# Patient Record
Sex: Male | Born: 1945 | Race: White | Hispanic: No | Marital: Married | Smoking: Never smoker
Health system: Southern US, Community
[De-identification: ages and names within clinical notes are randomized; demographics above are authoritative.]

## PROBLEM LIST (undated history)

## (undated) DIAGNOSIS — G4730 Sleep apnea, unspecified: Secondary | ICD-10-CM

## (undated) DIAGNOSIS — I509 Heart failure, unspecified: Secondary | ICD-10-CM

## (undated) DIAGNOSIS — J45909 Unspecified asthma, uncomplicated: Secondary | ICD-10-CM

## (undated) DIAGNOSIS — L408 Other psoriasis: Secondary | ICD-10-CM

## (undated) DIAGNOSIS — I2510 Atherosclerotic heart disease of native coronary artery without angina pectoris: Secondary | ICD-10-CM

## (undated) DIAGNOSIS — I350 Nonrheumatic aortic (valve) stenosis: Secondary | ICD-10-CM

## (undated) DIAGNOSIS — I10 Essential (primary) hypertension: Secondary | ICD-10-CM

## (undated) DIAGNOSIS — E785 Hyperlipidemia, unspecified: Secondary | ICD-10-CM

## (undated) DIAGNOSIS — E119 Type 2 diabetes mellitus without complications: Secondary | ICD-10-CM

## (undated) DIAGNOSIS — Q059 Spina bifida, unspecified: Secondary | ICD-10-CM

## (undated) DIAGNOSIS — M129 Arthropathy, unspecified: Secondary | ICD-10-CM

## (undated) DIAGNOSIS — K219 Gastro-esophageal reflux disease without esophagitis: Secondary | ICD-10-CM

## (undated) HISTORY — DX: Spina bifida, unspecified: Q05.9

## (undated) HISTORY — DX: Essential (primary) hypertension: I10

## (undated) HISTORY — DX: Type 2 diabetes mellitus without complications: E11.9

## (undated) HISTORY — DX: Arthropathy, unspecified: M12.9

## (undated) HISTORY — DX: Sleep apnea, unspecified: G47.30

## (undated) HISTORY — DX: Atherosclerotic heart disease of native coronary artery without angina pectoris: I25.10

## (undated) HISTORY — DX: Gastro-esophageal reflux disease without esophagitis: K21.9

## (undated) HISTORY — DX: Hyperlipidemia, unspecified: E78.5

## (undated) HISTORY — PX: CORONARY ARTERY BYPASS GRAFT: SHX141

## (undated) HISTORY — DX: Unspecified asthma, uncomplicated: J45.909

## (undated) HISTORY — DX: Other psoriasis: L40.8

## (undated) HISTORY — PX: JOINT REPLACEMENT: SHX530

## (undated) HISTORY — DX: Nonrheumatic aortic (valve) stenosis: I35.0

## (undated) HISTORY — PX: BACK SURGERY: SHX140

---

## 1998-03-03 ENCOUNTER — Other Ambulatory Visit: Admission: RE | Admit: 1998-03-03 | Discharge: 1998-03-03 | Payer: Self-pay | Admitting: Orthopedic Surgery

## 1998-11-09 ENCOUNTER — Ambulatory Visit (HOSPITAL_BASED_OUTPATIENT_CLINIC_OR_DEPARTMENT_OTHER): Admission: RE | Admit: 1998-11-09 | Discharge: 1998-11-09 | Payer: Self-pay | Admitting: Orthopedic Surgery

## 2001-12-16 ENCOUNTER — Inpatient Hospital Stay (HOSPITAL_COMMUNITY): Admission: AD | Admit: 2001-12-16 | Discharge: 2001-12-24 | Payer: Self-pay | Admitting: Cardiology

## 2001-12-20 ENCOUNTER — Encounter: Payer: Self-pay | Admitting: Thoracic Surgery (Cardiothoracic Vascular Surgery)

## 2001-12-21 ENCOUNTER — Encounter: Payer: Self-pay | Admitting: Thoracic Surgery (Cardiothoracic Vascular Surgery)

## 2001-12-22 ENCOUNTER — Encounter: Payer: Self-pay | Admitting: Thoracic Surgery (Cardiothoracic Vascular Surgery)

## 2002-02-04 ENCOUNTER — Encounter (HOSPITAL_COMMUNITY): Admission: RE | Admit: 2002-02-04 | Discharge: 2002-05-05 | Payer: Self-pay | Admitting: Cardiology

## 2002-11-10 ENCOUNTER — Encounter: Payer: Self-pay | Admitting: Internal Medicine

## 2002-11-10 ENCOUNTER — Ambulatory Visit (HOSPITAL_COMMUNITY): Admission: RE | Admit: 2002-11-10 | Discharge: 2002-11-10 | Payer: Self-pay | Admitting: Internal Medicine

## 2007-07-19 ENCOUNTER — Encounter: Admission: RE | Admit: 2007-07-19 | Discharge: 2007-07-19 | Payer: Self-pay | Admitting: Neurosurgery

## 2007-09-17 ENCOUNTER — Ambulatory Visit: Payer: Self-pay | Admitting: Cardiology

## 2007-09-20 ENCOUNTER — Encounter: Payer: Self-pay | Admitting: Cardiovascular Disease

## 2007-09-20 ENCOUNTER — Ambulatory Visit: Payer: Self-pay

## 2007-09-24 ENCOUNTER — Inpatient Hospital Stay (HOSPITAL_COMMUNITY): Admission: RE | Admit: 2007-09-24 | Discharge: 2007-09-26 | Payer: Self-pay | Admitting: Neurosurgery

## 2007-09-28 ENCOUNTER — Emergency Department (HOSPITAL_COMMUNITY): Admission: EM | Admit: 2007-09-28 | Discharge: 2007-09-28 | Payer: Self-pay | Admitting: Emergency Medicine

## 2009-08-01 ENCOUNTER — Encounter: Payer: Self-pay | Admitting: Cardiology

## 2009-08-01 ENCOUNTER — Ambulatory Visit: Payer: Self-pay | Admitting: Cardiology

## 2009-08-01 ENCOUNTER — Inpatient Hospital Stay (HOSPITAL_COMMUNITY): Admission: EM | Admit: 2009-08-01 | Discharge: 2009-08-03 | Payer: Self-pay | Admitting: Cardiology

## 2009-08-03 ENCOUNTER — Ambulatory Visit: Payer: Self-pay | Admitting: Vascular Surgery

## 2009-08-03 ENCOUNTER — Encounter: Payer: Self-pay | Admitting: Cardiovascular Disease

## 2009-09-03 ENCOUNTER — Encounter (INDEPENDENT_AMBULATORY_CARE_PROVIDER_SITE_OTHER): Payer: Self-pay | Admitting: *Deleted

## 2009-09-03 DIAGNOSIS — M129 Arthropathy, unspecified: Secondary | ICD-10-CM | POA: Insufficient documentation

## 2009-09-03 DIAGNOSIS — K219 Gastro-esophageal reflux disease without esophagitis: Secondary | ICD-10-CM

## 2009-09-03 DIAGNOSIS — G4730 Sleep apnea, unspecified: Secondary | ICD-10-CM | POA: Insufficient documentation

## 2009-09-03 DIAGNOSIS — Q059 Spina bifida, unspecified: Secondary | ICD-10-CM | POA: Insufficient documentation

## 2009-09-03 DIAGNOSIS — J45909 Unspecified asthma, uncomplicated: Secondary | ICD-10-CM | POA: Insufficient documentation

## 2009-09-03 DIAGNOSIS — R0602 Shortness of breath: Secondary | ICD-10-CM | POA: Insufficient documentation

## 2009-09-03 DIAGNOSIS — E1169 Type 2 diabetes mellitus with other specified complication: Secondary | ICD-10-CM | POA: Insufficient documentation

## 2009-09-03 DIAGNOSIS — E785 Hyperlipidemia, unspecified: Secondary | ICD-10-CM | POA: Insufficient documentation

## 2009-09-03 DIAGNOSIS — E1165 Type 2 diabetes mellitus with hyperglycemia: Secondary | ICD-10-CM | POA: Insufficient documentation

## 2009-09-03 DIAGNOSIS — E119 Type 2 diabetes mellitus without complications: Secondary | ICD-10-CM | POA: Insufficient documentation

## 2009-09-03 DIAGNOSIS — L408 Other psoriasis: Secondary | ICD-10-CM

## 2009-09-14 ENCOUNTER — Telehealth: Payer: Self-pay | Admitting: Cardiology

## 2011-02-10 LAB — POCT I-STAT 3, VENOUS BLOOD GAS (G3P V)
pCO2, Ven: 40.6 mmHg — ABNORMAL LOW (ref 45.0–50.0)
pO2, Ven: 34 mmHg (ref 30.0–45.0)

## 2011-02-10 LAB — BASIC METABOLIC PANEL
BUN: 23 mg/dL (ref 6–23)
CO2: 25 mEq/L (ref 19–32)
Calcium: 9.8 mg/dL (ref 8.4–10.5)
Chloride: 101 mEq/L (ref 96–112)
Chloride: 102 mEq/L (ref 96–112)
Creatinine, Ser: 0.97 mg/dL (ref 0.4–1.5)
Creatinine, Ser: 1 mg/dL (ref 0.4–1.5)
GFR calc Af Amer: >60 mL/min (ref >60)
GFR calc non Af Amer: >60 mL/min (ref >60)
GFR calc non Af Amer: >60 mL/min (ref >60)
Glucose, Bld: 138 mg/dL — ABNORMAL HIGH (ref 70–99)
Glucose, Bld: 183 mg/dL — ABNORMAL HIGH (ref 70–99)
Sodium: 138 mEq/L (ref 135–145)

## 2011-02-10 LAB — CBC
HCT: 42.2 % (ref 39.0–52.0)
HCT: 43.7 % (ref 39.0–52.0)
Hemoglobin: 14.2 g/dL (ref 13.0–17.0)
Hemoglobin: 14.8 g/dL (ref 13.0–17.0)
Hemoglobin: 15.1 g/dL (ref 13.0–17.0)
MCHC: 33.8 g/dL (ref 30.0–36.0)
MCV: 86.9 fL (ref 78.0–100.0)
Platelets: 346 10*3/uL (ref 150–400)
RBC: 4.86 MIL/uL (ref 4.22–5.81)
RDW: 14.3 % (ref 11.5–15.5)
RDW: 14.6 % (ref 11.5–15.5)
WBC: 13.9 10*3/uL — ABNORMAL HIGH (ref 4.0–10.5)
WBC: 9.4 10*3/uL (ref 4.0–10.5)

## 2011-02-10 LAB — GLUCOSE, CAPILLARY
Glucose-Capillary: 100 mg/dL — ABNORMAL HIGH (ref 70–99)
Glucose-Capillary: 109 mg/dL — ABNORMAL HIGH (ref 70–99)
Glucose-Capillary: 109 mg/dL — ABNORMAL HIGH (ref 70–99)
Glucose-Capillary: 132 mg/dL — ABNORMAL HIGH (ref 70–99)
Glucose-Capillary: 134 mg/dL — ABNORMAL HIGH (ref 70–99)
Glucose-Capillary: 142 mg/dL — ABNORMAL HIGH (ref 70–99)
Glucose-Capillary: 97 mg/dL (ref 70–99)

## 2011-02-10 LAB — COMPREHENSIVE METABOLIC PANEL
ALT: 32 U/L (ref 0–53)
AST: 22 U/L (ref 0–37)
Albumin: 3.8 g/dL (ref 3.5–5.2)
Chloride: 100 mEq/L (ref 96–112)
GFR calc non Af Amer: >60 mL/min (ref >60)
Glucose, Bld: 130 mg/dL — ABNORMAL HIGH (ref 70–99)
Potassium: 3.5 mEq/L (ref 3.5–5.1)
Total Bilirubin: 0.8 mg/dL (ref 0.3–1.2)

## 2011-02-10 LAB — CARDIAC PANEL(CRET KIN+CKTOT+MB+TROPI)
CK, MB: 12.9 ng/mL — ABNORMAL HIGH (ref 0.3–4.0)
Relative Index: 2.2 (ref 0.0–2.5)
Total CK: 383 U/L — ABNORMAL HIGH (ref 7–232)
Total CK: 586 U/L — ABNORMAL HIGH (ref 7–232)
Troponin I: 0.1 ng/mL — ABNORMAL HIGH (ref 0.00–0.06)

## 2011-02-10 LAB — MAGNESIUM: Magnesium: 2.2 mg/dL (ref 1.5–2.5)

## 2011-02-10 LAB — PROTIME-INR
INR: 1 (ref 0.00–1.49)
Prothrombin Time: 13 seconds (ref 11.6–15.2)

## 2011-02-10 LAB — POCT I-STAT 3, ART BLOOD GAS (G3+)
Acid-Base Excess: 1 mmol/L (ref 0.0–2.0)
O2 Saturation: 93 %

## 2011-02-10 LAB — HEPARIN LEVEL (UNFRACTIONATED): Heparin Unfractionated: 0.15 IU/mL — ABNORMAL LOW (ref 0.30–0.70)

## 2011-02-10 LAB — LIPID PANEL
Total CHOL/HDL Ratio: 5.3 RATIO
Triglycerides: 226 mg/dL — ABNORMAL HIGH (ref <150)

## 2011-02-10 LAB — HEMOGLOBIN A1C: Hgb A1c MFr Bld: 7.1 % — ABNORMAL HIGH (ref 4.6–6.1)

## 2011-02-10 LAB — D-DIMER, QUANTITATIVE: D-Dimer, Quant: 0.42 ug/mL-FEU (ref 0.00–0.48)

## 2011-03-21 NOTE — Assessment & Plan Note (Signed)
[facility]                            CARDIOLOGY OFFICE NOTE   NAME:Dylan Bruce, Dylan Bruce                       MRN:          [id number]  DATE:09/17/2007                            DOB:          1946-06-24    Mr. Leedy is a very pleasant gentleman whom I have followed in the past  for coronary artery disease, status post coronary artery bypass  grafting.  This happened in February of 2003.  His most recent Myoview  was performed in April 2005.  At that time, he was found to have an  inferolateral infarct with an ejection fraction of 57%.  There was no  ischemia.  Since I last saw him in April 2005, he has done well.  He  denies any dyspnea on exertion, orthopnea, PND, pedal edema,  palpitations, pre-syncope, syncope, or chest pain.  Note, he has had  some pain in his lower extremities and apparently needs surgery for a  disk problem.  He presented for preoperative evaluation.   MEDICATIONS:  1. Omega 3 2400 mg daily.  2. Aspirin 81 mg p.o. daily.  3. Fiberlax.  4. Multivitamin.  5. Lexapro 20 mg p.o. daily.  6. Allegra.  7. Altace 10 mg p.o. daily.  8. Nexium 40 mg p.o. daily.  9. Nabumetone 750 mg p.o. b.i.d.  10.Oxycodone.  11.TriCor 145 mg p.o. daily.  12.Zocor 80 mg p.o. daily.  13.Lyrica 100 mg p.o. t.i.d.  14.Metformin.  15.Amlodipine 5 mg p.o. daily.  16.Stool softener.  17.Orphenadrine.   PHYSICAL EXAM:  Blood pressure 147/91, pulse 74.  He weighs 245 pounds.  HEENT:  Normal.  NECK:  Supple with no bruits.  CHEST:  Clear.  CARDIOVASCULAR:  Regular rate and rhythm.  ABDOMEN:  No tenderness.  EXTREMITIES:  No edema.   Electrocardiogram shows a sinus rhythm at a rate of 75.  There was a  first degree AV block.  The axis is to the left.  There are no  significant ST changes.   DIAGNOSES:  1. Preoperative evaluation prior to back surgery - it has been 3-1/2      years since his previous Myoview.  We will risk stratify with an  adenosine Myoview, and if this shows no significant ischemia, then      I think he can proceed safely.  2. Coronary artery disease status post coronary artery bypass grafting      - he will continue on his ACE inhibitor, aspirin, and Statin.  He      has been off of his beta blockers in the past due to a history of      Wenckebach that was felt to be medication related.  3. Hypertension.  His blood pressure is elevated today.  However, he      states it runs in the 120/80 range at home.  He will track this      and, if it remains elevated, we could increase his Norvasc to 10 mg      p.o. daily.  4. Hyperlipidemia - he will continue on his Statin and we will have  his most recent lipids and liver forwarded to [place] from Dr.      Karie Chimera office.  We would also like his most recent BMET given      his ACE inhibitor use.  5. History of asthma.  6. Psoriasis.   We will see him back in 1 year or sooner if necessary.     Madolyn Frieze Jens Som, MD, [facility]  Electronically Signed    BSC/MedQ  DD: 09/17/2007  DT: 09/17/2007  Job #: [id number]   cc:   Wallace Cullens

## 2011-03-21 NOTE — Op Note (Signed)
NAME:  Dylan Bruce, Dylan Bruce                ACCOUNT NO.:  [id number]   MEDICAL RECORD NO.:  [id number]          PATIENT TYPE:  INP   LOCATION:  2899                         FACILITY:  [facility]   PHYSICIAN:  Reinaldo Meeker, M.D. DATE OF BIRTH:  17-Feb-1946   DATE OF PROCEDURE:  09/24/2007  DATE OF DISCHARGE:                               OPERATIVE REPORT   PREOPERATIVE DIAGNOSIS:  Spinal stenosis with spondylosis and a  herniated disk at L4-5 right.   POSTOPERATIVE DIAGNOSIS:  Spinal stenosis with spondylosis and a  herniated disk at L4-5 right.   PROCEDURE:  Right L4-5 transverse lumbar interbody fusion for  decompression of L4 and L5 nerve roots with PEEK interbody cage followed  by a left L4-5 transfacet screw fixation and L4-5 spinous process plate  fixation followed by left L4-5 posterior and lateral fusion.   SURGEON:  Reinaldo Meeker, M.D.   ASSISTANT:  Kathaleen Maser. Pool, M.D.   PROCEDURE IN DETAIL:  After being placed in the prone position, the  patient's back was prepped and draped in the usual sterile fashion.  A  localizing x-ray was taken prior to incision to identify the appropriate  level.  A midline incision was made above the spinous process of L3, L4  and L5.  Using Bovie cutting current, the incision was carried down to  the spinous processes and subperiosteal dissection was then carried out  bilaterally over the spinous processes lamina facet joint of L4 and L5  and also along the lamina of L3 on the right.  In the process of taking  down the soft tissue, a tiny pin-hole defect was encountered in the  dura.  This was sealed at the end of the case with Tisseel.   At this time, a self-retaining retractor was  placed for exposure and an  x-ray showed approach to the appropriate level.  On the right side, a  generous laminotomy was performed by removing the inferior edge of the  L3 lamina, the entire L4 lamina, superior one-half of the L5 lamina.  L4  and L5 nerve roots were  followed out generously and foraminal  decompression was carried out, particularly on the right at L4-5.  The  disk was found to be markedly herniated and this was thoroughly cleaned  out with a variety of pituitary rongeurs and curets.  At this time,  inspection was carried out for evidence of further neural compression,  none could be identified.  The disk space was then prepared for  transverse lumbar interbody fusion.  It was distracted up to an 8-mm  size, then a variety of instruments were used to decorticate the end  plates.  Prior to placing the PEEK interbody spacer, autologous bone,  Actifuse and BMP were placed deep within the interspace.  A PEEK cage  filled with the same combination was then placed without difficulty and  fluoroscopy showed it to be in good position.  At this time, transfacet  screw fixation was carried out on the patient's left side.  The drill  was used for a starting point and drill over a  guide wire was then  passed to the inferior facet of L4 and into the pedicle of L5.  This was  followed under AP and lateral fluoroscopy and with imaging monitor we  showed no evidence of pedicle breech.  Tapping was then carried out,  followed by placement of a 25-mm screw with a washer.  This was followed  into good position and tested again with the MG testing and showed no  evidence of neural stimulation.  At this time, a spinous process plate  was placed without difficulty.  It was clamped down without difficulty  and then the post cut off to keep it more flush to the spinous  processes.  AP and lateral fluoroscopy at this time showed excellent  placement of the instrumentation and interbody spacer.  High speed drill  was used to decorticate the lamina facet joint of L4-5 on the left and  then BMP autologous bone and Actifuse were placed down for  posterolateral fusion.  This was done after large amounts of irrigation  had been carried out.  Tisseel was then placed  over the tiny pin hole  defect of the dura.  Gelfoam was placed over the  dural exposure on the right.  It was then closed in multiple layers of  Vicryl in the muscle fascia, subcutaneous and subcuticular tissues.  A  Hemovac drain was left in the epidural space to help evacuate the excess  BMP.  A sterile dressing was then applied and the patient was extubated  and taken to the recovery room in stable condition.           ______________________________  Reinaldo Meeker, M.D.     ROK/MEDQ  D:  09/24/2007  T:  09/24/2007  Job:  [id number]

## 2011-03-24 NOTE — Procedures (Signed)
. [facility]  Patient:    Dylan Bruce, Dylan Bruce Visit Number: [id number] MRN: [id number]          Service Type: MED Location: 2000 2007 01 Attending Physician:  Junious Silk Dictated by:   Doylene Canning. Ladona Ridgel, M.D. [facility] Proc. Date: 12/18/01 Admit Date:  12/16/2001   CC:         Dr. Loraine Leriche ____________, [facility] C[facility], M.D. [facility]   Procedure Report  PROCEDURE PERFORMED:  Left heart catheterization with coronary angiography and left ventriculography.  INDICATION:  Prior nonQ-wave myocardial infarction.  I. INTRODUCTION:  The patient is a very pleasant 65 year old man, who has a history of hypertension, hypertriglyceridemia, asthma, and gastroesophageal reflux disease, who was admitted to the hospital approximately one week ago (outlying hospital) with chest pain and subsequently ruled in for a nonQ-wave myocardial infarction.  His hospital course was unremarkable and he was referred to Dr. Jens Som and subsequently referred for heart catheterization.  II. PROCEDURE:  After informed consent was obtained, the patient was taken to the diagnostic electrophysiology laboratory in the fasting state.  After usual preparation and draping, approximately 30 cc of lidocaine was infiltrated into the left infraclavicular region.  The right femoral artery was subsequently cannulated and a hemostatic sheath was placed.  The left Judkins catheter was inserted and coronary angiography of the left main system was then carried out.  The left Judkins catheter was removed and the right Judkins catheter was inserted and coronary angiography of the right coronary system was carried out.  The right Judkins catheter was removed and the pigtail catheter was inserted retrograde across the aortic valve and a left ventriculography in the RAO projection with a total of 30 cc of contrast was performed.  At this point, the catheters were removed, hemostasis  assured, and the patient was returned to his room in satisfactory condition.  III. COMPLICATIONS:  None.  IV. RESULTS:  A. HEMODYNAMICS:  The left ventricular pressure was 101/21 and the aortic pressure was 101/67.  B. LEFT VENTRICULOGRAPHY:  The left ventriculography was performed in the RAO projection demonstrated preserved LV systolic function with an estimated ejection fraction of 55%.  C. CORONARY ANGIOGRAPHY:  The left main coronary artery gave rise to the LAD and left circumflex arteries.  The left anterior descending artery was occluded just after its first diagonal branch.  The second diagonal branch was not visualized.  The first diagonal branch had an 80% stenosis.  The left circumflex gave rise to two obtuse marginal branches.  The first and the dominant branch had a 90% proximal stenosis and a 99% branch vessel stenosis. The right coronary artery was a dominant vessel giving rise to the PDA.  There was a 70% mid stenosis and a 60% distal stenosis.  There were collaterals from the right coronary artery vessel to the LAD.  V. CONCLUSION:  This study demonstrated severe 3-vessel disease with an occluded left anterior descending coronary artery, 80% first diagonal branch, 90% first obtuse marginal branch, 70% right coronary artery stenosis all in the setting of preserved LV systolic function following a recent nonQ-wave myocardial infarction.  RECOMMENDATIONS:  Obtain a consultation with a cardiovascular surgeon. Dictated by:   Doylene Canning. Ladona Ridgel, M.D. [facility] Attending Physician:  Junious Silk DD:  12/18/01 TD:  12/18/01 Job: [id number]

## 2011-03-24 NOTE — Op Note (Signed)
[facility]  Patient:    Dylan Bruce, Dylan Bruce Visit Number: [id number] MRN: [id number]          Service Type: MED Location: 2300 [phone] Attending Physician:  Tressie Stalker Dictated by:   Salvatore Decent. Cornelius Moras, M.D. Proc. Date: 12/20/01 Admit Date:  12/16/2001   CC:         Dylan Bruce. Jens Som, M.D. [facility]  Dylan Bruce. Dylan Bruce, M.D. [facility]  Wallace Cullens, M.D., [place], [place]   Operative Report  PREOPERATIVE DIAGNOSIS:  Severe three-vessel coronary artery disease, status post acute non-Q-wave myocardial infarction.  POSTOPERATIVE DIAGNOSIS:  Severe three-vessel coronary artery disease, status post acute non-Q-wave myocardial infarction.  PROCEDURES:  Median sternotomy for coronary artery bypass grafting x5 (left internal mammary artery to distal left anterior descending coronary artery, saphenous vein graft to first diagonal branch, saphenous vein graft to medial sub-branch of large circumflex marginal branch and sequential saphenous vein graft to lateral sub-branch of large circumflex marginal branch, saphenous vein graft to posterior descending coronary artery).  SURGEON:  Salvatore Decent. Cornelius Moras, M.D.  ASSISTANT:  Adair Patter, P.A.  ANESTHESIA:  General.  BRIEF CLINICAL NOTE:  The patient is a 65 year old mildly obese white male from [place], [place], followed by Dr. Wallace Cullens and referred by Dr. Olga Millers and Dr. Lewayne Bunting for management of coronary artery disease.  The patient has history of hypertension, hyperlipidemia, GE reflux disease, obstructive sleep apnea, and newly-diagnosed borderline type 2 diabetes mellitus.  The patient was admitted on December 11, 2001, to [facility] in [place] with an acute non-Q-wave myocardial infarction.  He was initially treated medically and subsequently referred to Dr. Jens Som.  [name] underwent elective cardiac catheterization by Dr. Ladona Bruce demonstrating severe three-vessel coronary  artery disease with relatively preserved left ventricular function.  OPERATIVE CONSENT:  The patient and his wife have been counseled at length regarding the indications and potential benefits of coronary artery bypass grafting.  Alternative treatment strategies have been discussed.  They understand and accept all associated risks of surgery, including but not limited to risk of death, stroke, myocardial infarction, bleeding requiring blood transfusion, arrhythmia, infection, and recurrent coronary artery disease.  All of their questions have been addressed.  DESCRIPTION OF PROCEDURE:  The patient is brought to the operating room on the above-mentioned date, and invasive hemodynamic monitoring is established by the anesthesia service under the care and direction of Bedelia Person, M.D. Specifically, a Swan-Ganz catheter is placed through the right internal jugular approach.  A right radial arterial line is placed.  Intravenous antibiotics are administered.  The patient is placed in the supine position on the operating table.  A Foley catheter is placed following induction of general endotracheal anesthesia.  The patients chest, abdomen, both groins, and both lower extremities are prepared and draped in a sterile manner.  A median sternotomy incision is performed, and the left internal mammary artery is dissected from the chest wall and prepared for bypass grafting.  The left internal mammary artery is notably good-quality conduit.  Simultaneously saphenous vein is obtained from the patients left lower leg through a longitudinal incision.  Approximately two-thirds of the way up the left lower leg, the vein is noted to bifurcate and become somewhat small in caliber. Therefore, an additional segment of saphenous vein is obtained from the patients right lower leg through a longitudinal incision.  The patient is heparinized systemically.  The pericardium is opened.  The ascending aorta is  inspected.  The patient has a very large heart, and due to his relatively large chest, the aorta is very foreshortened and somewhat difficult to expose.  However, the aorta is normal in appearance during the short distance that it can be visualized.  The ascending aorta is cannulated for cardiopulmonary bypass.  A venous cannula is placed through the right atrial appendage.  Adequate heparinization is verified.  Cardiopulmonary bypass is begun, and the surface of the heart is inspected.  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  There is diffuse distal coronary artery disease with relatively poor targets for grafting.  Distal sites are selected for coronary bypass grafting.  Portions of saphenous vein and the left internal mammary artery are all trimmed to appropriate lengths.  A temperature probe is placed in the left ventricular septum, and a Styrofoam pad is placed to protect the left phrenic nerve from thermal injury.  A cardioplegia catheter is placed in the ascending aorta.  The patient is cooled to 32 degrees systemic temperature.  The aortic crossclamp is applied, and cardioplegia is delivered in an antegrade fashion through the aortic root.  Iced saline slush is applied for topical hypothermia.  The initial cardioplegic arrest and myocardial cooling are felt to be excellent.  Repeat doses of cardioplegia are administered intermittently throughout the crossclamp portion of the operation both through the aortic root and down subsequently-placed vein grafts to maintain septal temperature below 15 degrees Centigrade.  The following distal coronary anastomoses are performed:  (1) The posterior descending coronary artery is grafted with a saphenous vein graft in an end-to-side fashion.  This coronary is a very  poor-quality target.  It measures 1.1 mm in diameter.  It is grafted  just after the bifurcation of the distal right coronary artery.  The distal right coronary artery is severely diseased and chronically occluded proximally.  (2) The circumflex marginal branch is grafted using a sequential graft with a side-to-side anastomosis to the medial sub-branch of the large first circumflex marginal branch.  This circumflex marginal branch is a very large branch which bifurcates into a medial and lateral sub-branch.  The medial sub-branch is chronically occluded proximally.  It measures 1.6 mm at the site of distal bypass and is of fair quality.  (3) The lateral sub-branch of the circumflex marginal branch is grafted using the sequential saphenous vein graft off of the medial sub-branch.  This coronary measures 1.5 mm in diameter and is of good quality.  (4) The diagonal branch off the left anterior descending coronary is grafted with a saphenous vein graft in an end-to-side fashion.  This coronary measures 1.5 mm in diameter and is of fair quality. (5) The distal left anterior descending coronary is grafted with the left internal mammary artery in an end-to-side fashion.  This coronary measures 1.5 mm at the site of distal bypass and is of fair to poor quality.  It is diffusely diseased.  All three proximal saphenous vein anastomoses are performed directly to the ascending aorta prior to removal of the aortic crossclamp.  The septal temperature is noted to rise rapidly and dramatically upon reperfusion of the left internal mammary artery.  The aortic crossclamp is removed after a total crossclamp time of 102 minutes.  The heart begins to beat spontaneously without need for cardioversion.  All proximal and distal anastomoses are inspected for hemostasis and appropriate graft orientation.  Epicardial pacing wires are affixed to the  right ventricular outflow tract and to the right atrial appendage.  The patient is rewarmed to greater than 37 degrees Centigrade  temperature.  The patient is weaned from cardiopulmonary bypass without difficulty.  The patients rhythm at separation from bypass is normal sinus rhythm.  No inotropic support is required.  Total cardiopulmonary bypass time for the operation is 135 minutes.  The venous and arterial cannulae are removed uneventfully.  Protamine is administered to reverse the anticoagulation.  The mediastinum and the left chest are irrigated with saline solution containing vancomycin.  Meticulous surgical hemostasis is ascertained.  The mediastinum and the left chest are drained with three chest tubes placed through separate stab incisions inferiorly.  The median sternotomy is closed in routine fashion.  Both lower extremity incisions are closed in multiple layers in routine fashion.  All skin incisions are closed with subcuticular skin closures.  The patient tolerated the procedure well and is transported to the surgical intensive care unit in stable condition.  There are no intraoperative complications.  All sponge, instrument, and needle counts are verified correct at completion of the operation.  No blood products were administered. Dictated by:   Salvatore Decent Cornelius Moras, M.D. Attending Physician:  Tressie Stalker DD:  12/20/01 TD:  12/21/01 Job: [id number]

## 2011-03-24 NOTE — Discharge Summary (Signed)
[place]. [facility]  Patient:    Dylan Bruce, Dylan Bruce. Visit Number: [id number] MRN: [id number]          Service Type: Attending:  Salvatore Decent. Cornelius Moras, M.D. Dictated by:   Maxwell Marion, CRNFA Adm. Date:  12/16/01 Disc. Date: 12/24/01   CC:         Wallace Cullens, M.D.; [place], [place]  Madolyn Frieze. Jens Som, M.D. [facility]   Discharge Summary  DATE OF BIRTH:  [birth year]  DATE OF SURGERY:  December 20, 2001.  ADMITTING DIAGNOSIS:  Coronary artery disease, status post subendocardial myocardial infarction.  PAST MEDICAL HISTORY: 1. Hypertension. 2. Hyperlipidemia. 3. Elevated triglycerides. 4. Psoriatic arthritis. 5. Asthma. 6. Gastroesophageal reflux disease. 7. Diverticulosis. 8. Sleep apnea.  ALLERGIES: 1. MINOCYCLINE AND TETRACYCLINE cause a rash. 2. SEPTRA DS causes urticaria. 3. ALLOPURINOL causes pruritus. 4. PROBENECID causes a rash.  DISCHARGE DIAGNOSES: 1. Severe three vessel coronary artery disease, status post coronary    artery bypass grafting. 2. New diagnosis of diabetes mellitus type 2.  BRIEF HISTORY:  Dylan Bruce is a 65 year old Caucasian man who presented to [facility] emergency room on February 5.  He presented with complaints of chest pain accompanied by weakness and dyspnea.  His pain was relieved in the ER with nitroglycerin.  He eventually ruled in for SEMI by cardiac enzymes (peak CK-MB was 109.4, troponin 8.01).  He was treated medically and discharged home on February 7 with instructions to follow up with [facility] cardiology service for a cardiac catheterization.  HOSPITAL COURSE:  On February 10, Mr. Clopper was admitted to [facility] in the care of [facility] to the cardiology service and Dr. Jens Som.  On February 12, she underwent a cardiac catheterization which revealed severe three vessel coronary artery disease and ejection fraction of approximately 55%.  Cardiac surgery consult was  requested from [facility] and Dr. Tressie Stalker evaluated the patient.  After examination of the patient, review of the records including the catheterization films, he recommended coronary artery bypass grafting as the preferred treatment choice for this patient. The procedure risks and benefits were discussed with the patient and he is ready to proceed.  On February 13, Doppler studies were performed which revealed no significant carotid artery disease.  He was noted to have palpable pedal pulses bilaterally.  On February 14, Dylan Bruce underwent an uncomplicated coronary artery bypass grafting x5 with Dr. Tressie Stalker. Grafts placed at the time of the procedure with left internal mammary grafted to the distal left anterior descending artery, saphenous vein graft to the first diagonal artery, saphenous vein graft in a sequential fashion to the first circumflex and second circumflex arteries, saphenous vein graft to the posterior descending artery.  Vein for the bypass was harvested from both the right and left lower extremities.  [name] tolerated the procedure well and was transferred in stable condition to the SICU.  Mr. Bellew has remained hemodynamically stable since surgery.  His postoperative course has been uneventful and he made very good progress in recovering.  The morning of February 18, his vital signs were stable and he was afebrile.  His incisions are healing well.  His p.o. intake is adequate. His glycemic control is acceptable.  He is ready for discharge home on the morning of December 24, 2001.  LABORATORY STUDIES:  HBA1C 7.2 on admission.  Postoperative CBGs were less than 150 and CBG on February 17 was less than 125.  On February 16, WBC is  12.9, hemoglobin 10.3, hematocrit 29.1.  Potassium 3.9, BUN 19, creatinine 1.2.  On February 13, his lipid profile showed total cholesterol of 184, triglycerides 232, HDL 21, VLDL 46, LDL 117.  CONDITION ON DISCHARGE:   Improved.  INSTRUCTIONS ON DISCHARGE: 1. Activity: He has now been instructed to continue his breathing exercises    and daily walks.  He is to refrain from any driving or any heavy lifting,    pushing, pulling; nothing over 10 pounds. 2. His diet should be a low-fat, low-salt, no concentrated sweets. 3. Wound care: He may shower at home. 4. He has been instructed to call Dr. Barry Dienes office if his incisions are    red, hot, swollen, draining, or if he has any fever over 101 degrees    Fahrenheit. 5. He has also been instructed to continue using his BiPAP machine when    he is sleeping.  MEDICATIONS ON DISCHARGE: 1. Enteric-coated aspirin 325 mg p.o. q.d. 2. Altace 1.25 mg p.o. q.d. 3. Lopressor 50 mg p.o. b.i.d. 4. Percocet 1-2 p.o. q.4-6h p.r.n. for pain. 5. He has been instructed to resume the following home medications:    Paxil 20 mg p.o. q.d.; gemfibrozil 600 mg p.o. b.i.d.; Prilosec 20 mg    p.o. q.d.; Neurontin 300 mg p.o. q.d.; Vioxx as he was taking prior    to admission.  FOLLOWUP: 1. He has an appointment to see Dr. Jens Som in his office on March 3 at    9:15 and he will have a chest x-ray taken at that time. 2. He has an appointment to see Dr. Cornelius Moras on March 10 at 10:30 in the    morning. 3. He has been asked to follow up with his primary care Avondre Richens, Dr.    Omer Jack within one month specifically to follow his diabetes mellitus.Dictated by:   Maxwell Marion, CRNFA Attending:  Salvatore Decent. Cornelius Moras, M.D. DD:  03/07/02 TD:  03/10/02 Job: [id number]

## 2011-08-15 LAB — URINALYSIS, ROUTINE W REFLEX MICROSCOPIC
Glucose, UA: 250 — AB
Ketones, ur: NEGATIVE
Nitrite: NEGATIVE
Protein, ur: NEGATIVE
Urobilinogen, UA: 1

## 2011-08-15 LAB — BASIC METABOLIC PANEL
BUN: 22
Calcium: 10.5
Chloride: 101
Creatinine, Ser: 1.04
GFR calc Af Amer: >60
GFR calc non Af Amer: >60

## 2011-08-15 LAB — HEPATIC FUNCTION PANEL
ALT: 31
AST: 25
Albumin: 4.6
Alkaline Phosphatase: 52
Indirect Bilirubin: 0.5
Total Protein: 8.2

## 2011-08-15 LAB — CBC
MCV: 85.6
Platelets: 407 — ABNORMAL HIGH
RBC: 5.58
WBC: 9.6

## 2011-08-15 LAB — ABO/RH: ABO/RH(D): A POS

## 2011-08-15 LAB — TYPE AND SCREEN

## 2012-07-16 ENCOUNTER — Encounter: Payer: Self-pay | Admitting: *Deleted

## 2012-07-16 ENCOUNTER — Encounter: Payer: Self-pay | Admitting: Cardiology

## 2012-07-17 ENCOUNTER — Ambulatory Visit (HOSPITAL_COMMUNITY): Payer: 59 | Attending: Cardiology | Admitting: Radiology

## 2012-07-17 ENCOUNTER — Ambulatory Visit (INDEPENDENT_AMBULATORY_CARE_PROVIDER_SITE_OTHER): Payer: PRIVATE HEALTH INSURANCE | Admitting: Cardiology

## 2012-07-17 ENCOUNTER — Encounter: Payer: Self-pay | Admitting: Cardiology

## 2012-07-17 VITALS — BP 104/70 | HR 74 | Ht 75.0 in | Wt 231.0 lb

## 2012-07-17 DIAGNOSIS — I25810 Atherosclerosis of coronary artery bypass graft(s) without angina pectoris: Secondary | ICD-10-CM

## 2012-07-17 DIAGNOSIS — R011 Cardiac murmur, unspecified: Secondary | ICD-10-CM | POA: Insufficient documentation

## 2012-07-17 DIAGNOSIS — I2510 Atherosclerotic heart disease of native coronary artery without angina pectoris: Secondary | ICD-10-CM

## 2012-07-17 DIAGNOSIS — I10 Essential (primary) hypertension: Secondary | ICD-10-CM

## 2012-07-17 DIAGNOSIS — I379 Nonrheumatic pulmonary valve disorder, unspecified: Secondary | ICD-10-CM | POA: Insufficient documentation

## 2012-07-17 DIAGNOSIS — I359 Nonrheumatic aortic valve disorder, unspecified: Secondary | ICD-10-CM | POA: Insufficient documentation

## 2012-07-17 DIAGNOSIS — E785 Hyperlipidemia, unspecified: Secondary | ICD-10-CM

## 2012-07-17 DIAGNOSIS — R0602 Shortness of breath: Secondary | ICD-10-CM

## 2012-07-17 DIAGNOSIS — I369 Nonrheumatic tricuspid valve disorder, unspecified: Secondary | ICD-10-CM | POA: Insufficient documentation

## 2012-07-17 HISTORY — DX: Essential (primary) hypertension: I10

## 2012-07-17 NOTE — Assessment & Plan Note (Signed)
Continue aspirin but discontinue Plavix. Continue statin. Plan Myoview for risk stratification. Patient also with apical murmur on examination. Schedule echocardiogram to rule out mitral regurgitation.

## 2012-07-17 NOTE — Progress Notes (Signed)
Echocardiogram performed.  

## 2012-07-17 NOTE — Patient Instructions (Addendum)
Your physician recommends that you schedule a follow-up appointment in: 3 MONTHS WITH DR Jens Som  Your physician has requested that you have en exercise stress myoview. For further information please visit [email or web address] Please follow instruction sheet, as given.   Your physician has requested that you have an echocardiogram. Echocardiography is a painless test that uses sound waves to create images of your heart. It provides your doctor with information about the size and shape of your heart and how well your heart's chambers and valves are working. This procedure takes approximately one hour. There are no restrictions for this procedure.

## 2012-07-17 NOTE — Progress Notes (Signed)
HPI: pleasant male for followup of coronary artery disease. He is status post coronary artery bypassing graft in 2003. His most recent cardiac catheterization was performed in 2010. The patient had no disease in his left main. The LAD was occluded. There was no significant disease in the circumflex. The right coronary was occluded. The LIMA to the LAD was patent. Saphenous vein graft to the diagonal was occluded. The saphenous vein graft to the PDA had an 80% stenosis followed by a 40% lesion. The saphenous vein graft to the first and second marginals was patent. Ejection fraction was normal. The patient had PCI of the saphenous vein graft to PDA at that time. I have not seen him since then. Note ABIs in September of 2010 were normal. Echocardiogram in September 2010 showed normal LV function. There was mild left atrial enlargement. There was mild right atrial enlargement. Patient has also had Mobitz 1 with beta-blockade in the past. Patient has mild dyspnea on exertion but no orthopnea, PND, pedal edema, chest pain or syncope. Approximately 5 days ago during sex he noticed increased dyspnea. He was concerned about these symptoms. No problems since.  Current Outpatient Prescriptions  Medication Sig Dispense Refill  . AMLODIPINE BESYLATE PO Take by mouth daily.      Marland Kitchen aspirin 325 MG tablet Take 325 mg by mouth daily.      Marland Kitchen CLONAZEPAM PO Take by mouth daily.      . clopidogrel (PLAVIX) 75 MG tablet Take 75 mg by mouth daily.      . cyclobenzaprine (FLEXERIL) 5 MG tablet Take 5 mg by mouth daily.      Marland Kitchen esomeprazole (NEXIUM) 40 MG capsule Take 40 mg by mouth daily before breakfast.      . ezetimibe (ZETIA) 10 MG tablet Take 10 mg by mouth daily.      . fenofibrate (TRICOR) 145 MG tablet Take 145 mg by mouth daily.      . GLYBURIDE PO Take by mouth 2 (two) times daily.      . Liraglutide (VICTOZA [place]) Inject into the skin daily.      Marland Kitchen lisinopril (PRINIVIL,ZESTRIL) 10 MG tablet Take 10 mg by mouth  daily.      . metFORMIN (GLUCOPHAGE) 1000 MG tablet Take 1,000 mg by mouth 2 (two) times daily with a meal.      . Multiple Vitamins-Minerals (MULTIVITAMIN PO) Take by mouth.      . nabumetone (RELAFEN) 500 MG tablet Take 500 mg by mouth 2 (two) times daily.      Marland Kitchen oxyCODONE-acetaminophen (PERCOCET) 10-325 MG per tablet Take 1 tablet by mouth daily.      . Pregabalin (LYRICA PO) Take by mouth 2 (two) times daily.      . simvastatin (ZOCOR) 40 MG tablet Take 40 mg by mouth every evening.         Past Medical History  Diagnosis Date  . DM   . HYPERLIPIDEMIA   . ASTHMA   . GERD   . PSORIASIS   . ARTHRITIS   . SPINA BIFIDA   . SLEEP APNEA   . Hypertension   . CAD (coronary artery disease)     Past Surgical History  Procedure Date  . Coronary artery bypass graft     History   Social History  . Marital Status: Married    Spouse Name: N/A    Number of Children: N/A  . Years of Education: N/A   Occupational History  . Not on  file.   Social History Main Topics  . Smoking status: Never Smoker   . Smokeless tobacco: Not on file  . Alcohol Use: Not on file  . Drug Use: Not on file  . Sexually Active: Not on file   Other Topics Concern  . Not on file   Social History Narrative  . No narrative on file    ROS: no fevers or chills, productive cough, hemoptysis, dysphasia, odynophagia, melena, hematochezia, dysuria, hematuria, rash, seizure activity, orthopnea, PND, pedal edema, claudication. Remaining systems are negative.  Physical Exam: Well-developed well-nourished in no acute distress.  Skin is warm and dry.  HEENT is normal.  Neck is supple.  Chest is clear to auscultation with normal expansion.  Cardiovascular exam is regular rate and rhythm.  Abdominal exam nontender or distended. No masses palpated. Extremities show no edema. neuro grossly intact  ECG sinus rhythm at a rate of 74. First degree AV block. Normal axis. Lateral T-wave inversion.

## 2012-07-17 NOTE — Assessment & Plan Note (Signed)
Continue statin. Lipids and liver monitored by primary care. 

## 2012-07-17 NOTE — Assessment & Plan Note (Signed)
Blood pressure controlled. Continue present medications. 

## 2012-07-23 ENCOUNTER — Encounter (HOSPITAL_COMMUNITY): Payer: PRIVATE HEALTH INSURANCE

## 2012-07-30 ENCOUNTER — Encounter (HOSPITAL_COMMUNITY): Payer: PRIVATE HEALTH INSURANCE

## 2012-08-05 ENCOUNTER — Ambulatory Visit (HOSPITAL_COMMUNITY): Payer: 59 | Attending: Cardiology | Admitting: Radiology

## 2012-08-05 ENCOUNTER — Other Ambulatory Visit: Payer: Self-pay | Admitting: *Deleted

## 2012-08-05 VITALS — Ht 73.0 in | Wt 236.0 lb

## 2012-08-05 DIAGNOSIS — I10 Essential (primary) hypertension: Secondary | ICD-10-CM | POA: Insufficient documentation

## 2012-08-05 DIAGNOSIS — Z8249 Family history of ischemic heart disease and other diseases of the circulatory system: Secondary | ICD-10-CM | POA: Insufficient documentation

## 2012-08-05 DIAGNOSIS — E119 Type 2 diabetes mellitus without complications: Secondary | ICD-10-CM | POA: Insufficient documentation

## 2012-08-05 DIAGNOSIS — I2510 Atherosclerotic heart disease of native coronary artery without angina pectoris: Secondary | ICD-10-CM

## 2012-08-05 DIAGNOSIS — R0602 Shortness of breath: Secondary | ICD-10-CM

## 2012-08-05 DIAGNOSIS — I25810 Atherosclerosis of coronary artery bypass graft(s) without angina pectoris: Secondary | ICD-10-CM

## 2012-08-05 DIAGNOSIS — R0989 Other specified symptoms and signs involving the circulatory and respiratory systems: Secondary | ICD-10-CM | POA: Insufficient documentation

## 2012-08-05 DIAGNOSIS — I441 Atrioventricular block, second degree: Secondary | ICD-10-CM

## 2012-08-05 DIAGNOSIS — R0609 Other forms of dyspnea: Secondary | ICD-10-CM | POA: Insufficient documentation

## 2012-08-05 MED ORDER — TECHNETIUM TC 99M SESTAMIBI GENERIC - CARDIOLITE
33.0000 | Freq: Once | INTRAVENOUS | Status: AC | PRN
  Administered 2012-08-05: 33 via INTRAVENOUS

## 2012-08-05 MED ORDER — TECHNETIUM TC 99M SESTAMIBI GENERIC - CARDIOLITE
11.0000 | Freq: Once | INTRAVENOUS | Status: AC | PRN
  Administered 2012-08-05: 11 via INTRAVENOUS

## 2012-08-05 NOTE — Progress Notes (Signed)
[facility] SITE 3 NUCLEAR MED [address] [id number] [place] [address] [phone]  Cardiology Nuclear Med Study  Dylan Bruce is a 66 y.o. male     MRN : [id number]     DOB: 1946-01-13  Procedure Date: 08/05/2012  Nuclear Med Background Indication for Stress Test:  Evaluation for Ischemia, PTCA/Stent and Graft Patency History:  '03 SEMI>CABG; '08 FIE:PPIR-JJOACZY ischemia, EF=56%; 9/10 PTCA/Stent-SVG>PDA, other grafts patent, EF=65%; '10 Echo:EF=65%; h/o [place] Cardiac Risk Factors: Family History - CAD, Hypertension, Lipids, NIDDM and Overweight  Symptoms:  DOE   Nuclear Pre-Procedure Caffeine/Decaff Intake:  None> 12 hrs NPO After: 8:00pm   Lungs:  Clear. O2 Sat: 96% on room air. IV 0.9% NS with Angio Cath:  20g  IV Site: R Antecubital x 1, tolerated well IV Started by:  Irean Hong, RN  Chest Size (in):  48 Cup Size: n/a  Height: 6\' 1"  (1.854 m)  Weight:  236 lb (107.049 kg)  BMI:  Body mass index is 31.14 kg/(m^2). Tech Comments:  Held Metformin, and Glyburide this am    Nuclear Med Study 1 or 2 day study: 1 day  Stress Test Type:  Stress  Reading MD: Cassell Clement, MD  Order Authorizing Provider:  Olga Millers, MD  Resting Radionuclide: Technetium 39m Sestamibi  Resting Radionuclide Dose: 11.0 mCi   Stress Radionuclide:  Technetium 75m Sestamibi  Stress Radionuclide Dose: 33.0 mCi           Stress Protocol Rest HR: 59 Stress HR: 146  Rest BP: 144/87 Stress BP: 190/92  Exercise Time (min): 6:45 METS: 7.0   Predicted Max HR: 156 bpm % Max HR: 93.59 bpm Rate Pressure Product: 60630   Dose of Adenosine (mg):  n/a Dose of Lexiscan: n/a mg  Dose of Atropine (mg): n/a Dose of Dobutamine: n/a mcg/kg/min (at max HR)  Stress Test Technologist: Smiley Houseman, CMA-N  Nuclear Technologist:  Domenic Polite, CNMT     Rest Procedure:  Myocardial perfusion imaging was performed at rest 45 minutes following the intravenous  administration of Technetium 29m Sestamibi.  Rest ECG: 2nd degree AVB-type I with nonspecific T-wave changes.  Stress Procedure:  The patient exercised on the treadmill utilizing the Bruce protocol for 6:45 minutes. He then stopped due to fatigue and denied any chest pain.  There were no diagnostic ST-T wave changes, a 3-beat run of v-tach was noted in recovery.  He had a significant drop in BP with peak exercise.  He returned to 2nd degree AVB-type I in recovery.  Technetium 41m Sestamibi was injected at peak exercise and myocardial perfusion imaging was performed after a brief delay.  EKG's and images were discussed with Dr. Jens Som and he ordered a 48-hour Holter monitor for patient.  Stress ECG: No significant change from baseline ECG. 3 beats of VT in recovery.  QPS Raw Data Images:  Normal; no motion artifact; normal heart/lung ratio. Stress Images:  There is decreased uptake in the lateral wall. Rest Images:  There is decreased uptake in the lateral wall. Subtraction (SDS):  There is a fixed defect that is most consistent with a previous infarction. Transient Ischemic Dilatation (Normal <1.22):  1.14 Lung/Heart Ratio (Normal <0.45):  0.37  Quantitative Gated Spect Images QGS EDV:  205 ml QGS ESV:  103 ml  Impression Exercise Capacity:  Fair exercise capacity. BP Response:  Normal blood pressure response. Clinical Symptoms:  No significant symptoms noted. ECG Impression:  No significant ST segment change suggestive of ischemia.  Comparison with Prior Nuclear Study: No images to compare  Overall Impression:  Abnormal stress nuclear study. There is evidence of a moderate sized infarct of moderate severity involving the apical lateral, mid-inferolateral and basal inferolateral as well as mid-anterolateral and basal anterolateral segments.  There is partial reversibility.  LV Ejection Fraction: 51%.  LV Wall Motion:  The LV cavity appears to be enlarged. No definite segmental wall  motion abnormalities.  Cassell Clement

## 2012-08-07 ENCOUNTER — Telehealth: Payer: Self-pay | Admitting: Cardiology

## 2012-08-07 NOTE — Telephone Encounter (Signed)
Patient returning nurse call he can be reached at [phone]

## 2012-08-07 NOTE — Telephone Encounter (Signed)
Spoke with pt, aware of nuclear results. Follow up appt scheduled.

## 2012-08-14 ENCOUNTER — Ambulatory Visit (INDEPENDENT_AMBULATORY_CARE_PROVIDER_SITE_OTHER): Payer: PRIVATE HEALTH INSURANCE | Admitting: Cardiology

## 2012-08-14 ENCOUNTER — Encounter: Payer: Self-pay | Admitting: Cardiology

## 2012-08-14 VITALS — BP 137/79 | HR 57 | Wt 231.0 lb

## 2012-08-14 DIAGNOSIS — R001 Bradycardia, unspecified: Secondary | ICD-10-CM | POA: Insufficient documentation

## 2012-08-14 DIAGNOSIS — I441 Atrioventricular block, second degree: Secondary | ICD-10-CM

## 2012-08-14 DIAGNOSIS — I350 Nonrheumatic aortic (valve) stenosis: Secondary | ICD-10-CM | POA: Insufficient documentation

## 2012-08-14 MED ORDER — NITROGLYCERIN 0.4 MG SL SUBL: 0.4000 mg | SUBLINGUAL_TABLET | SUBLINGUAL | Status: DC | PRN

## 2012-08-14 NOTE — Assessment & Plan Note (Signed)
Continue statin. 

## 2012-08-14 NOTE — Assessment & Plan Note (Signed)
Patient will need followup echocardiograms in the future. 

## 2012-08-14 NOTE — Assessment & Plan Note (Signed)
Blood pressure controlled. Continue present medications. 

## 2012-08-14 NOTE — Assessment & Plan Note (Signed)
Continue aspirin and statin. Discontinue Plavix. I have reviewed his most recent nuclear study. I do not feel that it is high risk. Plan medical therapy unless he develops symptoms.

## 2012-08-14 NOTE — Assessment & Plan Note (Signed)
Patient recently noted to have Mobitz 1 prior to functional study. He is not having syncope or other symptoms of bradycardia. Will arrange 48 hour Holter monitor to further assess. He is on no AV nodal blocking agents.

## 2012-08-14 NOTE — Patient Instructions (Addendum)
Your physician recommends that you schedule a follow-up appointment in: 8 WEEKS WITH DR Jens Som  Your physician has recommended that you wear a 48 HOUR holter monitor. Holter monitors are medical devices that record the heart's electrical activity. Doctors most often use these monitors to diagnose arrhythmias. Arrhythmias are problems with the speed or rhythm of the heartbeat. The monitor is a small, portable device. You can wear one while you do your normal daily activities. This is usually used to diagnose what is causing palpitations/syncope (passing out).    STOP PLAVIX  USE NTG AS NEEDED FOR CHEST PAIN

## 2012-08-14 NOTE — Progress Notes (Signed)
HPI: pleasant male for followup of coronary artery disease. He is status post coronary artery bypassing graft in 2003. His most recent cardiac catheterization was performed in 2010. The patient had no disease in his left main. The LAD was occluded. There was no significant disease in the circumflex. The right coronary was occluded. The LIMA to the LAD was patent. Saphenous vein graft to the diagonal was occluded. The saphenous vein graft to the PDA had an 80% stenosis followed by a 40% lesion. The saphenous vein graft to the first and second marginals was patent. Ejection fraction was normal. The patient had PCI of the saphenous vein graft to PDA at that time. Note ABIs in September of 2010 were normal. Echocardiogram in September 2010 showed normal LV function. There was mild left atrial enlargement. There was mild right atrial enlargement. Patient has also had Mobitz 1 with beta-blockade in the past. Last Myoview in September of 2013 showed an ejection fraction of 51%. 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 There is partial reversibility. I reviewed this and felt the peri-infarct ischemia was mild. Patient noted to have Mobitz 1 at time of functional study. Holter monitor ordered. Echocardiogram repeated in September of 2013. Ejection fraction was 60-65%, mild left atrial enlargement, mild aortic stenosis with a mean gradient of 11 mm of mercury. A. Ascending aorta mildly dilated. Since then, he denies dyspnea, chest pain, palpitations or syncope.   Current Outpatient Prescriptions  Medication Sig Dispense Refill  . amLODipine (NORVASC) 5 MG tablet Take 5 mg by mouth daily.      Marland Kitchen aspirin 325 MG tablet Take 325 mg by mouth daily.      . Calcium Acetate, Phos Binder, (CALCIUM ACETATE PO) Take 1 tablet by mouth daily.      Marland Kitchen CLONAZEPAM PO Take by mouth daily.      . clopidogrel (PLAVIX) 75 MG tablet Take 75 mg by mouth daily.      .  cyclobenzaprine (FLEXERIL) 5 MG tablet Take 5 mg by mouth daily.      Marland Kitchen esomeprazole (NEXIUM) 40 MG capsule Take 40 mg by mouth daily before breakfast.      . ezetimibe (ZETIA) 10 MG tablet Take 10 mg by mouth daily.      . fenofibrate (TRICOR) 145 MG tablet Take 145 mg by mouth daily.      Marland Kitchen glyBURIDE (DIABETA) 5 MG tablet Take 10 mg by mouth daily with breakfast.      . Liraglutide (VICTOZA [place]) Inject into the skin daily.      Marland Kitchen lisinopril (PRINIVIL,ZESTRIL) 10 MG tablet Take 10 mg by mouth daily.      Marland Kitchen MAGNESIUM CHLORIDE ER PO Take 1 tablet by mouth daily.      . metFORMIN (GLUCOPHAGE) 1000 MG tablet Take 1,000 mg by mouth 2 (two) times daily with a meal.      . Multiple Vitamins-Minerals (MULTIVITAMIN PO) Take by mouth.      . nabumetone (RELAFEN) 500 MG tablet Take 500 mg by mouth 2 (two) times daily.      . Omega-3 Fatty Acids (FISH OIL PO) Take 1 tablet by mouth daily.      Marland Kitchen oxyCODONE-acetaminophen (PERCOCET) 10-325 MG per tablet Take 1 tablet by mouth daily.      . Potassium 99 MG TABS Take 1 tablet by mouth daily.      . Pregabalin (LYRICA PO) Take by mouth 2 (two) times daily.      Marland Kitchen  sildenafil (VIAGRA) 100 MG tablet Take 100 mg by mouth daily as needed.      . simvastatin (ZOCOR) 40 MG tablet Take 40 mg by mouth every evening.      Marland Kitchen ULORIC 40 MG tablet Take 1 tablet by mouth Daily.      Marland Kitchen VIIBRYD 40 MG TABS Take 1 tablet by mouth Daily.         Past Medical History  Diagnosis Date  . DM   . HYPERLIPIDEMIA   . ASTHMA   . GERD   . PSORIASIS   . ARTHRITIS   . SPINA BIFIDA   . SLEEP APNEA   . Hypertension   . CAD (coronary artery disease)     Past Surgical History  Procedure Date  . Coronary artery bypass graft     History   Social History  . Marital Status: Married    Spouse Name: N/A    Number of Children: N/A  . Years of Education: N/A   Occupational History  . Not on file.   Social History Main Topics  . Smoking status: Never Smoker   . Smokeless  tobacco: Not on file  . Alcohol Use: Not on file  . Drug Use: Not on file  . Sexually Active: Not on file   Other Topics Concern  . Not on file   Social History Narrative  . No narrative on file    ROS: no fevers or chills, productive cough, hemoptysis, dysphasia, odynophagia, melena, hematochezia, dysuria, hematuria, rash, seizure activity, orthopnea, PND, pedal edema, claudication. Remaining systems are negative.  Physical Exam: Well-developed well-nourished in no acute distress.  Skin is warm and dry.  HEENT is normal.  Neck is supple.  Chest is clear to auscultation with normal expansion.  Cardiovascular exam is regular rate and rhythm. 2/6 systolic murmur left sternal border. Abdominal exam nontender or distended. No masses palpated. Extremities show no edema. neuro grossly intact

## 2012-08-23 ENCOUNTER — Encounter (INDEPENDENT_AMBULATORY_CARE_PROVIDER_SITE_OTHER): Payer: PRIVATE HEALTH INSURANCE

## 2012-08-23 DIAGNOSIS — I441 Atrioventricular block, second degree: Secondary | ICD-10-CM

## 2012-09-02 ENCOUNTER — Telehealth: Payer: Self-pay | Admitting: *Deleted

## 2012-09-02 NOTE — Telephone Encounter (Signed)
Spoke with pt, aware monitor shows sinus with PVC's, Mobitz I.

## 2012-10-07 ENCOUNTER — Encounter: Payer: Self-pay | Admitting: Cardiology

## 2012-10-07 ENCOUNTER — Ambulatory Visit (INDEPENDENT_AMBULATORY_CARE_PROVIDER_SITE_OTHER): Payer: 59 | Admitting: Cardiology

## 2012-10-07 VITALS — BP 144/85 | HR 74 | Ht 75.0 in | Wt 237.0 lb

## 2012-10-07 DIAGNOSIS — R001 Bradycardia, unspecified: Secondary | ICD-10-CM

## 2012-10-07 DIAGNOSIS — I359 Nonrheumatic aortic valve disorder, unspecified: Secondary | ICD-10-CM

## 2012-10-07 DIAGNOSIS — I350 Nonrheumatic aortic (valve) stenosis: Secondary | ICD-10-CM

## 2012-10-07 DIAGNOSIS — I10 Essential (primary) hypertension: Secondary | ICD-10-CM

## 2012-10-07 DIAGNOSIS — I25810 Atherosclerosis of coronary artery bypass graft(s) without angina pectoris: Secondary | ICD-10-CM

## 2012-10-07 DIAGNOSIS — E785 Hyperlipidemia, unspecified: Secondary | ICD-10-CM

## 2012-10-07 DIAGNOSIS — I498 Other specified cardiac arrhythmias: Secondary | ICD-10-CM

## 2012-10-07 NOTE — Assessment & Plan Note (Signed)
Also monitor showed Mobitz 1. However he is not having symptoms. Continue to avoid AV nodal blocking agents. He was instructed on symptoms of presyncope and syncope.

## 2012-10-07 NOTE — Progress Notes (Signed)
HPI: pleasant male for followup of coronary artery disease. He is status post coronary artery bypassing graft in 2003. His most recent cardiac catheterization was performed in 2010. The patient had no disease in his left main. The LAD was occluded. There was no significant disease in the circumflex. The right coronary was occluded. The LIMA to the LAD was patent. Saphenous vein graft to the diagonal was occluded. The saphenous vein graft to the PDA had an 80% stenosis followed by a 40% lesion. The saphenous vein graft to the first and second marginals was patent. Ejection fraction was normal. The patient had PCI of the saphenous vein graft to PDA at that time. Note ABIs in September of 2010 were normal. Patient has also had Mobitz 1 with beta-blockade in the past. Last Myoview in September of 2013 showed an ejection fraction of 51%. 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 There is partial reversibility. I reviewed this and felt the peri-infarct ischemia was mild. Patient noted to have Mobitz 1 at time of functional study. Holter monitor in October 2013 showed sinus rhythm with Mobitz 1 and PVCs. Echocardiogram repeated in September of 2013. Ejection fraction was 60-65%, mild left atrial enlargement, mild aortic stenosis with a mean gradient of 11 mm of mercury ascending aorta mildly dilated. I last saw him in October of 2013. Since then, the patient denies any dyspnea on exertion, orthopnea, PND, pedal edema, palpitations, syncope or chest pain.    Current Outpatient Prescriptions  Medication Sig Dispense Refill  . amLODipine (NORVASC) 5 MG tablet Take 5 mg by mouth daily.      Marland Kitchen aspirin 325 MG tablet Take 325 mg by mouth daily.      . Calcium Acetate, Phos Binder, (CALCIUM ACETATE PO) Take 1 tablet by mouth daily.      Marland Kitchen CLONAZEPAM PO Take by mouth daily.      . cyclobenzaprine (FLEXERIL) 5 MG tablet Take 5 mg by mouth daily.      Marland Kitchen  esomeprazole (NEXIUM) 40 MG capsule Take 40 mg by mouth daily before breakfast.      . ezetimibe (ZETIA) 10 MG tablet Take 10 mg by mouth daily.      . fenofibrate (TRICOR) 145 MG tablet Take 145 mg by mouth daily.      Marland Kitchen glyBURIDE (DIABETA) 5 MG tablet Take 10 mg by mouth daily with breakfast.      . Liraglutide (VICTOZA [place]) Inject into the skin daily.      Marland Kitchen lisinopril (PRINIVIL,ZESTRIL) 10 MG tablet Take 10 mg by mouth daily.      Marland Kitchen MAGNESIUM CHLORIDE ER PO Take 1 tablet by mouth daily.      . metFORMIN (GLUCOPHAGE) 1000 MG tablet Take 1,000 mg by mouth 2 (two) times daily with a meal.      . Multiple Vitamins-Minerals (MULTIVITAMIN PO) Take by mouth.      . nabumetone (RELAFEN) 500 MG tablet Take 500 mg by mouth 2 (two) times daily.      . nitroGLYCERIN (NITROSTAT) 0.4 MG SL tablet Place 1 tablet (0.4 mg total) under the tongue every 5 (five) minutes as needed for chest pain.  90 tablet  3  . Omega-3 Fatty Acids (FISH OIL PO) Take 1 tablet by mouth daily.      Marland Kitchen oxyCODONE-acetaminophen (PERCOCET) 10-325 MG per tablet Take 1 tablet by mouth daily.      . Potassium 99 MG TABS Take 1 tablet  by mouth daily.      . Pregabalin (LYRICA PO) Take by mouth 2 (two) times daily.      . sildenafil (VIAGRA) 100 MG tablet Take 100 mg by mouth daily as needed.      . simvastatin (ZOCOR) 40 MG tablet Take 40 mg by mouth every evening.      Marland Kitchen ULORIC 40 MG tablet Take 1 tablet by mouth Daily.      Marland Kitchen VIIBRYD 40 MG TABS Take 1 tablet by mouth Daily.         Past Medical History  Diagnosis Date  . DM   . HYPERLIPIDEMIA   . ASTHMA   . GERD   . PSORIASIS   . ARTHRITIS   . SPINA BIFIDA   . SLEEP APNEA   . Hypertension   . CAD (coronary artery disease)     Past Surgical History  Procedure Date  . Coronary artery bypass graft     History   Social History  . Marital Status: Married    Spouse Name: N/A    Number of Children: N/A  . Years of Education: N/A   Occupational History  . Not on  file.   Social History Main Topics  . Smoking status: Never Smoker   . Smokeless tobacco: Not on file  . Alcohol Use: Not on file  . Drug Use: Not on file  . Sexually Active: Not on file   Other Topics Concern  . Not on file   Social History Narrative  . No narrative on file    ROS: no fevers or chills, productive cough, hemoptysis, dysphasia, odynophagia, melena, hematochezia, dysuria, hematuria, rash, seizure activity, orthopnea, PND, pedal edema, claudication. Remaining systems are negative.  Physical Exam: Well-developed well-nourished in no acute distress.  Skin is warm and dry.  HEENT is normal.  Neck is supple.  Chest is clear to auscultation with normal expansion.  Cardiovascular exam is regular rate and rhythm. 2/6 systolic murmur left sternal border Abdominal exam nontender or distended. No masses palpated. Extremities show no edema. neuro grossly intact

## 2012-10-07 NOTE — Patient Instructions (Addendum)
Your physician wants you to follow-up in: 6 MONTHS WITH DR CRENSHAW You will receive a reminder letter in the mail two months in advance. If you don't receive a letter, please call our office to schedule the follow-up appointment.  

## 2012-10-07 NOTE — Assessment & Plan Note (Signed)
Continue statin. 

## 2012-10-07 NOTE — Assessment & Plan Note (Signed)
Continue aspirin and statin. 

## 2012-10-07 NOTE — Assessment & Plan Note (Signed)
Blood pressure controlled. Continue present medications. 

## 2012-10-07 NOTE — Assessment & Plan Note (Signed)
Patient will need followup echocardiograms in the future. 

## 2012-10-17 ENCOUNTER — Ambulatory Visit: Payer: PRIVATE HEALTH INSURANCE | Admitting: Cardiology

## 2013-04-18 ENCOUNTER — Encounter: Payer: Self-pay | Admitting: Cardiology

## 2013-05-07 DIAGNOSIS — G8929 Other chronic pain: Secondary | ICD-10-CM | POA: Insufficient documentation

## 2013-06-13 ENCOUNTER — Ambulatory Visit: Payer: 59 | Admitting: Cardiology

## 2013-08-07 ENCOUNTER — Ambulatory Visit: Payer: 59 | Admitting: Cardiology

## 2013-09-10 ENCOUNTER — Encounter: Payer: Self-pay | Admitting: Cardiology

## 2013-09-10 ENCOUNTER — Ambulatory Visit (INDEPENDENT_AMBULATORY_CARE_PROVIDER_SITE_OTHER): Payer: 59 | Admitting: Cardiology

## 2013-09-10 ENCOUNTER — Encounter (INDEPENDENT_AMBULATORY_CARE_PROVIDER_SITE_OTHER): Payer: Self-pay

## 2013-09-10 VITALS — BP 144/88 | HR 64 | Ht 75.0 in | Wt 233.4 lb

## 2013-09-10 DIAGNOSIS — E785 Hyperlipidemia, unspecified: Secondary | ICD-10-CM

## 2013-09-10 DIAGNOSIS — I359 Nonrheumatic aortic valve disorder, unspecified: Secondary | ICD-10-CM

## 2013-09-10 DIAGNOSIS — I25810 Atherosclerosis of coronary artery bypass graft(s) without angina pectoris: Secondary | ICD-10-CM

## 2013-09-10 DIAGNOSIS — I498 Other specified cardiac arrhythmias: Secondary | ICD-10-CM

## 2013-09-10 DIAGNOSIS — R001 Bradycardia, unspecified: Secondary | ICD-10-CM

## 2013-09-10 DIAGNOSIS — I10 Essential (primary) hypertension: Secondary | ICD-10-CM

## 2013-09-10 DIAGNOSIS — I350 Nonrheumatic aortic (valve) stenosis: Secondary | ICD-10-CM

## 2013-09-10 NOTE — Patient Instructions (Signed)
Your physician wants you to follow-up in: ONE YEAR WITH DR CRENSHAW You will receive a reminder letter in the mail two months in advance. If you don't receive a letter, please call our office to schedule the follow-up appointment.  

## 2013-09-10 NOTE — Assessment & Plan Note (Signed)
Plan followup echocardiogram when he returns in one year.

## 2013-09-10 NOTE — Assessment & Plan Note (Signed)
Continue statin. Lipids and liver followed by primary care. 

## 2013-09-10 NOTE — Assessment & Plan Note (Signed)
Continue present blood pressure medications. 

## 2013-09-10 NOTE — Assessment & Plan Note (Signed)
Continue aspirin and statin. 

## 2013-09-10 NOTE — Progress Notes (Signed)
HPI: pleasant male for followup of coronary artery disease. He is status post coronary artery bypassing graft in 2003. His most recent cardiac catheterization was performed in 2010. The patient had no disease in his left main. The LAD was occluded. There was no significant disease in the circumflex. The right coronary was occluded. The LIMA to the LAD was patent. Saphenous vein graft to the diagonal was occluded. The saphenous vein graft to the PDA had an 80% stenosis followed by a 40% lesion. The saphenous vein graft to the first and second marginals was patent. Ejection fraction was normal. The patient had PCI of the saphenous vein graft to PDA at that time. Note ABIs in September of 2010 were normal. Patient has also had Mobitz 1 with beta-blockade in the past. Last Myoview in September of 2013 showed an ejection fraction of 51%. 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 There is partial reversibility. I reviewed this and felt the peri-infarct ischemia was mild. Patient noted to have Mobitz 1 at time of functional study. Holter monitor in October 2013 showed sinus rhythm with Mobitz 1 and PVCs. Echocardiogram repeated in September of 2013. Ejection fraction was 60-65%, mild left atrial enlargement, mild aortic stenosis with a mean gradient of 11 mm of mercury ascending aorta mildly dilated. I last saw him in Dec of 2013. Since then, the patient denies any dyspnea on exertion, orthopnea, PND, pedal edema, palpitations, syncope or chest pain.   Current Outpatient Prescriptions  Medication Sig Dispense Refill  . amLODipine (NORVASC) 5 MG tablet Take 5 mg by mouth daily.      Marland Kitchen aspirin 325 MG tablet Take 325 mg by mouth daily.      Marland Kitchen BYDUREON 2 MG SUSR       . Calcium Acetate, Phos Binder, (CALCIUM ACETATE PO) Take 1 tablet by mouth daily.      . clobetasol cream (TEMOVATE) 0.05 %       . CLONAZEPAM PO Take by mouth daily.      .  cyclobenzaprine (FLEXERIL) 5 MG tablet Take 5 mg by mouth daily.      Marland Kitchen esomeprazole (NEXIUM) 40 MG capsule Take 40 mg by mouth daily before breakfast.      . gabapentin (NEURONTIN) 300 MG capsule       . glyBURIDE (DIABETA) 5 MG tablet Take 10 mg by mouth daily with breakfast.      . lisinopril (PRINIVIL,ZESTRIL) 10 MG tablet Take 10 mg by mouth daily.      Marland Kitchen MAGNESIUM CHLORIDE ER PO Take 1 tablet by mouth daily.      . metFORMIN (GLUCOPHAGE) 1000 MG tablet Take 1,000 mg by mouth 2 (two) times daily with a meal.      . Multiple Vitamins-Minerals (MULTIVITAMIN PO) Take by mouth.      . nabumetone (RELAFEN) 500 MG tablet Take 500 mg by mouth 2 (two) times daily.      . nitroGLYCERIN (NITROSTAT) 0.4 MG SL tablet Place 1 tablet (0.4 mg total) under the tongue every 5 (five) minutes as needed for chest pain.  90 tablet  3  . Omega-3 Fatty Acids (FISH OIL PO) Take 1 tablet by mouth daily.      Marland Kitchen oxyCODONE-acetaminophen (PERCOCET) 10-325 MG per tablet Take 1 tablet by mouth daily.      . Potassium 99 MG TABS Take 1 tablet by mouth daily.      . Pregabalin (LYRICA  PO) Take by mouth 2 (two) times daily.      . simvastatin (ZOCOR) 40 MG tablet Take 40 mg by mouth every evening.      Marland Kitchen ULORIC 40 MG tablet Take 1 tablet by mouth Daily.      Marland Kitchen VIIBRYD 40 MG TABS Take 1 tablet by mouth Daily.       No current facility-administered medications for this visit.     Past Medical History  Diagnosis Date  . DM   . HYPERLIPIDEMIA   . ASTHMA   . GERD   . PSORIASIS   . ARTHRITIS   . SPINA BIFIDA   . SLEEP APNEA   . Hypertension   . CAD (coronary artery disease)     Past Surgical History  Procedure Laterality Date  . Coronary artery bypass graft      History   Social History  . Marital Status: Married    Spouse Name: N/A    Number of Children: N/A  . Years of Education: N/A   Occupational History  . Not on file.   Social History Main Topics  . Smoking status: Never Smoker   . Smokeless  tobacco: Not on file  . Alcohol Use: Yes  . Drug Use: No  . Sexual Activity: Not on file   Other Topics Concern  . Not on file   Social History Narrative  . No narrative on file    ROS: no fevers or chills, productive cough, hemoptysis, dysphasia, odynophagia, melena, hematochezia, dysuria, hematuria, rash, seizure activity, orthopnea, PND, pedal edema, claudication. Remaining systems are negative.  Physical Exam: Well-developed well-nourished in no acute distress.  Skin is warm and dry.  HEENT is normal.  Neck is supple.  Chest is clear to auscultation with normal expansion.  Cardiovascular exam is regular rate and rhythm. 1/6 systolic murmur left sternal border. Abdominal exam nontender or distended. No masses palpated. Extremities show no edema. 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 No ST changes.

## 2013-09-10 NOTE — Assessment & Plan Note (Signed)
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 However he is not symptomatic. Avoid AV nodal blocking agents. He may require pacemaker in the future.

## 2014-01-14 DIAGNOSIS — F32A Depression, unspecified: Secondary | ICD-10-CM | POA: Insufficient documentation

## 2015-01-31 NOTE — Progress Notes (Signed)
HPI: FU coronary artery disease. He is status post coronary artery bypassing graft in 2003. His most recent cardiac catheterization was performed in 2010. The patient had no disease in his left main. The LAD was occluded. There was no significant disease in the circumflex. The right coronary was occluded. The LIMA to the LAD was patent. Saphenous vein graft to the diagonal was occluded. The saphenous vein graft to the PDA had an 80% stenosis followed by a 40% lesion. The saphenous vein graft to the first and second marginals was patent. Ejection fraction was normal. The patient had PCI of the saphenous vein graft to PDA at that time. Note ABIs in September of 2010 were normal. Last Myoview in September of 2013 showed an ejection fraction of 51%. 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 There is partial reversibility. I reviewed this and felt the peri-infarct ischemia was mild. Patient noted to have Mobitz 1 at time of functional study. Holter monitor in October 2013 showed sinus rhythm with Mobitz 1 and PVCs. Echocardiogram repeated in September of 2013. Ejection fraction was 60-65%, mild left atrial enlargement, mild aortic stenosis with a mean gradient of 11 mm of mercury, ascending aorta mildly dilated. Since last seen, there is no dyspnea, chest pain, palpitations or syncope.  Current Outpatient Prescriptions  Medication Sig Dispense Refill  . amLODipine (NORVASC) 5 MG tablet Take 5 mg by mouth daily.    Marland Kitchen. aspirin 325 MG tablet Take 325 mg by mouth daily.    Marland Kitchen. BYDUREON 2 MG SUSR Inject 2 mg into the skin every 14 (fourteen) days.     . clobetasol cream (TEMOVATE) 0.05 % Apply 1 application topically as needed.     . clonazePAM (KLONOPIN) 1 MG tablet Take 1 tablet by mouth at bedtime as needed.  1  . cyclobenzaprine (FLEXERIL) 10 MG tablet Take 1 tablet by mouth at bedtime.    Marland Kitchen. desonide ([place]) 0.05 % cream Apply 1 application  topically as needed.    . docusate sodium (COLACE) 100 MG capsule Take 100 mg by mouth 3 (three) times daily.    Marland Kitchen. esomeprazole (NEXIUM) 40 MG capsule Take 40 mg by mouth daily before breakfast.    . FINACEA 15 % cream Apply 1 application topically 2 (two) times daily.    Marland Kitchen. gabapentin (NEURONTIN) 300 MG capsule Take 300 mg by mouth 2 (two) times daily.     Marland Kitchen. glyBURIDE (DIABETA) 5 MG tablet Take 10 mg by mouth daily with breakfast.    . HUMIRA PEN 40 MG/0.8ML PNKT Take 40 mg by mouth every 14 (fourteen) days.    Marland Kitchen. lisinopril (PRINIVIL,ZESTRIL) 10 MG tablet Take 10 mg by mouth daily.    . metFORMIN (GLUCOPHAGE) 1000 MG tablet Take 1,000 mg by mouth 2 (two) times daily with a meal.    . mirtazapine (REMERON) 30 MG tablet Take 1 tablet by mouth daily.    . Multiple Vitamins-Minerals (MULTIVITAMIN PO) Take by mouth.    . nabumetone (RELAFEN) 750 MG tablet Take 1 tablet by mouth 2 (two) times daily.    . nitroGLYCERIN (NITROSTAT) 0.4 MG SL tablet Place 1 tablet (0.4 mg total) under the tongue every 5 (five) minutes as needed for chest pain. 90 tablet 3  . nystatin-triamcinolone (MYCOLOG II) cream Apply 1 application topically as needed.    Marland Kitchen. oxyCODONE-acetaminophen (PERCOCET) 10-325 MG per tablet Take 1 tablet by mouth daily.    .Marland Kitchen  polycarbophil (FIBERCON) 625 MG tablet Take 625 mg by mouth daily.    . Potassium 99 MG TABS Take 1 tablet by mouth daily.    . simvastatin (ZOCOR) 40 MG tablet Take 40 mg by mouth every evening.    . TRICOR 145 MG tablet Take 1 tablet by mouth daily.    Marland Kitchen ULORIC 40 MG tablet Take 1 tablet by mouth Daily.    Marland Kitchen VIIBRYD 40 MG TABS Take 1 tablet by mouth Daily.     No current facility-administered medications for this visit.     Past Medical History  Diagnosis Date  . DM   . HYPERLIPIDEMIA   . ASTHMA   . GERD   . PSORIASIS   . ARTHRITIS   . SPINA BIFIDA   . SLEEP APNEA   . Hypertension   . CAD (coronary artery disease)     Past Surgical History  Procedure  Laterality Date  . Coronary artery bypass graft      History   Social History  . Marital Status: Married    Spouse Name: N/A  . Number of Children: N/A  . Years of Education: N/A   Occupational History  . Not on file.   Social History Main Topics  . Smoking status: Never Smoker   . Smokeless tobacco: Not on file  . Alcohol Use: Yes  . Drug Use: No  . Sexual Activity: Not on file   Other Topics Concern  . Not on file   Social History Narrative    ROS: chronic back pain but no fevers or chills, productive cough, hemoptysis, dysphasia, odynophagia, melena, hematochezia, dysuria, hematuria, rash, seizure activity, orthopnea, PND, pedal edema, claudication. Remaining systems are negative.  Physical Exam: Well-developed well-nourished in no acute distress.  Skin is warm and dry.  HEENT is normal.  Neck is supple.  Chest is clear to auscultation with normal expansion.  Cardiovascular exam is regular rate and rhythm.  Abdominal exam nontender or distended. No masses palpated. Extremities show no edema. neuro grossly intact  ECG sinus rhythm at a rate of 79. First-degree AV block. Nonspecific ST changes.

## 2015-02-02 ENCOUNTER — Ambulatory Visit (INDEPENDENT_AMBULATORY_CARE_PROVIDER_SITE_OTHER): Payer: BLUE CROSS/BLUE SHIELD | Admitting: Cardiology

## 2015-02-02 ENCOUNTER — Encounter: Payer: Self-pay | Admitting: Cardiology

## 2015-02-02 VITALS — BP 122/80 | HR 79 | Ht 74.0 in | Wt 227.5 lb

## 2015-02-02 DIAGNOSIS — I350 Nonrheumatic aortic (valve) stenosis: Secondary | ICD-10-CM | POA: Diagnosis not present

## 2015-02-02 DIAGNOSIS — I10 Essential (primary) hypertension: Secondary | ICD-10-CM | POA: Diagnosis not present

## 2015-02-02 DIAGNOSIS — I25810 Atherosclerosis of coronary artery bypass graft(s) without angina pectoris: Secondary | ICD-10-CM | POA: Diagnosis not present

## 2015-02-02 DIAGNOSIS — R001 Bradycardia, unspecified: Secondary | ICD-10-CM

## 2015-02-02 NOTE — Assessment & Plan Note (Signed)
Blood pressure control. Continue present medications. 

## 2015-02-02 NOTE — Patient Instructions (Signed)
Your physician wants you to follow-up in: ONE YEAR WITH DR CRENSHAW You will receive a reminder letter in the mail two months in advance. If you don't receive a letter, please call our office to schedule the follow-up appointment.  

## 2015-02-02 NOTE — Assessment & Plan Note (Signed)
Continue aspirin and statin. 

## 2015-02-02 NOTE — Assessment & Plan Note (Signed)
First-degree AV block on electrocardiogram. History of Mobitz 1. No dizziness or syncope.

## 2015-02-02 NOTE — Assessment & Plan Note (Signed)
Will plan follow-up echoes in the future. Not significant on examination at present.

## 2015-02-02 NOTE — Assessment & Plan Note (Signed)
Continue statin. Lipids and liver monitored by primary care. 

## 2017-06-05 DIAGNOSIS — I739 Peripheral vascular disease, unspecified: Secondary | ICD-10-CM | POA: Insufficient documentation

## 2018-01-16 ENCOUNTER — Encounter: Payer: Medicare Other | Admitting: Vascular Surgery

## 2018-01-31 DIAGNOSIS — D649 Anemia, unspecified: Secondary | ICD-10-CM | POA: Insufficient documentation

## 2018-02-20 ENCOUNTER — Ambulatory Visit (INDEPENDENT_AMBULATORY_CARE_PROVIDER_SITE_OTHER): Payer: BLUE CROSS/BLUE SHIELD | Admitting: Vascular Surgery

## 2018-02-20 ENCOUNTER — Other Ambulatory Visit: Payer: Self-pay

## 2018-02-20 ENCOUNTER — Encounter: Payer: Self-pay | Admitting: Vascular Surgery

## 2018-02-20 DIAGNOSIS — I70219 Atherosclerosis of native arteries of extremities with intermittent claudication, unspecified extremity: Secondary | ICD-10-CM | POA: Insufficient documentation

## 2018-02-20 DIAGNOSIS — I70211 Atherosclerosis of native arteries of extremities with intermittent claudication, right leg: Secondary | ICD-10-CM | POA: Diagnosis not present

## 2018-02-20 NOTE — Progress Notes (Signed)
Requested by:  Lindwood Qua, MD [address], [place] [address]  Reason for consultation: known bilateral PAD   History of Present Illness   Dylan Bruce is a 72 y.o. 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  Patient has known back pain with well documented peripheral arterial disease for years.  The patient has mild intermittent claudication with ambulation.  Pain is described as mild cramping, severity 1-3/10, and associated with ambulation.  Patient has attempted to treat this pain with rest. Pt's sx are limited by his back pain.  Also his recent activity level has been limited by post-op recovery from shoulder surgery. The patient has no rest pain symptoms also and prior R leg ulcer that healed spontaneously.  The patient has B leg swelling that was improved with recent increase in diruetics.  Atherosclerotic risk factors include: DM, HLD.  Past Medical History:  Diagnosis Date  . ARTHRITIS   . ASTHMA   . CAD (coronary artery disease)   . DM   . GERD   . HYPERLIPIDEMIA   . Hypertension   . PSORIASIS   . SLEEP APNEA   . SPINA BIFIDA     Past Surgical History:  Procedure Laterality Date  . CORONARY ARTERY BYPASS GRAFT      Social History   Socioeconomic History  . Marital status: Married    Spouse name: Not on file  . Number of children: Not on file  . Years of education: Not on file  . Highest education level: Not on file  Occupational History  . Not on file  Social Needs  . Financial resource strain: Not on file  . Food insecurity:    Worry: Not on file    Inability: Not on file  . Transportation needs:    Medical: Not on file    Non-medical: Not on file  Tobacco Use  . Smoking status: Never Smoker  . Smokeless tobacco: Never Used  Substance and Sexual Activity  . Alcohol use: Yes  . Drug use: No  . Sexual activity: Not on file  Lifestyle  . Physical activity:    Days per week: Not on file     Minutes per session: Not on file  . Stress: Not on file  Relationships  . Social connections:    Talks on phone: Not on file    Gets together: Not on file    Attends religious service: Not on file    Active member of club or organization: Not on file    Attends meetings of clubs or organizations: Not on file    Relationship status: Not on file  . Intimate partner violence:    Fear of current or ex partner: Not on file    Emotionally abused: Not on file    Physically abused: Not on file    Forced sexual activity: Not on file  Other Topics Concern  . Not on file  Social History Narrative  . Not on file    Family History: patient is unable to detail the medical history of his parents   Current Outpatient Medications  Medication Sig Dispense Refill  . amLODipine (NORVASC) 5 MG tablet Take 5 mg by mouth daily.    Marland Kitchen aspirin 325 MG tablet Take 325 mg by mouth daily.    Marland Kitchen BYDUREON 2 MG SUSR Inject 2 mg into the skin every 14 (fourteen) days.     . clobetasol cream (TEMOVATE)  0.05 % Apply 1 application topically as needed.     . cyclobenzaprine (FLEXERIL) 10 MG tablet Take 1 tablet by mouth at bedtime.    Marland Kitchen desonide (DESOWEN) 0.05 % cream Apply 1 application topically as needed.    . docusate sodium (COLACE) 100 MG capsule Take 100 mg by mouth 3 (three) times daily.    Marland Kitchen esomeprazole (NEXIUM) 40 MG capsule Take 40 mg by mouth daily before breakfast.    . FINACEA 15 % cream Apply 1 application topically 2 (two) times daily.    . FUROSEMIDE PO Take by mouth.    . gabapentin (NEURONTIN) 300 MG capsule Take 300 mg by mouth 2 (two) times daily.     Marland Kitchen HUMIRA PEN 40 MG/0.8ML PNKT Take 40 mg by mouth every 14 (fourteen) days.    Marland Kitchen lisinopril (PRINIVIL,ZESTRIL) 10 MG tablet Take 10 mg by mouth daily.    . metFORMIN (GLUCOPHAGE) 1000 MG tablet Take 1,000 mg by mouth 2 (two) times daily with a meal.    . mirtazapine (REMERON) 30 MG tablet Take 1 tablet by mouth daily.    . Multiple  Vitamins-Minerals (MULTIVITAMIN PO) Take by mouth.    . nabumetone (RELAFEN) 750 MG tablet Take 1 tablet by mouth 2 (two) times daily.    . nitroGLYCERIN (NITROSTAT) 0.4 MG SL tablet Place 1 tablet (0.4 mg total) under the tongue every 5 (five) minutes as needed for chest pain. 90 tablet 3  . nystatin-triamcinolone (MYCOLOG II) cream Apply 1 application topically as needed.    Marland Kitchen oxyCODONE-acetaminophen (PERCOCET) 10-325 MG per tablet Take 1 tablet by mouth daily.    . polycarbophil (FIBERCON) 625 MG tablet Take 625 mg by mouth daily.    . Potassium 99 MG TABS Take 1 tablet by mouth daily.    . simvastatin (ZOCOR) 40 MG tablet Take 40 mg by mouth every evening.    . TRICOR 145 MG tablet Take 1 tablet by mouth daily.    Marland Kitchen ULORIC 40 MG tablet Take 1 tablet by mouth Daily.    Marland Kitchen VIIBRYD 40 MG TABS Take 1 tablet by mouth Daily.    . clonazePAM (KLONOPIN) 1 MG tablet Take 1 tablet by mouth at bedtime as needed.  1  . glyBURIDE (DIABETA) 5 MG tablet Take 10 mg by mouth daily with breakfast.     No current facility-administered medications for this visit.     Allergies  Allergen Reactions  . Allopurinol   . Cefpodoxime   . Ceftin [Cefuroxime Axetil] Nausea And Vomiting  . Cefuroxime Nausea Only  . Doxycycline   . Fluocinolone   . Motrin [Ibuprofen]   . Probenecid   . Sulfamethoxazole-Trimethoprim   . Azithromycin Rash  . Ciprofloxacin Rash  . Sulfa Antibiotics Rash    REVIEW OF SYSTEMS (negative unless checked):   Cardiac:  []  Chest pain or chest pressure? []  Shortness of breath upon activity? []  Shortness of breath when lying flat? []  Irregular heart rhythm?  Vascular:  []  Pain in calf, thigh, or hip brought on by walking? []  Pain in feet at night that wakes you up from your sleep? []  Blood clot in your veins? [x]  Leg swelling?  Pulmonary:  []  Oxygen at home? []  Productive cough? []  Wheezing?  Neurologic:  []  Sudden weakness in arms or legs? []  Sudden numbness in arms or  legs? []  Sudden onset of difficult speaking or slurred speech? []  Temporary loss of vision in one eye? []  Problems with dizziness?  Gastrointestinal:  []   Blood in stool? []  Vomited blood?  Genitourinary:  []  Burning when urinating? []  Blood in urine?  Psychiatric:  []  Major depression  Hematologic:  []  Bleeding problems? []  Problems with blood clotting?  Dermatologic:  []  Rashes or ulcers?  Constitutional:  []  Fever or chills?  Ear/Nose/Throat:  []  Change in hearing? []  Nose bleeds? []  Sore throat?  Musculoskeletal:  [x]  Back pain? [x]  Joint pain? []  Muscle pain?   For VQI Use Only   PRE-ADM LIVING Home  AMB STATUS Ambulatory  CAD Sx History of MI, but no symptoms No MI within 6 months  PRIOR CHF Mild  STRESS TEST No   Physical Examination     Vitals:   02/20/18 1000 02/20/18 1009  BP: (!) 142/75 125/74  Pulse: 62 62  Resp: 18   Temp: (!) 97.4 F (36.3 C)   TempSrc: Oral   SpO2: 100%   Weight: 242 lb (109.8 kg)   Height: 6\' 2"  (1.88 m)    Body mass index is 31.07 kg/m.  General Alert, O x 3, WD, NAD  Head [place]/AT,    Ear/Nose/ Throat Hearing grossly intact, nares without erythema or drainage, oropharynx without Erythema or Exudate, Mallampati score: 3,   Eyes PERRLA, EOMI,    Neck Supple, mid-line trachea,    Pulmonary Sym exp, good B air movt, CTA B  Cardiac RRR, Nl S1, S2, no Murmurs, No rubs, No S3,S4  Vascular Vessel Right Left  Radial Palpable Palpable  Brachial Palpable Palpable  Carotid Palpable, No Bruit Palpable, No Bruit  Aorta Not palpable N/A  Femoral Palpable Palpable  Popliteal Not palpable Not palpable  PT Faintly palpable Faintly palpable  DP Not palpable Not palpable    Gastro- intestinal soft, non-distended, non-tender to palpation, No guarding or rebound, no HSM, no masses, no CVAT B, No palpable prominent aortic pulse,    Musculo- skeletal M/S 5/5 throughout  , Extremities without ischemic changes  , Non-pitting  edema present: R 2+, Varicosities present: B, No Lipodermatosclerosis present, prior vein harvest B, healed ulcers R shin,   Neurologic Cranial nerves 2-12 intact , Pain and light touch intact in extremities , Motor exam as listed above  Psychiatric Judgement intact, Mood & affect appropriate for pt's clinical situation  Dermatologic See M/S exam for extremity exam, No rashes otherwise noted  Lymphatic  Palpable lymph nodes: None    Non-Invasive Vascular imaging   Outside BLE Arterial Duplex (12/21/17)  R:   ABI: 0.75  Change from triphasic to monophasic at distal thigh  Pop and tibial monophasic  L:   ABI: 1.0  Elevated PFA PSV  Triphasic CFA, SFA and pop  Elevated PSV in L tibial  Rest of tibial normal   Outside Studies/Documentation   15 pages of outside documents were reviewed including: outside PCP notes, non-invasive studies.    Medical Decision Making   Dylan Bruce is a 72 y.o. male who presents with: IDDM with PAD as complication, RLE intermittent claudication, likely BLE chronic venous insufficiency (C3)   At this point, patient's post-op recovery status and back pain are limiting his ambulation which is limiting his intermittent claudication, so there is no advantage to proceeding with angiography at this point.  We discussed the signs and sx of critical limb ischemia.  He will contact us if he develops any of these symptoms.  I suspect his prior R shin ulcer is a combination venous and arterial disease.  Based on this patient's history and physical exam, I  recommend: 10-15 mm Hg OTC compressive therapy to BLE leg, ABI and RLE arterial duplex in 6 months to re-evaluate the femoropopliteal segment.   I discussed with the patient the natural history of intermittent claudication: 75% of patients have stable or improved symptoms in a year an only 2% require amputation. Eventually 20% may require intervention in a year.  I discussed in depth with the  patient the nature of atherosclerosis, and emphasized the importance of maximal medical management including strict control of blood pressure, blood glucose, and lipid levels, antiplatelet agent, obtaining regular exercise, and cessation of smoking.    The patient is aware that without maximal medical management the underlying atherosclerotic disease process will progress, limiting the benefit of any interventions.  I discussed in depth with the patient a walking plan and how to execute such.  The patient is not interested in starting Pletal. The patient is currently on a statin: Zocor.  The patient is currently on an anti-platelet: ASA.  Thank you for allowing [place] to participate in this patient's care.   Leonides Sake, MD, FACS [facility] Office: [phone] Pager: [phone]  02/20/2018, 10:34 AM

## 2018-03-18 ENCOUNTER — Other Ambulatory Visit: Payer: Self-pay

## 2018-03-18 DIAGNOSIS — I70211 Atherosclerosis of native arteries of extremities with intermittent claudication, right leg: Secondary | ICD-10-CM

## 2018-06-17 ENCOUNTER — Other Ambulatory Visit: Payer: Self-pay

## 2018-06-17 ENCOUNTER — Observation Stay (HOSPITAL_COMMUNITY)
Admission: AD | Admit: 2018-06-17 | Discharge: 2018-06-18 | Disposition: A | Discharge status: Home / Self Care | Payer: BLUE CROSS/BLUE SHIELD | Source: Other Acute Inpatient Hospital | Attending: Cardiovascular Disease | Admitting: Cardiovascular Disease

## 2018-06-17 DIAGNOSIS — R079 Chest pain, unspecified: Secondary | ICD-10-CM | POA: Diagnosis present

## 2018-06-17 DIAGNOSIS — I10 Essential (primary) hypertension: Secondary | ICD-10-CM | POA: Diagnosis not present

## 2018-06-17 DIAGNOSIS — I25810 Atherosclerosis of coronary artery bypass graft(s) without angina pectoris: Secondary | ICD-10-CM | POA: Diagnosis present

## 2018-06-17 DIAGNOSIS — E1151 Type 2 diabetes mellitus with diabetic peripheral angiopathy without gangrene: Secondary | ICD-10-CM | POA: Insufficient documentation

## 2018-06-17 DIAGNOSIS — Z886 Allergy status to analgesic agent status: Secondary | ICD-10-CM | POA: Insufficient documentation

## 2018-06-17 DIAGNOSIS — I350 Nonrheumatic aortic (valve) stenosis: Secondary | ICD-10-CM | POA: Diagnosis not present

## 2018-06-17 DIAGNOSIS — I739 Peripheral vascular disease, unspecified: Secondary | ICD-10-CM | POA: Insufficient documentation

## 2018-06-17 DIAGNOSIS — Z7982 Long term (current) use of aspirin: Secondary | ICD-10-CM | POA: Insufficient documentation

## 2018-06-17 DIAGNOSIS — E785 Hyperlipidemia, unspecified: Secondary | ICD-10-CM | POA: Diagnosis not present

## 2018-06-17 DIAGNOSIS — I25710 Atherosclerosis of autologous vein coronary artery bypass graft(s) with unstable angina pectoris: Secondary | ICD-10-CM | POA: Insufficient documentation

## 2018-06-17 DIAGNOSIS — Z79899 Other long term (current) drug therapy: Secondary | ICD-10-CM | POA: Diagnosis not present

## 2018-06-17 DIAGNOSIS — Z881 Allergy status to other antibiotic agents status: Secondary | ICD-10-CM | POA: Insufficient documentation

## 2018-06-17 DIAGNOSIS — Z882 Allergy status to sulfonamides status: Secondary | ICD-10-CM | POA: Diagnosis not present

## 2018-06-17 DIAGNOSIS — G4733 Obstructive sleep apnea (adult) (pediatric): Secondary | ICD-10-CM | POA: Diagnosis not present

## 2018-06-17 DIAGNOSIS — Z951 Presence of aortocoronary bypass graft: Secondary | ICD-10-CM | POA: Diagnosis not present

## 2018-06-17 DIAGNOSIS — Z7984 Long term (current) use of oral hypoglycemic drugs: Secondary | ICD-10-CM | POA: Diagnosis not present

## 2018-06-17 DIAGNOSIS — I25110 Atherosclerotic heart disease of native coronary artery with unstable angina pectoris: Principal | ICD-10-CM | POA: Insufficient documentation

## 2018-06-17 DIAGNOSIS — I25700 Atherosclerosis of coronary artery bypass graft(s), unspecified, with unstable angina pectoris: Secondary | ICD-10-CM | POA: Diagnosis not present

## 2018-06-17 LAB — CBC
HCT: 40.2 % (ref 39.0–52.0)
HEMOGLOBIN: 12.9 g/dL — AB (ref 13.0–17.0)
MCH: 26.6 pg (ref 26.0–34.0)
MCHC: 32.1 g/dL (ref 30.0–36.0)
MCV: 82.9 fL (ref 78.0–100.0)
Platelets: 398 10*3/uL (ref 150–400)
RBC: 4.85 MIL/uL (ref 4.22–5.81)
RDW: 16.1 % — ABNORMAL HIGH (ref 11.5–15.5)
WBC: 9 10*3/uL (ref 4.0–10.5)

## 2018-06-17 LAB — CREATININE, SERUM: CREATININE: 1.09 mg/dL (ref 0.61–1.24)

## 2018-06-17 LAB — TROPONIN I

## 2018-06-17 MED ORDER — CYCLOBENZAPRINE HCL 10 MG PO TABS
10.0000 mg | ORAL_TABLET | Freq: Three times a day (TID) | ORAL | Status: DC | PRN
  Administered 2018-06-17 – 2018-06-18 (×2): 10 mg via ORAL
  Filled 2018-06-17 (×2): qty 1

## 2018-06-17 MED ORDER — ASPIRIN EC 81 MG PO TBEC
81.0000 mg | DELAYED_RELEASE_TABLET | Freq: Every day | ORAL | Status: DC
  Filled 2018-06-17: qty 1

## 2018-06-17 MED ORDER — SODIUM CHLORIDE 0.9 % IV SOLN: 250.0000 mL | INTRAVENOUS | Status: DC | PRN

## 2018-06-17 MED ORDER — PANTOPRAZOLE SODIUM 40 MG PO TBEC
40.0000 mg | DELAYED_RELEASE_TABLET | Freq: Every day | ORAL | Status: DC
  Administered 2018-06-18: 40 mg via ORAL
  Filled 2018-06-17: qty 1

## 2018-06-17 MED ORDER — SODIUM CHLORIDE 0.9 % WEIGHT BASED INFUSION
3.0000 mL/kg/h | INTRAVENOUS | Status: DC
  Administered 2018-06-18: 3 mL/kg/h via INTRAVENOUS

## 2018-06-17 MED ORDER — OXYCODONE-ACETAMINOPHEN 5-325 MG PO TABS
2.0000 | ORAL_TABLET | Freq: Three times a day (TID) | ORAL | Status: DC | PRN
  Administered 2018-06-17 – 2018-06-18 (×2): 2 via ORAL
  Filled 2018-06-17 (×2): qty 2

## 2018-06-17 MED ORDER — NITROGLYCERIN 0.4 MG SL SUBL
.40 | SUBLINGUAL_TABLET | SUBLINGUAL | Status: DC
Start: ? — End: 2018-06-17

## 2018-06-17 MED ORDER — CALCIUM POLYCARBOPHIL 625 MG PO TABS
625.0000 mg | ORAL_TABLET | Freq: Every day | ORAL | Status: DC
  Administered 2018-06-17 – 2018-06-18 (×2): 625 mg via ORAL
  Filled 2018-06-17 (×2): qty 1

## 2018-06-17 MED ORDER — ALUM & MAG HYDROXIDE-SIMETH 200-200-20 MG/5ML PO SUSP
30.0000 mL | ORAL | Status: DC | PRN
  Administered 2018-06-17: 30 mL via ORAL
  Filled 2018-06-17: qty 30

## 2018-06-17 MED ORDER — FENOFIBRATE 160 MG PO TABS
160.0000 mg | ORAL_TABLET | Freq: Every day | ORAL | Status: DC
  Administered 2018-06-18: 160 mg via ORAL
  Filled 2018-06-17: qty 1

## 2018-06-17 MED ORDER — GABAPENTIN 300 MG PO CAPS
300.0000 mg | ORAL_CAPSULE | Freq: Two times a day (BID) | ORAL | Status: DC
  Administered 2018-06-17: 300 mg via ORAL
  Filled 2018-06-17 (×2): qty 1

## 2018-06-17 MED ORDER — SODIUM CHLORIDE 0.9 % WEIGHT BASED INFUSION
1.0000 mL/kg/h | INTRAVENOUS | Status: DC
  Administered 2018-06-18: 1 mL/kg/h via INTRAVENOUS

## 2018-06-17 MED ORDER — ONDANSETRON HCL 4 MG/2ML IJ SOLN: 4.0000 mg | Freq: Four times a day (QID) | INTRAMUSCULAR | Status: DC | PRN

## 2018-06-17 MED ORDER — ASPIRIN 81 MG PO CHEW
81.0000 mg | CHEWABLE_TABLET | ORAL | Status: AC
  Administered 2018-06-18: 81 mg via ORAL
  Filled 2018-06-17: qty 1

## 2018-06-17 MED ORDER — ACETAMINOPHEN 325 MG PO TABS: 650.0000 mg | ORAL_TABLET | ORAL | Status: DC | PRN

## 2018-06-17 MED ORDER — NITROGLYCERIN 2 % TD OINT
2.00 | TOPICAL_OINTMENT | TRANSDERMAL | Status: DC
Start: 2018-06-17 — End: 2018-06-17

## 2018-06-17 MED ORDER — HEPARIN (PORCINE) IN NACL 100-0.45 UNIT/ML-% IJ SOLN
1600.0000 [IU]/h | INTRAMUSCULAR | Status: DC
  Administered 2018-06-17 – 2018-06-18 (×2): 1300 [IU]/h via INTRAVENOUS
  Filled 2018-06-17 (×2): qty 250

## 2018-06-17 MED ORDER — CLONAZEPAM 0.5 MG PO TABS
1.0000 mg | ORAL_TABLET | Freq: Every day | ORAL | Status: DC
  Administered 2018-06-17: 1 mg via ORAL
  Filled 2018-06-17: qty 2

## 2018-06-17 MED ORDER — SIMVASTATIN 40 MG PO TABS
40.0000 mg | ORAL_TABLET | Freq: Every evening | ORAL | Status: DC
  Administered 2018-06-17: 40 mg via ORAL
  Filled 2018-06-17: qty 1

## 2018-06-17 MED ORDER — SODIUM CHLORIDE 0.9% FLUSH: 3.0000 mL | INTRAVENOUS | Status: DC | PRN

## 2018-06-17 MED ORDER — HEPARIN (PORCINE) IN NACL 100-0.45 UNIT/ML-% IJ SOLN
12.00 | INTRAMUSCULAR | Status: DC
Start: ? — End: 2018-06-17

## 2018-06-17 MED ORDER — MIRTAZAPINE 7.5 MG PO TABS
30.0000 mg | ORAL_TABLET | Freq: Every day | ORAL | Status: DC
  Administered 2018-06-17: 30 mg via ORAL
  Filled 2018-06-17: qty 4

## 2018-06-17 MED ORDER — SODIUM CHLORIDE 0.9% FLUSH
3.0000 mL | Freq: Two times a day (BID) | INTRAVENOUS | Status: DC
  Administered 2018-06-17: 3 mL via INTRAVENOUS

## 2018-06-17 MED ORDER — NITROGLYCERIN 0.4 MG SL SUBL: 0.4000 mg | SUBLINGUAL_TABLET | SUBLINGUAL | Status: DC | PRN

## 2018-06-17 MED ORDER — OXYMETAZOLINE HCL 0.05 % NA SOLN
1.0000 | Freq: Two times a day (BID) | NASAL | Status: DC
  Administered 2018-06-17: 1 via NASAL
  Filled 2018-06-17: qty 15

## 2018-06-17 MED ORDER — AMLODIPINE BESYLATE 5 MG PO TABS
5.0000 mg | ORAL_TABLET | Freq: Every day | ORAL | Status: DC
  Administered 2018-06-18: 5 mg via ORAL
  Filled 2018-06-17: qty 1

## 2018-06-17 MED ORDER — HEPARIN SODIUM (PORCINE) 1000 UNIT/ML IJ SOLN
2000.00 | INTRAMUSCULAR | Status: DC
Start: ? — End: 2018-06-17

## 2018-06-17 NOTE — Progress Notes (Signed)
ANTICOAGULATION CONSULT NOTE - Initial Consult  Pharmacy Consult for heparin Indication: chest pain/ACS  Allergies  Allergen Reactions  . Allopurinol   . Cefpodoxime   . Ceftin [Cefuroxime Axetil] Nausea And Vomiting  . Cefuroxime Nausea Only  . Doxycycline   . Fluocinolone   . Motrin [Ibuprofen]   . Probenecid   . Sulfamethoxazole-Trimethoprim   . Azithromycin Rash  . Ciprofloxacin Rash  . Sulfa Antibiotics Rash    Patient Measurements: Height: 6\' 2"  (188 cm) Weight: 229 lb 14.4 oz (104.3 kg) IBW/kg (Calculated) : 82.2 Heparin Dosing Weight:   Vital Signs:    Labs: No results for input(s): HGB, HCT, PLT, APTT, LABPROT, INR, HEPARINUNFRC, HEPRLOWMOCWT, CREATININE, CKTOTAL, CKMB, TROPONINI in the last 72 hours.  CrCl cannot be calculated (Patient's most recent lab result is older than the maximum 21 days allowed.).   Medical History: Past Medical History:  Diagnosis Date  . ARTHRITIS   . ASTHMA   . CAD (coronary artery disease)   . DM   . GERD   . HYPERLIPIDEMIA   . Hypertension   . PSORIASIS   . SLEEP APNEA   . SPINA BIFIDA     Assessment: 72 y.o.malewith a history of hypertension, CAD s/pCABG in 2003, obstructive sleep apnea on CPAP and [facility]. Patient continued on heparin at transfer. Will check level tonight.   Goal of Therapy:  Heparin level 0.3-0.7 units/ml Monitor platelets by anticoagulation protocol: Yes   Plan:  Start heparin infusion at 1300 units/hr Check anti-Xa level in 6 hours and daily while on heparin Continue to monitor H&H and platelets  Sheppard CoilFrank Bernedette Auston PharmD., BCPS Clinical Pharmacist 06/17/2018 9:15 PM

## 2018-06-17 NOTE — H&P (Addendum)
Cardiology Admission History and Physical:   Patient ID: Dylan Bruce; MRN: [id number]; DOB: August 26, 1946   Admission date: 06/17/2018  Primary Care Provider: Lindwood Qua, MD Primary Cardiologist: Dr. Jennette Dubin (previously Dr. Jens Som) Primary Electrophysiologist:  N/A  Chief Complaint:  Chest pain  Patient Profile:   Dylan Bruce is a 72 y.o. male with a history of hypertension, CAD s/p CABG in 2003, obstructive sleep apnea on CPAP and history of PAD transferred from [facility] ED to [facility] for possible unstable angina  History of Present Illness:   Dylan Bruce is a pleasant 72 year old Caucasian male with past medical history of hypertension, CAD s/p CABG in 2003, obstructive sleep apnea on CPAP and history of PAD.  His last cardiac catheterization was in 2010 at which time he underwent PCI to DES to PDA.  As part of the preop clearance, he underwent repeat echocardiogram and a stress test in 2018.  Echocardiogram obtained on 04/23/2017 showed EF 60 to 65%, mild to moderate LVH, grade 2 DD, mild aortic stenosis, dilated ascending aorta.  Myoview obtained in July 2018 showed large in size, severe, fixed defect involving basal inferolateral, basal anterolateral, mid inferolateral, mid anterolateral, and apical lateral segment consistent with his previous scar, no significant ischemia was noted.    He was in his usual state of health until he woke up around 3 AM in the morning of 06/17/2018.  He describes the symptom as sharp pain between the shoulder blades.  He says this is similar to what he has experienced in the past.  He eventually sought medical attention at [facility] emergency department.  EKG showed normal sinus rhythm, first-degree AV block, T wave inversion noted in lead I and aVL with very mild nonspecific changes in V5 and V6.  Similar to previous tracing in June 2018.  Troponin was negative.  Creatinine was 1.10.  Potassium in the liver function normal.  His symptom was concerning for  unstable angina and that he was subsequently transferred to [facility] for further evaluation.   Past Medical History:  Diagnosis Date  . ARTHRITIS   . ASTHMA   . CAD (coronary artery disease)   . DM   . GERD   . HYPERLIPIDEMIA   . Hypertension   . PSORIASIS   . SLEEP APNEA   . SPINA BIFIDA     Past Surgical History:  Procedure Laterality Date  . CORONARY ARTERY BYPASS GRAFT       Medications Prior to Admission: Prior to Admission medications   Medication Sig Start Date End Date Taking? Authorizing Provider  amLODipine (NORVASC) 5 MG tablet Take 5 mg by mouth daily.    [provider]  aspirin 325 MG tablet Take 325 mg by mouth daily.    [provider]  BYDUREON 2 MG SUSR Inject 2 mg into the skin every 14 (fourteen) days.  08/15/13   [provider]  clobetasol cream (TEMOVATE) 0.05 % Apply 1 application topically as needed.  08/29/13   [provider]  clonazePAM (KLONOPIN) 1 MG tablet Take 1 tablet by mouth at bedtime as needed. 11/18/14   [provider]  cyclobenzaprine (FLEXERIL) 10 MG tablet Take 1 tablet by mouth at bedtime. 01/11/15   [provider]  desonide (DESOWEN) 0.05 % cream Apply 1 application topically as needed.    [provider]  docusate sodium (COLACE) 100 MG capsule Take 100 mg by mouth 3 (three) times daily.    [provider]  esomeprazole (  NEXIUM) 40 MG capsule Take 40 mg by mouth daily before breakfast.    [provider]  FINACEA 15 % cream Apply 1 application topically 2 (two) times daily. 11/17/14   [provider]  FUROSEMIDE PO Take by mouth.    [provider]  gabapentin (NEURONTIN) 300 MG capsule Take 300 mg by mouth 2 (two) times daily.  08/15/13   [provider]  glyBURIDE (DIABETA) 5 MG tablet Take 10 mg by mouth daily with breakfast.    [provider]  HUMIRA PEN 40 MG/0.8ML PNKT Take 40 mg by mouth every 14  (fourteen) days. 12/04/14   [provider]  lisinopril (PRINIVIL,ZESTRIL) 10 MG tablet Take 10 mg by mouth daily.    [provider]  metFORMIN (GLUCOPHAGE) 1000 MG tablet Take 1,000 mg by mouth 2 (two) times daily with a meal.    [provider]  mirtazapine (REMERON) 30 MG tablet Take 1 tablet by mouth daily. 12/16/14   [provider]  Multiple Vitamins-Minerals (MULTIVITAMIN PO) Take by mouth.    [provider]  nabumetone (RELAFEN) 750 MG tablet Take 1 tablet by mouth 2 (two) times daily. 01/18/15   [provider]  nitroGLYCERIN (NITROSTAT) 0.4 MG SL tablet Place 1 tablet (0.4 mg total) under the tongue every 5 (five) minutes as needed for chest pain. 08/14/12   Lewayne Buntingrenshaw, Brian S, MD  nystatin-triamcinolone (MYCOLOG II) cream Apply 1 application topically as needed.    [provider]  oxyCODONE-acetaminophen (PERCOCET) 10-325 MG per tablet Take 1 tablet by mouth daily.    [provider]  polycarbophil (FIBERCON) 625 MG tablet Take 625 mg by mouth daily.    [provider]  Potassium 99 MG TABS Take 1 tablet by mouth daily.    [provider]  simvastatin (ZOCOR) 40 MG tablet Take 40 mg by mouth every evening.    [provider]  TRICOR 145 MG tablet Take 1 tablet by mouth daily. 12/24/14   [provider]  ULORIC 40 MG tablet Take 1 tablet by mouth Daily. 06/04/12   [provider]  VIIBRYD 40 MG TABS Take 1 tablet by mouth Daily. 06/12/12   [provider]     Allergies:    Allergies  Allergen Reactions  . Allopurinol   . Cefpodoxime   . Ceftin [Cefuroxime Axetil] Nausea And Vomiting  . Cefuroxime Nausea Only  . Doxycycline   . Fluocinolone   . Motrin [Ibuprofen]   . Probenecid   . Sulfamethoxazole-Trimethoprim   . Azithromycin Rash  . Ciprofloxacin Rash  . Sulfa Antibiotics Rash    Social History:   Social History   Socioeconomic History  . Marital  status: Married    Spouse name: Not on file  . Number of children: Not on file  . Years of education: Not on file  . Highest education level: Not on file  Occupational History  . Not on file  Social Needs  . Financial resource strain: Not on file  . Food insecurity:    Worry: Not on file    Inability: Not on file  . Transportation needs:    Medical: Not on file    Non-medical: Not on file  Tobacco Use  . Smoking status: Never Smoker  . Smokeless tobacco: Never Used  Substance and Sexual Activity  . Alcohol use: Yes  . Drug use: No  . Sexual activity: Not on file  Lifestyle  . Physical activity:  Days per week: Not on file    Minutes per session: Not on file  . Stress: Not on file  Relationships  . Social connections:    Talks on phone: Not on file    Gets together: Not on file    Attends religious service: Not on file    Active member of club or organization: Not on file    Attends meetings of clubs or organizations: Not on file    Relationship status: Not on file  . Intimate partner violence:    Fear of current or ex partner: Not on file    Emotionally abused: Not on file    Physically abused: Not on file    Forced sexual activity: Not on file  Other Topics Concern  . Not on file  Social History Narrative  . Not on file    Family History:   The patient's family history is not on file.    ROS:  Please see the history of present illness.  All other ROS reviewed and negative.     Physical Exam/Data:   Vitals:   06/17/18 1700  BP: (!) 129/104  Pulse: 71  Resp: 18  Temp: 99.2 F (37.3 C)  TempSrc: Oral  SpO2: 98%  Weight: 104.3 kg  Height: 6\' 2"  (1.88 m)    Intake/Output Summary (Last 24 hours) at 06/17/2018 1941 Last data filed at 06/17/2018 1800 Gross per 24 hour  Intake 0 ml  Output -  Net 0 ml   Filed Weights   06/17/18 1700  Weight: 104.3 kg   Body mass index is 29.52 kg/m.  General:  Well nourished, well developed, in no acute  distress HEENT: normal Lymph: no adenopathy Neck: no JVD Endocrine:  No thryomegaly Vascular: No carotid bruits; FA pulses 2+ bilaterally without bruits  Cardiac:  normal S1, S2; RRR; 3 out of 6 systolic murmur near the apex Lungs:  clear to auscultation bilaterally, no wheezing, rhonchi or rales  Abd: soft, nontender, no hepatomegaly  Ext: no edema Musculoskeletal:  No deformities, BUE and BLE strength normal and equal Skin: warm and dry  Neuro:  CNs 2-12 intact, no focal abnormalities noted Psych:  Normal affect    EKG:  [facility] was personally reviewed and demonstrates normal sinus rhythm with T wave inversion in lead I and aVL  Relevant CV Studies:  Myoview 05/17/2017 mpressions:  - Abnormal myocardial perfusion study - No significant ischemia is noted on perfusion imaging - There is a large in size, severe, fixed defect involving the basal  inferolateral, basal anterolateral, mid inferolateral, mid anterolateral  and apical lateral segments. This is consistent with scar. - Post stress: Global systolic function is low normal. The anterolateral  and inferolateral walls are akinetic. The ejection fraction calculated at  52%.   Laboratory Data:  ChemistryNo results for input(s): NA, K, CL, CO2, GLUCOSE, BUN, CREATININE, CALCIUM, GFRNONAA, GFRAA, ANIONGAP in the last 168 hours.  No results for input(s): PROT, ALBUMIN, AST, ALT, ALKPHOS, BILITOT in the last 168 hours. HematologyNo results for input(s): WBC, RBC, HGB, HCT, MCV, MCH, MCHC, RDW, PLT in the last 168 hours. Cardiac EnzymesNo results for input(s): TROPONINI in the last 168 hours. No results for input(s): TROPIPOC in the last 168 hours.  BNPNo results for input(s): BNP, PROBNP in the last 168 hours.  DDimer No results for input(s): DDIMER in the last 168 hours.  Radiology/Studies:  No results found.  Assessment and Plan:   1.  Chest pain: Last intervention was in 2010 where he underwent PCI to  SVG to PDA.  Had prolonged episode of chest discomfort this morning.  Chest pain was similar to what he has been experiencing in the past.  Various options has been discussed with the patient, given negative Myoview in 2018 and a remote history of PCI and CABG, decision was made to proceed with cardiac catheterization.  Risk and benefit has been discussed with the patient, he displayed clear understanding and agreed to proceed.  2. CAD s/p CABG in 2003: Last PCI to vein graft to PDA in 2010.  3. Obstructive sleep apnea: On CPAP  4. PDA: No significant discomfort.  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  Severity of Illness: The appropriate patient status for this patient is OBSERVATION. Observation status is judged to be reasonable and necessary in order to provide the required intensity of service to ensure the patient's safety. The patient's presenting symptoms, physical exam findings, and initial radiographic and laboratory data in the context of their medical condition is felt to place them at decreased risk for further clinical deterioration. Furthermore, it is anticipated that the patient will be medically stable for discharge from the hospital within 2 midnights of admission. The following factors support the patient status of observation.   " The patient's presenting symptoms include Chest pain. " The physical exam findings include benign. " The initial radiographic and laboratory data are EKG.     For questions or updates, please contact [facility] Please consult [email or web address] for contact info under Cardiology/STEMI.    Ramond Dial, [place]  06/17/2018 7:41 PM   I have seen and examined the patient along with Azalee Course, PA .  I have reviewed the chart, notes and new data.  I agree with PA's note.  Key new complaints: symptoms similar to previous angina, protracted, occurred at rest and are very concerning for unstable  angina pectoris Key examination changes: scars of sternotomy and SVG harvest, 2/6 early peaking AS murmur. Otherwise normal CV exam Key new findings / data: ECG with T wave inversion I and aVL - similar to 2016. Normal cardiac troponin x3  PLAN: Clinical presentation is very concerning for an acute coronary sd, maybe threatened graft occlusion (13 years after CABG). Despite the reassuring ECG and enzymes, I think the best next step is coronary/graft angiography and if necessary and appropriate, PCI/stent. This procedure has been fully reviewed with the patient and written informed consent has been obtained.   Thurmon Fair, MD, [facility] [phone] 06/17/2018, 7:57 PM

## 2018-06-18 ENCOUNTER — Encounter (HOSPITAL_COMMUNITY)
Admission: AD | Disposition: A | Discharge status: Home / Self Care | Payer: Self-pay | Attending: Cardiovascular Disease

## 2018-06-18 ENCOUNTER — Encounter (HOSPITAL_COMMUNITY): Payer: Self-pay | Admitting: Cardiovascular Disease

## 2018-06-18 DIAGNOSIS — E785 Hyperlipidemia, unspecified: Secondary | ICD-10-CM | POA: Diagnosis not present

## 2018-06-18 DIAGNOSIS — I25710 Atherosclerosis of autologous vein coronary artery bypass graft(s) with unstable angina pectoris: Secondary | ICD-10-CM | POA: Diagnosis not present

## 2018-06-18 DIAGNOSIS — I10 Essential (primary) hypertension: Secondary | ICD-10-CM | POA: Diagnosis not present

## 2018-06-18 DIAGNOSIS — I25110 Atherosclerotic heart disease of native coronary artery with unstable angina pectoris: Secondary | ICD-10-CM | POA: Diagnosis not present

## 2018-06-18 DIAGNOSIS — E782 Mixed hyperlipidemia: Secondary | ICD-10-CM

## 2018-06-18 HISTORY — PX: LEFT HEART CATH AND CORS/GRAFTS ANGIOGRAPHY: CATH118250

## 2018-06-18 LAB — CBC
HCT: 40 % (ref 39.0–52.0)
HEMOGLOBIN: 12.9 g/dL — AB (ref 13.0–17.0)
MCH: 26.7 pg (ref 26.0–34.0)
MCHC: 32.3 g/dL (ref 30.0–36.0)
MCV: 82.6 fL (ref 78.0–100.0)
Platelets: 369 10*3/uL (ref 150–400)
RBC: 4.84 MIL/uL (ref 4.22–5.81)
RDW: 16 % — ABNORMAL HIGH (ref 11.5–15.5)
WBC: 8.1 10*3/uL (ref 4.0–10.5)

## 2018-06-18 LAB — HIV ANTIBODY (ROUTINE TESTING W REFLEX): HIV Screen 4th Generation wRfx: NONREACTIVE

## 2018-06-18 LAB — BASIC METABOLIC PANEL
ANION GAP: 12 (ref 5–15)
BUN: 21 mg/dL (ref 8–23)
CO2: 21 mmol/L — ABNORMAL LOW (ref 22–32)
Calcium: 9.9 mg/dL (ref 8.9–10.3)
Chloride: 104 mmol/L (ref 98–111)
Creatinine, Ser: 1.06 mg/dL (ref 0.61–1.24)
GFR calc non Af Amer: >60 mL/min (ref >60)
GLUCOSE: 220 mg/dL — AB (ref 70–99)
POTASSIUM: 4.1 mmol/L (ref 3.5–5.1)
Sodium: 137 mmol/L (ref 135–145)

## 2018-06-18 LAB — POCT ACTIVATED CLOTTING TIME: Activated Clotting Time: 114 seconds

## 2018-06-18 LAB — TROPONIN I

## 2018-06-18 LAB — HEPARIN LEVEL (UNFRACTIONATED): Heparin Unfractionated: 0.15 IU/mL — ABNORMAL LOW (ref 0.30–0.70)

## 2018-06-18 SURGERY — LEFT HEART CATH AND CORS/GRAFTS ANGIOGRAPHY
Anesthesia: LOCAL

## 2018-06-18 MED ORDER — MORPHINE SULFATE (PF) 2 MG/ML IV SOLN: 2.0000 mg | INTRAVENOUS | Status: DC | PRN

## 2018-06-18 MED ORDER — MIDAZOLAM HCL 2 MG/2ML IJ SOLN
INTRAMUSCULAR | Status: AC
  Filled 2018-06-18: qty 2

## 2018-06-18 MED ORDER — IOHEXOL 350 MG/ML SOLN
INTRAVENOUS | Status: DC | PRN
  Administered 2018-06-18: 105 mL via INTRA_ARTERIAL

## 2018-06-18 MED ORDER — ONDANSETRON HCL 4 MG/2ML IJ SOLN: 4.0000 mg | Freq: Four times a day (QID) | INTRAMUSCULAR | Status: DC | PRN

## 2018-06-18 MED ORDER — SODIUM CHLORIDE 0.9 % IV SOLN
INTRAVENOUS | Status: DC
  Administered 2018-06-18: 12:00:00 via INTRAVENOUS

## 2018-06-18 MED ORDER — HEPARIN (PORCINE) IN NACL 1000-0.9 UT/500ML-% IV SOLN
INTRAVENOUS | Status: AC
  Filled 2018-06-18: qty 1000

## 2018-06-18 MED ORDER — AMLODIPINE BESYLATE 10 MG PO TABS: 10.0000 mg | ORAL_TABLET | Freq: Every day | ORAL | 3 refills | Status: DC

## 2018-06-18 MED ORDER — FENTANYL CITRATE (PF) 100 MCG/2ML IJ SOLN
INTRAMUSCULAR | Status: AC
  Filled 2018-06-18: qty 2

## 2018-06-18 MED ORDER — ATORVASTATIN CALCIUM 80 MG PO TABS: 80.0000 mg | ORAL_TABLET | Freq: Every day | ORAL | 3 refills | Status: DC

## 2018-06-18 MED ORDER — HEPARIN (PORCINE) IN NACL 1000-0.9 UT/500ML-% IV SOLN
INTRAVENOUS | Status: DC | PRN
  Administered 2018-06-18 (×2): 500 mL

## 2018-06-18 MED ORDER — OXYCODONE-ACETAMINOPHEN 5-325 MG PO TABS
2.0000 | ORAL_TABLET | Freq: Once | ORAL | Status: AC
  Administered 2018-06-18: 2 via ORAL
  Filled 2018-06-18: qty 2

## 2018-06-18 MED ORDER — ISOSORBIDE MONONITRATE ER 30 MG PO TB24: 30.0000 mg | ORAL_TABLET | Freq: Every day | ORAL | 3 refills | Status: DC

## 2018-06-18 MED ORDER — SODIUM CHLORIDE 0.9 % IV SOLN: 250.0000 mL | INTRAVENOUS | Status: DC | PRN

## 2018-06-18 MED ORDER — LIDOCAINE HCL (PF) 1 % IJ SOLN
INTRAMUSCULAR | Status: AC
  Filled 2018-06-18: qty 30

## 2018-06-18 MED ORDER — LIDOCAINE HCL (PF) 1 % IJ SOLN
INTRAMUSCULAR | Status: DC | PRN
  Administered 2018-06-18: 20 mL

## 2018-06-18 MED ORDER — ASPIRIN 81 MG PO CHEW: 81.0000 mg | CHEWABLE_TABLET | Freq: Every day | ORAL | Status: DC

## 2018-06-18 MED ORDER — HEPARIN BOLUS VIA INFUSION
3000.0000 [IU] | Freq: Once | INTRAVENOUS | Status: AC
  Administered 2018-06-18: 3000 [IU] via INTRAVENOUS
  Filled 2018-06-18: qty 3000

## 2018-06-18 MED ORDER — SODIUM CHLORIDE 0.9% FLUSH: 3.0000 mL | Freq: Two times a day (BID) | INTRAVENOUS | Status: DC

## 2018-06-18 MED ORDER — ACETAMINOPHEN 325 MG PO TABS: 650.0000 mg | ORAL_TABLET | ORAL | Status: DC | PRN

## 2018-06-18 MED ORDER — AMLODIPINE BESYLATE 10 MG PO TABS: 10.0000 mg | ORAL_TABLET | Freq: Every day | ORAL | Status: DC

## 2018-06-18 MED ORDER — CLOPIDOGREL BISULFATE 75 MG PO TABS: 75.0000 mg | ORAL_TABLET | Freq: Every day | ORAL | 3 refills | Status: DC

## 2018-06-18 MED ORDER — ASPIRIN 81 MG PO TBEC: 81.0000 mg | DELAYED_RELEASE_TABLET | Freq: Every day | ORAL | 3 refills | Status: DC

## 2018-06-18 MED ORDER — ISOSORBIDE MONONITRATE ER 30 MG PO TB24
30.0000 mg | ORAL_TABLET | Freq: Every day | ORAL | Status: DC
  Administered 2018-06-18: 30 mg via ORAL
  Filled 2018-06-18: qty 1

## 2018-06-18 MED ORDER — MIDAZOLAM HCL 2 MG/2ML IJ SOLN
INTRAMUSCULAR | Status: DC | PRN
  Administered 2018-06-18: 1 mg via INTRAVENOUS

## 2018-06-18 MED ORDER — SODIUM CHLORIDE 0.9% FLUSH: 3.0000 mL | INTRAVENOUS | Status: DC | PRN

## 2018-06-18 MED ORDER — FENTANYL CITRATE (PF) 100 MCG/2ML IJ SOLN
INTRAMUSCULAR | Status: DC | PRN
  Administered 2018-06-18: 25 ug via INTRAVENOUS

## 2018-06-18 MED ORDER — CLOPIDOGREL BISULFATE 75 MG PO TABS
75.0000 mg | ORAL_TABLET | Freq: Every day | ORAL | Status: DC
  Administered 2018-06-18: 75 mg via ORAL
  Filled 2018-06-18: qty 1

## 2018-06-18 SURGICAL SUPPLY — 11 items
CATH 5FR JL3.5 JR4 ANG PIG MP (CATHETERS) ×2 IMPLANT
CATH EXPO 5F MPA-1 (CATHETERS) ×2 IMPLANT
CATH INFINITI 5 FR RCB (CATHETERS) ×2 IMPLANT
CATH INFINITI 5FR JL5 (CATHETERS) ×2 IMPLANT
KIT HEART LEFT (KITS) ×2 IMPLANT
PACK CARDIAC CATHETERIZATION (CUSTOM PROCEDURE TRAY) ×2 IMPLANT
SHEATH PINNACLE 5F 10CM (SHEATH) ×2 IMPLANT
SYR MEDRAD MARK V 150ML (SYRINGE) ×2 IMPLANT
TRANSDUCER W/STOPCOCK (MISCELLANEOUS) ×2 IMPLANT
TUBING CIL FLEX 10 FLL-RA (TUBING) ×2 IMPLANT
WIRE EMERALD 3MM-J .035X150CM (WIRE) ×2 IMPLANT

## 2018-06-18 NOTE — Discharge Summary (Signed)
Discharge Summary    Patient ID: Dylan Bruce,  MRN: [id number], DOB/AGE: [birth year] 72 y.o.  Admit date: 06/17/2018 Discharge date: 06/18/2018  Primary Care Provider: Lindwood Qua Primary Cardiologist: Dr. Lucianne Muss [facility])  Discharge Diagnoses    Principal Problem:   Chest pain Active Problems:   CAD (coronary artery disease) of artery bypass graft   Essential hypertension   Allergies Allergies  Allergen Reactions  . Allopurinol   . Cefpodoxime   . Ceftin [Cefuroxime Axetil] Nausea And Vomiting  . Cefuroxime Nausea Only  . Doxycycline   . Fluocinolone   . Motrin [Ibuprofen]   . Probenecid   . Sulfamethoxazole-Trimethoprim   . Azithromycin Rash  . Ciprofloxacin Rash  . Sulfa Antibiotics Rash    Diagnostic Studies/Procedures    Left heart catheterization 06/18/18:  Prox LAD lesion is 100% stenosed.  Ost 1st Diag lesion is 90% stenosed.  1st Mrg lesion is 100% stenosed.  Mid RCA lesion is 100% stenosed.  Origin to Prox Graft lesion is 100% stenosed.  Origin to Prox Graft lesion is 100% stenosed.  The left ventricular systolic function is normal.  LV end diastolic pressure is mildly elevated.  The left ventricular ejection fraction is 50-55% by visual estimate.  IMPRESSION: [name] has an occluded vein to the distal right and to the diagonal branch.  The LIMA to the LAD is widely patent.  He does have left to right collaterals.  The vein to it OM branch is patent as well.  He has normal LV function.  Medical therapy will be recommended.  The sheath was removed and pressure held in the groin to achieve hemostasis.  The patient left the lab in stable condition. _____________   History of Present Illness     72 year old Caucasian male with past medical history of hypertension, CAD s/p CABG in 2003, obstructive sleep apnea on CPAP and history of PAD.  His last cardiac catheterization was in 2010 at which time he underwent PCI to DES to PDA.  As part of the  preop clearance, he underwent repeat echocardiogram and a stress test in 2018.  Echocardiogram obtained on 04/23/2017 showed EF 60 to 65%, mild to moderate LVH, grade 2 DD, mild aortic stenosis, dilated ascending aorta.  Myoview obtained in July 2018 showed large in size, severe, fixed defect involving basal inferolateral, basal anterolateral, mid inferolateral, mid anterolateral, and apical lateral segment consistent with his previous scar, no significant ischemia was noted.    He was in his usual state of health until he woke up around 3 AM in the morning of 06/17/2018.  He describes the symptom as sharp pain between the shoulder blades.  He says this is similar to what he has experienced in the past.  He eventually sought medical attention at [facility] emergency department.  EKG showed normal sinus rhythm, first-degree AV block, T wave inversion noted in lead I and aVL with very mild nonspecific changes in V5 and V6.  Similar to previous tracing in June 2018.  Troponin was negative.  Creatinine was 1.10.  Potassium in the liver function normal.  His symptom was concerning for unstable angina and that he was subsequently transferred to [facility] for further evaluation.  Hospital Course     Consultants: None   1. Unstable angina in patient with CAD s/p CABG: patient presented with pain between his shoulder blades similar to prior cardiac events. EKG was without ischemic changes and troponins were negative. He underwent LHC 06/18/18 which  revealed occluded vein to the distal right and to the diagonal branch; patent LIMA to LAD with left to right collaterals and vein to OM branch. He was noted to have normal LV function and was recommended for medical management. He was started on imdur for antianginal effects. Also started on plavix, recommended for a minimum of 1 year. Groin site was without hematoma or ecchymosis at the time of discharge - Continue amlodipine and imdur  2. HTN: BP persistently  elevated this admission. Home amlodipine increased to 10mg  daily. Home losartan and lasix resumed at discharge (held for cath) - Continue amlodipine, lisinopril, and lasix  3. 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 HLD: home simvastatin was discontinued this admission given interaction with amlodipine. He was transitioned to atorvastatin 80mg  daily - Continue statin, zetia, fenofibrate, and tricor _____________  Discharge Vitals Blood pressure (!) 147/82, pulse 79, temperature 98.1 F (36.7 C), temperature source Oral, resp. rate 12, height 6\' 2"  (1.88 m), weight 101.7 kg, SpO2 98 %.  Filed Weights   06/17/18 1700 06/18/18 0602  Weight: 104.3 kg 101.7 kg    Labs & Radiologic Studies    CBC Recent Labs    06/17/18 2011 06/18/18 0245  WBC 9.0 8.1  HGB 12.9* 12.9*  HCT 40.2 40.0  MCV 82.9 82.6  PLT 398 369   Basic Metabolic Panel Recent Labs    96/04/54 2011 06/18/18 0245  NA  --  137  K  --  4.1  CL  --  104  CO2  --  21*  GLUCOSE  --  220*  BUN  --  21  CREATININE 1.09 1.06  CALCIUM  --  9.9   Liver Function Tests No results for input(s): AST, ALT, ALKPHOS, BILITOT, PROT, ALBUMIN in the last 72 hours. No results for input(s): LIPASE, AMYLASE in the last 72 hours. Cardiac Enzymes Recent Labs    06/17/18 2011 06/18/18 0245 06/18/18 0727  TROPONINI <0.03 <0.03 <0.03   BNP Invalid input(s): POCBNP D-Dimer No results for input(s): DDIMER in the last 72 hours. Hemoglobin A1C No results for input(s): HGBA1C in the last 72 hours. Fasting Lipid Panel No results for input(s): CHOL, HDL, LDLCALC, TRIG, CHOLHDL, LDLDIRECT in the last 72 hours. Thyroid Function Tests No results for input(s): TSH, T4TOTAL, T3FREE, THYROIDAB in the last 72 hours.  Invalid input(s): FREET3 _____________  No results found. Disposition   Patient was seen and examined by Dr. Royann Shivers who deemed patient as stable for discharge. Follow-up  has been arranged. Discharge medications as listed below.   Follow-up Plans & Appointments    Follow-up Information    Eldred Manges, MD Follow up.   Specialty:  Cardiology Why:  Please call to schedule an appointment to be seen within 1-2 weeks of discharge.  Contact information: [id number] [place] [address] [phone]            Discharge Medications   Allergies as of 06/18/2018      Reactions   Allopurinol    Cefpodoxime    Ceftin [cefuroxime Axetil] Nausea And Vomiting   Cefuroxime Nausea Only   Doxycycline    Fluocinolone    Motrin [ibuprofen]    Probenecid    Sulfamethoxazole-trimethoprim    Azithromycin Rash   Ciprofloxacin Rash   Sulfa Antibiotics Rash      Medication List    STOP taking these medications   aspirin 325 MG tablet Replaced by:  aspirin 81 MG EC tablet   CIPRODEX OTIC suspension Generic drug:  ciprofloxacin-dexamethasone   simvastatin 40 MG tablet Commonly known as:  ZOCOR     TAKE these medications   albuterol 108 (90 Base) MCG/ACT inhaler Commonly known as:  PROVENTIL HFA;VENTOLIN HFA Inhale 2 puffs into the lungs every 4 (four) hours as needed for wheezing.   amLODipine 10 MG tablet Commonly known as:  NORVASC Take 1 tablet (10 mg total) by mouth daily. Start taking on:  06/19/2018 What changed:    medication strength  how much to take  Another medication with the same name was removed. Continue taking this medication, and follow the directions you see here.   ANUCORT-HC 25 MG suppository Generic drug:  hydrocortisone Place 1 suppository rectally 2 (two) times daily as needed for hemorrhoids.   aspirin 81 MG EC tablet Take 1 tablet (81 mg total) by mouth daily. Start taking on:  06/19/2018 Replaces:  aspirin 325 MG tablet   atorvastatin 80 MG tablet Commonly known as:  LIPITOR Take 1 tablet (80 mg total) by mouth daily.   BYDUREON 2 MG Susr Generic drug:  Exenatide ER Inject 2 mg into the  skin every 7 (seven) days.   clobetasol cream 0.05 % Commonly known as:  TEMOVATE Apply 1 application topically as needed.   clonazePAM 1 MG tablet Commonly known as:  KLONOPIN Take 1 tablet by mouth at bedtime as needed.   clopidogrel 75 MG tablet Commonly known as:  PLAVIX Take 1 tablet (75 mg total) by mouth daily.   COSENTYX SENSOREADY (300 MG) 150 MG/ML Soaj Generic drug:  Secukinumab Inject 300 mg into the skin every 30 (thirty) days.   cyclobenzaprine 10 MG tablet Commonly known as:  FLEXERIL Take 10 mg by mouth at bedtime.   desonide 0.05 % cream Commonly known as:  DESOWEN Apply 1 application topically as needed.   docusate sodium 100 MG capsule Commonly known as:  COLACE Take 100 mg by mouth daily.   esomeprazole 40 MG capsule Commonly known as:  NEXIUM Take 40 mg by mouth daily before breakfast.   ezetimibe 10 MG tablet Commonly known as:  ZETIA Take 10 mg by mouth daily.   FINACEA 15 % cream Generic drug:  Azelaic Acid Apply 1 application topically 2 (two) times daily.   FUROSEMIDE PO Take by mouth.   gabapentin 300 MG capsule Commonly known as:  NEURONTIN Take 300 mg by mouth 2 (two) times daily.   glyBURIDE 5 MG tablet Commonly known as:  DIABETA Take 10 mg by mouth daily with breakfast.   HUMALOG KWIKPEN 100 UNIT/ML KiwkPen Generic drug:  insulin lispro Inject 12 Units into the skin 3 (three) times daily with meals.   HUMIRA PEN 40 MG/0.8ML Pnkt Generic drug:  Adalimumab Take 40 mg by mouth every 14 (fourteen) days.   isosorbide mononitrate 30 MG 24 hr tablet Commonly known as:  IMDUR Take 1 tablet (30 mg total) by mouth daily.   LANTUS SOLOSTAR 100 UNIT/ML Solostar Pen Generic drug:  Insulin Glargine Inject 22 Units into the skin at bedtime.   lisinopril 10 MG tablet Commonly known as:  PRINIVIL,ZESTRIL Take 10 mg by mouth daily.   LYRICA 100 MG capsule Generic drug:  pregabalin Take 100 mg by mouth 3 (three) times daily.     metFORMIN 1000 MG tablet Commonly known as:  GLUCOPHAGE Take 1,000 mg by mouth 2 (two) times daily with a meal.   mirtazapine 30 MG tablet Commonly known  as:  REMERON Take 1 tablet by mouth daily.   MULTIVITAMIN PO Take by mouth.   nabumetone 750 MG tablet Commonly known as:  RELAFEN Take 1 tablet by mouth 2 (two) times daily.   nitroGLYCERIN 0.4 MG SL tablet Commonly known as:  NITROSTAT Place 1 tablet (0.4 mg total) under the tongue every 5 (five) minutes as needed for chest pain.   nystatin-triamcinolone cream Commonly known as:  MYCOLOG II Apply 1 application topically as needed.   PERCOCET 10-325 MG tablet Generic drug:  oxyCODONE-acetaminophen Take 1 tablet by mouth daily.   polycarbophil 625 MG tablet Commonly known as:  FIBERCON Take 625 mg by mouth daily.   Potassium 99 MG Tabs Take 1 tablet by mouth daily.   PREVIDENT 1.1 % Gel dental gel Generic drug:  sodium fluoride Place 1 application onto teeth daily.   SOOLANTRA 1 % Crea Generic drug:  Ivermectin Apply 1 application topically at bedtime.   tamsulosin 0.4 MG Caps capsule Commonly known as:  FLOMAX Take 0.4 capsules by mouth daily.   TRICOR 145 MG tablet Generic drug:  fenofibrate Take 1 tablet by mouth daily.   ULORIC 40 MG tablet Generic drug:  febuxostat Take 1 tablet by mouth Daily.   VIIBRYD 40 MG Tabs Generic drug:  Vilazodone HCl Take 1 tablet by mouth Daily.       Outstanding Labs/Studies   None  Duration of Discharge Encounter   Greater than 30 minutes including physician time.  Signed, Beatriz StallionKrista M. Zuleika Gallus PA-C 06/18/2018, 3:07 PM

## 2018-06-18 NOTE — Progress Notes (Signed)
R going level 0. Pt denies any pain and states he is ready for d/c. IV D/C. Discharge instructions reviewed with pt. Pt has no questions at this time.

## 2018-06-18 NOTE — Progress Notes (Signed)
Progress Note  Patient Name: Dylan SparrowRobert C Bruce Date of Encounter: 06/18/2018  Primary Cardiologist: No primary care provider on file.   Subjective   No angina. No problems at cath site. Cath shows occlusion of SVG-RCA (native RCA also occluded) and occlusion of SVG to D1 (D1 90% stenosis), but patent LIMA to LAD and SVG to OM. LVEF 50-55%, Normal EDP.  Inpatient Medications    Scheduled Meds: . [START ON 06/19/2018] amLODipine  10 mg Oral Daily  . aspirin EC  81 mg Oral Daily  . clonazePAM  1 mg Oral QHS  . clopidogrel  75 mg Oral Daily  . fenofibrate  160 mg Oral Daily  . gabapentin  300 mg Oral BID  . isosorbide mononitrate  30 mg Oral Daily  . mirtazapine  30 mg Oral QHS  . oxymetazoline  1 spray Each Nare BID  . pantoprazole  40 mg Oral Daily  . polycarbophil  625 mg Oral Daily  . simvastatin  40 mg Oral QPM  . sodium chloride flush  3 mL Intravenous Q12H   Continuous Infusions: . sodium chloride 75 mL/hr at 06/18/18 1200  . sodium chloride     PRN Meds: sodium chloride, acetaminophen, alum & mag hydroxide-simeth, cyclobenzaprine, morphine injection, nitroGLYCERIN, ondansetron (ZOFRAN) IV, oxyCODONE-acetaminophen, sodium chloride flush   Vital Signs    Vitals:   06/18/18 1045 06/18/18 1107 06/18/18 1207 06/18/18 1358  BP: (!) 181/99 (!) 145/98 (!) 154/89 (!) 147/82  Pulse: 73 69 70 79  Resp: 16 12    Temp:    98.1 F (36.7 C)  TempSrc:    Oral  SpO2: 98% 99% 99% 98%  Weight:      Height:        Intake/Output Summary (Last 24 hours) at 06/18/2018 1445 Last data filed at 06/18/2018 1300 Gross per 24 hour  Intake 346.31 ml  Output 400 ml  Net -53.69 ml   Filed Weights   06/17/18 1700 06/18/18 0602  Weight: 104.3 kg 101.7 kg    Telemetry    NSR - Personally Reviewed  ECG    NSR, unchanged lateral T wave inversion - Personally Reviewed  Physical Exam  Comfortable lying fully flat. Groin site without hematoma or ecchymosis GEN: No acute distress.     Neck: No JVD Cardiac: RRR, no murmurs, rubs, or gallops.  Respiratory: Clear to auscultation bilaterally. GI: Soft, nontender, non-distended  MS: No edema; No deformity. Neuro:  Nonfocal  Psych: Normal affect   Labs    Chemistry Recent Labs  Lab 06/17/18 2011 06/18/18 0245  NA  --  137  K  --  4.1  CL  --  104  CO2  --  21*  GLUCOSE  --  220*  BUN  --  21  CREATININE 1.09 1.06  CALCIUM  --  9.9  GFRNONAA >60 >60  GFRAA >60 >60  ANIONGAP  --  12     Hematology Recent Labs  Lab 06/17/18 2011 06/18/18 0245  WBC 9.0 8.1  RBC 4.85 4.84  HGB 12.9* 12.9*  HCT 40.2 40.0  MCV 82.9 82.6  MCH 26.6 26.7  MCHC 32.1 32.3  RDW 16.1* 16.0*  PLT 398 369    Cardiac Enzymes Recent Labs  Lab 06/17/18 2011 06/18/18 0245 06/18/18 0727  TROPONINI <0.03 <0.03 <0.03   No results for input(s): TROPIPOC in the last 168 hours.   BNPNo results for input(s): BNP, PROBNP in the last 168 hours.   DDimer No results  for input(s): DDIMER in the last 168 hours.   Radiology    No results found.  Cardiac Studies   Diagnostic Diagram        Patient Profile     72 y.o. male with CAD s/p CABG and PCI presenting with prolonged angina at rest, but normal troponin, found to have interval occlusion of previously stentedSVG to RCA, without option for PCI to either native vessel or graft. Plan medical therapy.  Assessment & Plan    1. Unstable angina:  Now asymptomatic. Plan ASA 81 mg indefinitely + clopidogrel 75 mg for 12 months. 2. CAD SVG-RCA occlusion: medical therapy. Increase amlodipine (his SBP is high) and add long acting nitrates.  3. HLP: On simvastatin with LDL 84. Switch to atorvastatin for higher efficacy and due to amlodipine drug interaction. Also consider dropping the tricor dose based on repeat lipid profile. 4. HTN: increase amlodipine.  Early follow up TOC 2 weeks or less.  For questions or updates, please contact [facility] Please consult [email or web address]  for contact info under Cardiology/STEMI.      Signed, Thurmon FairMihai Ashlea Dusing, MD  06/18/2018, 2:45 PM

## 2018-06-18 NOTE — Progress Notes (Signed)
ANTICOAGULATION CONSULT NOTE - Follow Up Consult  Pharmacy Consult for heparin Indication: USAP  Labs: Recent Labs    06/17/18 2011 06/18/18 0245  HGB 12.9* 12.9*  HCT 40.2 40.0  PLT 398 369  HEPARINUNFRC  --  0.15*  CREATININE 1.09  --   TROPONINI <0.03  --     Assessment: 72yo male subtherapeutic on heparin with initial dosing for CP.  Goal of Therapy:  Heparin level 0.3-0.7 units/ml   Plan:  Will rebolus with heparin 3000 units and increase heparin gtt by 3 units/kg/hr to 1600 units/hr and check level in 6 hours.    Vernard GamblesVeronda Sadye Kiernan, PharmD, BCPS  06/18/2018,4:16 AM

## 2018-06-18 NOTE — Interval H&P Note (Signed)
Cath Lab Visit (complete for each Cath Lab visit)  Clinical Evaluation Leading to the Procedure:   ACS: Yes.    Non-ACS:    Anginal Classification: CCS III  Anti-ischemic medical therapy: Minimal Therapy (1 class of medications)  Non-Invasive Test Results: No non-invasive testing performed  Prior CABG: Previous CABG      History and Physical Interval Note:  06/18/2018 9:31 AM  Dylan Bruce  has presented today for surgery, with the diagnosis of unstable angnia  The various methods of treatment have been discussed with the patient and family. After consideration of risks, benefits and other options for treatment, the patient has consented to  Procedure(s): LEFT HEART CATH AND CORS/GRAFTS ANGIOGRAPHY (N/A) as a surgical intervention .  The patient's history has been reviewed, patient examined, no change in status, stable for surgery.  I have reviewed the patient's chart and labs.  Questions were answered to the patient's satisfaction.     Nanetta BattyJonathan Tiwan Schnitker

## 2018-06-18 NOTE — Discharge Instructions (Signed)
PLEASE REMEMBER TO BRING ALL OF YOUR MEDICATIONS TO EACH OF YOUR FOLLOW-UP OFFICE VISITS.  PLEASE ATTEND ALL SCHEDULED FOLLOW-UP APPOINTMENTS.   Activity: Increase activity slowly as tolerated. You may shower, but no soaking baths (or swimming) for 1 week. No driving for 24 hours. No lifting over 5 lbs for 1 week. No sexual activity for 1 week.   You May Return to Work: in 1 week (if applicable)  Wound Care: You may wash cath site gently with soap and water. Keep cath site clean and dry. If you notice pain, swelling, bleeding or pus at your cath site, please call [phone].   Please restart your metformin on 06/20/18. This medication was held to protect your kidneys after undergoing a cardiac catheterization  Please stop taking simvastatin (zocor) - this medication has a potentially harmful interaction with your amlodipine. We have prescribed atorvastatin (lipitor) as an alternative cholesterol medication  Your home amlodipine was increased to 10mg  daily for better blood pressure control and can also help with chest pain.   Please take aspirin 81mg  and plavix 75mg  daily for at least 1 year to help prevent plaque buildup in your heart.

## 2018-06-18 NOTE — Progress Notes (Signed)
Right femoral artery sheath removed and pressure held for 20 minutes. Right distal pedal pulse palpable. Groin is level 0 with downtime starting at 1040 for 4 hours. No apparent complications and patient understands downtime instructions.

## 2018-06-20 DIAGNOSIS — F419 Anxiety disorder, unspecified: Secondary | ICD-10-CM | POA: Insufficient documentation

## 2018-08-07 ENCOUNTER — Encounter (HOSPITAL_COMMUNITY): Payer: BLUE CROSS/BLUE SHIELD

## 2018-08-07 ENCOUNTER — Ambulatory Visit: Payer: BLUE CROSS/BLUE SHIELD | Admitting: Vascular Surgery

## 2018-09-03 ENCOUNTER — Ambulatory Visit (HOSPITAL_COMMUNITY)
Admission: RE | Admit: 2018-09-03 | Discharge: 2018-09-03 | Disposition: A | Discharge status: Home / Self Care | Payer: BLUE CROSS/BLUE SHIELD | Source: Ambulatory Visit | Attending: Vascular Surgery | Admitting: Vascular Surgery

## 2018-09-03 ENCOUNTER — Ambulatory Visit (INDEPENDENT_AMBULATORY_CARE_PROVIDER_SITE_OTHER): Payer: BLUE CROSS/BLUE SHIELD | Admitting: Vascular Surgery

## 2018-09-03 ENCOUNTER — Ambulatory Visit (INDEPENDENT_AMBULATORY_CARE_PROVIDER_SITE_OTHER)
Admission: RE | Admit: 2018-09-03 | Discharge: 2018-09-03 | Disposition: A | Discharge status: Home / Self Care | Payer: BLUE CROSS/BLUE SHIELD | Source: Ambulatory Visit | Attending: Vascular Surgery | Admitting: Vascular Surgery

## 2018-09-03 ENCOUNTER — Encounter: Payer: Self-pay | Admitting: Vascular Surgery

## 2018-09-03 VITALS — BP 138/79 | HR 65 | Temp 97.7°F | Resp 18 | Ht 74.0 in | Wt 232.0 lb

## 2018-09-03 DIAGNOSIS — I70211 Atherosclerosis of native arteries of extremities with intermittent claudication, right leg: Secondary | ICD-10-CM

## 2018-09-03 NOTE — Progress Notes (Signed)
Patient name: Dylan Bruce MRN: [id number] DOB: 08/01/1946 Sex: male  REASON FOR VISIT: 49-month follow-up for PVD  HPI: Dylan Bruce is a 72 y.o. male with multiple medical problems who presents for a six-month follow-up for his PVD.  He was previously a patient of Dr. Nicky Pugh and was seen 6 months ago for lower extremity venous insufficiency as well as some claudication in his right leg.  Had been recommended for compression which he is wearing at times.  Today he reports no new issues over the last 6 months.  He says his legs are doing well overall.  Denies claudication, rest pain, non-helaing wounds.  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  His feet themselves do not really hurt.  He states his leg swelling that was previously a problem has really not been an issue over the last 6 months.  Past Medical History:  Diagnosis Date  . ARTHRITIS   . ASTHMA   . CAD (coronary artery disease)   . DM   . GERD   . HYPERLIPIDEMIA   . Hypertension   . PSORIASIS   . SLEEP APNEA   . SPINA BIFIDA     Past Surgical History:  Procedure Laterality Date  . CORONARY ARTERY BYPASS GRAFT    . LEFT HEART CATH AND CORS/GRAFTS ANGIOGRAPHY N/A 06/18/2018   Procedure: LEFT HEART CATH AND CORS/GRAFTS ANGIOGRAPHY;  Surgeon: Runell Gess, MD;  Location: [facility];  Service: Cardiovascular;  Laterality: N/A;    History reviewed. No pertinent family history.  SOCIAL HISTORY: Social History   Tobacco Use  . Smoking status: Never Smoker  . Smokeless tobacco: Never Used  Substance Use Topics  . Alcohol use: Yes    Allergies  Allergen Reactions  . Allopurinol   . Cefpodoxime   . Ceftin [Cefuroxime Axetil] Nausea And Vomiting  . Cefuroxime Nausea Only  . Doxycycline   . Fluocinolone   . Motrin [Ibuprofen]   . Probenecid   . Sulfamethoxazole-Trimethoprim   . Azithromycin Rash  .  Ciprofloxacin Rash  . Sulfa Antibiotics Rash    Current Outpatient Medications  Medication Sig Dispense Refill  . albuterol (PROVENTIL HFA;VENTOLIN HFA) 108 (90 Base) MCG/ACT inhaler Inhale 2 puffs into the lungs every 4 (four) hours as needed for wheezing.    Marland Kitchen amLODipine (NORVASC) 10 MG tablet Take 1 tablet (10 mg total) by mouth daily. 90 tablet 3  . aspirin EC 81 MG EC tablet Take 1 tablet (81 mg total) by mouth daily. 90 tablet 3  . atorvastatin (LIPITOR) 80 MG tablet Take 1 tablet (80 mg total) by mouth daily. 90 tablet 3  . BYDUREON 2 MG SUSR Inject 2 mg into the skin every 7 (seven) days.     . clobetasol cream (TEMOVATE) 0.05 % Apply 1 application topically as needed.     . clonazePAM (KLONOPIN) 1 MG tablet Take 1 tablet by mouth at bedtime as needed.  1  . clopidogrel (PLAVIX) 75 MG tablet Take 1 tablet (75 mg total) by mouth daily. 90 tablet 3  . COSENTYX SENSOREADY, 300 MG, 150 MG/ML SOAJ Inject 300 mg into the skin every 30 (thirty) days.    . cyclobenzaprine (FLEXERIL) 10 MG tablet Take 10 mg by mouth at bedtime.     Marland Kitchen desonide (DESOWEN) 0.05 % cream Apply 1 application topically as needed.    Marland Kitchen  docusate sodium (COLACE) 100 MG capsule Take 100 mg by mouth daily.     Marland Kitchen esomeprazole (NEXIUM) 40 MG capsule Take 40 mg by mouth daily before breakfast.    . ezetimibe (ZETIA) 10 MG tablet Take 10 mg by mouth daily.    Marland Kitchen FINACEA 15 % cream Apply 1 application topically 2 (two) times daily.    . FUROSEMIDE PO Take by mouth.    . gabapentin (NEURONTIN) 300 MG capsule Take 300 mg by mouth 2 (two) times daily.     Marland Kitchen glyBURIDE (DIABETA) 5 MG tablet Take 10 mg by mouth daily with breakfast.    . HUMIRA PEN 40 MG/0.8ML PNKT Take 40 mg by mouth every 14 (fourteen) days.    . hydrocortisone (ANUCORT-HC) 25 MG suppository Place 1 suppository rectally 2 (two) times daily as needed for hemorrhoids.    . insulin lispro (HUMALOG KWIKPEN) 100 UNIT/ML KiwkPen Inject 12 Units into the skin 3 (three)  times daily with meals.    . isosorbide mononitrate (IMDUR) 30 MG 24 hr tablet Take 1 tablet (30 mg total) by mouth daily. 30 tablet 3  . LANTUS SOLOSTAR 100 UNIT/ML Solostar Pen Inject 22 Units into the skin at bedtime.  5  . lisinopril (PRINIVIL,ZESTRIL) 10 MG tablet Take 10 mg by mouth daily.    Marland Kitchen LYRICA 100 MG capsule Take 100 mg by mouth 3 (three) times daily.  5  . metFORMIN (GLUCOPHAGE) 1000 MG tablet Take 1,000 mg by mouth 2 (two) times daily with a meal.    . mirtazapine (REMERON) 30 MG tablet Take 1 tablet by mouth daily.    . Multiple Vitamins-Minerals (MULTIVITAMIN PO) Take by mouth.    . nabumetone (RELAFEN) 750 MG tablet Take 1 tablet by mouth 2 (two) times daily.    . nitroGLYCERIN (NITROSTAT) 0.4 MG SL tablet Place 1 tablet (0.4 mg total) under the tongue every 5 (five) minutes as needed for chest pain. 90 tablet 3  . nystatin-triamcinolone (MYCOLOG II) cream Apply 1 application topically as needed.    Marland Kitchen oxyCODONE-acetaminophen (PERCOCET) 10-325 MG per tablet Take 1 tablet by mouth daily.    . polycarbophil (FIBERCON) 625 MG tablet Take 625 mg by mouth daily.    . Potassium 99 MG TABS Take 1 tablet by mouth daily.    . sodium fluoride (PREVIDENT) 1.1 % GEL dental gel Place 1 application onto teeth daily.     . SOOLANTRA 1 % CREA Apply 1 application topically at bedtime.  3  . tamsulosin (FLOMAX) 0.4 MG CAPS capsule Take 0.4 capsules by mouth daily.    . TRICOR 145 MG tablet Take 1 tablet by mouth daily.    Marland Kitchen ULORIC 40 MG tablet Take 1 tablet by mouth Daily.    Marland Kitchen VIIBRYD 40 MG TABS Take 1 tablet by mouth Daily.     No current facility-administered medications for this visit.     REVIEW OF SYSTEMS:  [X]  denotes positive finding, [ ]  denotes negative finding Cardiac  Comments:  Chest pain or chest pressure:    Shortness of breath upon exertion:    Short of breath when lying flat:    Irregular heart rhythm:        Vascular    Pain in calf, thigh, or hip brought on by  ambulation:    Pain in feet at night that wakes you up from your sleep:     Blood clot in your veins:    Leg swelling:  Pulmonary    Oxygen at home:    Productive cough:     Wheezing:         Neurologic    Sudden weakness in arms or legs:     Sudden numbness in arms or legs:     Sudden onset of difficulty speaking or slurred speech:    Temporary loss of vision in one eye:     Problems with dizziness:         Gastrointestinal    Blood in stool:     Vomited blood:         Genitourinary    Burning when urinating:     Blood in urine:        Psychiatric    Major depression:         Hematologic    Bleeding problems:    Problems with blood clotting too easily:        Skin    Rashes or ulcers:        Constitutional    Fever or chills:      PHYSICAL EXAM: Vitals:   09/03/18 1042  BP: 138/79  Pulse: 65  Resp: 18  Temp: 97.7 F (36.5 C)  TempSrc: Oral  SpO2: 98%  Weight: 105.2 kg  Height: 6\' 2"  (1.88 m)    GENERAL: The patient is a well-nourished male, in no acute distress. The vital signs are documented above. CARDIAC: There is a regular rate and rhythm.  VASCULAR:  2+ radial pulse palpable BUE 2+ femoral pulse palpable bilateral groins Biphasic R PT, Monophasic R DP signal Left DP/PT signals PULMONARY: There is good air exchange bilaterally without wheezing or rales. ABDOMEN: Soft and non-tender with normal pitched bowel sounds.  MUSCULOSKELETAL: There are no major deformities or cyanosis. NEUROLOGIC: No focal weakness or paresthesias are detected. SKIN: There are no ulcers or rashes noted.  DATA:   I independently reviewed his noninvasive vascular studies that show noncompressible ABIs with biphasic runoff in the right lower extremity via the posterior tibial and monophasic runoff in the anterior tibial and peroneal.  Assessment/Plan:  Overall Mr. Prather appears to be doing really well over the last 6 months.  He reports no new issues and his leg  swelling has really not been an issue over the last 6 months.  In addition he has no significant claudication rest pain or tissue loss (nonhealing).  He does have some abrasions on his shin that are healing.  I offered him follow-up again in 1 year just because he wants to continue surveillance for his legs in case they become an issue in the in the future.  We will repeat ABIs at that time.   Cephus Shelling, MD [facility] Office: [phone] Pager: [phone]    Cephus Shelling

## 2018-10-13 ENCOUNTER — Other Ambulatory Visit: Payer: Self-pay | Admitting: Medical

## 2018-10-14 NOTE — Telephone Encounter (Signed)
This is a [facility] pt. °

## 2018-10-14 NOTE — Telephone Encounter (Signed)
Rx request sent to pharmacy.  

## 2019-07-04 ENCOUNTER — Emergency Department (HOSPITAL_COMMUNITY)
Admission: EM | Admit: 2019-07-04 | Discharge: 2019-07-04 | Disposition: A | Discharge status: Home / Self Care | Payer: BC Managed Care – PPO | Attending: Emergency Medicine | Admitting: Emergency Medicine

## 2019-07-04 ENCOUNTER — Emergency Department (HOSPITAL_COMMUNITY): Payer: BC Managed Care – PPO

## 2019-07-04 ENCOUNTER — Encounter (HOSPITAL_COMMUNITY): Payer: Self-pay | Admitting: Emergency Medicine

## 2019-07-04 DIAGNOSIS — Z794 Long term (current) use of insulin: Secondary | ICD-10-CM | POA: Insufficient documentation

## 2019-07-04 DIAGNOSIS — R7989 Other specified abnormal findings of blood chemistry: Secondary | ICD-10-CM | POA: Insufficient documentation

## 2019-07-04 DIAGNOSIS — J45909 Unspecified asthma, uncomplicated: Secondary | ICD-10-CM | POA: Diagnosis not present

## 2019-07-04 DIAGNOSIS — I10 Essential (primary) hypertension: Secondary | ICD-10-CM | POA: Diagnosis not present

## 2019-07-04 DIAGNOSIS — R519 Headache, unspecified: Secondary | ICD-10-CM

## 2019-07-04 DIAGNOSIS — E119 Type 2 diabetes mellitus without complications: Secondary | ICD-10-CM | POA: Insufficient documentation

## 2019-07-04 DIAGNOSIS — I2510 Atherosclerotic heart disease of native coronary artery without angina pectoris: Secondary | ICD-10-CM | POA: Insufficient documentation

## 2019-07-04 DIAGNOSIS — Z951 Presence of aortocoronary bypass graft: Secondary | ICD-10-CM | POA: Diagnosis not present

## 2019-07-04 DIAGNOSIS — Z79899 Other long term (current) drug therapy: Secondary | ICD-10-CM | POA: Insufficient documentation

## 2019-07-04 DIAGNOSIS — H532 Diplopia: Secondary | ICD-10-CM | POA: Insufficient documentation

## 2019-07-04 DIAGNOSIS — R51 Headache: Secondary | ICD-10-CM | POA: Diagnosis not present

## 2019-07-04 DIAGNOSIS — Z7982 Long term (current) use of aspirin: Secondary | ICD-10-CM | POA: Diagnosis not present

## 2019-07-04 LAB — BASIC METABOLIC PANEL
Anion gap: 13 (ref 5–15)
BUN: 39 mg/dL — ABNORMAL HIGH (ref 8–23)
CO2: 25 mmol/L (ref 22–32)
Calcium: 10 mg/dL (ref 8.9–10.3)
Chloride: 101 mmol/L (ref 98–111)
Creatinine, Ser: 1.88 mg/dL — ABNORMAL HIGH (ref 0.61–1.24)
GFR calc Af Amer: 41 mL/min — ABNORMAL LOW (ref >60)
GFR calc non Af Amer: 35 mL/min — ABNORMAL LOW (ref >60)
Glucose, Bld: 192 mg/dL — ABNORMAL HIGH (ref 70–99)
Potassium: 4.7 mmol/L (ref 3.5–5.1)
Sodium: 139 mmol/L (ref 135–145)

## 2019-07-04 LAB — CBC WITH DIFFERENTIAL/PLATELET
Abs Immature Granulocytes: 0.02 10*3/uL (ref 0.00–0.07)
Basophils Absolute: 0.1 10*3/uL (ref 0.0–0.1)
Basophils Relative: 1 %
Eosinophils Absolute: 0.3 10*3/uL (ref 0.0–0.5)
Eosinophils Relative: 4 %
HCT: 41.6 % (ref 39.0–52.0)
Hemoglobin: 13.3 g/dL (ref 13.0–17.0)
Immature Granulocytes: 0 %
Lymphocytes Relative: 16 %
Lymphs Abs: 1.1 10*3/uL (ref 0.7–4.0)
MCH: 27.8 pg (ref 26.0–34.0)
MCHC: 32 g/dL (ref 30.0–36.0)
MCV: 86.8 fL (ref 80.0–100.0)
Monocytes Absolute: 0.9 10*3/uL (ref 0.1–1.0)
Monocytes Relative: 13 %
Neutro Abs: 4.1 10*3/uL (ref 1.7–7.7)
Neutrophils Relative %: 66 %
Platelets: 404 10*3/uL — ABNORMAL HIGH (ref 150–400)
RBC: 4.79 MIL/uL (ref 4.22–5.81)
RDW: 15.1 % (ref 11.5–15.5)
WBC: 6.4 10*3/uL (ref 4.0–10.5)
nRBC: 0 % (ref 0.0–0.2)

## 2019-07-04 LAB — I-STAT CREATININE, ED: Creatinine, Ser: 1.9 mg/dL — ABNORMAL HIGH (ref 0.61–1.24)

## 2019-07-04 LAB — SEDIMENTATION RATE: Sed Rate: 19 mm/hr — ABNORMAL HIGH (ref 0–16)

## 2019-07-04 MED ORDER — LORAZEPAM 2 MG/ML IJ SOLN
1.0000 mg | Freq: Once | INTRAMUSCULAR | Status: AC
  Administered 2019-07-04: 16:00:00 1 mg via INTRAVENOUS
  Filled 2019-07-04: qty 1

## 2019-07-04 NOTE — ED Notes (Signed)
Pt is requesting food and drink.  Pt states that he is diabetic and was able to check his own blood sugar it was @ 127 and is going down.

## 2019-07-04 NOTE — ED Notes (Signed)
Pt ambulatory to and from restroom with steady gait 

## 2019-07-04 NOTE — ED Notes (Signed)
Patient transported to MRI 

## 2019-07-04 NOTE — ED Provider Notes (Signed)
[facility] Provider Note   CSN: [id number] Arrival date & time: 07/04/19  1024     History   Chief Complaint Chief Complaint  Patient presents with  . Visual Field Change    HPI Dylan Bruce is a 73 y.o. male with history of CAD, GERD, hyperlipidemia, hypertension, diabetes, spina bifida, aortic stenosis presents sent from ophthalmologist for evaluation of acute onset, persistent diplopia and headaches for 1 week.  He reports that for the last week he has had both blurred vision and diplopia in the left eye.  He has also had left-sided frontal headaches which are mostly constant, radiate up to the crown and are described as a throbbing sensation.  He reports that they are different from his usual sinus headaches.  He denies fever, numbness, weakness of the extremities, nausea, vomiting, chest pain, shortness of breath, abdominal pain.  He went to see Dr. 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     The history is provided by the patient.    Past Medical History:  Diagnosis Date  . ARTHRITIS   . ASTHMA   . CAD (coronary artery disease)   . DM   . GERD   . HYPERLIPIDEMIA   . Hypertension   . PSORIASIS   . SLEEP APNEA   . SPINA BIFIDA     Patient Active Problem List   Diagnosis Date Noted  . Chest pain 06/17/2018  . Atherosclerosis of native arteries of extremity with intermittent claudication (HCC) 02/20/2018  . Essential hypertension 09/10/2013  . Bradycardia 08/14/2012  . Aortic stenosis 08/14/2012  . CAD (coronary artery disease) of artery bypass graft 07/17/2012  . 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 DM 09/03/2009  . Hyperlipidemia 09/03/2009  . ASTHMA 09/03/2009  . GERD 09/03/2009  . PSORIASIS 09/03/2009  . ARTHRITIS 09/03/2009  . SPINA BIFIDA 09/03/2009  . SLEEP APNEA 09/03/2009  . SHORTNESS OF  BREATH 09/03/2009    Past Surgical History:  Procedure Laterality Date  . CORONARY ARTERY BYPASS GRAFT    . LEFT HEART CATH AND CORS/GRAFTS ANGIOGRAPHY N/A 06/18/2018   Procedure: LEFT HEART CATH AND CORS/GRAFTS ANGIOGRAPHY;  Surgeon: Runell GessBerry, Jonathan J, MD;  Location: [facility] INVASIVE CV LAB;  Service: Cardiovascular;  Laterality: N/A;        Home Medications    Prior to Admission medications   Medication Sig Start Date End Date Taking? Authorizing Provider  albuterol (PROVENTIL HFA;VENTOLIN HFA) 108 (90 Base) MCG/ACT inhaler Inhale 2 puffs into the lungs every 4 (four) hours as needed for wheezing. 04/16/17  Yes [provider]  amLODipine (NORVASC) 10 MG tablet Take 1 tablet (10 mg total) by mouth daily. 06/19/18  Yes Kroeger, Ovidio KinKrista M., PA-C  aspirin EC 81 MG EC tablet Take 1 tablet (81 mg total) by mouth daily. 06/19/18  Yes Kroeger, Ovidio KinKrista M., PA-C  atorvastatin (LIPITOR) 80 MG tablet Take 1 tablet (80 mg total) by mouth daily. 06/18/18 07/04/19 Yes Kroeger, Ovidio KinKrista M., PA-C  BYDUREON 2 MG SUSR Inject 2 mg into the skin every Sunday.  08/15/13  Yes [provider]  clobetasol cream (TEMOVATE) 0.05 % Apply 1 application topically daily as needed (rash).  08/29/13  Yes [provider]  clopidogrel (PLAVIX) 75 MG tablet Take 1 tablet (75 mg total) by mouth daily. 06/18/18  Yes Kroeger, Dot LanesKrista M., PA-C  COSENTYX SENSOREADY, 300 MG, 150 MG/ML  SOAJ Inject 300 mg into the skin every 30 (thirty) days. 04/26/18  Yes [provider]  cyclobenzaprine (FLEXERIL) 10 MG tablet Take 10 mg by mouth at bedtime.  01/11/15  Yes [provider]  desonide (DESOWEN) 0.05 % cream Apply 1 application topically daily as needed (rash).    Yes [provider]  docusate sodium (COLACE) 100 MG capsule Take 100 mg by mouth daily.    Yes [provider]  esomeprazole (NEXIUM) 40 MG capsule Take 40 mg by mouth daily before breakfast.   Yes [provider]   ezetimibe (ZETIA) 10 MG tablet Take 10 mg by mouth daily. 07/13/17  Yes [provider]  FINACEA 15 % cream Apply 1 application topically 2 (two) times daily as needed (rash).  11/17/14  Yes [provider]  furosemide (LASIX) 40 MG tablet Take 40-80 mg by mouth See admin instructions. Take 80mg  by mouth each morning, then take 40mg  in the afternoon every other day.   Yes [provider]  gabapentin (NEURONTIN) 300 MG capsule Take 300 mg by mouth 2 (two) times daily.  08/15/13  Yes [provider]  glyBURIDE (DIABETA) 5 MG tablet Take 10 mg by mouth daily with breakfast.   Yes [provider]  hydrocortisone (ANUCORT-HC) 25 MG suppository Place 1 suppository rectally 2 (two) times daily as needed for hemorrhoids. 01/12/17  Yes [provider]  insulin lispro (HUMALOG KWIKPEN) 100 UNIT/ML KiwkPen Inject 18 Units into the skin 3 (three) times daily with meals.  07/16/17  Yes [provider]  isosorbide mononitrate (IMDUR) 30 MG 24 hr tablet Take 1 tablet (30 mg total) by mouth daily. Please schedule appointment for refills. 10/14/18  Yes Lewayne Bunting, MD  LANTUS SOLOSTAR 100 UNIT/ML Solostar Pen Inject 22 Units into the skin at bedtime. 04/29/18  Yes [provider]  lisinopril (PRINIVIL,ZESTRIL) 10 MG tablet Take 10 mg by mouth daily.   Yes [provider]  LYRICA 100 MG capsule Take 100 mg by mouth 2 (two) times daily.  05/16/18  Yes [provider]  mirtazapine (REMERON) 30 MG tablet Take 1 tablet by mouth at bedtime.  12/16/14  Yes [provider]  nabumetone (RELAFEN) 750 MG tablet Take 1 tablet by mouth 2 (two) times daily. 01/18/15  Yes [provider]  nitroGLYCERIN (NITROSTAT) 0.4 MG SL tablet Place 1 tablet (0.4 mg total) under the tongue every 5 (five) minutes as needed for chest pain. 08/14/12  Yes Lewayne Bunting, MD  nystatin-triamcinolone (MYCOLOG II) cream Apply 1 application topically  daily as needed (rash).    Yes [provider]  oxyCODONE-acetaminophen (PERCOCET) 10-325 MG per tablet Take 1-2 tablets by mouth 2 (two) times daily as needed for pain.    Yes [provider]  polycarbophil (FIBERCON) 625 MG tablet Take 625 mg by mouth daily.   Yes [provider]  Potassium 99 MG TABS Take 1 tablet by mouth daily.   Yes [provider]  sodium fluoride (PREVIDENT) 1.1 % GEL dental gel Place 1 application onto teeth daily.  11/08/16  Yes [provider]  SOOLANTRA 1 % CREA Apply 1 application topically at bedtime as needed (rash).  06/01/18  Yes [provider]  tamsulosin (FLOMAX) 0.4 MG CAPS capsule Take 0.4 capsules by mouth daily. 04/23/18  Yes [provider]  TRICOR 145 MG tablet Take 1 tablet by mouth daily. 12/24/14  Yes [provider]  ULORIC 40 MG tablet Take 1  tablet by mouth Daily. 06/04/12  Yes [provider]  VIIBRYD 40 MG TABS Take 40 mg by mouth Daily.  06/12/12  Yes [provider]    Family History No family history on file.  Social History Social History   Tobacco Use  . Smoking status: Never Smoker  . Smokeless tobacco: Never Used  Substance Use Topics  . Alcohol use: Yes  . Drug use: No     Allergies   Cefuroxime, Ibuprofen, Cefpodoxime, Ciprofloxacin, Doxycycline, Sulfamethoxazole-trimethoprim, Fluocinolone, Probenecid, Allopurinol, Azithromycin, and Sulfa antibiotics   Review of Systems Review of Systems  Constitutional: Positive for chills. Negative for fever.  Eyes: Positive for visual disturbance. Negative for photophobia.  Respiratory: Negative for shortness of breath.   Cardiovascular: Negative for chest pain.  Gastrointestinal: Negative for abdominal pain, nausea and vomiting.  Musculoskeletal: Negative for neck pain and neck stiffness.  Neurological: Positive for headaches. Negative for syncope, weakness and numbness.  All other systems reviewed  and are negative.    Physical Exam Updated Vital Signs BP (!) 146/86   Pulse (!) 59   Temp 98 F (36.7 C) (Oral)   Resp 17   SpO2 96%   Physical Exam Vitals signs and nursing note reviewed.  Constitutional:      General: He is not in acute distress.    Appearance: He is well-developed.  HENT:     Head: Normocephalic and atraumatic.  Eyes:     General:        Right eye: No discharge.        Left eye: No discharge.     Conjunctiva/sclera: Conjunctivae normal.  Neck:     Musculoskeletal: Normal range of motion and neck supple.     Vascular: No JVD.     Trachea: No tracheal deviation.  Cardiovascular:     Rate and Rhythm: Normal rate and regular rhythm.  Pulmonary:     Effort: Pulmonary effort is normal.     Breath sounds: Normal breath sounds.  Abdominal:     General: Bowel sounds are normal. There is no distension.     Palpations: Abdomen is soft.     Tenderness: There is no abdominal tenderness. There is no guarding or rebound.  Skin:    General: Skin is warm and dry.     Findings: No erythema.  Neurological:     Mental Status: He is alert and oriented to person, place, and time.     Cranial Nerves: No cranial nerve deficit.     Sensory: No sensory deficit.     Motor: No weakness.     Comments: Mental Status:  Alert, thought content appropriate, able to give a coherent history. Speech fluent without evidence of aphasia. Able to follow 2 step commands without difficulty.  Cranial Nerves:  II:  Peripheral visual fields grossly normal, pupils equal, round, reactive to light III,IV, VI: ptosis not present, extra-ocular motions intact bilaterally  V,VII: smile symmetric, facial light touch sensation equal VIII: hearing grossly normal to voice  X: uvula elevates symmetrically  XI: bilateral shoulder shrug symmetric and strong XII: midline tongue extension without fassiculations Motor:  Normal tone. 5/5 strength of BUE and BLE major muscle groups including strong and  equal grip strength and dorsiflexion/plantar flexion Sensory: light touch normal in all extremities. Cerebellar: normal finger-to-nose with bilateral upper extremities, Romberg sign absent Gait: Ambulatory with steady gait and balance, able to heel walk and toe walk without difficulty.  Psychiatric:        Behavior:  Behavior normal.      ED Treatments / Results  Labs (all labs ordered are listed, but only abnormal results are displayed) Labs Reviewed  BASIC METABOLIC PANEL - Abnormal; Notable for the following components:      Result Value   Glucose, Bld 192 (*)    BUN 39 (*)    Creatinine, Ser 1.88 (*)    GFR calc non Af Amer 35 (*)    GFR calc Af Amer 41 (*)    All other components within normal limits  CBC WITH DIFFERENTIAL/PLATELET - Abnormal; Notable for the following components:   Platelets 404 (*)    All other components within normal limits  I-STAT CREATININE, ED - Abnormal; Notable for the following components:   Creatinine, Ser 1.90 (*)    All other components within normal limits    EKG None  Radiology No results found.  Procedures Procedures (including critical care time)  Medications Ordered in ED Medications - No data to display   Initial Impression / Assessment and Plan / ED Course  I have reviewed the triage vital signs and the nursing notes.  Pertinent labs & imaging results that were available during my care of the patient were reviewed by me and considered in my medical decision making (see chart for details).        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  He is afebrile, vital signs are stable.  He is nontoxic in appearance.  No focal neurologic deficits noted on my examination.  Lab work reviewed by me shows no leukocytosis, no anemia, no metabolic derangements.  He does have an elevated creatinine and BUN with a GFR of 35.  This is elevated compared  to blood work from 1 year ago so unclear if elevation is acute or chronic.  He may need to follow-up with his PCP for reevaluation of his renal insufficiency.  12:53 PM Spoke with Dr. Alfredo BattyMattern with neuroradiology regarding the patient's kidney function.  He recommends MRA of the head without contrast which should be reasonable for evaluation of PCOM aneurysm without the use of contrast  3:30 PM Signed out to oncoming provider PA [place].  Pending imaging.  Plan to consult neurology after imaging is obtained for further recommendations regarding the patient's diplopia/blurred vision, and left-sided headaches.   Final Clinical Impressions(s) / ED Diagnoses   Final diagnoses:  Diplopia  Left-sided headache    ED Discharge Orders    None       Bennye AlmFawze, Hershel Corkery A, PA-C 07/04/19 1559    Tegeler, Canary Brimhristopher J, MD 07/04/19 [phone]

## 2019-07-04 NOTE — ED Notes (Signed)
Patient verbalizes understanding of discharge instructions. Opportunity for questioning and answers were provided. Pt discharged from ED. 

## 2019-07-04 NOTE — ED Triage Notes (Signed)
Patient sent by doctor to r/o PCOM aneurysm. Pt endorses double and blurred vision of L eye x 1 week as well as intermittent headaches.

## 2019-07-04 NOTE — ED Notes (Signed)
Pt ambulated to restroom. 

## 2019-07-04 NOTE — ED Provider Notes (Signed)
  Physical Exam  BP (!) 146/86   Pulse (!) 59   Temp 98 F (36.7 C) (Oral)   Resp 17   SpO2 96%   Physical Exam   Gen: resting comfortby in the bed in NAD Eyes: pupils equal. No obvious abnormality or disconjugate gaze with lateral movement of eyes.   ED Course/Procedures     Procedures  MDM   Pt signed out to me by Amada Kingfisher, PA-C. Please see previous notes for further history.   In brief, pt sent from ophthalmology (Dr. Tama High), for MRI. Pt with 1 wk h/o L eye blurry vision and diplopia. Associated L sided headache.  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 Labs show slight AKI, cannot obtain cta. As such, will order MRA without contrast and MRI to r/o PCOM aneurysm.  MRI and MRA negative for aneurysm.  Of note, patient's kidney function is slightly elevated.  Likely due to Lasix use.  Discussed slight restriction of NSAIDs and increasing hydration, close follow-up with PCP.  He is not having any urinary symptoms including retention or signs of infection.  Due to neurological symptoms, will discuss with neurology prior to discharge.  Discussed with Dr. 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  Recommends aspirin and follow-up with ophthalmology.  No further emergent imaging or intervention required at this time.  Discussed findings and plan with patient and wife.  At this time, patient appears safe for discharge.  Return precautions given.  Patient states he understands and agrees to plan.         Franchot Heidelberg, PA-C 07/04/19 1847    Lajean Saver, MD 07/04/19 2146

## 2019-07-04 NOTE — Discharge Instructions (Addendum)
Continue taking most of your home medications as prescribed. I would stop taking the extra dose of Lasix today and Sunday.  Make sure you are staying hydrated with water. Follow-up with your primary care doctor on Monday next week for recheck of your kidney function. Follow-up with the ophthalmologist at your scheduled appointment for recheck of your vision. Return to the emergency room with any new, worsening, concerning symptoms.

## 2019-07-31 ENCOUNTER — Other Ambulatory Visit: Payer: Self-pay | Admitting: Orthopedic Surgery

## 2019-08-13 ENCOUNTER — Encounter (HOSPITAL_BASED_OUTPATIENT_CLINIC_OR_DEPARTMENT_OTHER): Payer: Self-pay | Admitting: *Deleted

## 2019-08-13 ENCOUNTER — Other Ambulatory Visit: Payer: Self-pay

## 2019-08-13 NOTE — Progress Notes (Signed)
Reviewed pt's pmh, cardiac clearance, notes and recent lab results with Dr. Doroteo Glassman. Will need BMET at PAT appt. Pt to hold Plavix and Asprin for 5 days per Dr. Fredna Dow and cardiology. No further testing or clearance needed.

## 2019-08-18 ENCOUNTER — Encounter (HOSPITAL_BASED_OUTPATIENT_CLINIC_OR_DEPARTMENT_OTHER)
Admission: RE | Admit: 2019-08-18 | Discharge: 2019-08-18 | Disposition: A | Discharge status: Home / Self Care | Payer: BC Managed Care – PPO | Source: Ambulatory Visit | Attending: Orthopedic Surgery | Admitting: Orthopedic Surgery

## 2019-08-18 ENCOUNTER — Other Ambulatory Visit (HOSPITAL_COMMUNITY)
Admission: RE | Admit: 2019-08-18 | Discharge: 2019-08-18 | Disposition: A | Discharge status: Home / Self Care | Payer: BC Managed Care – PPO | Source: Ambulatory Visit | Attending: Orthopedic Surgery | Admitting: Orthopedic Surgery

## 2019-08-18 ENCOUNTER — Other Ambulatory Visit: Payer: Self-pay

## 2019-08-18 DIAGNOSIS — Z20828 Contact with and (suspected) exposure to other viral communicable diseases: Secondary | ICD-10-CM | POA: Diagnosis not present

## 2019-08-18 DIAGNOSIS — K219 Gastro-esophageal reflux disease without esophagitis: Secondary | ICD-10-CM | POA: Diagnosis not present

## 2019-08-18 DIAGNOSIS — Z791 Long term (current) use of non-steroidal anti-inflammatories (NSAID): Secondary | ICD-10-CM | POA: Diagnosis not present

## 2019-08-18 DIAGNOSIS — L4050 Arthropathic psoriasis, unspecified: Secondary | ICD-10-CM | POA: Diagnosis not present

## 2019-08-18 DIAGNOSIS — J45909 Unspecified asthma, uncomplicated: Secondary | ICD-10-CM | POA: Diagnosis not present

## 2019-08-18 DIAGNOSIS — E785 Hyperlipidemia, unspecified: Secondary | ICD-10-CM | POA: Diagnosis not present

## 2019-08-18 DIAGNOSIS — I130 Hypertensive heart and chronic kidney disease with heart failure and stage 1 through stage 4 chronic kidney disease, or unspecified chronic kidney disease: Secondary | ICD-10-CM | POA: Diagnosis not present

## 2019-08-18 DIAGNOSIS — Z01812 Encounter for preprocedural laboratory examination: Secondary | ICD-10-CM | POA: Diagnosis present

## 2019-08-18 DIAGNOSIS — M1990 Unspecified osteoarthritis, unspecified site: Secondary | ICD-10-CM | POA: Diagnosis not present

## 2019-08-18 DIAGNOSIS — G5621 Lesion of ulnar nerve, right upper limb: Secondary | ICD-10-CM | POA: Diagnosis not present

## 2019-08-18 DIAGNOSIS — Z794 Long term (current) use of insulin: Secondary | ICD-10-CM | POA: Diagnosis not present

## 2019-08-18 DIAGNOSIS — G5622 Lesion of ulnar nerve, left upper limb: Secondary | ICD-10-CM | POA: Diagnosis not present

## 2019-08-18 DIAGNOSIS — Q059 Spina bifida, unspecified: Secondary | ICD-10-CM | POA: Diagnosis not present

## 2019-08-18 DIAGNOSIS — Z79899 Other long term (current) drug therapy: Secondary | ICD-10-CM | POA: Diagnosis not present

## 2019-08-18 DIAGNOSIS — Z96612 Presence of left artificial shoulder joint: Secondary | ICD-10-CM | POA: Diagnosis not present

## 2019-08-18 DIAGNOSIS — Z7982 Long term (current) use of aspirin: Secondary | ICD-10-CM | POA: Diagnosis not present

## 2019-08-18 DIAGNOSIS — N189 Chronic kidney disease, unspecified: Secondary | ICD-10-CM | POA: Diagnosis not present

## 2019-08-18 DIAGNOSIS — Z882 Allergy status to sulfonamides status: Secondary | ICD-10-CM | POA: Diagnosis not present

## 2019-08-18 DIAGNOSIS — G4730 Sleep apnea, unspecified: Secondary | ICD-10-CM | POA: Diagnosis not present

## 2019-08-18 DIAGNOSIS — G5603 Carpal tunnel syndrome, bilateral upper limbs: Secondary | ICD-10-CM | POA: Diagnosis not present

## 2019-08-18 DIAGNOSIS — M65352 Trigger finger, left little finger: Secondary | ICD-10-CM | POA: Diagnosis not present

## 2019-08-18 DIAGNOSIS — E1140 Type 2 diabetes mellitus with diabetic neuropathy, unspecified: Secondary | ICD-10-CM | POA: Diagnosis not present

## 2019-08-18 DIAGNOSIS — I2510 Atherosclerotic heart disease of native coronary artery without angina pectoris: Secondary | ICD-10-CM | POA: Diagnosis not present

## 2019-08-18 DIAGNOSIS — M419 Scoliosis, unspecified: Secondary | ICD-10-CM | POA: Diagnosis not present

## 2019-08-18 DIAGNOSIS — E1122 Type 2 diabetes mellitus with diabetic chronic kidney disease: Secondary | ICD-10-CM | POA: Diagnosis not present

## 2019-08-18 DIAGNOSIS — M65322 Trigger finger, left index finger: Secondary | ICD-10-CM | POA: Diagnosis not present

## 2019-08-18 LAB — BASIC METABOLIC PANEL
Anion gap: 13 (ref 5–15)
BUN: 46 mg/dL — ABNORMAL HIGH (ref 8–23)
CO2: 21 mmol/L — ABNORMAL LOW (ref 22–32)
Calcium: 10 mg/dL (ref 8.9–10.3)
Chloride: 103 mmol/L (ref 98–111)
Creatinine, Ser: 2.05 mg/dL — ABNORMAL HIGH (ref 0.61–1.24)
GFR calc Af Amer: 37 mL/min — ABNORMAL LOW (ref >60)
GFR calc non Af Amer: 32 mL/min — ABNORMAL LOW (ref >60)
Glucose, Bld: 202 mg/dL — ABNORMAL HIGH (ref 70–99)
Potassium: 5.6 mmol/L — ABNORMAL HIGH (ref 3.5–5.1)
Sodium: 137 mmol/L (ref 135–145)

## 2019-08-18 LAB — SARS CORONAVIRUS 2 (TAT 6-24 HRS): SARS Coronavirus 2: NEGATIVE

## 2019-08-18 NOTE — Progress Notes (Signed)
Lab results reviewed with Dr. Whitman, will proceed with surgery as scheduled. 

## 2019-08-20 NOTE — Anesthesia Preprocedure Evaluation (Addendum)
Anesthesia Evaluation  Patient identified by MRN, date of birth, ID band Patient awake    Reviewed: Allergy & Precautions, H&P , NPO status , Patient's Chart, lab work & pertinent test results  Airway Mallampati: II  TM Distance: >3 FB Neck ROM: Full    Dental no notable dental hx. (+) Poor Dentition, Partial Upper, Missing   Pulmonary neg pulmonary ROS,    Pulmonary exam normal breath sounds clear to auscultation       Cardiovascular Exercise Tolerance: Good hypertension, + CAD, + Peripheral Vascular Disease and +CHF  negative cardio ROS Normal cardiovascular exam Rhythm:Regular Rate:Normal  ECHO 2/13  Left ventricle: The cavity size was normal. Wall thickness  was normal. Systolic function was normal. The estimated  ejection fraction was in the range of 60% to 65%.  - Aortic valve: There was mild stenosis.   Neuro/Psych negative neurological ROS  negative psych ROS   GI/Hepatic negative GI ROS, Neg liver ROS, GERD  ,  Endo/Other  negative endocrine ROSdiabetes  Renal/GU CRFRenal diseasenegative Renal ROS  negative genitourinary   Musculoskeletal negative musculoskeletal ROS (+)   Abdominal   Peds negative pediatric ROS (+)  Hematology negative hematology ROS (+)   Anesthesia Other Findings CAD s/p CABG in 2003, obstructive sleep apnea on CPAP and history of PAD.  His last cardiac catheterization was in 2010 at which time he underwent PCI to DES to PDA.  As part of the preop clearance, he underwent repeat echocardiogram and a stress test in 2018.  Echocardiogram obtained on 04/23/2017 showed EF 60 to 65%, mild to moderate LVH, grade 2 DD, mild aortic stenosis, dilated ascending aorta.  Myoview obtained in July 2018 showed large in size, severe, fixed defect involving basal inferolateral, basal anterolateral, mid inferolateral, mid anterolateral, and apical lateral segment consistent with his previous scar, no  significant ischemia was noted.    Reproductive/Obstetrics negative OB ROS                           Anesthesia Physical Anesthesia Plan  ASA: III  Anesthesia Plan: Regional and MAC   Post-op Pain Management:    Induction: Intravenous  PONV Risk Score and Plan: 1  Airway Management Planned: Mask, Natural Airway and Nasal Cannula  Additional Equipment:   Intra-op Plan:   Post-operative Plan:   Informed Consent: I have reviewed the patients History and Physical, chart, labs and discussed the procedure including the risks, benefits and alternatives for the proposed anesthesia with the patient or authorized representative who has indicated his/her understanding and acceptance.       Plan Discussed with: Anesthesiologist and CRNA  Anesthesia Plan Comments:        Anesthesia Quick Evaluation

## 2019-08-21 ENCOUNTER — Ambulatory Visit (HOSPITAL_BASED_OUTPATIENT_CLINIC_OR_DEPARTMENT_OTHER): Payer: BC Managed Care – PPO | Admitting: Anesthesiology

## 2019-08-21 ENCOUNTER — Encounter (HOSPITAL_BASED_OUTPATIENT_CLINIC_OR_DEPARTMENT_OTHER): Payer: Self-pay

## 2019-08-21 ENCOUNTER — Other Ambulatory Visit: Payer: Self-pay

## 2019-08-21 ENCOUNTER — Ambulatory Visit (HOSPITAL_BASED_OUTPATIENT_CLINIC_OR_DEPARTMENT_OTHER)
Admission: RE | Admit: 2019-08-21 | Discharge: 2019-08-21 | Disposition: A | Discharge status: Home / Self Care | Payer: BC Managed Care – PPO | Attending: Orthopedic Surgery | Admitting: Orthopedic Surgery

## 2019-08-21 ENCOUNTER — Encounter (HOSPITAL_BASED_OUTPATIENT_CLINIC_OR_DEPARTMENT_OTHER)
Admission: RE | Disposition: A | Discharge status: Home / Self Care | Payer: Self-pay | Source: Home / Self Care | Attending: Orthopedic Surgery

## 2019-08-21 DIAGNOSIS — K219 Gastro-esophageal reflux disease without esophagitis: Secondary | ICD-10-CM | POA: Insufficient documentation

## 2019-08-21 DIAGNOSIS — M65322 Trigger finger, left index finger: Secondary | ICD-10-CM | POA: Diagnosis not present

## 2019-08-21 DIAGNOSIS — Z888 Allergy status to other drugs, medicaments and biological substances status: Secondary | ICD-10-CM | POA: Insufficient documentation

## 2019-08-21 DIAGNOSIS — E785 Hyperlipidemia, unspecified: Secondary | ICD-10-CM | POA: Insufficient documentation

## 2019-08-21 DIAGNOSIS — L4050 Arthropathic psoriasis, unspecified: Secondary | ICD-10-CM | POA: Insufficient documentation

## 2019-08-21 DIAGNOSIS — I2510 Atherosclerotic heart disease of native coronary artery without angina pectoris: Secondary | ICD-10-CM | POA: Insufficient documentation

## 2019-08-21 DIAGNOSIS — E1151 Type 2 diabetes mellitus with diabetic peripheral angiopathy without gangrene: Secondary | ICD-10-CM | POA: Insufficient documentation

## 2019-08-21 DIAGNOSIS — M1990 Unspecified osteoarthritis, unspecified site: Secondary | ICD-10-CM | POA: Insufficient documentation

## 2019-08-21 DIAGNOSIS — E1122 Type 2 diabetes mellitus with diabetic chronic kidney disease: Secondary | ICD-10-CM | POA: Insufficient documentation

## 2019-08-21 DIAGNOSIS — Z87892 Personal history of anaphylaxis: Secondary | ICD-10-CM | POA: Insufficient documentation

## 2019-08-21 DIAGNOSIS — Z791 Long term (current) use of non-steroidal anti-inflammatories (NSAID): Secondary | ICD-10-CM | POA: Insufficient documentation

## 2019-08-21 DIAGNOSIS — G5603 Carpal tunnel syndrome, bilateral upper limbs: Secondary | ICD-10-CM | POA: Diagnosis not present

## 2019-08-21 DIAGNOSIS — Z882 Allergy status to sulfonamides status: Secondary | ICD-10-CM | POA: Insufficient documentation

## 2019-08-21 DIAGNOSIS — N189 Chronic kidney disease, unspecified: Secondary | ICD-10-CM | POA: Insufficient documentation

## 2019-08-21 DIAGNOSIS — Q059 Spina bifida, unspecified: Secondary | ICD-10-CM | POA: Insufficient documentation

## 2019-08-21 DIAGNOSIS — E1140 Type 2 diabetes mellitus with diabetic neuropathy, unspecified: Secondary | ICD-10-CM | POA: Insufficient documentation

## 2019-08-21 DIAGNOSIS — M65352 Trigger finger, left little finger: Secondary | ICD-10-CM | POA: Insufficient documentation

## 2019-08-21 DIAGNOSIS — Z96612 Presence of left artificial shoulder joint: Secondary | ICD-10-CM | POA: Insufficient documentation

## 2019-08-21 DIAGNOSIS — G5622 Lesion of ulnar nerve, left upper limb: Secondary | ICD-10-CM | POA: Diagnosis not present

## 2019-08-21 DIAGNOSIS — Z7982 Long term (current) use of aspirin: Secondary | ICD-10-CM | POA: Insufficient documentation

## 2019-08-21 DIAGNOSIS — I130 Hypertensive heart and chronic kidney disease with heart failure and stage 1 through stage 4 chronic kidney disease, or unspecified chronic kidney disease: Secondary | ICD-10-CM | POA: Insufficient documentation

## 2019-08-21 DIAGNOSIS — G4730 Sleep apnea, unspecified: Secondary | ICD-10-CM | POA: Insufficient documentation

## 2019-08-21 DIAGNOSIS — G5621 Lesion of ulnar nerve, right upper limb: Secondary | ICD-10-CM | POA: Insufficient documentation

## 2019-08-21 DIAGNOSIS — J45909 Unspecified asthma, uncomplicated: Secondary | ICD-10-CM | POA: Insufficient documentation

## 2019-08-21 DIAGNOSIS — M419 Scoliosis, unspecified: Secondary | ICD-10-CM | POA: Insufficient documentation

## 2019-08-21 DIAGNOSIS — Z951 Presence of aortocoronary bypass graft: Secondary | ICD-10-CM | POA: Insufficient documentation

## 2019-08-21 DIAGNOSIS — Z881 Allergy status to other antibiotic agents status: Secondary | ICD-10-CM | POA: Insufficient documentation

## 2019-08-21 DIAGNOSIS — Z794 Long term (current) use of insulin: Secondary | ICD-10-CM | POA: Insufficient documentation

## 2019-08-21 DIAGNOSIS — Z79899 Other long term (current) drug therapy: Secondary | ICD-10-CM | POA: Insufficient documentation

## 2019-08-21 HISTORY — PX: TRIGGER FINGER RELEASE: SHX641

## 2019-08-21 HISTORY — PX: CARPAL TUNNEL RELEASE: SHX101

## 2019-08-21 HISTORY — DX: Heart failure, unspecified: I50.9

## 2019-08-21 HISTORY — PX: ULNAR NERVE TRANSPOSITION: SHX2595

## 2019-08-21 LAB — GLUCOSE, CAPILLARY
Glucose-Capillary: 178 mg/dL — ABNORMAL HIGH (ref 70–99)
Glucose-Capillary: 172 mg/dL — ABNORMAL HIGH (ref 70–99)

## 2019-08-21 SURGERY — CARPAL TUNNEL RELEASE
Anesthesia: Monitor Anesthesia Care | Site: Wrist | Laterality: Left

## 2019-08-21 MED ORDER — DEXAMETHASONE SODIUM PHOSPHATE 10 MG/ML IJ SOLN
INTRAMUSCULAR | Status: AC
  Filled 2019-08-21: qty 1

## 2019-08-21 MED ORDER — ACETAMINOPHEN 325 MG PO TABS: 325.0000 mg | ORAL_TABLET | ORAL | Status: DC | PRN

## 2019-08-21 MED ORDER — EPHEDRINE 5 MG/ML INJ
INTRAVENOUS | Status: AC
  Filled 2019-08-21: qty 10

## 2019-08-21 MED ORDER — MIDAZOLAM HCL 2 MG/2ML IJ SOLN
INTRAMUSCULAR | Status: AC
  Filled 2019-08-21: qty 2

## 2019-08-21 MED ORDER — ONDANSETRON HCL 4 MG/2ML IJ SOLN
INTRAMUSCULAR | Status: DC | PRN
  Administered 2019-08-21: 4 mg via INTRAVENOUS

## 2019-08-21 MED ORDER — FENTANYL CITRATE (PF) 100 MCG/2ML IJ SOLN
50.0000 ug | INTRAMUSCULAR | Status: DC | PRN
  Administered 2019-08-21: 50 ug via INTRAVENOUS
  Administered 2019-08-21: 25 ug via INTRAVENOUS

## 2019-08-21 MED ORDER — LACTATED RINGERS IV SOLN
INTRAVENOUS | Status: DC
  Administered 2019-08-21: 08:00:00 via INTRAVENOUS

## 2019-08-21 MED ORDER — OXYCODONE HCL 5 MG/5ML PO SOLN: 5.0000 mg | Freq: Once | ORAL | Status: DC | PRN

## 2019-08-21 MED ORDER — VANCOMYCIN HCL IN DEXTROSE 1-5 GM/200ML-% IV SOLN
1000.0000 mg | INTRAVENOUS | Status: AC
  Administered 2019-08-21: 1000 mg via INTRAVENOUS

## 2019-08-21 MED ORDER — CHLORHEXIDINE GLUCONATE 4 % EX LIQD: 60.0000 mL | Freq: Once | CUTANEOUS | Status: DC

## 2019-08-21 MED ORDER — BUPIVACAINE-EPINEPHRINE (PF) 0.5% -1:200000 IJ SOLN
INTRAMUSCULAR | Status: DC | PRN
  Administered 2019-08-21: 20 mL via PERINEURAL

## 2019-08-21 MED ORDER — BUPIVACAINE LIPOSOME 1.3 % IJ SUSP
INTRAMUSCULAR | Status: DC | PRN
  Administered 2019-08-21: 10 mL via PERINEURAL

## 2019-08-21 MED ORDER — PROPOFOL 500 MG/50ML IV EMUL
INTRAVENOUS | Status: DC | PRN
  Administered 2019-08-21: 75 ug/kg/min via INTRAVENOUS

## 2019-08-21 MED ORDER — FENTANYL CITRATE (PF) 100 MCG/2ML IJ SOLN
INTRAMUSCULAR | Status: AC
  Filled 2019-08-21: qty 2

## 2019-08-21 MED ORDER — OXYCODONE HCL 5 MG PO TABS: 5.0000 mg | ORAL_TABLET | Freq: Once | ORAL | Status: DC | PRN

## 2019-08-21 MED ORDER — MIDAZOLAM HCL 2 MG/2ML IJ SOLN
0.5000 mg | INTRAMUSCULAR | Status: DC | PRN
  Administered 2019-08-21: 1 mg via INTRAVENOUS
  Administered 2019-08-21: 09:00:00 0.5 mg via INTRAVENOUS

## 2019-08-21 MED ORDER — FENTANYL CITRATE (PF) 100 MCG/2ML IJ SOLN: 25.0000 ug | INTRAMUSCULAR | Status: DC | PRN

## 2019-08-21 MED ORDER — PROPOFOL 10 MG/ML IV BOLUS
INTRAVENOUS | Status: AC
  Filled 2019-08-21: qty 20

## 2019-08-21 MED ORDER — DIPHENHYDRAMINE HCL 50 MG/ML IJ SOLN
INTRAMUSCULAR | Status: AC
  Filled 2019-08-21: qty 1

## 2019-08-21 MED ORDER — PROPOFOL 500 MG/50ML IV EMUL
INTRAVENOUS | Status: AC
  Filled 2019-08-21: qty 50

## 2019-08-21 MED ORDER — ACETAMINOPHEN 160 MG/5ML PO SOLN: 325.0000 mg | ORAL | Status: DC | PRN

## 2019-08-21 MED ORDER — SUCCINYLCHOLINE CHLORIDE 200 MG/10ML IV SOSY
PREFILLED_SYRINGE | INTRAVENOUS | Status: AC
  Filled 2019-08-21: qty 10

## 2019-08-21 MED ORDER — ONDANSETRON HCL 4 MG/2ML IJ SOLN: 4.0000 mg | Freq: Once | INTRAMUSCULAR | Status: DC | PRN

## 2019-08-21 MED ORDER — ONDANSETRON HCL 4 MG/2ML IJ SOLN
INTRAMUSCULAR | Status: AC
  Filled 2019-08-21: qty 2

## 2019-08-21 MED ORDER — OXYCODONE-ACETAMINOPHEN 10-325 MG PO TABS: 1.0000 | ORAL_TABLET | Freq: Four times a day (QID) | ORAL | 0 refills | Status: AC | PRN

## 2019-08-21 MED ORDER — MEPERIDINE HCL 25 MG/ML IJ SOLN: 6.2500 mg | INTRAMUSCULAR | Status: DC | PRN

## 2019-08-21 MED ORDER — SODIUM CHLORIDE 0.9 % IV SOLN
INTRAVENOUS | Status: DC | PRN
  Administered 2019-08-21: 40 ug/min via INTRAVENOUS

## 2019-08-21 MED ORDER — LIDOCAINE 2% (20 MG/ML) 5 ML SYRINGE
INTRAMUSCULAR | Status: AC
  Filled 2019-08-21: qty 5

## 2019-08-21 MED ORDER — PHENYLEPHRINE 40 MCG/ML (10ML) SYRINGE FOR IV PUSH (FOR BLOOD PRESSURE SUPPORT)
PREFILLED_SYRINGE | INTRAVENOUS | Status: AC
  Filled 2019-08-21: qty 10

## 2019-08-21 MED ORDER — EPHEDRINE SULFATE 50 MG/ML IJ SOLN
INTRAMUSCULAR | Status: DC | PRN
  Administered 2019-08-21: 10 mg via INTRAVENOUS

## 2019-08-21 MED ORDER — VANCOMYCIN HCL IN DEXTROSE 1-5 GM/200ML-% IV SOLN
INTRAVENOUS | Status: AC
  Filled 2019-08-21: qty 200

## 2019-08-21 SURGICAL SUPPLY — 54 items
BLADE MINI RND TIP GREEN BEAV (BLADE) ×6 IMPLANT
BLADE SURG 15 STRL LF DISP TIS (BLADE) ×4 IMPLANT
BLADE SURG 15 STRL SS (BLADE) ×2
BNDG COHESIVE 2X5 TAN STRL LF (GAUZE/BANDAGES/DRESSINGS) ×6 IMPLANT
BNDG COHESIVE 3X5 TAN STRL LF (GAUZE/BANDAGES/DRESSINGS) ×12 IMPLANT
BNDG ESMARK 4X9 LF (GAUZE/BANDAGES/DRESSINGS) ×6 IMPLANT
BNDG GAUZE ELAST 4 BULKY (GAUZE/BANDAGES/DRESSINGS) ×6 IMPLANT
CHLORAPREP W/TINT 26 (MISCELLANEOUS) ×6 IMPLANT
CORD BIPOLAR FORCEPS 12FT (ELECTRODE) ×6 IMPLANT
COVER BACK TABLE REUSABLE LG (DRAPES) ×6 IMPLANT
COVER MAYO STAND REUSABLE (DRAPES) ×6 IMPLANT
COVER WAND RF STERILE (DRAPES) IMPLANT
CUFF TOURN SGL QUICK 18X3 (MISCELLANEOUS) ×6 IMPLANT
CUFF TOURN SGL QUICK 18X4 (TOURNIQUET CUFF) ×6 IMPLANT
DECANTER SPIKE VIAL GLASS SM (MISCELLANEOUS) IMPLANT
DRAPE EXTREMITY T 121X128X90 (DISPOSABLE) ×6 IMPLANT
DRAPE SURG 17X23 STRL (DRAPES) ×6 IMPLANT
DRSG PAD ABDOMINAL 8X10 ST (GAUZE/BANDAGES/DRESSINGS) ×6 IMPLANT
GAUZE 4X4 16PLY RFD (DISPOSABLE) IMPLANT
GAUZE SPONGE 4X4 12PLY STRL (GAUZE/BANDAGES/DRESSINGS) ×6 IMPLANT
GAUZE XEROFORM 1X8 LF (GAUZE/BANDAGES/DRESSINGS) ×6 IMPLANT
GLOVE BIO SURGEON STRL SZ7 (GLOVE) ×6 IMPLANT
GLOVE BIOGEL PI IND STRL 7.0 (GLOVE) ×8 IMPLANT
GLOVE BIOGEL PI IND STRL 8 (GLOVE) ×4 IMPLANT
GLOVE BIOGEL PI IND STRL 8.5 (GLOVE) ×4 IMPLANT
GLOVE BIOGEL PI INDICATOR 7.0 (GLOVE) ×4
GLOVE BIOGEL PI INDICATOR 8 (GLOVE) ×2
GLOVE BIOGEL PI INDICATOR 8.5 (GLOVE) ×2
GLOVE SURG ORTHO 8.0 STRL STRW (GLOVE) ×6 IMPLANT
GOWN STRL REUS W/ TWL LRG LVL3 (GOWN DISPOSABLE) ×4 IMPLANT
GOWN STRL REUS W/TWL LRG LVL3 (GOWN DISPOSABLE) ×2
GOWN STRL REUS W/TWL XL LVL3 (GOWN DISPOSABLE) ×12 IMPLANT
LOOP VESSEL MAXI BLUE (MISCELLANEOUS) IMPLANT
NEEDLE PRECISIONGLIDE 27X1.5 (NEEDLE) IMPLANT
NS IRRIG 1000ML POUR BTL (IV SOLUTION) ×6 IMPLANT
PACK BASIN DAY SURGERY FS (CUSTOM PROCEDURE TRAY) ×6 IMPLANT
PAD CAST 3X4 CTTN HI CHSV (CAST SUPPLIES) ×4 IMPLANT
PAD CAST 4YDX4 CTTN HI CHSV (CAST SUPPLIES) ×4 IMPLANT
PADDING CAST ABS 4INX4YD NS (CAST SUPPLIES) ×2
PADDING CAST ABS COTTON 4X4 ST (CAST SUPPLIES) ×4 IMPLANT
PADDING CAST COTTON 3X4 STRL (CAST SUPPLIES) ×2
PADDING CAST COTTON 4X4 STRL (CAST SUPPLIES) ×2
SLEEVE SCD COMPRESS KNEE MED (MISCELLANEOUS) ×6 IMPLANT
SPLINT PLASTER CAST XFAST 3X15 (CAST SUPPLIES) IMPLANT
SPLINT PLASTER XTRA FASTSET 3X (CAST SUPPLIES)
STOCKINETTE 4X48 STRL (DRAPES) ×6 IMPLANT
SUT ETHILON 4 0 PS 2 18 (SUTURE) ×12 IMPLANT
SUT VIC AB 2-0 SH 27 (SUTURE) ×2
SUT VIC AB 2-0 SH 27XBRD (SUTURE) ×4 IMPLANT
SUT VICRYL 4-0 PS2 18IN ABS (SUTURE) ×6 IMPLANT
SYR BULB 3OZ (MISCELLANEOUS) ×6 IMPLANT
SYR CONTROL 10ML LL (SYRINGE) IMPLANT
TOWEL GREEN STERILE FF (TOWEL DISPOSABLE) ×12 IMPLANT
UNDERPAD 30X36 HEAVY ABSORB (UNDERPADS AND DIAPERS) ×6 IMPLANT

## 2019-08-21 NOTE — Anesthesia Postprocedure Evaluation (Signed)
Anesthesia Post Note  Patient: Dylan Bruce  Procedure(s) Performed: CARPAL TUNNEL RELEASE (Left Wrist) DECOMPRESSION WITH ULNAR NERVE LEFT CUBITAL TUNNEL ULNAR (Left Elbow) DECOMPRESSION ULNAR NERVE GUYON'S CANAL RELEASE (Left Arm Lower) RELEASE TRIGGER LEFT SMALL FINGER LEFT INDEX (Left Hand)     Patient location during evaluation: PACU Anesthesia Type: Regional and MAC Level of consciousness: awake and alert Pain management: pain level controlled Vital Signs Assessment: post-procedure vital signs reviewed and stable Respiratory status: spontaneous breathing, nonlabored ventilation, respiratory function stable and patient connected to nasal cannula oxygen Cardiovascular status: stable and blood pressure returned to baseline Postop Assessment: no apparent nausea or vomiting Anesthetic complications: no    Last Vitals:  Vitals:   08/21/19 1007 08/21/19 1008  BP:  101/78  Pulse: 84 85  Resp: (!) 9 16  Temp:  36.6 C  SpO2: 96% 96%    Last Pain:  Vitals:   08/21/19 1008  TempSrc:   PainSc: 0-No pain                 Flora Parks

## 2019-08-21 NOTE — Brief Op Note (Signed)
08/21/2019  10:01 AM  PATIENT:  Dylan Bruce  73 y.o. male  PRE-OPERATIVE DIAGNOSIS:  LEFT CARPAL TUNNEL SYNDROME LEFT CUBITAL TUNNEL SYNDROME LEFT SMALL  And INDEX FINGER TRIGGER DIGIT  POST-OPERATIVE DIAGNOSIS:  LEFT CARPAL TUNNEL SYNDROME LEFT CUBITAL TUNNEL SYNDROME LEFT SMALL AND index  FINGER TRIGGER DIGIT  PROCEDURE:  Procedure(s) with comments: CARPAL TUNNEL RELEASE (Left) - AXILLARY BLOCK DECOMPRESSION WITH ULNAR NERVE LEFT CUBITAL TUNNEL ULNAR (Left) DECOMPRESSION ULNAR NERVE GUYON'S CANAL RELEASE (Left) RELEASE TRIGGER LEFT SMALL FINGER LEFT INDEX (Left)  SURGEON:  Surgeon(s) and Role:    * Daryll Brod, MD - Primary    * Leanora Cover, MD  PHYSICIAN ASSISTANT:   ASSISTANTS: k Alvera Tourigny,md   ANESTHESIA:   regional and IV sedation  EBL:  3ML  BLOOD ADMINISTERED:none  DRAINS: none   LOCAL MEDICATIONS USED:  NONE  SPECIMEN:  No Specimen  DISPOSITION OF SPECIMEN:  N/A  COUNTS:  YES  TOURNIQUET:   Total Tourniquet Time Documented: Upper Arm (Left) - 50 minutes Total: Upper Arm (Left) - 50 minutes   DICTATION: .[name] Dictation  PLAN OF CARE: Discharge to home after PACU  PATIENT DISPOSITION:  PACU - hemodynamically stable.

## 2019-08-21 NOTE — Anesthesia Procedure Notes (Signed)
Anesthesia Regional Block: Supraclavicular block   Pre-Anesthetic Checklist: ,, timeout performed, Correct Patient, Correct Site, Correct Laterality, Correct Procedure, Correct Position, site marked, Risks and benefits discussed,  Surgical consent,  Pre-op evaluation,  At surgeon's request and post-op pain management  Laterality: Left  Prep: chloraprep       Needles:  Injection technique: Single-shot  Needle Type: Echogenic Stimulator Needle     Needle Length: 5cm  Needle Gauge: 22     Additional Needles:   Procedures:, nerve stimulator,,, ultrasound used (permanent image in chart),,,,   Nerve Stimulator or Paresthesia:  Response: hand, 0.45 mA,   Additional Responses:   Narrative:  Start time: 08/21/2019 8:27 AM End time: 08/21/2019 8:32 AM Injection made incrementally with aspirations every 5 mL.  Performed by: Personally  Anesthesiologist: Janeece Riggers, MD  Additional Notes: Functioning IV was confirmed and monitors were applied.  A 59mm 22ga Arrow echogenic stimulator needle was used. Sterile prep and drape,hand hygiene and sterile gloves were used. Ultrasound guidance: relevant anatomy identified, needle position confirmed, local anesthetic spread visualized around nerve(s)., vascular puncture avoided.  Image printed for medical record. Negative aspiration and negative test dose prior to incremental administration of local anesthetic. The patient tolerated the procedure well.

## 2019-08-21 NOTE — H&P (Signed)
Dylan Bruce is an 73 y.o. male.   Chief Complaint: catching index and small finger left hand with numbness and tingling of the hand HPI: Dylan Bruce is a 73 year old right-hand-dominant former patient who has  numbness and tingling in both hands. He is referred by Dr. Mikey Bussing. He states that the numbness and tingling is constant. He has a history of psoriatic arthritis. He has had procedures done in the past for ruptured extensor tendons following injections for psoriatic nail problems. He has been wearing a brace which has helped it wakes him up as 7 out of 7 nights. All the fingers are involved. She states shaking them helps. Has had a left total shoulder done in the past. He has a history of diabetes arthritis and gout. He has no history of thyroid problems. Family history is positive for each of these. He states nothing seems to make it better or worse. He also has low back problems and scoliosis. He also complains of catching of his left small finger. His nerve conductions reveal the pression of the ulnar nerve at his elbow and wrist and the median nerve at his wrist on his left side has similar problems on his right side but no triggering. Nothing makes it better or worse. It wakes him every night. His nerve conductions reveal no sensory response to either the nerves motor delays of 11 and 9 left and right. And diminution of velocity of his ulnar nerve at the elbow. He has developed triggering of his left index finger.   Past Medical History:  Diagnosis Date  . ARTHRITIS   . ASTHMA   . CAD (coronary artery disease)   . CHF (congestive heart failure) (HCC)   . DM   . GERD   . HYPERLIPIDEMIA   . Hypertension   . PSORIASIS   . SLEEP APNEA   . SPINA BIFIDA     Past Surgical History:  Procedure Laterality Date  . BACK SURGERY    . CORONARY ARTERY BYPASS GRAFT    . JOINT REPLACEMENT Left    shoulder  . LEFT HEART CATH AND CORS/GRAFTS ANGIOGRAPHY N/A 06/18/2018   Procedure: LEFT HEART CATH  AND CORS/GRAFTS ANGIOGRAPHY;  Surgeon: Runell Gess, MD;  Location: [facility];  Service: Cardiovascular;  Laterality: N/A;    History reviewed. No pertinent family history. Social History:  reports that he has never smoked. He has never used smokeless tobacco. He reports current alcohol use. He reports that he does not use drugs.  Allergies:  Allergies  Allergen Reactions  . Cefuroxime Diarrhea and Nausea And Vomiting    Severe  . Ibuprofen Anaphylaxis and Shortness Of Breath       . Cefpodoxime Rash  . Ciprofloxacin Rash  . Doxycycline Rash  . Sulfamethoxazole-Trimethoprim Rash  . Fluocinolone Other (See Comments)    unknown  . Probenecid Other (See Comments)    unknown  . Allopurinol Rash  . Azithromycin Rash  . Sulfa Antibiotics Rash    Medications Prior to Admission  Medication Sig Dispense Refill  . albuterol (PROVENTIL HFA;VENTOLIN HFA) 108 (90 Base) MCG/ACT inhaler Inhale 2 puffs into the lungs every 4 (four) hours as needed for wheezing.    Marland Kitchen amLODipine (NORVASC) 10 MG tablet Take 1 tablet (10 mg total) by mouth daily. 90 tablet 3  . aspirin EC 81 MG EC tablet Take 1 tablet (81 mg total) by mouth daily. 90 tablet 3  . atorvastatin (LIPITOR) 80 MG tablet Take 1 tablet (80  mg total) by mouth daily. 90 tablet 3  . BYDUREON 2 MG SUSR Inject 2 mg into the skin every Sunday.     . clobetasol cream (TEMOVATE) 8.84 % Apply 1 application topically daily as needed (rash).     . clopidogrel (PLAVIX) 75 MG tablet Take 1 tablet (75 mg total) by mouth daily. 90 tablet 3  . cyclobenzaprine (FLEXERIL) 10 MG tablet Take 10 mg by mouth at bedtime.     Marland Kitchen desonide (DESOWEN) 0.05 % cream Apply 1 application topically daily as needed (rash).     Marland Kitchen docusate sodium (COLACE) 100 MG capsule Take 100 mg by mouth daily.     Marland Kitchen esomeprazole (NEXIUM) 40 MG capsule Take 40 mg by mouth daily before breakfast.    . ezetimibe (ZETIA) 10 MG tablet Take 10 mg by mouth daily.    Marland Kitchen FINACEA 15  % cream Apply 1 application topically 2 (two) times daily as needed (rash).     . furosemide (LASIX) 40 MG tablet Take 40-80 mg by mouth See admin instructions. Take 80mg  by mouth each morning, then take 40mg  in the afternoon every other day.    . glyBURIDE (DIABETA) 5 MG tablet Take 10 mg by mouth daily with breakfast.    . hydrocortisone (ANUCORT-HC) 25 MG suppository Place 1 suppository rectally 2 (two) times daily as needed for hemorrhoids.    . insulin lispro (HUMALOG KWIKPEN) 100 UNIT/ML KiwkPen Inject 18 Units into the skin 3 (three) times daily with meals.     . isosorbide mononitrate (IMDUR) 30 MG 24 hr tablet Take 1 tablet (30 mg total) by mouth daily. Please schedule appointment for refills. 30 tablet 0  . LANTUS SOLOSTAR 100 UNIT/ML Solostar Pen Inject 22 Units into the skin at bedtime.  5  . lisinopril (PRINIVIL,ZESTRIL) 10 MG tablet Take 10 mg by mouth daily.    Marland Kitchen LYRICA 100 MG capsule Take 100 mg by mouth 2 (two) times daily.   5  . metoprolol tartrate (LOPRESSOR) 50 MG tablet Take 50 mg by mouth 2 (two) times daily.    . mirtazapine (REMERON) 30 MG tablet Take 1 tablet by mouth at bedtime.     . nabumetone (RELAFEN) 750 MG tablet Take 1 tablet by mouth 2 (two) times daily.    Marland Kitchen nystatin-triamcinolone (MYCOLOG II) cream Apply 1 application topically daily as needed (rash).     Marland Kitchen oxyCODONE-acetaminophen (PERCOCET) 10-325 MG per tablet Take 1-2 tablets by mouth 2 (two) times daily as needed for pain.     . polycarbophil (FIBERCON) 625 MG tablet Take 625 mg by mouth daily.    . Potassium 99 MG TABS Take 1 tablet by mouth daily.    . sodium fluoride (PREVIDENT) 1.1 % GEL dental gel Place 1 application onto teeth daily.     . SOOLANTRA 1 % CREA Apply 1 application topically at bedtime as needed (rash).   3  . tamsulosin (FLOMAX) 0.4 MG CAPS capsule Take 0.4 capsules by mouth daily.    . TRICOR 145 MG tablet Take 1 tablet by mouth daily.    Marland Kitchen ULORIC 40 MG tablet Take 1 tablet by mouth  Daily.    Marland Kitchen VIIBRYD 40 MG TABS Take 40 mg by mouth Daily.     . COSENTYX SENSOREADY, 300 MG, 150 MG/ML SOAJ Inject 300 mg into the skin every 30 (thirty) days.    . nitroGLYCERIN (NITROSTAT) 0.4 MG SL tablet Place 1 tablet (0.4 mg total) under the tongue every 5 (  five) minutes as needed for chest pain. 90 tablet 3    Results for orders placed or performed during the hospital encounter of 08/21/19 (from the past 48 hour(s))  Glucose, capillary     Status: Abnormal   Collection Time: 08/21/19  8:05 AM  Result Value Ref Range   Glucose-Capillary 178 (H) 70 - 99 mg/dL    No results found.   Pertinent items are noted in HPI.  Blood pressure 108/72, pulse 75, temperature 98.5 F (36.9 C), temperature source Oral, resp. rate (!) 22, height 6\' 2"  (1.88 m), weight 112.5 kg, SpO2 99 %.  General appearance: alert, cooperative and appears stated age Head: Normocephalic, without obvious abnormality Neck: no JVD Resp: clear to auscultation bilaterally Cardio: regular rate and rhythm, S1, S2 normal, no murmur, click, rub or gallop GI: soft, non-tender; bowel sounds normal; no masses,  no organomegaly Extremities: numbness left hand trigger index and small fingers Pulses: 2+ and symmetric Skin: Skin color, texture, turgor normal. No rashes or lesions Neurologic: Grossly normal Incision/Wound: na  Assessment/Plan  Assessment:  1. Bilateral carpal tunnel syndrome  2. Entrapment of right ulnar nerve  3. 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 Trigger index left and small  Plan: He would like to proceed with surgical intervention with a release of the carpal canal release of the ulnar nerve possible transposition at the elbow. Release of Guyon's canal and release of the A1 pulley of the left small finger. Pre-peri-and postoperative course are discussed along with risk and complications. He is aware that there  is no guarantee to the surgery the possibility of infection recurrence injury to arteries nerves tendons complete relief symptoms dystrophy he is advised that we are attempting to halt the process and giving the nerve the opportunity to get better but it cannot be made to get better. Like to proceed questions are encouraged and answered to his satisfaction. The schedule as an outpatient under regional anesthesia left arm for decompression possible transposition ulnar nerve at the elbow release of Guyon's canal carpal tunnel release and release A1 pulley left small and index fingers.   Cindee SaltGary Graysen Woodyard 08/21/2019, 8:30 AM

## 2019-08-21 NOTE — Op Note (Signed)
I assisted Surgeon(s) and Role:    * Daryll Brod, MD - Primary    * Leanora Cover, MD on the Procedure(s): CARPAL TUNNEL RELEASE DECOMPRESSION WITH ULNAR NERVE LEFT CUBITAL TUNNEL ULNAR DECOMPRESSION ULNAR NERVE GUYON'S CANAL RELEASE RELEASE TRIGGER LEFT SMALL FINGER LEFT INDEX on 08/21/2019.  I provided assistance on this case as follows: retraction soft tissues, positioning of arm.  Electronically signed by: Leanora Cover, MD Date: 08/21/2019 Time: 10:01 AM

## 2019-08-21 NOTE — Progress Notes (Signed)
Assisted Dr. Oddono with left, ultrasound guided, supraclavicular block. Side rails up, monitors on throughout procedure. See vital signs in flow sheet. Tolerated Procedure well. 

## 2019-08-21 NOTE — Discharge Instructions (Addendum)
Hand Center Instructions Hand Surgery  Wound Care: Keep your hand elevated above the level of your heart.  Do not allow it to dangle by your side.  Keep the dressing dry and do not remove it unless your doctor advises you to do so.  He will usually change it at the time of your post-op visit.  Moving your fingers is advised to stimulate circulation but will depend on the site of your surgery.  If you have a splint applied, your doctor will advise you regarding movement.  Activity: Do not drive or operate machinery today.  Rest today and then you may return to your normal activity and work as indicated by your physician.  Diet:  Drink liquids today or eat a light diet.  You may resume a regular diet tomorrow.    General expectations: Pain for two to three days. Fingers may become slightly swollen.  Call your doctor if any of the following occur: Severe pain not relieved by pain medication. Elevated temperature. Dressing soaked with blood. Inability to move fingers. White or bluish color to fingers.   Post Anesthesia Home Care Instructions  Activity: Get plenty of rest for the remainder of the day. A responsible individual must stay with you for 24 hours following the procedure.  For the next 24 hours, DO NOT: -Drive a car -Operate machinery -Drink alcoholic beverages -Take any medication unless instructed by your physician -Make any legal decisions or sign important papers.  Meals: Start with liquid foods such as gelatin or soup. Progress to regular foods as tolerated. Avoid greasy, spicy, heavy foods. If nausea and/or vomiting occur, drink only clear liquids until the nausea and/or vomiting subsides. Call your physician if vomiting continues.  Special Instructions/Symptoms: Your throat may feel dry or sore from the anesthesia or the breathing tube placed in your throat during surgery. If this causes discomfort, gargle with warm salt water. The discomfort should disappear within  24 hours.  If you had a scopolamine patch placed behind your ear for the management of post- operative nausea and/or vomiting:  1. The medication in the patch is effective for 72 hours, after which it should be removed.  Wrap patch in a tissue and discard in the trash. Wash hands thoroughly with soap and water. 2. You may remove the patch earlier than 72 hours if you experience unpleasant side effects which may include dry mouth, dizziness or visual disturbances. 3. Avoid touching the patch. Wash your hands with soap and water after contact with the patch.    Regional Anesthesia Blocks  1. Numbness or the inability to move the "blocked" extremity may last from 3-48 hours after placement. The length of time depends on the medication injected and your individual response to the medication. If the numbness is not going away after 48 hours, call your surgeon.  2. The extremity that is blocked will need to be protected until the numbness is gone and the  Strength has returned. Because you cannot feel it, you will need to take extra care to avoid injury. Because it may be weak, you may have difficulty moving it or using it. You may not know what position it is in without looking at it while the block is in effect.  3. For blocks in the legs and feet, returning to weight bearing and walking needs to be done carefully. You will need to wait until the numbness is entirely gone and the strength has returned. You should be able to move your leg   and foot normally before you try and bear weight or walk. You will need someone to be with you when you first try to ensure you do not fall and possibly risk injury.  4. Bruising and tenderness at the needle site are common side effects and will resolve in a few days.  5. Persistent numbness or new problems with movement should be communicated to the surgeon or the  Surgery Center ([phone])/  Surgery Center ([phone]).  Information for  Discharge Teaching: EXPAREL (bupivacaine liposome injectable suspension)   Your surgeon or anesthesiologist gave you EXPAREL(bupivacaine) to help control your pain after surgery.   EXPAREL is a local anesthetic that provides pain relief by numbing the tissue around the surgical site.  EXPAREL is designed to release pain medication over time and can control pain for up to 72 hours.  Depending on how you respond to EXPAREL, you may require less pain medication during your recovery.  Possible side effects:  Temporary loss of sensation or ability to move in the area where bupivacaine was injected.  Nausea, vomiting, constipation  Rarely, numbness and tingling in your mouth or lips, lightheadedness, or anxiety may occur.  Call your doctor right away if you think you may be experiencing any of these sensations, or if you have other questions regarding possible side effects.  Follow all other discharge instructions given to you by your surgeon or nurse. Eat a healthy diet and drink plenty of water or other fluids.  If you return to the hospital for any reason within 96 hours following the administration of EXPAREL, it is important for health care providers to know that you have received this anesthetic. A teal colored band has been placed on your arm with the date, time and amount of EXPAREL you have received in order to alert and inform your health care providers. Please leave this armband in place for the full 96 hours following administration, and then you may remove the band. 

## 2019-08-21 NOTE — Transfer of Care (Signed)
Immediate Anesthesia Transfer of Care Note  Patient: Dylan Bruce  Procedure(s) Performed: CARPAL TUNNEL RELEASE (Left Wrist) DECOMPRESSION WITH ULNAR NERVE LEFT CUBITAL TUNNEL ULNAR (Left Elbow) DECOMPRESSION ULNAR NERVE GUYON'S CANAL RELEASE (Left Arm Lower) RELEASE TRIGGER LEFT SMALL FINGER LEFT INDEX (Left Hand)  Patient Location: PACU  Anesthesia Type:MAC combined with regional for post-op pain  Level of Consciousness: awake, alert , oriented and drowsy  Airway & Oxygen Therapy: Patient Spontanous Breathing and Patient connected to face mask oxygen  Post-op Assessment: Report given to RN and Post -op Vital signs reviewed and stable  Post vital signs: Reviewed and stable  Last Vitals:  Vitals Value Taken Time  BP    Temp    Pulse 84 08/21/19 1007  Resp 9 08/21/19 1007  SpO2 96 % 08/21/19 1007  Vitals shown include unvalidated device data.  Last Pain:  Vitals:   08/21/19 0810  TempSrc: Oral  PainSc: 0-No pain      Patients Stated Pain Goal: 5 (71/06/26 9485)  Complications: No apparent anesthesia complications

## 2019-08-21 NOTE — Op Note (Signed)
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   ASSISTANT: Leanora Cover, MD   ANESTHESIA:  Regional with sedation   INTRAVENOUS FLUIDS:  Per anesthesia flow sheet.   ESTIMATED BLOOD LOSS:  Minimal.   COMPLICATIONS:  None.   SPECIMENS:  none   TOURNIQUET TIME:    Total Tourniquet Time Documented: Upper Arm (Left) - 50 minutes Total: Upper Arm (Left) - 50 minutes    DISPOSITION:  Stable to PACU.   INDICATIONS: Patient is a 73 year old male with history of numbness and tingling of his hands bilaterally.  He has triggering of his index and small fingers.  Nerve conductions are positive revealing changes in the ulnar nerve both elbow and wrist and median nerve at the wrist on his left arm.  This has failed conservative treatment he is like to undergo surgical decompression.  This includes the ulnar nerve at the elbow wrist and carpal tunnel median nerve A1 pulleys of the index and small fingers left hand.  Preperi-and postoperative course been discussed along with risks and complications.  He is aware that there is no guarantee to the surgery the possibility of infection recurrence injury to arteries nerves tendons complete relief symptoms and dystrophy.  He is aware that he has cervical spine problems and this may be contributing to double crush.  Preoperative area the patient is seen the extremity marked by both patient and surgeon  antibiotic given  OPERATIVE COURSE: Patient is brought to the operating room after a supraclavicular block was carried out without difficulty under the direction the anesthesia department in the preoperative area.  Was prepped using ChloraPrep and in supine position with the left arm free.  A 3-minute dry time was allowed and timeout taken to confirm patient procedure.  An oblique incision was made over the A1 pulley of the left index carried down through subcutaneous tissue.  Neurovascular structures were identified protected with retractors the A1 pulley was released on its radial aspect a small incision made centrally and A2 tenosynovial tissue proximally separated tendons separated breaking adhesions the finger placed through full passive range of motion no further triggering was noted.  The wound was irrigated and closed with 4-0 nylon sutures.  The small finger was attended to next again an oblique incision was made over the A1 pulley carried down through subcutaneous tissue retractors skin placed retracting neurovascular structures radially and ulnarly the A1 pulley was released on its radial aspect a small incision made centrally and A2 the tenosynovial tissue proximally was separated with blunt dissection the 2 tendons were separated breaking any adhesions.  The finger was placed through a full passive range of motion no further triggering was noted.  The wound was irrigated and closed interrupted 4-0 nylon sutures.  A separate incision was then made longitudinally in the left palm carried down through subcutaneous tissue.  Bleeders were electrocauterized with bipolar.  The palmar fascia was split revealing the ulnar nerve pulled  more centrally than normal.  This was followed through Guyon's canal superficially.  There is released in the entire superficial branch of the ulnar nerve.  The deep branch was explored distally no lesions were noted.  The median nerve + released after identification of the  small finger flexor tendon.  Retractors were placed retracting the median nerve radially ulnar nerve ulnarly.  The flexor retinaculum was then released on its ulnar border.  An angled axial retractor placed between skin and forearm fascia and the proximal flexor retinaculum distal forearm fascia was then released for approximately 3 cm proximal to the wrist crease.  The canal was explored.  No further lesions were noted.  The wound was copiously irrigated with saline.  The skin was closed erupted 4 nylon sutures.    A separate incision was then made over the medial epicondyle of the left elbow carried down through subcutaneous tissue.  Osborne's fascia was identified released on its posterior aspect.  The ulnar nerve was identified.  The flexor carpi ulnaris was then split on its superficial fascia after placement of the knee retractors done after dissecting this skin subcutaneous tissue from the ulnar  carpi ulnaris.  The muscle belly was split.  A Kamiah guide for carpal tunnel release was then placed between the nerve deep fascia and using ENT angled scissors the deep fascia was released for approximately 6 cm distal to t    he medial epicondyle.  Attention was then directed proximally.  The brachial fascia was then separated from the brachial subcutaneous tissue and skin.  The knee retractor was placed the [facility] guide placed between the ulnar nerve proximally and the brachial fascia this was then released proximally for similar distance as was done distally.  The elbow was flexed the nerve did not dislocate.  The wound was copiously irrigated with saline.  Osborne's fascia was then repaired to the posterior skin flap with interrupted 2-0 Vicryl sutures.  This was done after irrigation subcutaneous tissue was closed with interrupted 4-0 Vicryl.  The skin was closed interrupted 4 nylon sutures.  A sterile compressive dressing including the entire arm was applied.  Deflation of the tourniquet all fingers  immediately pink.  He was taken to the recovery room for observation in satisfactory condition.  He will be discharged home to return hand center of [facility] in 1week Percocet.   Cindee Salt, MD

## 2019-08-22 ENCOUNTER — Encounter (HOSPITAL_BASED_OUTPATIENT_CLINIC_OR_DEPARTMENT_OTHER): Payer: Self-pay | Admitting: Orthopedic Surgery

## 2020-06-10 IMAGING — MR MRI HEAD WITHOUT CONTRAST
11 of 13 series · 33 of 48 positions shown · non-contrast
Comparison: None.

CLINICAL DATA: Diplopia left-sided headache. Rule out posterior
communicating artery aneurysm. Rule out subarachnoid hemorrhage

EXAM:
MRI HEAD WITHOUT CONTRAST
MRA HEAD WITHOUT CONTRAST
TECHNIQUE: Multiplanar, multiecho pulse sequences of the brain and surrounding
structures were obtained without intravenous contrast. Angiographic
images of the head were obtained using MRA technique without
contrast.

[Series 5: DWI · axial · 3.0mm · 0.88mm/px · z∈[-50,+84]mm · 6 of 92 slices shown (1 of 4)]
[im 1/92]
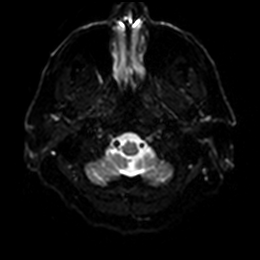
[im 19/92]
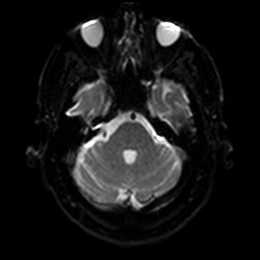
[im 37/92]
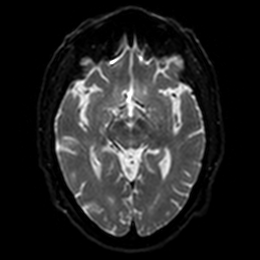
[im 55/92]
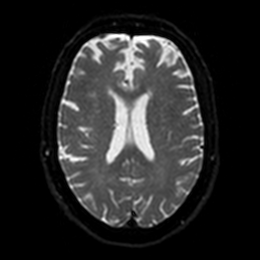
[im 73/92]
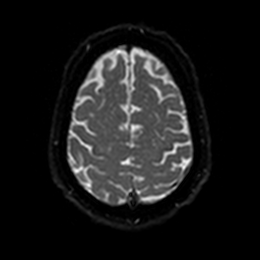
[im 92/92]
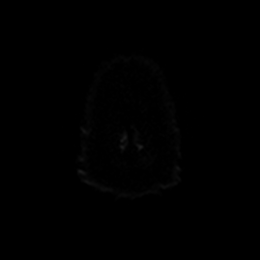

[Series 6: DWI · axial · 3.0mm · 0.88mm/px · z∈[-50,+84]mm · 3 of 46 slices shown (2 of 4)]
[im 1/46]
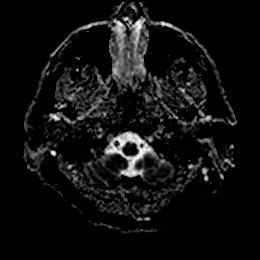
[im 23/46]
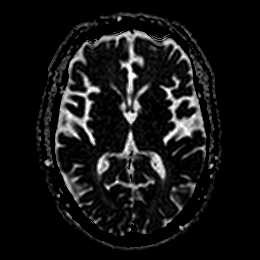
[im 46/46]
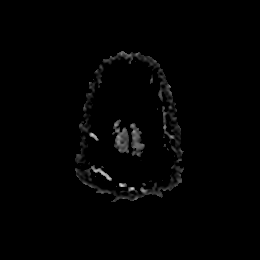

[Series 7: DWI · coronal · 4.0mm · 0.88mm/px · 4 of 70 slices shown (3 of 4)]
[im 1/70]
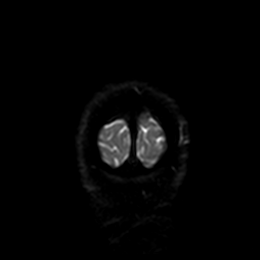
[im 24/70]
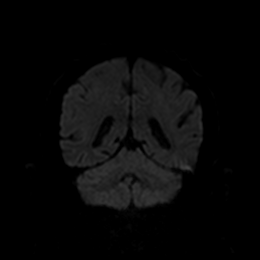
[im 47/70]
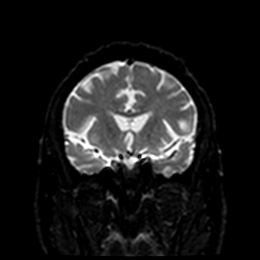
[im 70/70]
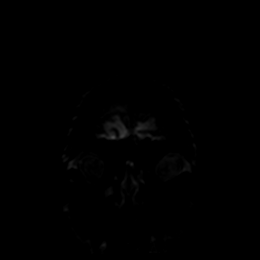

[Series 8: DWI · coronal · 4.0mm · 0.88mm/px · 2 of 35 slices shown (4 of 4)]
[im 1/35]
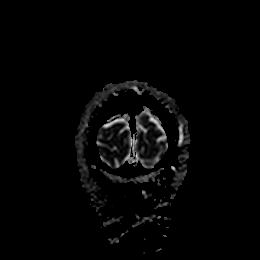
[im 35/35]
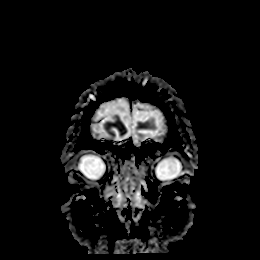

[Series 13: T1 · sagittal · 5.0mm · 0.75mm/px · 1 of 23 slices shown]
[im 1/23]
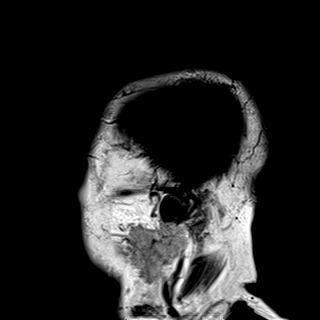

[Series 14: T2 · axial · 5.0mm · 0.72mm/px · z∈[-60,+83]mm · 2 of 25 slices shown]
[im 1/25]
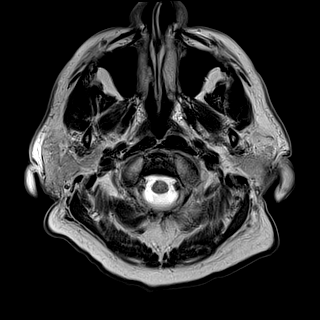
[im 25/25]
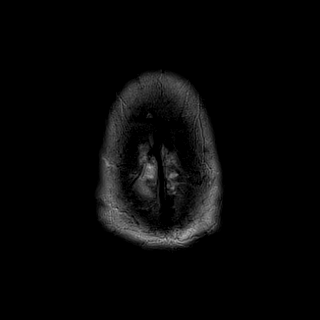

[Series 15: FLAIR · axial · 5.0mm · 0.45mm/px · z∈[-61,+82]mm · 2 of 25 slices shown]
[im 1/25]
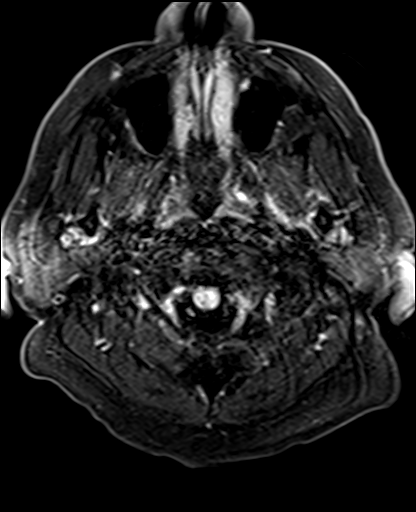
[im 25/25]
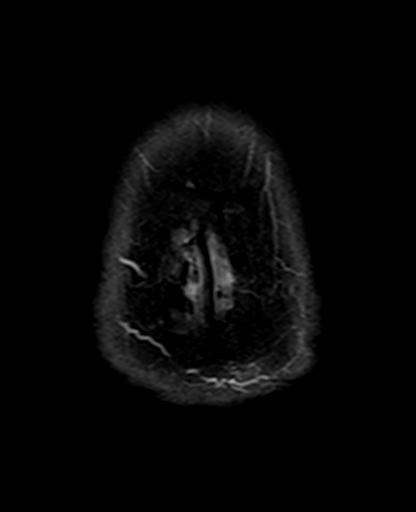

[Series 16: mag_images · axial · 3.0mm · 0.90mm/px · z∈[-72,+103]mm · 4 of 60 slices shown]
[im 1/60]
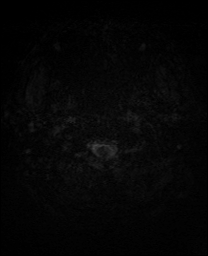
[im 20/60]
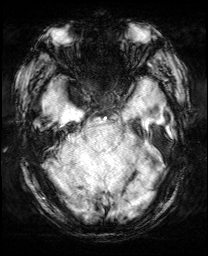
[im 40/60]
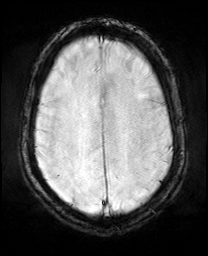
[im 60/60]
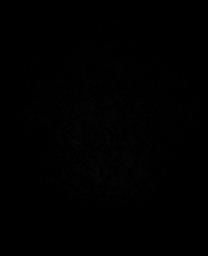

[Series 18: swi_images · axial · 3.0mm · 0.90mm/px · z∈[-72,+103]mm · 4 of 60 slices shown]
[im 1/60]
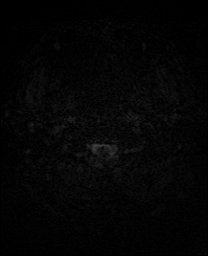
[im 20/60]
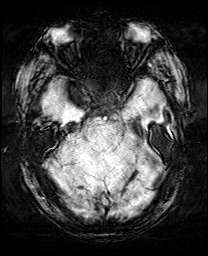
[im 40/60]
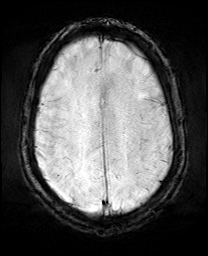
[im 60/60]
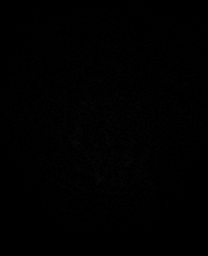

[Series 19: mip_images(sw) · axial · 24.0mm · 0.90mm/px · z∈[-62,+93]mm · 3 of 53 slices shown]
[im 1/53]
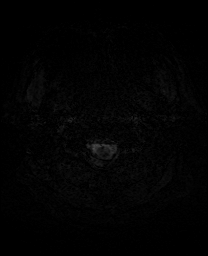
[im 27/53]
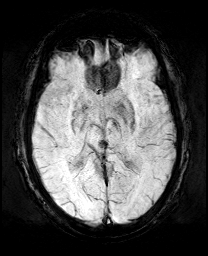
[im 53/53]
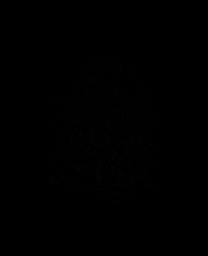

[Series 21: T2 post-contrast · coronal · 5.0mm · 0.72mm/px · 2 of 28 slices shown]
[im 1/28]
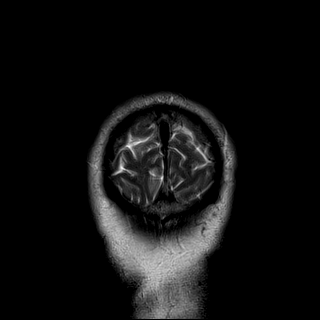
[im 28/28]
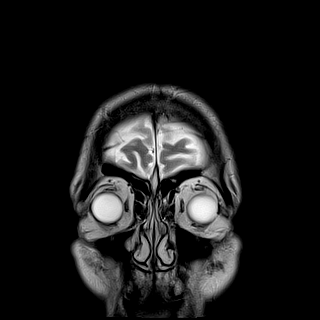

[33 of 48 positions shown; findings below may reference images not displayed]

FINDINGS: MRI HEAD FINDINGS

Brain: Ventricle size and cerebral volume normal for age. Negative
for acute infarct. Scattered small deep white matter
hyperintensities bilaterally compatible with mild chronic
microvascular ischemia. Negative for hemorrhage or mass. Negative
for fluid collection or midline shift.

Vascular: Normal arterial flow voids.

Skull and upper cervical spine: Negative

Sinuses/Orbits: Mucosal edema paranasal sinuses and left sphenoid
sinus. Negative orbit

Other: None

MRA HEAD FINDINGS

Both vertebral arteries patent to the basilar. PICA patent
bilaterally. Basilar widely patent. Superior cerebellar and
posterior cerebral arteries are patent. Patent posterior
communicating artery on the left but without aneurysm.

Internal carotid artery patent bilaterally without stenosis.
Anterior and middle cerebral arteries widely patent without stenosis

Negative for cerebral aneurysm.
IMPRESSION: 1. No acute intracranial abnormality. Mild chronic micro vascular
ischemia in the white matter.
2. Negative MRA head.  Negative for cerebral aneurysm.

## 2020-09-15 DIAGNOSIS — M1A0790 Idiopathic chronic gout, unspecified ankle and foot, without tophus (tophi): Secondary | ICD-10-CM | POA: Insufficient documentation

## 2023-01-07 ENCOUNTER — Other Ambulatory Visit: Payer: Self-pay | Admitting: Internal Medicine

## 2023-01-07 ENCOUNTER — Inpatient Hospital Stay (HOSPITAL_COMMUNITY)
Admission: RE | Admit: 2023-01-07 | Discharge: 2023-01-12 | DRG: 280 | Disposition: A | Discharge status: Home / Self Care | Payer: BC Managed Care – PPO | Source: Other Acute Inpatient Hospital | Attending: Internal Medicine | Admitting: Internal Medicine

## 2023-01-07 ENCOUNTER — Encounter (HOSPITAL_COMMUNITY): Payer: Self-pay

## 2023-01-07 ENCOUNTER — Other Ambulatory Visit: Payer: Self-pay

## 2023-01-07 ENCOUNTER — Encounter (HOSPITAL_COMMUNITY): Payer: Self-pay | Admitting: Internal Medicine

## 2023-01-07 DIAGNOSIS — E669 Obesity, unspecified: Secondary | ICD-10-CM | POA: Diagnosis present

## 2023-01-07 DIAGNOSIS — E1151 Type 2 diabetes mellitus with diabetic peripheral angiopathy without gangrene: Secondary | ICD-10-CM | POA: Diagnosis present

## 2023-01-07 DIAGNOSIS — M109 Gout, unspecified: Secondary | ICD-10-CM | POA: Diagnosis present

## 2023-01-07 DIAGNOSIS — I130 Hypertensive heart and chronic kidney disease with heart failure and stage 1 through stage 4 chronic kidney disease, or unspecified chronic kidney disease: Secondary | ICD-10-CM | POA: Diagnosis present

## 2023-01-07 DIAGNOSIS — I2582 Chronic total occlusion of coronary artery: Secondary | ICD-10-CM | POA: Diagnosis present

## 2023-01-07 DIAGNOSIS — E1149 Type 2 diabetes mellitus with other diabetic neurological complication: Secondary | ICD-10-CM

## 2023-01-07 DIAGNOSIS — R001 Bradycardia, unspecified: Secondary | ICD-10-CM | POA: Diagnosis present

## 2023-01-07 DIAGNOSIS — I70219 Atherosclerosis of native arteries of extremities with intermittent claudication, unspecified extremity: Secondary | ICD-10-CM | POA: Diagnosis present

## 2023-01-07 DIAGNOSIS — E1140 Type 2 diabetes mellitus with diabetic neuropathy, unspecified: Secondary | ICD-10-CM | POA: Diagnosis present

## 2023-01-07 DIAGNOSIS — I5033 Acute on chronic diastolic (congestive) heart failure: Secondary | ICD-10-CM | POA: Diagnosis present

## 2023-01-07 DIAGNOSIS — E785 Hyperlipidemia, unspecified: Secondary | ICD-10-CM | POA: Diagnosis present

## 2023-01-07 DIAGNOSIS — I5023 Acute on chronic systolic (congestive) heart failure: Secondary | ICD-10-CM | POA: Diagnosis not present

## 2023-01-07 DIAGNOSIS — L408 Other psoriasis: Secondary | ICD-10-CM | POA: Diagnosis present

## 2023-01-07 DIAGNOSIS — N183 Chronic kidney disease, stage 3 unspecified: Secondary | ICD-10-CM | POA: Diagnosis present

## 2023-01-07 DIAGNOSIS — Z886 Allergy status to analgesic agent status: Secondary | ICD-10-CM

## 2023-01-07 DIAGNOSIS — Z888 Allergy status to other drugs, medicaments and biological substances status: Secondary | ICD-10-CM

## 2023-01-07 DIAGNOSIS — R0602 Shortness of breath: Secondary | ICD-10-CM | POA: Diagnosis present

## 2023-01-07 DIAGNOSIS — D649 Anemia, unspecified: Secondary | ICD-10-CM | POA: Diagnosis not present

## 2023-01-07 DIAGNOSIS — Z881 Allergy status to other antibiotic agents status: Secondary | ICD-10-CM

## 2023-01-07 DIAGNOSIS — F419 Anxiety disorder, unspecified: Secondary | ICD-10-CM | POA: Diagnosis not present

## 2023-01-07 DIAGNOSIS — M549 Dorsalgia, unspecified: Secondary | ICD-10-CM

## 2023-01-07 DIAGNOSIS — E663 Overweight: Secondary | ICD-10-CM | POA: Diagnosis present

## 2023-01-07 DIAGNOSIS — E1165 Type 2 diabetes mellitus with hyperglycemia: Secondary | ICD-10-CM | POA: Diagnosis not present

## 2023-01-07 DIAGNOSIS — D72829 Elevated white blood cell count, unspecified: Secondary | ICD-10-CM | POA: Diagnosis present

## 2023-01-07 DIAGNOSIS — E1122 Type 2 diabetes mellitus with diabetic chronic kidney disease: Secondary | ICD-10-CM | POA: Diagnosis present

## 2023-01-07 DIAGNOSIS — K219 Gastro-esophageal reflux disease without esophagitis: Secondary | ICD-10-CM | POA: Diagnosis present

## 2023-01-07 DIAGNOSIS — G4730 Sleep apnea, unspecified: Secondary | ICD-10-CM | POA: Diagnosis not present

## 2023-01-07 DIAGNOSIS — D509 Iron deficiency anemia, unspecified: Secondary | ICD-10-CM | POA: Diagnosis present

## 2023-01-07 DIAGNOSIS — L409 Psoriasis, unspecified: Secondary | ICD-10-CM | POA: Diagnosis present

## 2023-01-07 DIAGNOSIS — E119 Type 2 diabetes mellitus without complications: Secondary | ICD-10-CM

## 2023-01-07 DIAGNOSIS — I350 Nonrheumatic aortic (valve) stenosis: Secondary | ICD-10-CM | POA: Diagnosis present

## 2023-01-07 DIAGNOSIS — I70211 Atherosclerosis of native arteries of extremities with intermittent claudication, right leg: Secondary | ICD-10-CM | POA: Diagnosis not present

## 2023-01-07 DIAGNOSIS — G8929 Other chronic pain: Secondary | ICD-10-CM | POA: Diagnosis present

## 2023-01-07 DIAGNOSIS — N179 Acute kidney failure, unspecified: Secondary | ICD-10-CM | POA: Diagnosis not present

## 2023-01-07 DIAGNOSIS — E1169 Type 2 diabetes mellitus with other specified complication: Secondary | ICD-10-CM | POA: Diagnosis present

## 2023-01-07 DIAGNOSIS — E871 Hypo-osmolality and hyponatremia: Secondary | ICD-10-CM | POA: Diagnosis not present

## 2023-01-07 DIAGNOSIS — F32A Depression, unspecified: Secondary | ICD-10-CM | POA: Diagnosis present

## 2023-01-07 DIAGNOSIS — N1832 Chronic kidney disease, stage 3b: Secondary | ICD-10-CM | POA: Diagnosis present

## 2023-01-07 DIAGNOSIS — Q059 Spina bifida, unspecified: Secondary | ICD-10-CM | POA: Diagnosis not present

## 2023-01-07 DIAGNOSIS — G4733 Obstructive sleep apnea (adult) (pediatric): Secondary | ICD-10-CM | POA: Diagnosis present

## 2023-01-07 DIAGNOSIS — R7989 Other specified abnormal findings of blood chemistry: Secondary | ICD-10-CM

## 2023-01-07 DIAGNOSIS — Z794 Long term (current) use of insulin: Secondary | ICD-10-CM

## 2023-01-07 DIAGNOSIS — I739 Peripheral vascular disease, unspecified: Secondary | ICD-10-CM | POA: Diagnosis present

## 2023-01-07 DIAGNOSIS — I214 Non-ST elevation (NSTEMI) myocardial infarction: Principal | ICD-10-CM | POA: Diagnosis present

## 2023-01-07 DIAGNOSIS — Z8261 Family history of arthritis: Secondary | ICD-10-CM

## 2023-01-07 DIAGNOSIS — L4050 Arthropathic psoriasis, unspecified: Secondary | ICD-10-CM | POA: Diagnosis present

## 2023-01-07 DIAGNOSIS — I2510 Atherosclerotic heart disease of native coronary artery without angina pectoris: Secondary | ICD-10-CM

## 2023-01-07 DIAGNOSIS — I4891 Unspecified atrial fibrillation: Secondary | ICD-10-CM | POA: Diagnosis present

## 2023-01-07 DIAGNOSIS — Z955 Presence of coronary angioplasty implant and graft: Secondary | ICD-10-CM

## 2023-01-07 DIAGNOSIS — I25810 Atherosclerosis of coronary artery bypass graft(s) without angina pectoris: Secondary | ICD-10-CM | POA: Diagnosis present

## 2023-01-07 DIAGNOSIS — J45909 Unspecified asthma, uncomplicated: Secondary | ICD-10-CM | POA: Diagnosis present

## 2023-01-07 DIAGNOSIS — I5021 Acute systolic (congestive) heart failure: Secondary | ICD-10-CM | POA: Diagnosis not present

## 2023-01-07 DIAGNOSIS — I441 Atrioventricular block, second degree: Secondary | ICD-10-CM | POA: Diagnosis present

## 2023-01-07 DIAGNOSIS — Z7902 Long term (current) use of antithrombotics/antiplatelets: Secondary | ICD-10-CM

## 2023-01-07 DIAGNOSIS — N1831 Chronic kidney disease, stage 3a: Secondary | ICD-10-CM

## 2023-01-07 DIAGNOSIS — I10 Essential (primary) hypertension: Secondary | ICD-10-CM | POA: Diagnosis present

## 2023-01-07 DIAGNOSIS — Z7982 Long term (current) use of aspirin: Secondary | ICD-10-CM

## 2023-01-07 DIAGNOSIS — Z6830 Body mass index (BMI) 30.0-30.9, adult: Secondary | ICD-10-CM

## 2023-01-07 DIAGNOSIS — Z882 Allergy status to sulfonamides status: Secondary | ICD-10-CM

## 2023-01-07 DIAGNOSIS — Z79899 Other long term (current) drug therapy: Secondary | ICD-10-CM

## 2023-01-07 LAB — MAGNESIUM: Magnesium: 2.1 mg/dL (ref 1.7–2.4)

## 2023-01-07 LAB — GLUCOSE, CAPILLARY
Glucose-Capillary: 249 mg/dL — ABNORMAL HIGH (ref 70–99)
Glucose-Capillary: 265 mg/dL — ABNORMAL HIGH (ref 70–99)

## 2023-01-07 LAB — TROPONIN I (HIGH SENSITIVITY): Troponin I (High Sensitivity): 11393 ng/L (ref <18)

## 2023-01-07 MED ORDER — POLYETHYLENE GLYCOL 3350 17 G PO PACK
17.0000 g | PACK | Freq: Every day | ORAL | Status: DC | PRN
  Administered 2023-01-08 – 2023-01-09 (×2): 17 g via ORAL
  Filled 2023-01-07 (×2): qty 1

## 2023-01-07 MED ORDER — INSULIN ASPART 100 UNIT/ML IJ SOLN
0.0000 [IU] | Freq: Three times a day (TID) | INTRAMUSCULAR | Status: DC
  Administered 2023-01-08: 3 [IU] via SUBCUTANEOUS
  Administered 2023-01-08: 5 [IU] via SUBCUTANEOUS
  Administered 2023-01-09: 3 [IU] via SUBCUTANEOUS
  Administered 2023-01-09: 8 [IU] via SUBCUTANEOUS
  Administered 2023-01-10 (×2): 3 [IU] via SUBCUTANEOUS
  Administered 2023-01-10: 5 [IU] via SUBCUTANEOUS
  Administered 2023-01-11: 3 [IU] via SUBCUTANEOUS
  Administered 2023-01-11: 8 [IU] via SUBCUTANEOUS
  Administered 2023-01-12: 3 [IU] via SUBCUTANEOUS

## 2023-01-07 MED ORDER — VILAZODONE HCL 20 MG PO TABS
40.0000 mg | ORAL_TABLET | Freq: Every day | ORAL | Status: DC
  Administered 2023-01-08 – 2023-01-12 (×4): 40 mg via ORAL
  Filled 2023-01-07 (×5): qty 2

## 2023-01-07 MED ORDER — OXYCODONE HCL 5 MG PO TABS
5.0000 mg | ORAL_TABLET | Freq: Three times a day (TID) | ORAL | Status: DC | PRN
  Administered 2023-01-07 – 2023-01-08 (×3): 5 mg via ORAL
  Filled 2023-01-07 (×3): qty 1

## 2023-01-07 MED ORDER — METOPROLOL TARTRATE 50 MG PO TABS
50.0000 mg | ORAL_TABLET | Freq: Two times a day (BID) | ORAL | Status: DC
  Administered 2023-01-07: 25 mg via ORAL
  Filled 2023-01-07: qty 1

## 2023-01-07 MED ORDER — ATORVASTATIN CALCIUM 80 MG PO TABS
80.0000 mg | ORAL_TABLET | Freq: Every day | ORAL | Status: DC
  Administered 2023-01-08 – 2023-01-12 (×5): 80 mg via ORAL
  Filled 2023-01-07 (×5): qty 1

## 2023-01-07 MED ORDER — ISOSORBIDE MONONITRATE ER 30 MG PO TB24
30.0000 mg | ORAL_TABLET | Freq: Every day | ORAL | Status: DC
  Administered 2023-01-08 – 2023-01-12 (×5): 30 mg via ORAL
  Filled 2023-01-07 (×5): qty 1

## 2023-01-07 MED ORDER — OXYCODONE-ACETAMINOPHEN 5-325 MG PO TABS
1.0000 | ORAL_TABLET | Freq: Three times a day (TID) | ORAL | Status: DC | PRN
  Administered 2023-01-07 – 2023-01-08 (×2): 1 via ORAL
  Filled 2023-01-07 (×2): qty 1

## 2023-01-07 MED ORDER — INSULIN ASPART 100 UNIT/ML IJ SOLN
10.0000 [IU] | Freq: Three times a day (TID) | INTRAMUSCULAR | Status: DC
  Administered 2023-01-08: 10 [IU] via SUBCUTANEOUS

## 2023-01-07 MED ORDER — CLOPIDOGREL BISULFATE 75 MG PO TABS
75.0000 mg | ORAL_TABLET | Freq: Every day | ORAL | Status: DC
  Administered 2023-01-08 – 2023-01-12 (×5): 75 mg via ORAL
  Filled 2023-01-07 (×5): qty 1

## 2023-01-07 MED ORDER — TIZANIDINE HCL 2 MG PO TABS
2.0000 mg | ORAL_TABLET | Freq: Three times a day (TID) | ORAL | Status: DC | PRN
  Administered 2023-01-07 – 2023-01-08 (×2): 2 mg via ORAL
  Filled 2023-01-07 (×3): qty 1

## 2023-01-07 MED ORDER — MIRTAZAPINE 30 MG PO TABS
30.0000 mg | ORAL_TABLET | Freq: Every day | ORAL | Status: DC
  Administered 2023-01-07 – 2023-01-11 (×5): 30 mg via ORAL
  Filled 2023-01-07 (×5): qty 1

## 2023-01-07 MED ORDER — PANTOPRAZOLE SODIUM 40 MG PO TBEC
40.0000 mg | DELAYED_RELEASE_TABLET | Freq: Every day | ORAL | Status: DC
  Administered 2023-01-08 – 2023-01-12 (×5): 40 mg via ORAL
  Filled 2023-01-07 (×5): qty 1

## 2023-01-07 MED ORDER — SODIUM CHLORIDE 0.9% FLUSH
3.0000 mL | Freq: Two times a day (BID) | INTRAVENOUS | Status: DC
  Administered 2023-01-08 – 2023-01-11 (×8): 3 mL via INTRAVENOUS

## 2023-01-07 MED ORDER — ASPIRIN 81 MG PO TBEC
81.0000 mg | DELAYED_RELEASE_TABLET | Freq: Every day | ORAL | Status: DC
  Administered 2023-01-08 – 2023-01-12 (×4): 81 mg via ORAL
  Filled 2023-01-07 (×4): qty 1

## 2023-01-07 MED ORDER — ACETAMINOPHEN 650 MG RE SUPP: 650.0000 mg | Freq: Four times a day (QID) | RECTAL | Status: DC | PRN

## 2023-01-07 MED ORDER — HEPARIN (PORCINE) 25000 UT/250ML-% IV SOLN
1400.0000 [IU]/h | INTRAVENOUS | Status: DC
  Administered 2023-01-08: 1400 [IU]/h via INTRAVENOUS
  Filled 2023-01-07: qty 250

## 2023-01-07 MED ORDER — METOPROLOL TARTRATE 25 MG PO TABS
25.0000 mg | ORAL_TABLET | Freq: Two times a day (BID) | ORAL | Status: DC
  Administered 2023-01-08 – 2023-01-09 (×3): 25 mg via ORAL
  Filled 2023-01-07 (×3): qty 1

## 2023-01-07 MED ORDER — INSULIN ASPART 100 UNIT/ML IJ SOLN
0.0000 [IU] | Freq: Every day | INTRAMUSCULAR | Status: DC
  Administered 2023-01-07 – 2023-01-10 (×2): 3 [IU] via SUBCUTANEOUS

## 2023-01-07 MED ORDER — INSULIN GLARGINE-YFGN 100 UNIT/ML ~~LOC~~ SOLN
20.0000 [IU] | Freq: Every day | SUBCUTANEOUS | Status: DC
  Administered 2023-01-07 – 2023-01-09 (×3): 20 [IU] via SUBCUTANEOUS
  Filled 2023-01-07 (×4): qty 0.2

## 2023-01-07 MED ORDER — PREGABALIN 100 MG PO CAPS
100.0000 mg | ORAL_CAPSULE | Freq: Two times a day (BID) | ORAL | Status: DC
  Administered 2023-01-07 – 2023-01-12 (×10): 100 mg via ORAL
  Filled 2023-01-07 (×10): qty 1

## 2023-01-07 MED ORDER — EZETIMIBE 10 MG PO TABS
10.0000 mg | ORAL_TABLET | Freq: Every day | ORAL | Status: DC
  Administered 2023-01-08 – 2023-01-12 (×5): 10 mg via ORAL
  Filled 2023-01-07 (×5): qty 1

## 2023-01-07 MED ORDER — ACETAMINOPHEN 325 MG PO TABS
650.0000 mg | ORAL_TABLET | Freq: Four times a day (QID) | ORAL | Status: DC | PRN
  Administered 2023-01-11: 650 mg via ORAL
  Filled 2023-01-07: qty 2

## 2023-01-07 MED ORDER — IPRATROPIUM-ALBUTEROL 0.5-2.5 (3) MG/3ML IN SOLN
3.0000 mL | Freq: Once | RESPIRATORY_TRACT | Status: AC
  Administered 2023-01-07: 3 mL via RESPIRATORY_TRACT

## 2023-01-07 MED ORDER — OXYCODONE-ACETAMINOPHEN 10-325 MG PO TABS: 1.0000 | ORAL_TABLET | Freq: Three times a day (TID) | ORAL | Status: DC | PRN

## 2023-01-07 NOTE — Consult Note (Incomplete)
Cardiology Consult    Patient ID: Dylan Bruce MRN: [id number], DOB/AGE: Dec 02, 1945   Admit date: 01/07/2023 Date of Consult: 01/07/2023 Requesting Provider: Neva Seat, MD  PCP:  [name], MD   [facility] Providers Cardiologist: Prior patient of Dr. Stanford Breed, now followed by [facility]  Patient Profile    Dylan Bruce is a 77 y.o. male with a history of CAD with 4v CABG 2003 and PCI of SVG-PDA (SVG-Diag and SVG-rPDA now occluded), moderate aortic stenosis, HFpEF, HTN, PAD, type 2 DM, psoriatic arthritis, OSA on CPAP, and CKD stage III. He is being seen today (01/07/2023) for the evaluation of   History of Present Illness   He was previously followed by Dr. Stanford Breed for CAD following CABG in 2003 and subsequent PCI of the SVG-PDA in 2010. SVG-D1 was occluded at that time, but other grafts to OMs and LAD patent. Myoview in 2013 had shown lateral infarct with peri-infarct ischemia. He was last seen by Dr. Stanford Breed in 2016. He established care with [facility] initially for cardiac clearance prior to shoulder surgery in 2018 and has been managed there since for his CV care. He has followed closely with Dr. [name] of [facility] family medicine for many years and receives all other specialty care in the [facility] system. He was subsequently seen in the valve clinic there in 2021 for worsening dyspnea and some progression of moderate AS (Vmax 3.2, MG 21 mmHg, DVI 0.32, AVA 1.9 cm2). Coronary and graft anatomy had not significantly changed from prior cath in 2019 that showed both the RCA and diagonal grafts to be occluded. L/RHC showed a mean AV Gradient of 32 mmHg, AVA of 1.2 cm2, mild post-capillary PH, and preserved output. Coronary disease was felt to be stable and AS moderate, so continued medical management was recommended at that time. EF has remained preserved.   He presented to [facility] earlier today with several days of worsening dyspnea, orthopnea, and lower extremity  edema. No chest pain, though he never has had chest pains despite having multiple occluded coronaries.  He admitted to not taking any of his medications for the last few days. He was started on Augmentin yesterday by his PCP for a possible sinus infection.   OSH lab workup showed conventional troponin of 15, proBNP 13,500, negative respiratory panel, stable creatinine of 1.6, glucose 351, T bili 1.4. Hgb 10.9. ECG unavailable but reportedly showed SR with known first degree AVB and only nonspecific ST-T wave abnormalities. Chest x-ray with pulmonary edema and pleural effusion. HR 90s and normotensive, oxygenating adequately on room air. He was given Lasix '80mg'$  and started on insulin and heparin infusions. Also loaded with aspirin and Plavix, then transferred to [facility] for further management (per his request). Initial troponin on arrival here is 11,393.  Wife had stroke last year and treated at [facility] and recov3ered well - wants to transfer back here. He goes a few weeks of sleeping well then will have a couple weeks of having what sounds like orthopnea. Thinks som eof this is mental. Baselin4e weight is 232, but hasn't been good recently about checking..  If he takes nitro helsp with orthopnea for the evening. Also if he sleeps in his recliner for a couple hours then gets back in bed Sinus trouble last week and developed fever of 101 and flu-like symptoms by Friday. Saturday legs were huge, couldn't eat, worse feeling bad, realized he hadn't taken his pills in 4 days. Dyspnea just walking aroudn  house. Usually walks 1/4 mile. No chest pain. Takes lasix 80 in am then additional lasix 40-80 in PM - bases it on convenience/schedule.   Past Medical History   Past Medical History:  Diagnosis Date  . ARTHRITIS   . ASTHMA   . CAD (coronary artery disease)   . CHF (congestive heart failure) ([place])   . DM   . 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 GERD   . HYPERLIPIDEMIA   . Hypertension   .  PSORIASIS   . SLEEP APNEA   . SPINA BIFIDA     Past Surgical History:  Procedure Laterality Date  . BACK SURGERY    . CARPAL TUNNEL RELEASE Left 08/21/2019   Procedure: CARPAL TUNNEL RELEASE;  Surgeon: Daryll Brod, MD;  Location: [place];  Service: Orthopedics;  Laterality: Left;  AXILLARY BLOCK  . CORONARY ARTERY BYPASS GRAFT    . JOINT REPLACEMENT Left    shoulder  . LEFT HEART CATH AND CORS/GRAFTS ANGIOGRAPHY N/A 06/18/2018   Procedure: LEFT HEART CATH AND CORS/GRAFTS ANGIOGRAPHY;  Surgeon: Lorretta Harp, MD;  Location: [place] [facility];  Service: Cardiovascular;  Laterality: N/A;  . TRIGGER FINGER RELEASE Left 08/21/2019   Procedure: RELEASE TRIGGER LEFT SMALL FINGER LEFT INDEX;  Surgeon: Daryll Brod, MD;  Location: [place];  Service: Orthopedics;  Laterality: Left;  . ULNAR NERVE TRANSPOSITION Left 08/21/2019   Procedure: DECOMPRESSION WITH ULNAR NERVE LEFT CUBITAL TUNNEL ULNAR;  Surgeon: Daryll Brod, MD;  Location: [place];  Service: Orthopedics;  Laterality: Left;     Allergies  Allergen Reactions  . Cefuroxime Diarrhea and Nausea And Vomiting    Severe  . Ibuprofen Anaphylaxis and Shortness Of Breath       . Cefpodoxime Rash  . Ciprofloxacin Rash  . Doxycycline Rash  . Sulfamethoxazole-Trimethoprim Rash  . Fluocinolone Other (See Comments)    unknown  . Probenecid Other (See Comments)    unknown  . Allopurinol Rash  . Azithromycin Rash  . Sulfa Antibiotics Rash   Inpatient Medications    . [START ON 01/08/2023] aspirin EC  81 mg Oral Daily  . [START ON 01/08/2023] pantoprazole  40 mg Oral Daily  . sodium chloride flush  3 mL Intravenous Q12H    Family History     Daughter developed rheumatoid arthritis at age 18. Negative for diabetes, kidney disease or coronary artery disease. Mother at age 91 and father at age 22 both "died of old age".    Social History    Socioeconomic History  . Marital status:  Married  . Number of children: 2  Occupational History  . 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 Smoking status: Never  . Smokeless tobacco: Never  Substance and Sexual Activity  . Alcohol use: Yes, but no heavy use  . Drug use: Previous marijuana use for 40 years   Review of Systems    A comprehensive review of systems was performed with pertinent positives and negatives noted in the HPI.  Physical Exam    Blood pressure 113/83, pulse 91, temperature 98.6 F (37 C), temperature source Oral, resp. rate 16, height '6\' 2"'$  (1.88 m), weight 103.1 kg.     Intake/Output Summary (Last 24 hours) at 01/07/2023 1809 Last data filed at 01/07/2023 1800 Gross per 24 hour  Intake 0.4 ml  Output --  Net 0.4 ml   Wt Readings from  Last 3 Encounters:  01/07/23 103.1 kg  08/21/19 112.5 kg  09/03/18 105.2 kg    CONSTITUTIONAL: alert and conversant, well-appearing, nourished, no distress HEENT: normal NECK: no JVD, no masses CARDIAC: Regular rhythm. Normal S1/S2, no S3/S4. No murmur. No friction rub. JVP ***  VASCULAR: Radial pulses intact bilaterally. No carotid bruits. PULMONARY/CHEST WALL: no deformities, normal breath sounds bilaterally, normal work of breathing ABDOMINAL: soft, non-tender, non-distended EXTREMITIES: no edema, no muscle atrophy, warm and well-perfused SKIN: Dry and intact without apparent rashes or wounds. No peripheral cyanosis. NEUROLOGIC: alert, no abnormal movements, cranial nerves grossly intact. PSYCH: normal affect, normal speech and language   Labs    OSH labs reviewed with pertinent findings noted in HPI.   Radiology Studies    No results found.  ECG & Cardiac Imaging    Coronary/graft angio, RLHC (06/2020, [facility]): RA mean: 10 mmHg RV: 35/11 mmHg PA (mean): 35/22 (25) mmHg PCWP mean: 16 mmHg PA Sat: 65 % Art Sat: 96 % Fick Cardiac output: 6.2 L/min Fick Cardiac index: 2.6 L/min/m2  Left Heart  Catheterization Left LVEDP: 18 mmHg  Invasive Aortic Valve Hemodynamic Study: Mean AV Gradient: 32 mmHg Aortic Valve Area: 1.2 cm2  Coronary Angiography  Aortic Pressure: 131/75 (97) mmHg  Coronary arteries: Dominance: Right Left Main: 20% mid shaft stenosis Left Anterior Descending: Large vessel with a 50% proximal stenosis. 100% occluded after a large D1. The ostium of the D1 has a focal 90% stenosis.  Left Circumflex: Large vessel. There is a moderate-sized ramus. OM1 and OM2 are also moderate in size. Diffuse mild CAD.  Right Coronary: 100% occluded in the mid segment.   Bypass Graft Angiography: LIMA to the LAD: Widely patent SVG to OM: Widely patent SVG to Diag: 100% occluded SVG to rPDA: 100 % occluded   TTE (05/2020, [facility]): Severe LVH, EF > 55%, grade 2 DD  RV normal Mild LA dilation Mild MR Moderate AS   Peak AV transvalvular velocity:  3.2 m/s.    Mean gradient: 21 mmHg.    Doppler velocity index: 0.32.    Estimated aortic valve area (VTI): 1.9 cm2.  Normal right atrial pressure  There is no pericardial effusion   Lower extremity arterial duplex (2019, [facility]): Right Lower Extremity   ABI: 0.75   Inflow: Normal common femoral arterial waveforms and velocities. No  evidence of inflow (aortoiliac) disease.   Outflow: No focal elevation of the peak systolic velocity in the  superficial femoral artery to suggest focal stenosis. However, the  waveforms transition from triphasic to monophasic at the distal  thigh. Monophasic flow throughout the popliteal artery. Focal  elevation of the peak systolic velocity in the distal aspect again  noted consistent with a hemodynamically significant stenosis.   Runoff: Significantly increased peak systolic velocity in the  proximal anterior tibial artery consistent with runoff disease.  Monophasic flow throughout the runoff arteries.   Left Lower Extremity   ABI: 1.0   Inflow: Calcified atherosclerotic plaque in the  common femoral  bifurcation extends into the deep femoral artery. There is  significant elevation of the peak systolic velocity in the proximal  deep femoral artery consistent with stenosis. No evidence of SFA or  popliteal stenosis.   Outflow: Normal profunda femoral, superficial femoral and popliteal  arterial waveforms and velocities. No focal elevation of the PSV to  suggest stenosis.   Runoff: Elevated peak systolic velocity in the proximal anterior  tibial artery consistent with runoff disease.   IMPRESSION:  1.  Abnormal resting right ankle-brachial index of 0.75 consistent  with at least moderate peripheral arterial disease.  2. Normal resting left ankle-brachial index of 1.0.  3. Imaging findings suggest hemodynamically significant stenosis in  the distal right popliteal and proximal right anterior tibial  arteries. Imaging findings suggest hemodynamically significant  stenosis in the proximal right profunda femoral artery and proximal  right anterior tibial artery.   Lexiscan MPI (05/2017, [facility]): Nuclear Perfusion Findings:  There is a large in size, severe, fixed defect involving the basal  inferolateral, basal anterolateral, mid inferolateral, mid anterolateral  and apical lateral segments. This is consistent with scar.   Nuclear Wall Motion Findings:  Post stress: Global systolic function is low normal. The anterolateral and  inferolateral walls are akinetic. The ejection fraction calculated at 52%.    ECG: pending. Prior ECGs show long first degree AVB with T-P fusion at higher rates that likely accounts for the reported "long" perceived QT intervals.   Assessment & Plan    Acute on chronic HFpEF, NYHA class CAD with NSTEMI and history of 4v CABG (2 grafts occluded) Moderate aortic stenosis, stage B (as of 2021) Peripheral arterial disease with claudication CKD stage III, stable (baseline GFR 42-45) Iron deficiency anemia (recent ferritin < 20) Type 2 DM, A1c  8.4 OSA, on CPAP  He was having significant heart failure issues with what was no worse than moderate AS, so I don't think this necessarily represents valvular disease progression. I am unable to see any of the angiograms, but by report, he has at least a large D1 and distal RCA territory that are un-revascularized since his vein grafts are now occluded. 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 He is on chronic high-dose opiates as well. I do think he warrants re-evaluation of both his AS and coronary anatomy at this point, but first needs diuresis.  - Lasix IV at least twice daily titrated for daily goal of 3L negative  - Monitor daily standing weights, strict I&Os, BMP/Mg at least daily - Continue DAPT, heparin infusion, atorvastatin, Zetia - Agree with continuing Imdur - Would also continue lisinopril, renal function at baseline - Decrease metoprolol to '25mg'$  BID given recent non-compliance, long first degree AVB, and elevated HR which may be compensatory in the setting of decompensated heart failure.   - Needs updated TTE and lipids/A1c - Left/right heart cath once more euvolemic - Please ensure CPAP while sleeping  Signed, Marykay Lex, MD 01/07/2023, 6:09 PM  For questions or updates, please contact   Please consult [email or web address] for contact info under Cardiology/STEMI.

## 2023-01-07 NOTE — H&P (Signed)
History and Physical   OR [name] [id number] DOB: 11-15-1945 DOA: 01/07/2023  PCP: Dylan Bott, MD   Patient coming from: [facility]  Chief Complaint: Shortness of breath  HPI: Dylan Bruce is a 77 y.o. male with medical history significant of CAD status post CABG and stenting, CHF, bradycardia, hypertension, hyperlipidemia, asthma, diabetes, sleep apnea, anxiety, depression, gout, neuropathy, chronic pain, PAD, CKD 3, GERD presenting with shortness of breath.  Patient reports 2-4 days of shortness of breath in the setting of not taking home medications for the past 3 to 4 days. States that he had some abnormal sinus sensation and some "fevers " (none measured at outside ED) and saw his PCP who started him on Augmentin yesterday. States this could have thrown him off his routine, but otherwise cannot explain why he stopped taking his medications for the past several days.  Family states he may have done some similar once before but not for as long period.  Does additionally report edema which is worsening as well as orthopnea.  He denies chills, chest pain, abdominal pain, constipation, diarrhea, nausea, vomiting.  ED Course: Vital signs at outside ED stable.  Lab workup included CMP with BUN 44, creatinine stable at 1.6, glucose 351, AST 60, T. bili 1.4.  CBC with leukocytosis of 12.4, hemoglobin stable at 10.9.  PT mildly elevated at 14.3 and INR and PTT normal.  proBNP elevated to 13,500, not high-sensitivity troponin elevated to 15.  Respiratory panel negative.  Chest x-ray with pulmonary edema and pleural effusions.  Patient received insulin, Lasix 80 mg IV and started on heparin drip in the ED.  ED provider at [facility] consulted our cardiology service who accepted patient to be seen in consultation based on patient's location preference.  Tried to admit.  Review of Systems: As per HPI otherwise all other systems reviewed and are negative.  Past Medical History:   Diagnosis Date   ARTHRITIS    ASTHMA    CAD (coronary artery disease)    CHF (congestive heart failure) ([place])    DM    Essential hypertension, malignant 07/17/2012   GERD    HYPERLIPIDEMIA    Hypertension    PSORIASIS    SLEEP APNEA    SPINA BIFIDA     Past Surgical History:  Procedure Laterality Date   BACK SURGERY     CARPAL TUNNEL RELEASE Left 08/21/2019   Procedure: CARPAL TUNNEL RELEASE;  Surgeon: Daryll Brod, MD;  Location: [place];  Service: Orthopedics;  Laterality: Left;  AXILLARY BLOCK   CORONARY ARTERY BYPASS GRAFT     JOINT REPLACEMENT Left    shoulder   LEFT HEART CATH AND CORS/GRAFTS ANGIOGRAPHY N/A 06/18/2018   Procedure: LEFT HEART CATH AND CORS/GRAFTS ANGIOGRAPHY;  Surgeon: Lorretta Harp, MD;  Location: [place] [facility];  Service: Cardiovascular;  Laterality: N/A;   TRIGGER FINGER RELEASE Left 08/21/2019   Procedure: RELEASE TRIGGER LEFT SMALL FINGER LEFT INDEX;  Surgeon: Daryll Brod, MD;  Location: [place];  Service: Orthopedics;  Laterality: Left;   ULNAR NERVE TRANSPOSITION Left 08/21/2019   Procedure: DECOMPRESSION WITH ULNAR NERVE LEFT CUBITAL TUNNEL ULNAR;  Surgeon: Daryll Brod, MD;  Location: [place];  Service: Orthopedics;  Laterality: Left;    Social History  reports that he has never smoked. He has never used smokeless tobacco. He reports current alcohol use. He reports that he does not use drugs.  Allergies  Allergen Reactions   Cefuroxime  Diarrhea and Nausea And Vomiting    Severe   Ibuprofen Anaphylaxis and Shortness Of Breath        Cefpodoxime Rash   Ciprofloxacin Rash   Doxycycline Rash   Sulfamethoxazole-Trimethoprim Rash   Fluocinolone Other (See Comments)    unknown   Probenecid Other (See Comments)    unknown   Allopurinol Rash   Azithromycin Rash   Sulfa Antibiotics Rash    History reviewed. No pertinent family history.  Prior to Admission medications    Medication Sig Start Date End Date Taking? Authorizing Provider  albuterol (PROVENTIL HFA;VENTOLIN HFA) 108 (90 Base) MCG/ACT inhaler Inhale 2 puffs into the lungs every 4 (four) hours as needed for wheezing. 04/16/17   [provider]  amLODipine (NORVASC) 10 MG tablet Take 1 tablet (10 mg total) by mouth daily. 06/19/18   Kroeger, Lorelee Cover., PA-C  aspirin EC 81 MG EC tablet Take 1 tablet (81 mg total) by mouth daily. 06/19/18   Kroeger, Lorelee Cover., PA-C  atorvastatin (LIPITOR) 80 MG tablet Take 1 tablet (80 mg total) by mouth daily. 06/18/18 08/13/19  Kroeger, Lorelee Cover., PA-C  BYDUREON 2 MG SUSR Inject 2 mg into the skin every Sunday.  08/15/13   [provider]  clobetasol cream (TEMOVATE) [id number] % Apply 1 application topically daily as needed (rash).  08/29/13   [provider]  clopidogrel (PLAVIX) 75 MG tablet Take 1 tablet (75 mg total) by mouth daily. 06/18/18   Kroeger, Daleen Snook M., PA-C  COSENTYX SENSOREADY, 300 MG, 150 MG/ML SOAJ Inject 300 mg into the skin every 30 (thirty) days. 04/26/18   [provider]  cyclobenzaprine (FLEXERIL) 10 MG tablet Take 10 mg by mouth at bedtime.  01/11/15   [provider]  desonide (DESOWEN) 0.05 % cream Apply 1 application topically daily as needed (rash).     [provider]  docusate sodium (COLACE) 100 MG capsule Take 100 mg by mouth daily.     [provider]  esomeprazole (NEXIUM) 40 MG capsule Take 40 mg by mouth daily before breakfast.    [provider]  ezetimibe (ZETIA) 10 MG tablet Take 10 mg by mouth daily. 07/13/17   [provider]  FINACEA 15 % cream Apply 1 application topically 2 (two) times daily as needed (rash).  11/17/14   [provider]  furosemide (LASIX) 40 MG tablet Take 40-80 mg by mouth See admin instructions. Take '80mg'$  by mouth each morning, then take '40mg'$  in the afternoon every other day.    [provider]  glyBURIDE (DIABETA) 5 MG tablet  Take 10 mg by mouth daily with breakfast.    [provider]  hydrocortisone (ANUCORT-HC) 25 MG suppository Place 1 suppository rectally 2 (two) times daily as needed for hemorrhoids. 01/12/17   [provider]  insulin lispro (HUMALOG KWIKPEN) 100 UNIT/ML KiwkPen Inject 18 Units into the skin 3 (three) times daily with meals.  07/16/17   [provider]  isosorbide mononitrate (IMDUR) 30 MG 24 hr tablet Take 1 tablet (30 mg total) by mouth daily. Please schedule appointment for refills. 10/14/18   Lelon Perla, MD  LANTUS SOLOSTAR 100 UNIT/ML Solostar Pen Inject 22 Units into the skin at bedtime. 04/29/18   [provider]  lisinopril (PRINIVIL,ZESTRIL) 10 MG tablet Take 10 mg by mouth daily.    [provider]  LYRICA 100 MG capsule Take 100 mg by mouth 2 (two) times daily.  05/16/18   [provider]  metoprolol tartrate (LOPRESSOR) 50 MG tablet Take 50 mg by mouth 2 (two) times daily.    Joline Salt, RN  mirtazapine (REMERON) 30 MG tablet Take 1 tablet by mouth at bedtime.  12/16/14   [provider]  nabumetone (RELAFEN) 750 MG tablet Take 1 tablet by mouth 2 (two) times daily. 01/18/15   [provider]  nitroGLYCERIN (NITROSTAT) 0.4 MG SL tablet Place 1 tablet (0.4 mg total) under the tongue every 5 (five) minutes as needed for chest pain. 08/14/12   Lelon Perla, MD  nystatin-triamcinolone (MYCOLOG II) cream Apply 1 application topically daily as needed (rash).     [provider]  oxyCODONE-acetaminophen (PERCOCET) 10-325 MG per tablet Take 1-2 tablets by mouth 2 (two) times daily as needed for pain.     [provider]  polycarbophil (FIBERCON) 625 MG tablet Take 625 mg by mouth daily.    [provider]  Potassium 99 MG TABS Take 1 tablet by mouth daily.    [provider]  sodium fluoride (PREVIDENT) 1.1 % GEL dental gel Place 1 application onto teeth daily.  11/08/16   [provider]  SOOLANTRA 1 % CREA Apply 1 application topically at bedtime as needed (rash).  06/01/18   [provider]  tamsulosin (FLOMAX) 0.4 MG CAPS capsule Take 0.4 capsules by mouth daily. 04/23/18   [provider]  TRICOR 145 MG tablet Take 1 tablet by mouth daily. 12/24/14   [provider]  ULORIC 40 MG tablet Take 1 tablet by mouth Daily. 06/04/12   [provider]  VIIBRYD 40 MG TABS Take 40 mg by mouth Daily.  06/12/12   [provider]    Physical Exam: Vitals:   01/07/23 1625  BP: 113/83  Pulse: 91  Resp: 16  Temp: 98.6 F (37 C)  TempSrc: Oral  Weight: 103.1 kg  Height: '6\' 2"'$  (1.88 m)    Physical Exam Constitutional:      General: He is not in acute distress.    Appearance: Normal appearance. He is obese.  HENT:     Head: Normocephalic and atraumatic.     Mouth/Throat:     Mouth: Mucous membranes are moist.     Pharynx: Oropharynx is clear.  Eyes:     Extraocular Movements: Extraocular movements intact.     Pupils: Pupils are equal, round, and reactive to light.  Cardiovascular:     Rate and Rhythm: Normal rate and regular rhythm.     Pulses: Normal pulses.     Heart sounds: Normal heart sounds.  Pulmonary:     Effort: Pulmonary effort is normal. No respiratory distress.     Breath sounds: Rales present.  Abdominal:     General: Bowel sounds are normal. There is no distension.     Palpations: Abdomen is soft.     Tenderness: There is no abdominal tenderness.  Musculoskeletal:        General: No swelling or deformity.     Right lower leg: Edema present.     Left lower leg: Edema present.  Skin:    General: Skin is warm and dry.  Neurological:     General: No focal deficit present.     Mental Status: Mental status is at baseline.    Labs on Admission: I have personally reviewed following labs and imaging studies  CBC: No results for input(s): "WBC", "NEUTROABS", "HGB", "HCT", "MCV", "PLT" in the last 168  hours.  Basic Metabolic Panel:  No results for input(s): "NA", "K", "CL", "CO2", "GLUCOSE", "BUN", "CREATININE", "CALCIUM", "MG", "PHOS" in the last 168 hours.  GFR: CrCl cannot be calculated (Patient's most recent lab result is older than the maximum 21 days allowed.).  Liver Function Tests: No results for input(s): "AST", "ALT", "ALKPHOS", "BILITOT", "PROT", "ALBUMIN" in the last 168 hours.  Urine analysis:    Component Value Date/Time   COLORURINE YELLOW 09/28/2007 1319   APPEARANCEUR CLEAR 09/28/2007 1319   LABSPEC 1.022 09/28/2007 1319   PHURINE 6.0 09/28/2007 1319   GLUCOSEU 250 (A) 09/28/2007 1319   HGBUR NEGATIVE 09/28/2007 1319   BILIRUBINUR NEGATIVE 09/28/2007 1319   KETONESUR NEGATIVE 09/28/2007 1319   PROTEINUR NEGATIVE 09/28/2007 1319   UROBILINOGEN 1.0 09/28/2007 1319   NITRITE NEGATIVE 09/28/2007 1319   LEUKOCYTESUR  09/28/2007 1319    NEGATIVE MICROSCOPIC NOT DONE ON URINES WITH NEGATIVE PROTEIN, BLOOD, LEUKOCYTES, NITRITE, OR GLUCOSE <1000 mg/dL.    Radiological Exams on Admission: No results found.  EKG: EKG at outside hospital unavailable but read available and showed sinus rhythm with sinus arrhythmia and first-degree AV block.  Also noted nonspecific ST and T wave abnormality as well as prolonged QT.  Assessment/Plan Principal Problem:   NSTEMI (non-ST elevated myocardial infarction) ([place]) Active Problems:   DM (diabetes mellitus) ([place])   Hyperlipidemia   Asthma   GERD   Sleep apnea   CAD (coronary artery disease) of artery bypass graft   Bradycardia   Essential hypertension   Atherosclerosis of native arteries of extremity with intermittent claudication (HCC)   NSTEMI CAD > Known history of CAD status post CABG and multiple stenting's. > Presenting after multiple days of not taking her medications. > Presented with shortness of breath, no chest pain.  Found to have non high-sensitivity troponin elevated to 15. (Cut off 0.034). > Patient  preferred cardiology at [facility] and they were consulted by EDP at [facility] and accepted patient for consultation with [facility] admitting.  Recommended heparin drip, Plavix and aspirin which was given. - Monitor on progressive unit - Prescient cardiology recommendations - Continue with heparin drip - Continue with aspirin and Plavix - Continue with home Zetia, atorvastatin, Tricor - Continue home Imdur, metoprolol - Hold lisinopril - Echocardiogram - Trend troponin - Repeat EKG  Acute on chronic diastolic CHF > Last echo in the system showed grade 2 diastolic dysfunction.  Also noted to have cath in 2019 showing EF 50-55%. > Not taking medications for the last several days as above.  Came in with proBNP at outside facility elevated to greater than 13,000.  Chest x-ray with evidence of pulmonary edema and pleural effusion. > Received 80 mg IV Lasix at outside ED. > Currently is stable and not on oxygen. - Monitor on progressive unit - Continue with Lasix 40 mg IV twice daily - Strict I's and O's, daily weights - Echocardiogram - Trend renal function and electrolytes, check magnesium - Continue home Imdur, metoprolol - Hold lisinopril  Hypertension - Receiving IV Lasix as above - Continue home metoprolol, Imdur as above - Holding home lisinopril for now  CKD 3a > Creatinine stable at 1.6 in the ED. - Receiving Lasix as above - Trend renal function and electrolytes  Diabetes Diabetic neuropathy > 35 units at night and 28 units with meals at home - 20 units at night, 10 units with meals, SSI - Continue home Lyrica   PAD Hyperlipidemia - Continue, Plavix - Continue home Zetia, atorvastatin  Anxiety Depression - Continue home  Viibryd and mirtazapine  GERD - Continue home PPI  DVT prophylaxis: Heparin Code Status:   Full Family Communication:  Updated at bedside Disposition Plan:   Patient is from:  Home  Anticipated DC to:  Home  Anticipated DC date:  2 to  4 days  Anticipated DC barriers: None  Consults called:  Cardiology Admission status:  Inpatient, progressive  Severity of Illness: The appropriate patient status for this patient is INPATIENT. Inpatient status is judged to be reasonable and necessary in order to provide the required intensity of service to ensure the patient's safety. The patient's presenting symptoms, physical exam findings, and initial radiographic and laboratory data in the context of their chronic comorbidities is felt to place them at high risk for further clinical deterioration. Furthermore, it is not anticipated that the patient will be medically stable for discharge from the hospital within 2 midnights of admission.   * I certify that at the point of admission it is my clinical judgment that the patient will require inpatient hospital care spanning beyond 2 midnights from the point of admission due to high intensity of service, high risk for further deterioration and high frequency of surveillance required.Marcelyn Bruins MD [facility]  How to contact the [facility] Attending or Consulting provider [place] or covering provider during after hours [place], for this patient?   Check the care team in [facility] and look for a) attending/consulting [facility] provider listed and b) the [facility] team listed Log into [email or web address] and use [place]'s universal password to access. If you do not have the password, please contact the hospital operator. Locate the [facility] provider you are looking for under [facility] and page to a number that you can be directly reached. If you still have difficulty reaching the provider, please page the [facility] (Director on Call) for the Hospitalists listed on amion for assistance.  01/07/2023, 5:22 PM

## 2023-01-07 NOTE — Consult Note (Signed)
Cardiology Consult    Patient ID: Dylan Bruce MRN: [id number], DOB/AGE: 09-Aug-1946   Admit date: 01/07/2023 Date of Consult: 01/07/2023 Requesting Provider: Neva Seat, MD  PCP:  [name], MD   [facility] Providers Cardiologist: Prior patient of Dr. Stanford Breed, now followed by [facility] Cardiology  Patient Profile    Dylan Bruce is a 77 y.o. male with a history of CAD with 4v CABG 2003 and PCI of SVG-PDA (SVG-Diag and SVG-rPDA now occluded), moderate aortic stenosis, HFpEF, HTN, PAD, type 2 DM, psoriatic arthritis, OSA on CPAP, and CKD stage III. He is being seen today (01/07/2023) for the evaluation of   History of Present Illness   He was previously followed by Dr. Stanford Breed for CAD following CABG in 2003 and subsequent PCI of the SVG-PDA in 2010. SVG-D1 was occluded at that time, but other grafts to OMs and LAD patent. Myoview in 2013 had shown lateral infarct with peri-infarct ischemia. He was last seen by Dr. Stanford Breed in 2016. He established care with [facility]iology initially for cardiac clearance prior to shoulder surgery in 2018 and has been managed there since for his CV care. He has followed closely with Dr. [name] of [facility] family medicine for many years and receives all other specialty care in the [facility] system. He was subsequently seen in the valve clinic there in 2021 for worsening dyspnea and some progression of moderate AS (Vmax 3.2, MG 21 mmHg, DVI 0.32, AVA 1.9 cm2). Coronary angiography showed known occlusions of grafts to a large D1 with 90% ostial stenosis and an rPDA. The native mid-RCA is a CTO. The LIMA-LAD and SVG-OM were patent. L/RHC showed a mean AV Gradient of 32 mmHg, AVA of 1.2 cm2, mild post-capillary PH, and preserved output. Coronary disease was felt to be stable and AS moderate, so continued medical management was recommended at that time. EF has remained preserved.   He presented to [facility] earlier today with several days of worsening dyspnea,  orthopnea, and lower extremity edema. No chest pain. He admitted to not taking any of his medications for the last few days. He was started on Augmentin yesterday by his PCP for a possible sinus infection.   OSH lab workup showed conventional troponin of 15, proBNP 13,500, negative respiratory panel, stable creatinine of 1.6, glucose 351, T bili 1.4. Hgb 10.9. ECG unavailable but reportedly showed SR with known first degree AVB and only nonspecific ST-T wave abnormalities. Chest x-ray with pulmonary edema and pleural effusion. HR 90s and normotensive, oxygenating adequately on room air. He was given Lasix '80mg'$  and started on insulin and heparin infusions. Also loaded with aspirin and Plavix, then transferred to [facility] for further management (per his request). Initial troponin on arrival here is 11,393.  Wife had stroke last year and treated at [facility] and recov3ered well - wants to transfer back here. He goes a few weeks of sleeping well then will have a couple weeks of having what sounds like orthopnea. Thinks som eof this is mental. Baselin4e weight is 232, but hasn't been good recently about checking..  If he takes nitro helsp with orthopnea for the evening. Also if he sleeps in his recliner for a couple hours then gets back in bed Sinus trouble last week and developed fever of 101 and flu-like symptoms by Friday. Saturday legs were huge, couldn't eat, worse feeling bad, realized he hadn't taken his pills in 4 days. Dyspnea just walking aroudn house. Usually walks 1/4 mile. No chest  pain. Takes lasix 80 in am then additional lasix 40-80 in PM - bases it on convenience/schedule.   Past Medical History   Past Medical History:  Diagnosis Date   ARTHRITIS    ASTHMA    CAD (coronary artery disease)    CHF (congestive heart failure) ([place])    DM    Essential hypertension, malignant 07/17/2012   GERD    HYPERLIPIDEMIA    Hypertension    PSORIASIS    SLEEP APNEA    SPINA BIFIDA     Past Surgical  History:  Procedure Laterality Date   BACK SURGERY     CARPAL TUNNEL RELEASE Left 08/21/2019   Procedure: CARPAL TUNNEL RELEASE;  Surgeon: Daryll Brod, MD;  Location: [place];  Service: Orthopedics;  Laterality: Left;  AXILLARY BLOCK   CORONARY ARTERY BYPASS GRAFT     JOINT REPLACEMENT Left    shoulder   LEFT HEART CATH AND CORS/GRAFTS ANGIOGRAPHY N/A 06/18/2018   Procedure: LEFT HEART CATH AND CORS/GRAFTS ANGIOGRAPHY;  Surgeon: Lorretta Harp, MD;  Location: [place] CV [facility];  Service: Cardiovascular;  Laterality: N/A;   TRIGGER FINGER RELEASE Left 08/21/2019   Procedure: RELEASE TRIGGER LEFT SMALL FINGER LEFT INDEX;  Surgeon: Daryll Brod, MD;  Location: [place];  Service: Orthopedics;  Laterality: Left;   ULNAR NERVE TRANSPOSITION Left 08/21/2019   Procedure: DECOMPRESSION WITH ULNAR NERVE LEFT CUBITAL TUNNEL ULNAR;  Surgeon: Daryll Brod, MD;  Location: [place];  Service: Orthopedics;  Laterality: Left;     Allergies  Allergen Reactions   Cefuroxime Diarrhea and Nausea And Vomiting    Severe   Ibuprofen Anaphylaxis and Shortness Of Breath        Cefpodoxime Rash   Ciprofloxacin Rash   Doxycycline Rash   Sulfamethoxazole-Trimethoprim Rash   Fluocinolone Other (See Comments)    unknown   Probenecid Other (See Comments)    unknown   Allopurinol Rash   Azithromycin Rash   Sulfa Antibiotics Rash   Inpatient Medications     [START ON 01/08/2023] aspirin EC  81 mg Oral Daily   [START ON 01/08/2023] pantoprazole  40 mg Oral Daily   sodium chloride flush  3 mL Intravenous Q12H    Family History     Daughter developed rheumatoid arthritis at age 60. Negative for diabetes, kidney disease or coronary artery disease. Mother at age 37 and father at age 24 both "died of old age".    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  Physical Exam    Blood pressure 113/83, pulse 91, temperature 98.6 F (37 C), temperature source Oral, resp. rate 16, height '6\' 2"'$  (1.88 m), weight 103.1 kg.     Intake/Output Summary (Last 24 hours) at 01/07/2023 1809 Last data filed at 01/07/2023 1800 Gross per 24 hour  Intake 0.4 ml  Output --  Net 0.4 ml   Wt Readings from Last 3 Encounters:  01/07/23 103.1 kg  08/21/19 112.5 kg  09/03/18 105.2 kg    CONSTITUTIONAL: alert and conversant, well-appearing, nourished, no distress HEENT: normal NECK: no JVD, no masses CARDIAC: Regular rhythm. Normal S1/S2, no S3/S4. No murmur. No friction rub. JVP ***  VASCULAR: Radial pulses intact bilaterally. No carotid bruits. PULMONARY/CHEST WALL: no deformities, normal breath sounds bilaterally, normal work of breathing ABDOMINAL: soft, non-tender, non-distended EXTREMITIES: no edema, no muscle atrophy, warm and well-perfused SKIN: Dry and intact without apparent rashes or wounds. No peripheral cyanosis. NEUROLOGIC: alert, no abnormal movements, cranial nerves grossly intact. PSYCH: normal affect, normal speech and language   Labs    OSH labs reviewed with pertinent findings noted in HPI.   Radiology Studies    No results found.  ECG & Cardiac Imaging    Coronary/graft angio, RLHC (06/2020, [facility]): RA mean: 10 mmHg RV: 35/11 mmHg PA (mean): 35/22 (25) mmHg PCWP mean: 16 mmHg PA Sat: 65 % Art Sat: 96 % Fick Cardiac output: 6.2 L/min Fick Cardiac index: 2.6 L/min/m2  Left Heart Catheterization Left LVEDP: 18 mmHg  Invasive Aortic Valve Hemodynamic Study: Mean AV  Gradient: 32 mmHg Aortic Valve Area: 1.2 cm2  Coronary Angiography  Aortic Pressure: 131/75 (97) mmHg  Coronary arteries: Dominance: Right Left Main: 20% mid shaft stenosis Left Anterior Descending: Large vessel with a 50% proximal stenosis. 100% occluded after a large D1. The ostium of the D1 has a focal 90% stenosis.  Left Circumflex: Large vessel. There is a moderate-sized ramus. OM1 and OM2 are also moderate in size. Diffuse mild CAD.  Right Coronary: 100% occluded in the mid segment.   Bypass Graft Angiography: LIMA to the LAD: Widely patent SVG to OM: Widely patent SVG to Diag: 100% occluded SVG to rPDA: 100 % occluded   TTE (05/2020, [facility]): Severe LVH, EF > 55%, grade 2 DD  RV normal Mild LA dilation Mild MR Moderate AS   Peak AV transvalvular velocity:  3.2 m/s.    Mean gradient: 21 mmHg.    Doppler velocity index: 0.32.    Estimated aortic valve area (VTI): 1.9 cm2.  Normal right atrial pressure  There is no pericardial effusion   Lower extremity arterial duplex (2019, [facility]): Right Lower Extremity   ABI: 0.75   Inflow: Normal common femoral arterial waveforms and velocities. No  evidence of inflow (aortoiliac) disease.   Outflow: No focal elevation of the peak systolic velocity in the  superficial femoral artery to suggest focal stenosis. However, the  waveforms transition from triphasic to monophasic at the distal  thigh. Monophasic flow throughout the popliteal artery. Focal  elevation of the peak systolic velocity in the distal aspect again  noted consistent with a hemodynamically significant stenosis.   Runoff: Significantly increased peak systolic velocity in the  proximal anterior tibial artery consistent with runoff disease.  Monophasic flow throughout the runoff arteries.   Left Lower Extremity   ABI: 1.0   Inflow: Calcified atherosclerotic plaque in the common femoral  bifurcation extends into the deep femoral artery. There is  significant  elevation of the peak systolic velocity in the proximal  deep femoral artery consistent with stenosis. No evidence of SFA or  popliteal stenosis.   Outflow: Normal profunda femoral, superficial femoral and popliteal  arterial waveforms and velocities. No focal elevation of the PSV to  suggest stenosis.   Runoff: Elevated peak systolic velocity in the proximal anterior  tibial artery consistent with runoff disease.   IMPRESSION:  1. Abnormal resting right ankle-brachial index of 0.75 consistent  with at least moderate peripheral arterial disease.  2. Normal resting left ankle-brachial index of 1.0.  3. Imaging findings suggest hemodynamically significant stenosis in  the distal right popliteal and proximal right anterior tibial  arteries. Imaging findings suggest hemodynamically significant  stenosis in the proximal right profunda femoral artery and proximal  right anterior tibial artery.   Lexiscan MPI (05/2017, [facility]): Nuclear Perfusion Findings:  There is a large in size, severe, fixed defect involving the basal  inferolateral, basal anterolateral, mid inferolateral, mid anterolateral  and apical lateral segments. This is consistent with scar.   Nuclear Wall Motion Findings:  Post stress: Global systolic function is low normal. The anterolateral and  inferolateral walls are akinetic. The ejection fraction calculated at 52%.    ECG: pending. Prior ECGs show long first degree AVB with T-P fusion at higher rates that likely accounts for the reported "long" perceived QT intervals.   Assessment & Plan    Acute on chronic HFpEF, NYHA class CAD with NSTEMI and history of 4v CABG (2 grafts occluded) Moderate aortic stenosis, stage B (as of 2021) Peripheral arterial disease with claudication CKD stage III, stable (baseline GFR 42-45) Iron deficiency anemia (recent ferritin < 20) Type 2 DM, A1c 8.4 OSA, on CPAP  He was having significant heart failure issues with what was no worse  than moderate AS, so I don't think this necessarily represents valvular disease progression. I am unable to see any of the angiograms, but by report, he has at least a large D1 and distal RCA territory that are un-revascularized since his vein grafts are now occluded. 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 He is on chronic high-dose opiates as well. I do think he warrants re-evaluation of both his AS and coronary anatomy at this point, but first needs diuresis.  - Lasix IV at least twice daily titrated for daily goal of 3L negative  - Monitor daily standing weights, strict I&Os, BMP/Mg at least daily - Continue DAPT, heparin infusion, atorvastatin, Zetia - Agree with continuing Imdur - Would also continue lisinopril, renal function at baseline - Decrease metoprolol to '25mg'$  BID given recent non-compliance, long first degree AVB, and elevated HR which may be compensatory in the setting of decompensated heart failure.   - Needs updated TTE and lipids/A1c - Left/right heart cath once more euvolemic - Please ensure CPAP while sleeping  Signed, Marykay Lex, MD 01/07/2023, 6:09 PM  For questions or updates, please contact   Please consult [email or web address] for contact info under Cardiology/STEMI.

## 2023-01-07 NOTE — Progress Notes (Signed)
ANTICOAGULATION CONSULT NOTE - Initial Consult  Pharmacy Consult for heparin Indication: chest pain/ACS  Allergies  Allergen Reactions   Cefuroxime Diarrhea and Nausea And Vomiting    Severe   Ibuprofen Anaphylaxis and Shortness Of Breath        Cefpodoxime Rash   Ciprofloxacin Rash   Doxycycline Rash   Sulfamethoxazole-Trimethoprim Rash   Fluocinolone Other (See Comments)    unknown   Probenecid Other (See Comments)    unknown   Allopurinol Rash   Azithromycin Rash   Sulfa Antibiotics Rash    Patient Measurements: Height: '6\' 2"'$  (188 cm) Weight: 103.1 kg (227 lb 6.4 oz) IBW/kg (Calculated) : 82.2 Heparin Dosing Weight: 102.9 kg  Vital Signs: Temp: 98.6 F (37 C) (03/03 1625) Temp Source: Oral (03/03 1625) BP: 113/83 (03/03 1625) Pulse Rate: 91 (03/03 1625)  Labs: No results for input(s): "HGB", "HCT", "PLT", "APTT", "LABPROT", "INR", "HEPARINUNFRC", "HEPRLOWMOCWT", "CREATININE", "CKTOTAL", "CKMB", "TROPONINIHS" in the last 72 hours.  CrCl cannot be calculated (Patient's most recent lab result is older than the maximum 21 days allowed.).   Medical History: Past Medical History:  Diagnosis Date   ARTHRITIS    ASTHMA    CAD (coronary artery disease)    CHF (congestive heart failure) (HCC)    DM    GERD    HYPERLIPIDEMIA    Hypertension    PSORIASIS    SLEEP APNEA    SPINA BIFIDA     Assessment: 77 yo male presented with shortness of breath for several days. Transferred to [facility] already on heparin infusion at 1200 units/hr. Not on any anticoagulation other than plavix PTA. Pharmacy consulted to follow heparin drip.    Goal of Therapy:  Heparin level 0.3-0.7 units/ml Monitor platelets by anticoagulation protocol: Yes   Plan:  Continue heparin infusion at 1200 units/hr Check anti-Xa level in 8 hours and daily while on heparin Continue to monitor H&H and platelets  Alanda Slim, PharmD, [facility] Clinical Pharmacist Please see AMION for all Pharmacists'  Contact Phone Numbers 01/07/2023, 5:26 PM

## 2023-01-07 NOTE — Progress Notes (Signed)
Notified by lab of critical lab value.  Troponin 11,393.  Pt denies CP, and VSS.  Dr. Sidney Ace notified.  Jodell Cipro

## 2023-01-07 NOTE — Progress Notes (Signed)
Plan of Care Note for accepted transfer   Patient: Dylan Bruce MRN: [id number]   DOA: (Not on file)  Facility requesting transfer: [facility] Requesting Provider: Dr. Verdis Frederickson, [place] Reason for transfer: Cardiology services requested Facility course: History of CAD status post CABG x 5, stenting.  Systolic CHF with last echo in 2021 per chart review with EF greater than 55%, G2 DD.   Patient presented with shortness of breath for several days in the setting of not taking medication for the last 4 days or so for unclear reasons.  Also with significant worsening edema.  Lab workup showed none high-sensitivity troponin of 15, proBNP of greater than 13,000, negative respiratory panel.  CMP significant for stable creatinine of 1.6, glucose 351, AST 68, T. bili 1.4.  CBC with leukocytosis of 12.4 and hemoglobin 10.9.  Chest x-ray with pulmonary edema and pleural effusion.  Patient started on insulin drip, given 80 mg IV Lasix and started on heparin drip after talking with cardiology here.  Patient has been accepted by cardiology here with [place] to admit.  Recommending aspirin, Plavix, heparin drip which has been given.  Vital signs stable at outside ED at this time.  Plan of care: The patient is accepted for admission to Progressive unit, at [facility]..   Author: Marcelyn Bruins, MD 01/07/2023  Check [email or web address] for on-call coverage.  Nursing staff, Please call [place] number on Amion as soon as patient's arrival, so appropriate admitting provider can evaluate the pt.

## 2023-01-08 ENCOUNTER — Inpatient Hospital Stay (HOSPITAL_COMMUNITY): Payer: BC Managed Care – PPO

## 2023-01-08 DIAGNOSIS — E669 Obesity, unspecified: Secondary | ICD-10-CM

## 2023-01-08 DIAGNOSIS — I10 Essential (primary) hypertension: Secondary | ICD-10-CM | POA: Diagnosis not present

## 2023-01-08 DIAGNOSIS — I5033 Acute on chronic diastolic (congestive) heart failure: Secondary | ICD-10-CM | POA: Diagnosis not present

## 2023-01-08 DIAGNOSIS — I214 Non-ST elevation (NSTEMI) myocardial infarction: Secondary | ICD-10-CM | POA: Diagnosis not present

## 2023-01-08 DIAGNOSIS — I5021 Acute systolic (congestive) heart failure: Secondary | ICD-10-CM | POA: Diagnosis not present

## 2023-01-08 DIAGNOSIS — I350 Nonrheumatic aortic (valve) stenosis: Secondary | ICD-10-CM

## 2023-01-08 DIAGNOSIS — E663 Overweight: Secondary | ICD-10-CM | POA: Diagnosis present

## 2023-01-08 LAB — GLUCOSE, CAPILLARY
Glucose-Capillary: 214 mg/dL — ABNORMAL HIGH (ref 70–99)
Glucose-Capillary: 101 mg/dL — ABNORMAL HIGH (ref 70–99)
Glucose-Capillary: 62 mg/dL — ABNORMAL LOW (ref 70–99)
Glucose-Capillary: 90 mg/dL (ref 70–99)
Glucose-Capillary: 195 mg/dL — ABNORMAL HIGH (ref 70–99)
Glucose-Capillary: 162 mg/dL — ABNORMAL HIGH (ref 70–99)

## 2023-01-08 LAB — COMPREHENSIVE METABOLIC PANEL
ALT: 31 U/L (ref 0–44)
AST: 53 U/L — ABNORMAL HIGH (ref 15–41)
Albumin: 3.1 g/dL — ABNORMAL LOW (ref 3.5–5.0)
Alkaline Phosphatase: 96 U/L (ref 38–126)
Anion gap: 11 (ref 5–15)
BUN: 45 mg/dL — ABNORMAL HIGH (ref 8–23)
CO2: 26 mmol/L (ref 22–32)
Calcium: 9.4 mg/dL (ref 8.9–10.3)
Chloride: 100 mmol/L (ref 98–111)
Creatinine, Ser: 1.64 mg/dL — ABNORMAL HIGH (ref 0.61–1.24)
GFR, Estimated: 44 mL/min — ABNORMAL LOW (ref >60)
Glucose, Bld: 222 mg/dL — ABNORMAL HIGH (ref 70–99)
Potassium: 3.8 mmol/L (ref 3.5–5.1)
Sodium: 137 mmol/L (ref 135–145)
Total Bilirubin: 0.8 mg/dL (ref 0.3–1.2)
Total Protein: 7.3 g/dL (ref 6.5–8.1)

## 2023-01-08 LAB — HEPARIN LEVEL (UNFRACTIONATED)
Heparin Unfractionated: <0.1 IU/mL — ABNORMAL LOW (ref 0.30–0.70)
Heparin Unfractionated: <0.1 IU/mL — ABNORMAL LOW (ref 0.30–0.70)
Heparin Unfractionated: <0.1 IU/mL — ABNORMAL LOW (ref 0.30–0.70)

## 2023-01-08 LAB — ECHOCARDIOGRAM COMPLETE
AR max vel: 0.78 cm2
AV Area VTI: 0.65 cm2
AV Area mean vel: 0.71 cm2
AV Mean grad: 35.8 mmHg
AV Peak grad: 61.1 mmHg
Ao pk vel: 3.91 m/s
Area-P 1/2: 3.99 cm2
Est EF: 55
Height: 74 in
S' Lateral: 3 cm
Weight: 3742.53 oz

## 2023-01-08 LAB — CBC
HCT: 33.4 % — ABNORMAL LOW (ref 39.0–52.0)
Hemoglobin: 10.8 g/dL — ABNORMAL LOW (ref 13.0–17.0)
MCH: 25.5 pg — ABNORMAL LOW (ref 26.0–34.0)
MCHC: 32.3 g/dL (ref 30.0–36.0)
MCV: 79 fL — ABNORMAL LOW (ref 80.0–100.0)
Platelets: 319 10*3/uL (ref 150–400)
RBC: 4.23 MIL/uL (ref 4.22–5.81)
RDW: 16.8 % — ABNORMAL HIGH (ref 11.5–15.5)
WBC: 9.3 10*3/uL (ref 4.0–10.5)
nRBC: 0 % (ref 0.0–0.2)

## 2023-01-08 LAB — TROPONIN I (HIGH SENSITIVITY)
Troponin I (High Sensitivity): 4319 ng/L (ref <18)
Troponin I (High Sensitivity): 4530 ng/L (ref <18)

## 2023-01-08 MED ORDER — POTASSIUM CHLORIDE CRYS ER 20 MEQ PO TBCR
40.0000 meq | EXTENDED_RELEASE_TABLET | Freq: Every day | ORAL | Status: DC
  Administered 2023-01-08 – 2023-01-12 (×5): 40 meq via ORAL
  Filled 2023-01-08 (×5): qty 2

## 2023-01-08 MED ORDER — OXYCODONE HCL 5 MG PO TABS
5.0000 mg | ORAL_TABLET | Freq: Three times a day (TID) | ORAL | Status: DC | PRN
  Administered 2023-01-09 – 2023-01-12 (×5): 5 mg via ORAL
  Filled 2023-01-08 (×5): qty 1

## 2023-01-08 MED ORDER — HEPARIN (PORCINE) 25000 UT/250ML-% IV SOLN
2400.0000 [IU]/h | INTRAVENOUS | Status: DC
  Administered 2023-01-08: 1900 [IU]/h via INTRAVENOUS
  Administered 2023-01-09: 2100 [IU]/h via INTRAVENOUS
  Administered 2023-01-09 – 2023-01-10 (×2): 2300 [IU]/h via INTRAVENOUS
  Administered 2023-01-10 – 2023-01-11 (×2): 2400 [IU]/h via INTRAVENOUS
  Filled 2023-01-08 (×7): qty 250

## 2023-01-08 MED ORDER — SODIUM CHLORIDE 0.9 % IV SOLN
200.0000 mg | INTRAVENOUS | Status: AC
  Administered 2023-01-08 – 2023-01-12 (×5): 200 mg via INTRAVENOUS
  Filled 2023-01-08 (×5): qty 10

## 2023-01-08 MED ORDER — OXYCODONE-ACETAMINOPHEN 5-325 MG PO TABS
1.0000 | ORAL_TABLET | Freq: Three times a day (TID) | ORAL | Status: DC | PRN
  Administered 2023-01-08 – 2023-01-09 (×3): 2 via ORAL
  Administered 2023-01-10 – 2023-01-12 (×5): 1 via ORAL
  Filled 2023-01-08 (×3): qty 1
  Filled 2023-01-08 (×2): qty 2
  Filled 2023-01-08: qty 1
  Filled 2023-01-08: qty 2
  Filled 2023-01-08: qty 1

## 2023-01-08 MED ORDER — LISINOPRIL 5 MG PO TABS
5.0000 mg | ORAL_TABLET | Freq: Every day | ORAL | Status: DC
  Administered 2023-01-08 – 2023-01-12 (×5): 5 mg via ORAL
  Filled 2023-01-08 (×5): qty 1

## 2023-01-08 MED ORDER — FUROSEMIDE 10 MG/ML IJ SOLN
120.0000 mg | Freq: Two times a day (BID) | INTRAVENOUS | Status: DC
  Administered 2023-01-08 – 2023-01-10 (×5): 120 mg via INTRAVENOUS
  Filled 2023-01-08 (×2): qty 12
  Filled 2023-01-08 (×2): qty 2
  Filled 2023-01-08: qty 10
  Filled 2023-01-08: qty 2
  Filled 2023-01-08: qty 10

## 2023-01-08 MED ORDER — FUROSEMIDE 10 MG/ML IJ SOLN
80.0000 mg | Freq: Two times a day (BID) | INTRAMUSCULAR | Status: DC
  Administered 2023-01-08: 80 mg via INTRAVENOUS
  Filled 2023-01-08: qty 8

## 2023-01-08 MED ORDER — INSULIN ASPART 100 UNIT/ML IJ SOLN
8.0000 [IU] | Freq: Three times a day (TID) | INTRAMUSCULAR | Status: DC
  Administered 2023-01-08 – 2023-01-12 (×8): 8 [IU] via SUBCUTANEOUS

## 2023-01-08 MED ORDER — HEPARIN BOLUS VIA INFUSION
3000.0000 [IU] | Freq: Once | INTRAVENOUS | Status: AC
  Administered 2023-01-08: 3000 [IU] via INTRAVENOUS
  Filled 2023-01-08: qty 3000

## 2023-01-08 MED ORDER — HEPARIN BOLUS VIA INFUSION
2000.0000 [IU] | Freq: Once | INTRAVENOUS | Status: AC
  Administered 2023-01-08: 2000 [IU] via INTRAVENOUS
  Filled 2023-01-08: qty 2000

## 2023-01-08 NOTE — Assessment & Plan Note (Addendum)
Blood pressure has been controlled, plan to continue with lisinopril and isosorbide.

## 2023-01-08 NOTE — Progress Notes (Addendum)
[name]  Patient: Dylan Bruce    [id number]  DOA: 01/07/2023    Date of Service: the patient was seen and examined on 01/08/2023  Brief hospital course: Patient is a 77 year old male with past medical history of CAD status post CABG, obesity, diabetes mellitus, sleep apnea and stage IIIb chronic kidney disease who presented to [facility] on 3/3 with complaints of several days of shortness of breath. (Patient states unexplainably that he had stopped taking his medicines for several days.  He cannot say why.  He did not run out.  He is usually very compliant with his medications.  In the emergency room, patient found to have markedly elevated proBNP of 13,500 and found to be in acute heart failure.  Also noted to have mildly elevated troponins and it was requested patient be transferred to [facility] for evaluation.  Patient was accepted by [facility] and admitted afternoon of 3/3.  Cardiology consulted and patient started on IV Lasix.  Following arrival, repeat blood work done noting high sensitive troponin of 11,000.  Echocardiogram ordered.  Plan is for cardiac cath once diuresed enough that patient is able to lie flat.   Assessment and Plan: * NSTEMI (non-ST elevated myocardial infarction) (HCC) Troponin is trending downward.  Plan for cardiac cath once patient can lie flat.  Continue heparin infusion  Acute on chronic diastolic CHF (congestive heart failure) (HCC) Echocardiogram pending.  Continue Lasix.  Patient has already diuresed almost 1 L  Essential hypertension Should improve with diuresis, continue home medications  DM (diabetes mellitus) ([place]) CBG still somewhat elevated.  Ingesting scheduled NovoLog with lunch.  Continue sliding scale plus Lantus.  A1c pending.  Sleep apnea Continue CPAP, patient endorses compliance  Obesity (BMI 30-39.9) Meets criteria BMI greater than 30       Body mass index is 30.03 kg/m.         Consultants: Cardiology  Procedures: Echocardiogram pending Cardiac cath planned  Antimicrobials: None  Code Status: Full code   Subjective: Patient feeling better, denies current shortness of breath while sitting upright  Objective: Vital signs were reviewed and unremarkable. Vitals:   01/08/23 0731 01/08/23 0800  BP: 132/82 137/83  Pulse: 98   Resp: 16   Temp: 99.4 F (37.4 C)   SpO2: 92%     Intake/Output Summary (Last 24 hours) at 01/08/2023 1425 Last data filed at 01/08/2023 0700 Gross per 24 hour  Intake 401.73 ml  Output 1240 ml  Net -838.27 ml   Filed Weights   01/07/23 1625 01/08/23 0424  Weight: 103.1 kg 106.1 kg   Body mass index is 30.03 kg/m.  Exam:  General: Alert and oriented x 3, no acute distress HEENT: Normocephalic atraumatic, mucous membranes are moist Cardiovascular: Regular rate and rhythm, S1-S2, 2 out of 6 systolic ejection murmur Respiratory: Decreased breath sounds bibasilar Abdomen: Soft, nontender, nondistended, positive bowel sounds Musculoskeletal: No clubbing or cyanosis, 1+ pitting edema bilaterally Skin: No skin breaks, tears or lesions Psychiatry: Appropriate, no evidence of psychoses Neurology: No focal deficits  Data Reviewed: Creatinine down to 1.64, troponin down to 4300  Disposition:  Status is: Inpatient Remains inpatient appropriate because:  -Needs cardiac cath -Full diuresis    Anticipated discharge date: 3/7  Family Communication: Son at the bedside DVT Prophylaxis: Heparin infusion    Author: Annita Brod ,MD 01/08/2023 2:25 PM  To reach On-call, see care teams to locate the attending and reach out via [email or web address]. Between 7PM-7AM, please  contact night-coverage If you still have difficulty reaching the attending provider, please page the [facility] (Director on Call) for [facility] on amion for assistance.

## 2023-01-08 NOTE — Progress Notes (Signed)
ANTICOAGULATION CONSULT NOTE - Follow Up Consult  Pharmacy Consult for heparin Indication:  NSTEMI  Labs: Recent Labs    01/07/23 1757 01/08/23 0207  HGB  --  10.8*  HCT  --  33.4*  PLT  --  319  HEPARINUNFRC  --  <0.10*  CREATININE  --  1.64*  TROPONINIHS 11,393*  --     Assessment: 76yo male subtherapeutic on heparin with initial dosing for NSTEMI; no signs of bleeding per RN but she notes that earlier in shift heparin was running though [facility] and line was repeatedly occluded.  Goal of Therapy:  Heparin level 0.3-0.7 units/ml   Plan:  Will give heparin 2000 units IV bolus and increase heparin infusion by 2 units/kg/hr to 1400 units/hr and check level in 6-8 hours.    Wynona Neat, PharmD, BCPS  01/08/2023,3:57 AM

## 2023-01-08 NOTE — Progress Notes (Signed)
Dr. Eugenie Norrie gave order over the phone to give one time dose of 2 Oxycodone 5/325 pills at 1950.

## 2023-01-08 NOTE — Hospital Course (Addendum)
Dylan Bruce was admitted to the hospital with the working diagnosis of NSTEMI.   77 year old male with past medical history of CAD status post CABG, obesity, diabetes mellitus, sleep apnea and stage IIIb chronic kidney disease who presented to [facility] on 3/3 with complaints of several days of shortness of breath. (Patient states unexplainably that he had stopped taking his medicines for several days.  He cannot say why.  He did not run out.  He is usually very compliant with his medications.   In the emergency room, patient found to have markedly elevated proBNP of 13,500 and found to be in acute heart failure.   Also noted to have mildly elevated troponins and it was requested patient be transferred to [facility] for evaluation.   Patient was accepted by [facility] and admitted afternoon of 3/3.   At the time of his transfer his blood pressure was 113/83, HR 91, RR 16, lungs with no wheezing but positive rales, heart with S1 and S2 present and regular with no gallops, or rubs, positive systolic murmur at the base, abdomen with no distention, positive lower extremity edema.   Na 137, K 3,8 Cl 100, bicarbonate 26, glucose 222 bun 45 cr 1,64  High sensitive troponin 4,319 Wbc 9,3 hgb 10,8 plt 319   EKG 58 bpm, normal axis, normal qrs and qtc, 2nd degree AV block type 1, with no significant ST segment changes, negative T wave lead I and AvL.   Cardiology consulted and patient started on IV Lasix and continued with IV heparin.   03/07 improved volume status, cardiac catheterization with chronic and stable CAD. Recommendations to continue medical therapy and follow up as outpatient for TAVR workup.

## 2023-01-08 NOTE — Assessment & Plan Note (Signed)
Continue CPAP, patient endorses compliance

## 2023-01-08 NOTE — Assessment & Plan Note (Addendum)
Patient was placed on IV heparin for anticoagulation.  Medical therapy with aspirin, clopidogrel and statin.  No further chest pain.   Echocardiogram with preserved LV systolic function EF 83%. Mid distal lateral wall with hypokinesis.    03/07 cardiac catheterization with chronic total occlusion to the mid LAD, fills from the patent LIMA graft. SVG to the diagonal is known to be occluded. Small to to moderate caliber diagonal branch with diffuse severe disease, unchanged. OM branch with chronic occlusion, fills form the patent SVG graft. Chronic occlusion of RCA, filled from left and right collateral.   No focal targets for PCI.  Continue medical therapy.

## 2023-01-08 NOTE — Progress Notes (Signed)
Rounding Note    Patient Name: Dylan Bruce Date of Encounter: 01/08/2023  [place] Cardiologist: None  new  remote Dr. Stanford Breed  Subjective   Pt remotely seen by Dr. Stanford Breed in 2016 and then [facility] until 2022.  Seen in ER at [facility] yesterday with NSTEMI.  Today pt up to BR and HR 120s with 1st degree AV block other times CHB   Inpatient Medications    Scheduled Meds:  aspirin EC  81 mg Oral Daily   atorvastatin  80 mg Oral Daily   clopidogrel  75 mg Oral Daily   ezetimibe  10 mg Oral Daily   furosemide  80 mg Intravenous BID   insulin aspart  0-15 Units Subcutaneous TID WC   insulin aspart  0-5 Units Subcutaneous QHS   insulin aspart  10 Units Subcutaneous TID WC   insulin glargine-yfgn  20 Units Subcutaneous QHS   isosorbide mononitrate  30 mg Oral Daily   lisinopril  5 mg Oral Daily   metoprolol tartrate  25 mg Oral BID   mirtazapine  30 mg Oral QHS   pantoprazole  40 mg Oral Daily   pregabalin  100 mg Oral BID   sodium chloride flush  3 mL Intravenous Q12H   Vilazodone HCl  40 mg Oral Daily   Continuous Infusions:  heparin 1,400 Units/hr (01/08/23 0602)   iron sucrose     PRN Meds: acetaminophen **OR** acetaminophen, oxyCODONE-acetaminophen **AND** oxyCODONE, polyethylene glycol, tiZANidine   Vital Signs    Vitals:   01/08/23 0300 01/08/23 0424 01/08/23 0731 01/08/23 0800  BP:  130/87 132/82 137/83  Pulse: 80 79 98   Resp:  16 16   Temp:  98.2 F (36.8 C) 99.4 F (37.4 C)   TempSrc:  Oral Oral   SpO2:  100% 92%   Weight:  106.1 kg    Height:        Intake/Output Summary (Last 24 hours) at 01/08/2023 0821 Last data filed at 01/08/2023 0700 Gross per 24 hour  Intake 401.73 ml  Output 1240 ml  Net -838.27 ml      01/08/2023    4:24 AM 01/07/2023    4:25 PM 08/21/2019    8:10 AM  Last 3 Weights  Weight (lbs) 233 lb 14.5 oz 227 lb 6.4 oz 248 lb 0.3 oz  Weight (kg) 106.1 kg 103.148 kg 112.5 kg      Telemetry    CHB with  junctional escape, - ST with 1st degree AV block - some Mobitz 1 - Personally Reviewed  ECG    SR with 1st degree AV block and Mobitz 1  - Personally Reviewed  Physical Exam   GEN: No acute distress.  + sob when up to BR and HR 120s Neck: No JVD sitting up Cardiac: RRR, 3/6 systolic murmur, no rubs, or gallops.  Respiratory: rales in bases to auscultation bilaterally. GI: Soft, nontender, non-distended  MS: 1+ edema on lt leg and none on Rt support stockings in place, pt notes much improvement in edema; No deformity. Neuro:  Nonfocal  Psych: Normal affect   Labs    High Sensitivity Troponin:   Recent Labs  Lab 01/07/23 1757  TROPONINIHS 11,393*     Chemistry Recent Labs  Lab 01/07/23 1757 01/08/23 0207  NA  --  137  K  --  3.8  CL  --  100  CO2  --  26  GLUCOSE  --  222*  BUN  --  45*  CREATININE  --  1.64*  CALCIUM  --  9.4  MG 2.1  --   PROT  --  7.3  ALBUMIN  --  3.1*  AST  --  53*  ALT  --  31  ALKPHOS  --  96  BILITOT  --  0.8  GFRNONAA  --  44*  ANIONGAP  --  11    Lipids No results for input(s): "CHOL", "TRIG", "HDL", "LABVLDL", "LDLCALC", "CHOLHDL" in the last 168 hours.  Hematology Recent Labs  Lab 01/08/23 0207  WBC 9.3  RBC 4.23  HGB 10.8*  HCT 33.4*  MCV 79.0*  MCH 25.5*  MCHC 32.3  RDW 16.8*  PLT 319   Thyroid No results for input(s): "TSH", "FREET4" in the last 168 hours.  BNPNo results for input(s): "BNP", "PROBNP" in the last 168 hours.  DDimer No results for input(s): "DDIMER" in the last 168 hours.   Radiology    No results found.  Cardiac Studies   Cardiac cath 04/2020 [facility] Significant 3v CAD   Patent LIMA-LAD and SVG-OM. Occluded SVG-Diag and SVG-rPDA. No change  from last cath.   Normal right heart cath filling pressures   Preserved cardiac output.   Moderate aortic valve stenosis with a mean AV Gradient of 32 mmHg and  AVA of 1.2 cm2.   Echo 05/2020  Summary   1. The left ventricle is normal in size with  severely increased wall  thickness.   2. The left ventricular systolic function is normal, LVEF is visually  estimated at > 55%.    3. There is grade II diastolic dysfunction (elevated filling pressure).    4. There is mild mitral valve regurgitation.    5. There is moderate to severe aortic valve stenosis.    6. The left atrium is mildly dilated in size.    7. The right ventricle is normal in size, with normal systolic function.    Left Ventricle    The left ventricle is normal in size with severely increased wall thickness.    The left ventricular systolic function is normal, LVEF is visually estimated  at > 55%.    There is grade II diastolic dysfunction (elevated filling pressure).   Right Ventricle    The right ventricle is normal in size, with normal systolic function.    Left Atrium    The left atrium is mildly dilated in size.   Right Atrium    The right atrium is normal  in size.    Aortic Valve    The aortic valve is trileaflet with severely thickened leaflets with  severely reduced excursion.    There is no significant aortic regurgitation.    There is moderate to severe aortic valve stenosis.    Peak AV transvalvular velocity:  3.2 m/s.    Mean gradient: 21 mmHg.    Doppler velocity index: 0.32.    Estimated aortic valve area (VTI): 1.9 cm2.    Estimated aortic valve area (velocity): 1.8 cm2.    LVOT diameter: 2.7 cm.    LV stroke volume index: Empty.   Pulmonic Valve    The pulmonic valve is poorly visualized, but probably normal.    There is no significant pulmonic regurgitation.    There is no evidence of a significant transvalvular gradient.   Mitral Valve    The mitral valve leaflets are normal with normal leaflet mobility.    There is mild mitral valve regurgitation.   Tricuspid Valve  The tricuspid valve leaflets are normal, with normal leaflet mobility.    There is no significant tricuspid regurgitation.    Pulmonary systolic pressure cannot  be estimated due to insufficient TR jet.   Lower extremity arterial duplex (2019, [facility]): Right Lower Extremity   ABI: 0.75   Inflow: Normal common femoral arterial waveforms and velocities. No  evidence of inflow (aortoiliac) disease.   Outflow: No focal elevation of the peak systolic velocity in the  superficial femoral artery to suggest focal stenosis. However, the  waveforms transition from triphasic to monophasic at the distal  thigh. Monophasic flow throughout the popliteal artery. Focal  elevation of the peak systolic velocity in the distal aspect again  noted consistent with a hemodynamically significant stenosis.   Runoff: Significantly increased peak systolic velocity in the  proximal anterior tibial artery consistent with runoff disease.  Monophasic flow throughout the runoff arteries.   Left Lower Extremity   ABI: 1.0   Inflow: Calcified atherosclerotic plaque in the common femoral  bifurcation extends into the deep femoral artery. There is  significant elevation of the peak systolic velocity in the proximal  deep femoral artery consistent with stenosis. No evidence of SFA or  popliteal stenosis.   Outflow: Normal profunda femoral, superficial femoral and popliteal  arterial waveforms and velocities. No focal elevation of the PSV to  suggest stenosis.   Runoff: Elevated peak systolic velocity in the proximal anterior  tibial artery consistent with runoff disease.   IMPRESSION:  1. Abnormal resting right ankle-brachial index of 0.75 consistent  with at least moderate peripheral arterial disease.  2. Normal resting left ankle-brachial index of 1.0.  3. Imaging findings suggest hemodynamically significant stenosis in  the distal right popliteal and proximal right anterior tibial  arteries. Imaging findings suggest hemodynamically significant  stenosis in the proximal right profunda femoral artery and proximal  right anterior tibial artery.   Patient Profile      77 y.o. male with hx CAD s/p CABG, CHF, mod AS on Echo and cathbradycardia, HTN, HLD, asthma, DM, sleep apnea, anxiety depression gout, neuropathy, PAD, CKD3 GERD presenting with SOB.  Not taking home meds in 3-4 days.  pBNP 13,500 and troponin not hs 15.  + pul edema on CXR.  For last 3-4 years he becomes SOB with CPAP and has to go to recliner for part of the night.   Assessment & Plan    NSTEMI/CAD with CABG and on cath 2021 grafts LIMA-LAD petent, SVG-OM patent, SVG-Diag [occluded], SVG-rPDA [occluded]  --hs troponin 11,393 here,  non hs troponin in [place] 15. --IV heparin, ASA and plavix   --continue home zetia, atorvastatin, tricor --continue home imdur and metoprolol --holding home lisinopril --Echo pending  --cardiac cath when stable from pulmonary standpoint --EKG read as atrial fib but appears CHB no acute ST changes   Acute on chronic HFpEF --diuresing neg 838 since admit here though wt is up  on Lasix 80 BID --keep K+ 4.0 and Mg+ 2.0 (Mg was 2.1 yesterday) --Cr 1.64 -stable   CHB at times, and with activity ST with 1st degree AV block at times Mobitz 1   Mod AS on last cath in 2021 -echo pending  CKD 3a 1.6 in ER  (Cr jan 2024 1.70 to 1.6 is baseline)  DM-2 on insulin/diabetic neuropathy on lyrica  PAD - on plavix --claudication had resolved with PTA stent but may be coming back per pt  HLD on statin, zetia and tricor   Anxiety/depression/DM-2/sleep apnea/GERD/anemia per  IM             For questions or updates, please contact [place] Please consult [email or web address] for contact info under        Signed, Cecilie Kicks, NP  01/08/2023, 8:21 AM

## 2023-01-08 NOTE — Plan of Care (Signed)

## 2023-01-08 NOTE — Assessment & Plan Note (Signed)
Note that when patient first presented to the hospital, BMI slightly greater than 30, consistent with obesity, but following 2 days of diuresis, BMI now under 30 and patient follows in overweight category

## 2023-01-08 NOTE — Assessment & Plan Note (Addendum)
Uncontrolled T2DM with hyperglycemia,   A1c at 9  Patient was placed on sliding scale and pre meal insulin. Continue with basal insulin therapy.   Continue with statin therapy.

## 2023-01-08 NOTE — Progress Notes (Signed)
Echocardiogram 2D Echocardiogram has been performed.  Ronny Flurry 01/08/2023, 4:16 PM

## 2023-01-08 NOTE — Assessment & Plan Note (Addendum)
Echocardiogram with preserved LV systolic function with EF 55%, mild LVH, mid/ distal lateral wall appears hypokinetic. (Poor windows). RV with preserved systolic function, severe aortic stenosis.  Patient was placed on furosemide IV and had one dose of metolazone, negative fluid balance was achieved, with significant improvement in his symptoms.   Continue medical therapy with lisinopril and isosorbide. Possible transition to ARB as outpatient.

## 2023-01-08 NOTE — Progress Notes (Signed)
Hypoglycemic Event  CBG: 62  Treatment: 4 oz juice/soda  Symptoms: Sweaty  Follow-up CBG: Time:1157 CBG Result:90  Possible Reasons for Event: Unknown  Comments/MD notified: Notified Dr. Gevena Barre    Claudie Fisherman Tiombe Tomeo

## 2023-01-08 NOTE — Progress Notes (Addendum)
ANTICOAGULATION CONSULT NOTE   Pharmacy Consult for heparin Indication: chest pain/ACS  Allergies  Allergen Reactions   Cefuroxime Diarrhea and Nausea And Vomiting    Severe   Ibuprofen Anaphylaxis and Shortness Of Breath        Cefpodoxime Rash   Ciprofloxacin Rash   Doxycycline Rash   Sulfamethoxazole-Trimethoprim Rash   Fluocinolone Other (See Comments)    unknown   Probenecid Other (See Comments)    unknown   Allopurinol Rash   Azithromycin Rash   Sulfa Antibiotics Rash    Patient Measurements: Height: '6\' 2"'$  (188 cm) Weight: 106.1 kg (233 lb 14.5 oz) IBW/kg (Calculated) : 82.2 Heparin Dosing Weight: 102.9 kg  Vital Signs: Temp: 98.5 F (36.9 C) (03/04 2008) Temp Source: Oral (03/04 2008) BP: 101/65 (03/04 2008) Pulse Rate: 68 (03/04 2008)  Labs: Recent Labs    01/07/23 1757 01/08/23 0207 01/08/23 1120 01/08/23 1352 01/08/23 1952  HGB  --  10.8*  --   --   --   HCT  --  33.4*  --   --   --   PLT  --  319  --   --   --   HEPARINUNFRC  --  <0.10* <0.10*  --  <0.10*  CREATININE  --  1.64*  --   --   --   TROPONINIHS 11,393*  --  4,319* 4,530*  --      Estimated Creatinine Clearance: 51.3 mL/min (A) (by C-G formula based on SCr of 1.64 mg/dL (H)).   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  Pharmacy consulted to follow heparin drip.   -heparin level undetectable on 1400 units/hr  Heparin level came back undetectable again. Had some IV line issue and was off for a little before new IV line. We will re-bolus and increase rate again.   Goal of Therapy:  Heparin level 0.3-0.7 units/ml Monitor  platelets by anticoagulation protocol: Yes   Plan:  -Heparin bolus 3000 units then increase to 1900 units/hr -Heparin level in AM and daily wth CBC daily  Onnie Boer, PharmD, [place], AAHIVP, CPP Infectious Disease Pharmacist 01/08/2023 8:38 PM

## 2023-01-08 NOTE — Inpatient Diabetes Management (Signed)
Inpatient Diabetes Program Recommendations  AACE/ADA: New Consensus Statement on Inpatient Glycemic Control (2015)  Target Ranges:  Prepandial:   less than 140 mg/dL      Peak postprandial:   less than 180 mg/dL (1-2 hours)      Critically ill patients:  140 - 180 mg/dL   Lab Results  Component Value Date   GLUCAP 90 01/08/2023   HGBA1C (H) 08/01/2009    7.1 (NOTE) The ADA recommends the following therapeutic goal for glycemic control related to Hgb A1c measurement: Goal of therapy: <6.5 Hgb A1c  Reference: American Diabetes Association: Clinical Practice Recommendations 2010, Diabetes Care, 2010, 33: (Suppl  1).    Review of Glycemic Control  Diabetes history: DM2 Outpatient Diabetes medications: Lantus 22 QHS, Humalog 18 TID, Bydureon 2 mg weekly Current orders for Inpatient glycemic control: Semglee 20 QHS, Novolog 0-15 TID with meals and 0-5 HS + 10 units TID  Hypo of 62 before lunch.  Inpatient Diabetes Program Recommendations:    Consider decreasing Novolog to 8 units TID with meals if eating > 50%.  Continue to follow glucose trends.  Thank you. Lorenda Peck, RD, LDN, [place] Inpatient Diabetes Coordinator [phone]

## 2023-01-08 NOTE — Progress Notes (Signed)
[place] for heparin Indication: chest pain/ACS  Allergies  Allergen Reactions   Cefuroxime Diarrhea and Nausea And Vomiting    Severe   Ibuprofen Anaphylaxis and Shortness Of Breath        Cefpodoxime Rash   Ciprofloxacin Rash   Doxycycline Rash   Sulfamethoxazole-Trimethoprim Rash   Fluocinolone Other (See Comments)    unknown   Probenecid Other (See Comments)    unknown   Allopurinol Rash   Azithromycin Rash   Sulfa Antibiotics Rash    Patient Measurements: Height: '6\' 2"'$  (188 cm) Weight: 106.1 kg (233 lb 14.5 oz) IBW/kg (Calculated) : 82.2 Heparin Dosing Weight: 102.9 kg  Vital Signs: Temp: 99.4 F (37.4 C) (03/04 0731) Temp Source: Oral (03/04 0731) BP: 137/83 (03/04 0800) Pulse Rate: 98 (03/04 0731)  Labs: Recent Labs    01/07/23 1757 01/08/23 0207 01/08/23 1120  HGB  --  10.8*  --   HCT  --  33.4*  --   PLT  --  319  --   HEPARINUNFRC  --  <0.10* <0.10*  CREATININE  --  1.64*  --   TROPONINIHS 11,393*  --   --     Estimated Creatinine Clearance: 51.3 mL/min (A) (by C-G formula based on SCr of 1.64 mg/dL (H)).   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  Pharmacy consulted to follow heparin drip.   -heparin level undetectable on 1400 units/hr  Goal of Therapy:  Heparin level 0.3-0.7 units/ml Monitor platelets by anticoagulation protocol: Yes   Plan:  -Heparin bolus 2000 units then increase to 1700 units/hr -Heparin level in 6 hours and daily wth CBC daily  Hildred Laser, PharmD Clinical Pharmacist **Pharmacist phone directory can now be found on  [email or web address] ([email or web address]).  Listed under [place].

## 2023-01-09 LAB — BASIC METABOLIC PANEL
Anion gap: 10 (ref 5–15)
BUN: 66 mg/dL — ABNORMAL HIGH (ref 8–23)
CO2: 24 mmol/L (ref 22–32)
Calcium: 8.7 mg/dL — ABNORMAL LOW (ref 8.9–10.3)
Chloride: 102 mmol/L (ref 98–111)
Creatinine, Ser: 2.13 mg/dL — ABNORMAL HIGH (ref 0.61–1.24)
GFR, Estimated: 32 mL/min — ABNORMAL LOW (ref >60)
Glucose, Bld: 183 mg/dL — ABNORMAL HIGH (ref 70–99)
Potassium: 3.9 mmol/L (ref 3.5–5.1)
Sodium: 136 mmol/L (ref 135–145)

## 2023-01-09 LAB — CBC
HCT: 31.6 % — ABNORMAL LOW (ref 39.0–52.0)
Hemoglobin: 10 g/dL — ABNORMAL LOW (ref 13.0–17.0)
MCH: 25.2 pg — ABNORMAL LOW (ref 26.0–34.0)
MCHC: 31.6 g/dL (ref 30.0–36.0)
MCV: 79.6 fL — ABNORMAL LOW (ref 80.0–100.0)
Platelets: 307 10*3/uL (ref 150–400)
RBC: 3.97 MIL/uL — ABNORMAL LOW (ref 4.22–5.81)
RDW: 16.8 % — ABNORMAL HIGH (ref 11.5–15.5)
WBC: 7.3 10*3/uL (ref 4.0–10.5)
nRBC: 0 % (ref 0.0–0.2)

## 2023-01-09 LAB — GLUCOSE, CAPILLARY
Glucose-Capillary: 164 mg/dL — ABNORMAL HIGH (ref 70–99)
Glucose-Capillary: 68 mg/dL — ABNORMAL LOW (ref 70–99)
Glucose-Capillary: 251 mg/dL — ABNORMAL HIGH (ref 70–99)
Glucose-Capillary: 196 mg/dL — ABNORMAL HIGH (ref 70–99)

## 2023-01-09 LAB — HEPARIN LEVEL (UNFRACTIONATED)
Heparin Unfractionated: 0.25 IU/mL — ABNORMAL LOW (ref 0.30–0.70)
Heparin Unfractionated: 0.26 IU/mL — ABNORMAL LOW (ref 0.30–0.70)

## 2023-01-09 LAB — HEMOGLOBIN A1C
Hgb A1c MFr Bld: 9 % — ABNORMAL HIGH (ref 4.8–5.6)
Mean Plasma Glucose: 212 mg/dL

## 2023-01-09 MED ORDER — HYDROCORTISONE ACETATE 25 MG RE SUPP
25.0000 mg | Freq: Two times a day (BID) | RECTAL | Status: DC
  Administered 2023-01-09 – 2023-01-12 (×6): 25 mg via RECTAL
  Filled 2023-01-09 (×7): qty 1

## 2023-01-09 MED ORDER — HYDROCORTISONE ACETATE 25 MG RE SUPP
25.0000 mg | Freq: Two times a day (BID) | RECTAL | Status: DC | PRN
  Filled 2023-01-09: qty 1

## 2023-01-09 MED ORDER — METOLAZONE 5 MG PO TABS
2.5000 mg | ORAL_TABLET | Freq: Once | ORAL | Status: AC
  Administered 2023-01-09: 2.5 mg via ORAL
  Filled 2023-01-09: qty 1

## 2023-01-09 MED ORDER — HEPARIN BOLUS VIA INFUSION
2000.0000 [IU] | Freq: Once | INTRAVENOUS | Status: AC
  Administered 2023-01-09: 2000 [IU] via INTRAVENOUS
  Filled 2023-01-09: qty 2000

## 2023-01-09 NOTE — Progress Notes (Signed)
Pt HR 35 at 0147, Checked on Pt , no  complaints of chest pain or SOB, BP 120/86 Map 98, HR 53, O2 96% CPAP. Will continue to monitor

## 2023-01-09 NOTE — Inpatient Diabetes Management (Signed)
Inpatient Diabetes Program Recommendations  AACE/ADA: New Consensus Statement on Inpatient Glycemic Control (2015)  Target Ranges:  Prepandial:   less than 140 mg/dL      Peak postprandial:   less than 180 mg/dL (1-2 hours)      Critically ill patients:  140 - 180 mg/dL   Lab Results  Component Value Date   GLUCAP 68 (L) 01/09/2023   HGBA1C 9.0 (H) 01/08/2023    Review of Glycemic Control  Diabetes history: DM2 Outpatient Diabetes medications: Lantus 22 QHS, Humalog 18 TID, Bydureon 2 mg weekly Current orders for Inpatient glycemic control: Semglee 20 QHS, Novolog 0-15 TID with meals and 0-5 HS + 8 units TID  Hypo 68 before lunch.  Inpatient Diabetes Program Recommendations:    Consider decreasing Novolog to 0-9 units TID with meals and 0-5 HS  Continue to follow.  Thank you. Lorenda Peck, RD, LDN, [place] Inpatient Diabetes Coordinator [phone]

## 2023-01-09 NOTE — Progress Notes (Signed)
Mobility Specialist Progress Note:   01/09/23 1000  Mobility  Activity Ambulated with assistance in hallway  Level of Assistance Standby assist, set-up cues, supervision of patient - no hands on  Assistive Device None  Distance Ambulated (ft) 500 ft  Activity Response Tolerated well  Mobility Referral Yes  $Mobility charge 1 Mobility   Pt eager for mobility session. Required no physical assistance throughout ambulation. SpO2 91% on RA with exertion. Pt back sitting EOB with all needs met.   Nelta Numbers Mobility Specialist Please contact via SecureChat or  Rehab office at [phone]

## 2023-01-09 NOTE — Progress Notes (Signed)
Notified by CCMD Pt HR 37 at 0355, 3rd degree heart block.  Pt asymptomatic, no CP or SOB.  Will continue to monitor.

## 2023-01-09 NOTE — TOC Initial Note (Signed)
Transition of Care [facility]) - Initial/Assessment Note    Patient Details  Name: Dylan Bruce MRN: [id number] Date of Birth: May 13, 1946  [facility] CM/SW Contact:    Bethena Roys, RN Phone Number: 01/09/2023, 11:23 AM  Clinical Narrative:  Patient presented for shortness of breath. Case Manager spoke with patient regarding disposition needs. Patient stated he did not take his meds for four days. Patient states he has a pill box; however, when he looked he hadn't taken any of his meds. Patient reports that is spouse is in the home. Case Manager discussed [place] and the benefit for medication assistance and disease management- patient declined services states he feels that he will be fine. Case Manager did make him aware to consult PCP if needs changed in the future. Patient states he has a CPAP in the home; however, no oxygen support. Case Manager will continue to follow for transition of care needs as he progresses.                  Expected Discharge Plan: Home/Self Care Barriers to Discharge: No Barriers Identified   Patient Goals and CMS Choice Patient states their goals for this hospitalization and ongoing recovery are:: to return home.   Choice offered to / list presented to : NA      Expected Discharge Plan and Services In-house Referral: NA Discharge Planning Services: CM Consult Post Acute Care Choice: NA Living arrangements for the past 2 months: Single Family Home    HH Arranged: Refused HH     Prior Living Arrangements/Services Living arrangements for the past 2 months: Single Family Home Lives with:: Spouse Patient language and need for interpreter reviewed:: Yes Do you feel safe going back to the place where you live?: Yes      Need for Family Participation in Patient Care: Yes (Comment) Care giver support system in place?: Yes (comment) Current home services: DME (cpap at home.) Criminal Activity/Legal Involvement Pertinent to  Current Situation/Hospitalization: No - Comment as needed  Activities of Daily Living Home Assistive Devices/Equipment: None ADL Screening (condition at time of admission) Patient's cognitive ability adequate to safely complete daily activities?: Yes Is the patient deaf or have difficulty hearing?: No Does the patient have difficulty seeing, even when wearing glasses/contacts?: No Does the patient have difficulty concentrating, remembering, or making decisions?: Yes Patient able to express need for assistance with ADLs?: No Does the patient have difficulty dressing or bathing?: No Independently performs ADLs?: Yes (appropriate for developmental age) Does the patient have difficulty walking or climbing stairs?: No Weakness of Legs: None Weakness of Arms/Hands: None  Permission Sought/Granted Permission sought to share information with : Family Supports, Case Manager Permission granted to share information with : Yes, Verbal Permission Granted     Emotional Assessment Appearance:: Appears stated age Attitude/Demeanor/Rapport: Engaged Affect (typically observed): Appropriate Orientation: : Oriented to Self, Oriented to Place, Oriented to  Time Alcohol / Substance Use: Not Applicable Psych Involvement: No (comment)  Admission diagnosis:  NSTEMI (non-ST elevated myocardial infarction) ([place]) [I21.4] Patient Active Problem List   Diagnosis Date Noted   Obesity (BMI 30-39.9) 01/08/2023   NSTEMI (non-ST elevated myocardial infarction) ([place]) 01/07/2023   Acute on chronic diastolic CHF (congestive heart failure) ([place]) 01/07/2023   Diabetic neuropathy ([place]) 01/07/2023   Chronic renal failure, stage 3a ([place]) 01/07/2023   Chronic gout of foot 09/15/2020   Anxiety 06/20/2018   Chest pain 06/17/2018   Anemia, unspecified 01/31/2018   Peripheral  vascular disease of extremity with claudication ([place]) 06/05/2017   Depression 01/14/2014   Essential hypertension 09/10/2013   Chronic back pain  05/07/2013   Bradycardia 08/14/2012   Aortic stenosis 08/14/2012   CAD (coronary artery disease) of artery bypass graft 07/17/2012   DM (diabetes mellitus) ([place]) 09/03/2009   Hyperlipidemia 09/03/2009   Asthma 09/03/2009   GERD 09/03/2009   PSORIASIS 09/03/2009   ARTHRITIS 09/03/2009   SPINA BIFIDA 09/03/2009   Sleep apnea 09/03/2009   PCP:  [name], MD Pharmacy:   [facility], [place], [place] [address] Phone: [phone] Fax: [phone]  [facility] - [place], [place] [address] Phone: [phone] Fax: [phone]  [facility] [place], [place] [address] [place] [address] Phone: [phone] Fax: [phone]   Social Determinants of Health (SDOH) Social History: [place]: No Food Insecurity (01/07/2023)  Housing: Low Risk  (01/07/2023)  Transportation Needs: No Transportation Needs (01/07/2023)  Utilities: Not At Risk (01/07/2023)  Tobacco Use: Low Risk  (01/07/2023)   SDOH Interventions:     Readmission Risk Interventions     No data to display

## 2023-01-09 NOTE — Progress Notes (Addendum)
Rounding Note    Patient Name: Dylan Bruce Date of Encounter: 01/09/2023  [place] Cardiologist: None  new  remote Dr. Stanford Breed  Subjective   Pt remotely seen by Dr. Stanford Breed in 2016 and then [facility] until 2022.  Seen in ER at [facility] 3/3 with NSTEMI.  He has orthopnea. Still cannot comfortably lie flat. Remains SOB, but notes he is improving  Inpatient Medications    Scheduled Meds:  aspirin EC  81 mg Oral Daily   atorvastatin  80 mg Oral Daily   clopidogrel  75 mg Oral Daily   ezetimibe  10 mg Oral Daily   hydrocortisone  25 mg Rectal BID   insulin aspart  0-15 Units Subcutaneous TID WC   insulin aspart  0-5 Units Subcutaneous QHS   insulin aspart  8 Units Subcutaneous TID WC   insulin glargine-yfgn  20 Units Subcutaneous QHS   isosorbide mononitrate  30 mg Oral Daily   lisinopril  5 mg Oral Daily   metoprolol tartrate  25 mg Oral BID   mirtazapine  30 mg Oral QHS   pantoprazole  40 mg Oral Daily   potassium chloride  40 mEq Oral Daily   pregabalin  100 mg Oral BID   sodium chloride flush  3 mL Intravenous Q12H   Vilazodone HCl  40 mg Oral Daily   Continuous Infusions:  furosemide 62 mL/hr at 01/09/23 0800   heparin 2,100 Units/hr (01/09/23 0841)   iron sucrose 200 mg (01/09/23 0908)   PRN Meds: acetaminophen **OR** acetaminophen, oxyCODONE-acetaminophen **AND** oxyCODONE, polyethylene glycol, tiZANidine   Vital Signs    Vitals:   01/08/23 2008 01/09/23 0156 01/09/23 0520 01/09/23 0730  BP: 101/65  124/70 121/65  Pulse: 68  (!) 58 73  Resp: '18 18 18 18  '$ Temp: 98.5 F (36.9 C)  98.1 F (36.7 C) 98.1 F (36.7 C)  TempSrc: Oral  Oral Oral  SpO2: 94% 96% 95% 92%  Weight:   105.9 kg   Height:        Intake/Output Summary (Last 24 hours) at 01/09/2023 0908 Last data filed at 01/09/2023 0800 Gross per 24 hour  Intake 553.55 ml  Output 450 ml  Net 103.55 ml      01/09/2023    5:20 AM 01/08/2023    4:24 AM 01/07/2023    4:25 PM  Last 3  Weights  Weight (lbs) 233 lb 7.5 oz 233 lb 14.5 oz 227 lb 6.4 oz  Weight (kg) 105.9 kg 106.1 kg 103.148 kg      Telemetry    Significant 1st degree AV block. Wenckebach, Looks to have some second degree, Mobitz 2. Does have junctional escape rhythm Personally Reviewed  ECG    No new - Personally Reviewed  Physical Exam   GEN: No acute distress.   Neck: JVD improving Cardiac: RRR, 3/6 systolic murmur, no rubs, or gallops.  Respiratory: mild wob, decreased BS BL improving, no wheezing GI: Soft, nontender, non-distended  MS:no significant pitting edema Neuro:  Nonfocal  Psych: Normal affect   Labs    High Sensitivity Troponin:   Recent Labs  Lab 01/07/23 1757 01/08/23 1120 01/08/23 1352  TROPONINIHS 11,393* 4,319* 4,530*     Chemistry Recent Labs  Lab 01/07/23 1757 01/08/23 0207  NA  --  137  K  --  3.8  CL  --  100  CO2  --  26  GLUCOSE  --  222*  BUN  --  45*  CREATININE  --  1.64*  CALCIUM  --  9.4  MG 2.1  --   PROT  --  7.3  ALBUMIN  --  3.1*  AST  --  53*  ALT  --  31  ALKPHOS  --  96  BILITOT  --  0.8  GFRNONAA  --  44*  ANIONGAP  --  11    Lipids No results for input(s): "CHOL", "TRIG", "HDL", "LABVLDL", "LDLCALC", "CHOLHDL" in the last 168 hours.  Hematology Recent Labs  Lab 01/08/23 0207 01/09/23 0720  WBC 9.3 7.3  RBC 4.23 3.97*  HGB 10.8* 10.0*  HCT 33.4* 31.6*  MCV 79.0* 79.6*  MCH 25.5* 25.2*  MCHC 32.3 31.6  RDW 16.8* 16.8*  PLT 319 307   Thyroid No results for input(s): "TSH", "FREET4" in the last 168 hours.  BNPNo results for input(s): "BNP", "PROBNP" in the last 168 hours.  DDimer No results for input(s): "DDIMER" in the last 168 hours.   Radiology    ECHOCARDIOGRAM COMPLETE  Result Date: 01/08/2023    ECHOCARDIOGRAM REPORT   Patient Name:   Dylan Bruce [facility] Date of Exam: 01/08/2023 Medical Rec #:  [id number]      Height:       74.0 in Accession #:    [id number]     Weight:       233.9 lb Date of Birth:  1946/06/29      BSA:           2.323 m Patient Age:    77 years       BP:           132/82 mmHg Patient Gender: M              HR:           67 bpm. Exam Location:  Inpatient Procedure: 2D Echo, Cardiac Doppler and Color Doppler Indications:    CHF-Acute Systolic [id number]  History:        Patient has no prior history of Echocardiogram examinations.                 CHF, CAD and Previous Myocardial Infarction,                 Arrythmias:Bradycardia, Signs/Symptoms:Chest Pain; Risk                 Factors:Hypertension, Sleep Apnea, Diabetes and Dyslipidemia.                 CKD, stage 3.  Sonographer:    Ronny Flurry Referring Phys: [id number] [place]  1. Difficult acoustic windows limit study Mid/distal lateral wall appears to be hypokinetic.Marland Kitchen Left ventricular ejection fraction, by estimation, is 55%. The left ventricle has normal function. There is mild concentric left ventricular hypertrophy. Left ventricular diastolic parameters are indeterminate.  2. Right ventricular systolic function is normal. The right ventricular size is normal.  3. Left atrial size was mildly dilated.  4. Mild mitral valve regurgitation.  5. AV is thickened, calcified with restricted motion. Poor acoustic windows limit assessment Peak and mean gradients across the valve are 59 and 32 mm Hg respectively . AVA (VTI) is 0.8 cm2. Dimensionless index is 0.18 consistent with severe AS.Marland Kitchen Aortic  valve regurgitation is not visualized.  6. Aortic dilatation noted. There is mild dilatation of the aortic root, measuring 41 mm. There is mild dilatation of the ascending aorta, measuring 41 mm.  7. The inferior vena cava is dilated  in size with <50% respiratory variability, suggesting right atrial pressure of 15 mmHg. FINDINGS  Left Ventricle: Difficult acoustic windows limit study Mid/distal lateral wall appears to be hypokinetic. Left ventricular ejection fraction, by estimation, is 55%. The left ventricle has normal function. The left ventricular internal  cavity size was normal in size. There is mild concentric left ventricular hypertrophy. Left ventricular diastolic parameters are indeterminate. Right Ventricle: The right ventricular size is normal. Right vetricular wall thickness was not assessed. Right ventricular systolic function is normal. Left Atrium: Left atrial size was mildly dilated. Right Atrium: Right atrial size was normal in size. Pericardium: There is no evidence of pericardial effusion. Mitral Valve: There is mild thickening of the mitral valve leaflet(s). Mild mitral annular calcification. Mild mitral valve regurgitation. Tricuspid Valve: The tricuspid valve is normal in structure. Tricuspid valve regurgitation is trivial. Aortic Valve: AV is thickened, calcified with restricted motion. Poor acoustic windows limit assessment Peak and mean gradients across the valve are 59 and 32 mm Hg respectively . AVA (VTI) is 0.8 cm2. Dimensionless index is 0.18 consistent with severe AS. Aortic valve regurgitation is not visualized. Aortic valve mean gradient measures 35.8 mmHg. Aortic valve peak gradient measures 61.1 mmHg. Aortic valve area, by VTI measures 0.65 cm. Pulmonic Valve: The pulmonic valve was normal in structure. Pulmonic valve regurgitation is not visualized. Aorta: Aortic dilatation noted. There is mild dilatation of the aortic root, measuring 41 mm. There is mild dilatation of the ascending aorta, measuring 41 mm. Venous: The inferior vena cava is dilated in size with less than 50% respiratory variability, suggesting right atrial pressure of 15 mmHg. IAS/Shunts: No atrial level shunt detected by color flow Doppler.  LEFT VENTRICLE PLAX 2D LVIDd:         4.60 cm   Diastology LVIDs:         3.00 cm   LV e' medial:    7.72 cm/s LV PW:         1.30 cm   LV E/e' medial:  13.1 LV IVS:        1.20 cm   LV e' lateral:   12.50 cm/s LVOT diam:     2.10 cm   LV E/e' lateral: 8.1 LV SV:         62 LV SV Index:   27 LVOT Area:     3.46 cm  RIGHT VENTRICLE             IVC RV S prime:     8.27 cm/s  IVC diam: 3.00 cm TAPSE (M-mode): 2.1 cm LEFT ATRIUM              Index        RIGHT ATRIUM           Index LA diam:        5.30 cm  2.28 cm/m   RA Area:     23.00 cm LA Vol (A2C):   107.0 ml 46.06 ml/m  RA Volume:   60.30 ml  25.96 ml/m LA Vol (A4C):   58.6 ml  25.23 ml/m LA Biplane Vol: 87.1 ml  37.49 ml/m  AORTIC VALVE AV Area (Vmax):    0.78 cm AV Area (Vmean):   0.71 cm AV Area (VTI):     0.65 cm AV Vmax:           390.75 cm/s AV Vmean:          272.000 cm/s AV VTI:  0.957 m AV Peak Grad:      61.1 mmHg AV Mean Grad:      35.8 mmHg LVOT Vmax:         87.50 cm/s LVOT Vmean:        55.467 cm/s LVOT VTI:          0.179 m LVOT/AV VTI ratio: 0.19  AORTA Ao Root diam: 4.10 cm Ao Asc diam:  4.10 cm MITRAL VALVE MV Area (PHT): 3.99 cm     SHUNTS MV Decel Time: 190 msec     Systemic VTI:  0.18 m MV E velocity: 101.00 cm/s  Systemic Diam: 2.10 cm MV A velocity: 74.70 cm/s MV E/A ratio:  1.35 Dorris Carnes MD Electronically signed by Dorris Carnes MD Signature Date/Time: 01/08/2023/4:52:53 PM    Final     Cardiac Studies   Cardiac cath 04/2020 [facility] Significant 3v CAD   Patent LIMA-LAD and SVG-OM. Occluded SVG-Diag and SVG-rPDA. No change  from last cath.   Normal right heart cath filling pressures   Preserved cardiac output.   Moderate aortic valve stenosis with a mean AV Gradient of 32 mmHg and  AVA of 1.2 cm2.   Echo 05/2020  Summary   1. The left ventricle is normal in size with severely increased wall  thickness.   2. The left ventricular systolic function is normal, LVEF is visually  estimated at > 55%.    3. There is grade II diastolic dysfunction (elevated filling pressure).    4. There is mild mitral valve regurgitation.    5. There is moderate to severe aortic valve stenosis.    6. The left atrium is mildly dilated in size.    7. The right ventricle is normal in size, with normal systolic function.    Left Ventricle    The left  ventricle is normal in size with severely increased wall thickness.    The left ventricular systolic function is normal, LVEF is visually estimated  at > 55%.    There is grade II diastolic dysfunction (elevated filling pressure).   Right Ventricle    The right ventricle is normal in size, with normal systolic function.    Left Atrium    The left atrium is mildly dilated in size.   Right Atrium    The right atrium is normal  in size.    Aortic Valve    The aortic valve is trileaflet with severely thickened leaflets with  severely reduced excursion.    There is no significant aortic regurgitation.    There is moderate to severe aortic valve stenosis.    Peak AV transvalvular velocity:  3.2 m/s.    Mean gradient: 21 mmHg.    Doppler velocity index: 0.32.    Estimated aortic valve area (VTI): 1.9 cm2.    Estimated aortic valve area (velocity): 1.8 cm2.    LVOT diameter: 2.7 cm.    LV stroke volume index: Empty.   Pulmonic Valve    The pulmonic valve is poorly visualized, but probably normal.    There is no significant pulmonic regurgitation.    There is no evidence of a significant transvalvular gradient.   Mitral Valve    The mitral valve leaflets are normal with normal leaflet mobility.    There is mild mitral valve regurgitation.   Tricuspid Valve    The tricuspid valve leaflets are normal, with normal leaflet mobility.    There is no significant tricuspid regurgitation.    Pulmonary systolic pressure  cannot be estimated due to insufficient TR jet.   Lower extremity arterial duplex (2019, [facility]): Right Lower Extremity   ABI: 0.75   Inflow: Normal common femoral arterial waveforms and velocities. No  evidence of inflow (aortoiliac) disease.   Outflow: No focal elevation of the peak systolic velocity in the  superficial femoral artery to suggest focal stenosis. However, the  waveforms transition from triphasic to monophasic at the distal  thigh. Monophasic flow  throughout the popliteal artery. Focal  elevation of the peak systolic velocity in the distal aspect again  noted consistent with a hemodynamically significant stenosis.   Runoff: Significantly increased peak systolic velocity in the  proximal anterior tibial artery consistent with runoff disease.  Monophasic flow throughout the runoff arteries.   Left Lower Extremity   ABI: 1.0   Inflow: Calcified atherosclerotic plaque in the common femoral  bifurcation extends into the deep femoral artery. There is  significant elevation of the peak systolic velocity in the proximal  deep femoral artery consistent with stenosis. No evidence of SFA or  popliteal stenosis.   Outflow: Normal profunda femoral, superficial femoral and popliteal  arterial waveforms and velocities. No focal elevation of the PSV to  suggest stenosis.   Runoff: Elevated peak systolic velocity in the proximal anterior  tibial artery consistent with runoff disease.   IMPRESSION:  1. Abnormal resting right ankle-brachial index of 0.75 consistent  with at least moderate peripheral arterial disease.  2. Normal resting left ankle-brachial index of 1.0.  3. Imaging findings suggest hemodynamically significant stenosis in  the distal right popliteal and proximal right anterior tibial  arteries. Imaging findings suggest hemodynamically significant  stenosis in the proximal right profunda femoral artery and proximal  right anterior tibial artery.   TTE 01/08/2023 1. Difficult acoustic windows limit study Mid/distal lateral wall appears  to be hypokinetic.Marland Kitchen Left ventricular ejection fraction, by estimation, is  55%. The left ventricle has normal function. There is mild concentric left  ventricular hypertrophy. Left  ventricular diastolic parameters are indeterminate.   2. Right ventricular systolic function is normal. The right ventricular  size is normal.   3. Left atrial size was mildly dilated.   4. Mild mitral valve  regurgitation.   5. AV is thickened, calcified with restricted motion. Poor acoustic  windows limit assessment Peak and mean gradients across the valve are 59  and 32 mm Hg respectively . AVA (VTI) is 0.8 cm2. Dimensionless index is  0.18 consistent with severe AS.Marland Kitchen Aortic   valve regurgitation is not visualized.   6. Aortic dilatation noted. There is mild dilatation of the aortic root,  measuring 41 mm. There is mild dilatation of the ascending aorta,  measuring 41 mm.   7. The inferior vena cava is dilated in size with <50% respiratory  variability, suggesting right atrial pressure of 15 mmHg.    Patient Profile     77 y.o. male with hx CAD s/p CABG, CHF, mod AS on Echo and cathbradycardia, HTN, HLD, asthma, DM, sleep apnea, anxiety depression gout, neuropathy, PAD, CKD3 GERD presenting with SOB.  Not taking home meds in 3-4 days.  pBNP 13,500 and troponin not hs 15.  + pul edema on CXR.  For last 3-4 years he becomes SOB with CPAP and has to go to recliner for part of the night; here with NSTEMI and decompensated CHF  Assessment & Plan    NSTEMI/CAD with CABG and on cath 2021 grafts LIMA-LAD petent, SVG-OM patent, SVG-Diag [occluded], SVG-rPDA [occluded] .  Transfer from [place] motion is challenging with the windows. Agree mid-distal lateral/anterolateral wall is hypokinetic. He has known occluded SVG to a diag. Overall has severe native dx. Can evaluate his LIMA and only patent graft SVG-OM. Last case was femoral. Echo shows a persevered EF - stable, plan for LHC/RHC on Thursday - NPO MN on Thursday --hs troponin 11,393 ->4k down trending; asymptomatic --IV heparin, ASA and plavix   --continue home zetia, atorvastatin, tricor --continue home imdur and metoprolol --cont home lisinopril 5 mg daily  Shared Decision Making/Informed Consent The risks [stroke (1 in 1000), death (1 in 1000), kidney failure [usually temporary] (1 in 500), bleeding (1 in 200), allergic reaction  [possibly serious] (1 in 200)], benefits (diagnostic support and management of coronary artery disease) and alternatives of a cardiac catheterization were discussed in detail with Mr. Hodgkin and he is willing to proceed.  Acute on chronic HFpEF - net negative 790; increased lasix to 120 mg BID 3/4, adding metolazone 3/5. 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 Stable rhythm. Avoid AVN blocking agents. Stopped metoprolol  Severe AS : likely contributing to his symptoms - can plan for structural outpatient FU to consider TAVR; will consider inpatient if symptoms don't improve with diuresis  CKD 3a 1.6 in ER  (Cr jan 2024 1.70 to 1.6 is baseline)  DM-2 on insulin/diabetic neuropathy on lyrica  PAD - on plavix --claudication had resolved with PTA stent but may be coming back per pt  HLD on statin, zetia and tricor   OSA: CPAP  Anxiety/depression/DM-2/sleep apnea/GERD/anemia per IM       Time Spent Directly with Patient:  I have spent a total of 35 minutes with the patient reviewing hospital notes, telemetry, EKGs, labs and examining the patient as well as establishing an assessment and plan that was discussed personally with the patient.  > 50% of time was spent in direct patient care.        For questions or updates, please contact [place] Please consult [email or web address] for contact info under        Signed, Janina Mayo, MD  01/09/2023, 9:08 AM

## 2023-01-09 NOTE — Progress Notes (Addendum)
[name]  Patient: Dylan Bruce    [id number]  DOA: 01/07/2023    Date of Service: the patient was seen and examined on 01/09/2023  Brief hospital course: Patient is a 77 year old male with past medical history of CAD status post CABG, obesity, diabetes mellitus, sleep apnea and stage IIIb chronic kidney disease who presented to [facility] on 3/3 with complaints of several days of shortness of breath. (Patient states unexplainably that he had stopped taking his medicines for several days.  He cannot say why.  He did not run out.  He is usually very compliant with his medications.  In the emergency room, patient found to have markedly elevated proBNP of 13,500 and found to be in acute heart failure.  Also noted to have mildly elevated troponins and it was requested patient be transferred to [facility] for evaluation.  Patient was accepted by [facility] and admitted afternoon of 3/3.  Cardiology consulted and patient started on IV Lasix.  Following arrival, repeat blood work done noting high sensitive troponin of 11,000.  Echocardiogram ordered.  Plan is for cardiac cath on Thursday, 3/7, and in the interim, patient diuresed enough to lie flat   Assessment and Plan: * NSTEMI (non-ST elevated myocardial infarction) ([place]) Troponin is trending downward.  Plan for cardiac cath Thursday, 3/7, hoping patient can lie flat with continue diuresis.  Continue heparin infusion  Acute on chronic diastolic CHF (congestive heart failure) (HCC) Echocardiogram notes preserved ejection fraction, indeterminate diastolic function and severe aortic stenosis.  Continue Lasix.  Patient has already diuresed almost 2L  Severe aortic stenosis Patient in the past has been hesitant to consider transcatheter aortic valve replacement.  He is reconsidering this  Chronic kidney disease, stage 3b (HCC) Creatinine increased today following diuresis however, still at  baseline  Essential hypertension Blood pressure is much better with diuresis, continue home medications  Uncontrolled diabetes mellitus with hyperglycemia, with long-term current use of insulin (HCC) CBG still somewhat elevated.  Ingesting scheduled NovoLog with lunch.  Continue sliding scale plus Lantus.  A1c at 9  Sleep apnea Continue CPAP, patient endorses compliance  Overweight (BMI 25.0-29.9) Note that when patient first presented to the hospital, BMI slightly greater than 30, consistent with obesity, but following 2 days of diuresis, BMI now under 30 and patient follows in overweight category       Body mass index is 29.98 kg/m.        Consultants: Cardiology  Procedures: Echocardiogram  Cardiac cath planned  Antimicrobials: None  Code Status: Full code  Able to sleep all night Subjective: Patient feeling better,   Objective: Vital signs were reviewed and unremarkable. Vitals:   01/09/23 0730 01/09/23 1134  BP: 121/65 90/68  Pulse: 73 76  Resp: 18 17  Temp: 98.1 F (36.7 C) 98.2 F (36.8 C)  SpO2: 92% 93%    Intake/Output Summary (Last 24 hours) at 01/09/2023 1209 Last data filed at 01/09/2023 1200 Gross per 24 hour  Intake 553.55 ml  Output 1850 ml  Net -1296.45 ml   Filed Weights   01/07/23 1625 01/08/23 0424 01/09/23 0520  Weight: 103.1 kg 106.1 kg 105.9 kg   Body mass index is 29.98 kg/m.  Exam:  General: Alert and oriented x 3, no acute distress HEENT: Normocephalic atraumatic, mucous membranes are moist Cardiovascular: Regular rate and rhythm, S1-S2, 2 out of 6 systolic ejection murmur Respiratory: Decreased breath sounds bibasilar Abdomen: Soft, nontender, nondistended, positive bowel sounds Musculoskeletal: No clubbing  or cyanosis, 1+ pitting edema bilaterally Skin: No skin breaks, tears or lesions Psychiatry: Appropriate, no evidence of psychoses Neurology: No focal deficits  Data Reviewed: Creatinine at 2.13 with GFR of 32,  stable hemoglobin  Disposition:  Status is: Inpatient Remains inpatient appropriate because:  -Needs cardiac cath -Full diuresis    Anticipated discharge date: 3/8  Family Communication: Will call son DVT Prophylaxis: Heparin infusion    Author: Annita Brod ,MD 01/09/2023 12:09 PM  To reach On-call, see care teams to locate the attending and reach out via [email or web address]. Between 7PM-7AM, please contact night-coverage If you still have difficulty reaching the attending provider, please page the [facility] (Director on Call) for [facility] on amion for assistance.

## 2023-01-09 NOTE — Assessment & Plan Note (Addendum)
AKI hyponatremia   Patient tolerated well diuresis, at the time of his discharge his renal function has a serum cr of 1,59 with K at 4,7 and serum bicarbonate at 23.  Plan to continue diuresis with torsemide.  Increase dose in case of volume overload.

## 2023-01-09 NOTE — Progress Notes (Signed)
ANTICOAGULATION CONSULT NOTE   Pharmacy Consult for heparin Indication: chest pain/ACS  Allergies  Allergen Reactions   Cefuroxime Diarrhea and Nausea And Vomiting    Severe   Ibuprofen Anaphylaxis and Shortness Of Breath        Cefpodoxime Rash   Ciprofloxacin Rash   Doxycycline Rash   Sulfamethoxazole-Trimethoprim Rash   Fluocinolone Other (See Comments)    unknown   Probenecid Other (See Comments)    unknown   Allopurinol Rash   Azithromycin Rash   Sulfa Antibiotics Rash    Patient Measurements: Height: '6\' 2"'$  (188 cm) Weight: 105.9 kg (233 lb 7.5 oz) IBW/kg (Calculated) : 82.2 Heparin Dosing Weight: 102.9 kg  Vital Signs: Temp: 98.1 F (36.7 C) (03/05 0730) Temp Source: Oral (03/05 0730) BP: 121/65 (03/05 0730) Pulse Rate: 73 (03/05 0730)  Labs: Recent Labs    01/07/23 1757 01/08/23 0207 01/08/23 0207 01/08/23 1120 01/08/23 1352 01/08/23 1952 01/09/23 0720  HGB  --  10.8*  --   --   --   --  10.0*  HCT  --  33.4*  --   --   --   --  31.6*  PLT  --  319  --   --   --   --  307  HEPARINUNFRC  --  <0.10*   < > <0.10*  --  <0.10* 0.25*  CREATININE  --  1.64*  --   --   --   --   --   TROPONINIHS 11,393*  --   --  4,319* 4,530*  --   --    < > = values in this interval not displayed.     Estimated Creatinine Clearance: 51.3 mL/min (A) (by C-G formula based on SCr of 1.64 mg/dL (H)).   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  Pharmacy consulted to follow heparin drip.   -heparin level 0.25 on 1900 units/hr  Goal of Therapy:  Heparin level 0.3-0.7 units/ml Monitor platelets by anticoagulation  protocol: Yes   Plan:  -Increase heparin to 2100 units/hr -Heparin level in 8 hours and daily wth CBC daily  Hildred Laser, PharmD Clinical Pharmacist **Pharmacist phone directory can now be found on [email or web address] ([email or web address]).  Listed under [place].

## 2023-01-09 NOTE — Progress Notes (Signed)
ANTICOAGULATION CONSULT NOTE   Pharmacy Consult for heparin Indication: chest pain/ACS  Allergies  Allergen Reactions   Cefuroxime Diarrhea and Nausea And Vomiting    Severe   Ibuprofen Anaphylaxis and Shortness Of Breath        Cefpodoxime Rash   Ciprofloxacin Rash   Doxycycline Rash   Sulfamethoxazole-Trimethoprim Rash   Fluocinolone Other (See Comments)    unknown   Probenecid Other (See Comments)    unknown   Allopurinol Rash   Azithromycin Rash   Sulfa Antibiotics Rash    Patient Measurements: Height: '6\' 2"'$  (188 cm) Weight: 105.9 kg (233 lb 7.5 oz) IBW/kg (Calculated) : 82.2 Heparin Dosing Weight: 102.9 kg  Vital Signs: Temp: 98.2 F (36.8 C) (03/05 1134) Temp Source: Oral (03/05 1134) BP: 91/64 (03/05 1400) Pulse Rate: 76 (03/05 1134)  Labs: Recent Labs    01/07/23 1757 01/08/23 0207 01/08/23 0207 01/08/23 1120 01/08/23 1352 01/08/23 1952 01/09/23 0720 01/09/23 1643  HGB  --  10.8*  --   --   --   --  10.0*  --   HCT  --  33.4*  --   --   --   --  31.6*  --   PLT  --  319  --   --   --   --  307  --   HEPARINUNFRC  --  <0.10*   < > <0.10*  --  <0.10* 0.25* 0.26*  CREATININE  --  1.64*  --   --   --   --  2.13*  --   TROPONINIHS 11,393*  --   --  4,319* 4,530*  --   --   --    < > = values in this interval not displayed.     Estimated Creatinine Clearance: 39.5 mL/min (A) (by C-G formula based on SCr of 2.13 mg/dL (H)).   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  Pharmacy consulted to follow heparin drip.   -heparin level 0.25 on 1900 units/hr  Heparin level still came back  subtherapeutic. We will give a small bolus and increase rate again.   Goal of Therapy:  Heparin level 0.3-0.7 units/ml Monitor platelets by anticoagulation protocol: Yes   Plan:  Heparin bolus 2000 units x1 Increase heparin to 2300 units/hr Heparin level in 8 hours and daily wth CBC daily  Onnie Boer, PharmD, [place], AAHIVP, CPP Infectious Disease Pharmacist 01/09/2023 5:31 PM

## 2023-01-09 NOTE — Assessment & Plan Note (Signed)
Patient in the past has been hesitant to consider transcatheter aortic valve replacement.  He is reconsidering this

## 2023-01-10 DIAGNOSIS — E1165 Type 2 diabetes mellitus with hyperglycemia: Secondary | ICD-10-CM

## 2023-01-10 DIAGNOSIS — N1832 Chronic kidney disease, stage 3b: Secondary | ICD-10-CM

## 2023-01-10 LAB — CBC
HCT: 34.9 % — ABNORMAL LOW (ref 39.0–52.0)
Hemoglobin: 10.5 g/dL — ABNORMAL LOW (ref 13.0–17.0)
MCH: 24.6 pg — ABNORMAL LOW (ref 26.0–34.0)
MCHC: 30.1 g/dL (ref 30.0–36.0)
MCV: 81.7 fL (ref 80.0–100.0)
Platelets: 346 10*3/uL (ref 150–400)
RBC: 4.27 MIL/uL (ref 4.22–5.81)
RDW: 16.6 % — ABNORMAL HIGH (ref 11.5–15.5)
WBC: 8 10*3/uL (ref 4.0–10.5)
nRBC: 0 % (ref 0.0–0.2)

## 2023-01-10 LAB — GLUCOSE, CAPILLARY
Glucose-Capillary: 188 mg/dL — ABNORMAL HIGH (ref 70–99)
Glucose-Capillary: 220 mg/dL — ABNORMAL HIGH (ref 70–99)
Glucose-Capillary: 162 mg/dL — ABNORMAL HIGH (ref 70–99)
Glucose-Capillary: 278 mg/dL — ABNORMAL HIGH (ref 70–99)

## 2023-01-10 LAB — BASIC METABOLIC PANEL
Anion gap: 6 (ref 5–15)
BUN: 72 mg/dL — ABNORMAL HIGH (ref 8–23)
CO2: 28 mmol/L (ref 22–32)
Calcium: 9.1 mg/dL (ref 8.9–10.3)
Chloride: 103 mmol/L (ref 98–111)
Creatinine, Ser: 2.09 mg/dL — ABNORMAL HIGH (ref 0.61–1.24)
GFR, Estimated: 33 mL/min — ABNORMAL LOW (ref >60)
Glucose, Bld: 188 mg/dL — ABNORMAL HIGH (ref 70–99)
Potassium: 4.9 mmol/L (ref 3.5–5.1)
Sodium: 137 mmol/L (ref 135–145)

## 2023-01-10 LAB — HEPARIN LEVEL (UNFRACTIONATED)
Heparin Unfractionated: 0.52 IU/mL (ref 0.30–0.70)
Heparin Unfractionated: 0.32 IU/mL (ref 0.30–0.70)

## 2023-01-10 LAB — LIPID PANEL
Cholesterol: 91 mg/dL (ref 0–200)
HDL: 24 mg/dL — ABNORMAL LOW (ref >40)
LDL Cholesterol: 37 mg/dL (ref 0–99)
Total CHOL/HDL Ratio: 3.8 RATIO
Triglycerides: 149 mg/dL (ref <150)
VLDL: 30 mg/dL (ref 0–40)

## 2023-01-10 MED ORDER — METOLAZONE 5 MG PO TABS
2.5000 mg | ORAL_TABLET | Freq: Once | ORAL | Status: AC
  Administered 2023-01-10: 2.5 mg via ORAL
  Filled 2023-01-10: qty 1

## 2023-01-10 MED ORDER — INSULIN GLARGINE-YFGN 100 UNIT/ML ~~LOC~~ SOLN
10.0000 [IU] | Freq: Every day | SUBCUTANEOUS | Status: DC
  Administered 2023-01-10 – 2023-01-11 (×2): 10 [IU] via SUBCUTANEOUS
  Filled 2023-01-10 (×3): qty 0.1

## 2023-01-10 NOTE — Progress Notes (Signed)
ANTICOAGULATION CONSULT NOTE - Follow Up Consult  Pharmacy Consult for heparin Indication:  NSTEMI  Labs: Recent Labs    01/07/23 1757 01/08/23 0207 01/08/23 0207 01/08/23 1120 01/08/23 1352 01/08/23 1952 01/09/23 0720 01/09/23 1643 01/10/23 0216  HGB  --  10.8*   < >  --   --   --  10.0*  --  10.5*  HCT  --  33.4*  --   --   --   --  31.6*  --  34.9*  PLT  --  319  --   --   --   --  307  --  346  HEPARINUNFRC  --  <0.10*   < > <0.10*  --    < > 0.25* 0.26* 0.52  CREATININE  --  1.64*  --   --   --   --  2.13*  --  2.09*  TROPONINIHS 11,393*  --   --  4,319* 4,530*  --   --   --   --    < > = values in this interval not displayed.    Assessment/Plan:  77yo male therapeutic on heparin after rate changes. Will continue infusion at current rate of 2300 units/hr and confirm stable with additional level.   Wynona Neat, PharmD, BCPS  01/10/2023,3:41 AM

## 2023-01-10 NOTE — Progress Notes (Addendum)
Progress Note   Patient: Dylan Bruce [id number] DOB: [birth year] DOA: 01/07/2023     3 DOS: the patient was seen and examined on 01/10/2023   Brief hospital course: Patient is a 77 year old male with past medical history of CAD status post CABG, obesity, diabetes mellitus, sleep apnea and stage IIIb chronic kidney disease who presented to [facility] on 3/3 with complaints of several days of shortness of breath. (Patient states unexplainably that he had stopped taking his medicines for several days.  He cannot say why.  He did not run out.  He is usually very compliant with his medications.  In the emergency room, patient found to have markedly elevated proBNP of 13,500 and found to be in acute heart failure.  Also noted to have mildly elevated troponins and it was requested patient be transferred to [facility] for evaluation.  Patient was accepted by [facility] hospitalists and admitted afternoon of 3/3.  Cardiology consulted and patient started on IV Lasix.  Following arrival, repeat blood work done noting high sensitive troponin of 11,000.  Echocardiogram ordered.  Plan is for cardiac cath on Thursday, 3/7, and in the interim, patient diuresed enough to lie flat  Assessment and Plan: * NSTEMI (non-ST elevated myocardial infarction) [facility]) Patient with no chest pain. Continue medical therapy with IV heparin.  Aspirin, clopidogrel and statin.   Acute on chronic diastolic CHF (congestive heart failure) (HCC) Echocardiogram with preserved LV systolic function with EF 55%, mild LVH, mid/ distal lateral wall appears hypokinetic. (Poor windows). RV with preserved systolic function, severe aortic stenosis.  Urine output is 0000000 ml Systolic blood pressure 95 to 121 mmHg.   Plan to continue diuresis with furosemide 120 mg IV bid One dose of metolazone today.   Chronic kidney disease, stage 3b ([place]) Renal function with serum ct at 2.0 with K at 4,9 and serum bicarbonate at 28. Na  137,  Plan to continue diuresis with furosemide, had one dose of metolazone today. Follow up renal panel in am, avoid hypotension or nephrotoxic medications.   Essential hypertension Continue blood pressure monitoring.   Type 2 diabetes mellitus with hyperlipidemia (HCC) Uncontrolled T2DM with hyperglycemia,   A1c at 9  Fasting glucose today is 188   Continue sliding scale and pre meal insulin. Will decreased basal insulin to prevent hypoglycemia.   Sleep apnea Continue CPAP, patient endorses compliance  Hyperlipidemia Continue with statin therapy.   Overweight (BMI 25.0-29.9) Note that when patient first presented to the hospital, BMI slightly greater than 30, consistent with obesity, but following 2 days of diuresis, BMI now under 30 and patient follows in overweight category        Subjective: feeling better., today with no dyspnea or edema, free of orthopnea for the last 2 days.   Physical Exam: Vitals:   01/09/23 1822 01/09/23 2021 01/10/23 0339 01/10/23 0826  BP: 122/79 120/70 135/89 121/83  Pulse:  72  61  Resp:  '16 20 16  '$ Temp:  98.1 F (36.7 C) 98.1 F (36.7 C) 99 F (37.2 C)  TempSrc:  Oral Oral Oral  SpO2: 98% 98% 100% 95%  Weight:      Height:       Neurology awake and alert ENT with mild pallor Cardiovascular with S1 and S2 present and rhythmic with no gallops, rubs or murmurs No JVD No lower extremity edema compression socks in place Respiratory with scattered rales with no wheezing Abdomen with no distention  Data Reviewed:    Family  Communication: I spoke with patient's son at the bedside, we talked in detail about patient's condition, plan of care and prognosis and all questions were addressed.   Disposition: Status is: Inpatient Remains inpatient appropriate because: pending cardiac catheterization   Planned Discharge Destination: Home      Author: Tawni Millers, MD 01/10/2023 1:32 PM  For on call review  [email or web address].

## 2023-01-10 NOTE — Care Management Important Message (Signed)
Important Message  Patient Details  Name: JOSEJAVIER PROPHETE MRN: [id number] Date of Birth: 06-29-1946   Medicare Important Message Given:  Yes     Shelda Altes 01/10/2023, 8:31 AM

## 2023-01-10 NOTE — Assessment & Plan Note (Signed)
Continue with statin therapy.  ?

## 2023-01-10 NOTE — Progress Notes (Addendum)
Rounding Note    Patient Name: Dylan Bruce Date of Encounter: 01/10/2023  [place] Cardiologist: None  new  remote Dr. Stanford Breed  Subjective   Pt remotely seen by Dr. Stanford Breed in 2016 and then [facility] until 2022.  Seen in ER at [facility] 3/3 with NSTEMI.  His orthopnea/PND has improved. He slept well last night. Much better response to lasix IV 120 mg BID and 2.5 mg of metolazone. Net negative ~1.7L. Crt stable 2s. No new weight, 233 on admission  Inpatient Medications    Scheduled Meds:  aspirin EC  81 mg Oral Daily   atorvastatin  80 mg Oral Daily   clopidogrel  75 mg Oral Daily   ezetimibe  10 mg Oral Daily   hydrocortisone  25 mg Rectal BID   insulin aspart  0-15 Units Subcutaneous TID WC   insulin aspart  0-5 Units Subcutaneous QHS   insulin aspart  8 Units Subcutaneous TID WC   insulin glargine-yfgn  20 Units Subcutaneous QHS   isosorbide mononitrate  30 mg Oral Daily   lisinopril  5 mg Oral Daily   mirtazapine  30 mg Oral QHS   pantoprazole  40 mg Oral Daily   potassium chloride  40 mEq Oral Daily   pregabalin  100 mg Oral BID   sodium chloride flush  3 mL Intravenous Q12H   Vilazodone HCl  40 mg Oral Daily   Continuous Infusions:  furosemide 120 mg (01/10/23 0906)   heparin 2,300 Units/hr (01/10/23 0513)   iron sucrose 200 mg (01/10/23 0843)   PRN Meds: acetaminophen **OR** acetaminophen, hydrocortisone, oxyCODONE-acetaminophen **AND** oxyCODONE, polyethylene glycol, tiZANidine   Vital Signs    Vitals:   01/09/23 1822 01/09/23 2021 01/10/23 0339 01/10/23 0826  BP: 122/79 120/70 135/89 121/83  Pulse:  72  61  Resp:  '16 20 16  '$ Temp:  98.1 F (36.7 C) 98.1 F (36.7 C) 99 F (37.2 C)  TempSrc:  Oral Oral Oral  SpO2: 98% 98% 100% 95%  Weight:      Height:        Intake/Output Summary (Last 24 hours) at 01/10/2023 0940 Last data filed at 01/10/2023 0513 Gross per 24 hour  Intake 1539.15 ml  Output 3150 ml  Net -1610.85 ml       01/09/2023    5:20 AM 01/08/2023    4:24 AM 01/07/2023    4:25 PM  Last 3 Weights  Weight (lbs) 233 lb 7.5 oz 233 lb 14.5 oz 227 lb 6.4 oz  Weight (kg) 105.9 kg 106.1 kg 103.148 kg      Telemetry    Sinus, wenckebach Personally Reviewed  ECG    No new - Personally Reviewed  Physical Exam   Vitals:   01/10/23 0339 01/10/23 0826  BP: 135/89 121/83  Pulse:  61  Resp: 20 16  Temp: 98.1 F (36.7 C) 99 F (37.2 C)  SpO2: 100% 95%    GEN: No acute distress.   Neck: JVD improving Cardiac: RRR, 3/6 systolic murmur throughout, no rubs, or gallops.  Respiratory:  decreased BS BL improving, no wheezing GI: Soft, nontender, non-distended  MS:no significant pitting edema Neuro:  Nonfocal  Psych: Normal affect   Labs    High Sensitivity Troponin:   Recent Labs  Lab 01/07/23 1757 01/08/23 1120 01/08/23 1352  TROPONINIHS 11,393* 4,319* 4,530*     Chemistry Recent Labs  Lab 01/07/23 1757 01/08/23 0207 01/09/23 0720 01/10/23 0216  NA  --  137 136 137  K  --  3.8 3.9 4.9  CL  --  100 102 103  CO2  --  '26 24 28  '$ GLUCOSE  --  222* 183* 188*  BUN  --  45* 66* 72*  CREATININE  --  1.64* 2.13* 2.09*  CALCIUM  --  9.4 8.7* 9.1  MG 2.1  --   --   --   PROT  --  7.3  --   --   ALBUMIN  --  3.1*  --   --   AST  --  53*  --   --   ALT  --  31  --   --   ALKPHOS  --  96  --   --   BILITOT  --  0.8  --   --   GFRNONAA  --  44* 32* 33*  ANIONGAP  --  '11 10 6    '$ Lipids  Recent Labs  Lab 01/10/23 0216  CHOL 91  TRIG 149  HDL 24*  LDLCALC 37  CHOLHDL 3.8    Hematology Recent Labs  Lab 01/08/23 0207 01/09/23 0720 01/10/23 0216  WBC 9.3 7.3 8.0  RBC 4.23 3.97* 4.27  HGB 10.8* 10.0* 10.5*  HCT 33.4* 31.6* 34.9*  MCV 79.0* 79.6* 81.7  MCH 25.5* 25.2* 24.6*  MCHC 32.3 31.6 30.1  RDW 16.8* 16.8* 16.6*  PLT 319 307 346   Thyroid No results for input(s): "TSH", "FREET4" in the last 168 hours.  BNPNo results for input(s): "BNP", "PROBNP" in the last 168 hours.   DDimer No results for input(s): "DDIMER" in the last 168 hours.   Radiology    ECHOCARDIOGRAM COMPLETE  Result Date: 01/08/2023    ECHOCARDIOGRAM REPORT   Patient Name:   Dylan Bruce [facility] Date of Exam: 01/08/2023 Medical Rec #:  [id number]      Height:       74.0 in Accession #:    [id number]     Weight:       233.9 lb Date of Birth:  Mar 11, 1946      BSA:          2.323 m Patient Age:    77 years       BP:           132/82 mmHg Patient Gender: M              HR:           67 bpm. Exam Location:  Inpatient Procedure: 2D Echo, Cardiac Doppler and Color Doppler Indications:    CHF-Acute Systolic [id number]  History:        Patient has no prior history of Echocardiogram examinations.                 CHF, CAD and Previous Myocardial Infarction,                 Arrythmias:Bradycardia, Signs/Symptoms:Chest Pain; Risk                 Factors:Hypertension, Sleep Apnea, Diabetes and Dyslipidemia.                 CKD, stage 3.  Sonographer:    Ronny Flurry Referring Phys: [id number] [place]  1. Difficult acoustic windows limit study Mid/distal lateral wall appears to be hypokinetic.Marland Kitchen Left ventricular ejection fraction, by estimation, is 55%. The left ventricle has normal function. There is mild concentric left ventricular hypertrophy. Left ventricular diastolic parameters  are indeterminate.  2. Right ventricular systolic function is normal. The right ventricular size is normal.  3. Left atrial size was mildly dilated.  4. Mild mitral valve regurgitation.  5. AV is thickened, calcified with restricted motion. Poor acoustic windows limit assessment Peak and mean gradients across the valve are 59 and 32 mm Hg respectively . AVA (VTI) is 0.8 cm2. Dimensionless index is 0.18 consistent with severe AS.Marland Kitchen Aortic  valve regurgitation is not visualized.  6. Aortic dilatation noted. There is mild dilatation of the aortic root, measuring 41 mm. There is mild dilatation of the ascending aorta, measuring 41 mm.  7. The  inferior vena cava is dilated in size with <50% respiratory variability, suggesting right atrial pressure of 15 mmHg. FINDINGS  Left Ventricle: Difficult acoustic windows limit study Mid/distal lateral wall appears to be hypokinetic. Left ventricular ejection fraction, by estimation, is 55%. The left ventricle has normal function. The left ventricular internal cavity size was normal in size. There is mild concentric left ventricular hypertrophy. Left ventricular diastolic parameters are indeterminate. Right Ventricle: The right ventricular size is normal. Right vetricular wall thickness was not assessed. Right ventricular systolic function is normal. Left Atrium: Left atrial size was mildly dilated. Right Atrium: Right atrial size was normal in size. Pericardium: There is no evidence of pericardial effusion. Mitral Valve: There is mild thickening of the mitral valve leaflet(s). Mild mitral annular calcification. Mild mitral valve regurgitation. Tricuspid Valve: The tricuspid valve is normal in structure. Tricuspid valve regurgitation is trivial. Aortic Valve: AV is thickened, calcified with restricted motion. Poor acoustic windows limit assessment Peak and mean gradients across the valve are 59 and 32 mm Hg respectively . AVA (VTI) is 0.8 cm2. Dimensionless index is 0.18 consistent with severe AS. Aortic valve regurgitation is not visualized. Aortic valve mean gradient measures 35.8 mmHg. Aortic valve peak gradient measures 61.1 mmHg. Aortic valve area, by VTI measures 0.65 cm. Pulmonic Valve: The pulmonic valve was normal in structure. Pulmonic valve regurgitation is not visualized. Aorta: Aortic dilatation noted. There is mild dilatation of the aortic root, measuring 41 mm. There is mild dilatation of the ascending aorta, measuring 41 mm. Venous: The inferior vena cava is dilated in size with less than 50% respiratory variability, suggesting right atrial pressure of 15 mmHg. IAS/Shunts: No atrial level shunt  detected by color flow Doppler.  LEFT VENTRICLE PLAX 2D LVIDd:         4.60 cm   Diastology LVIDs:         3.00 cm   LV e' medial:    7.72 cm/s LV PW:         1.30 cm   LV E/e' medial:  13.1 LV IVS:        1.20 cm   LV e' lateral:   12.50 cm/s LVOT diam:     2.10 cm   LV E/e' lateral: 8.1 LV SV:         62 LV SV Index:   27 LVOT Area:     3.46 cm  RIGHT VENTRICLE            IVC RV S prime:     8.27 cm/s  IVC diam: 3.00 cm TAPSE (M-mode): 2.1 cm LEFT ATRIUM              Index        RIGHT ATRIUM           Index LA diam:  5.30 cm  2.28 cm/m   RA Area:     23.00 cm LA Vol (A2C):   107.0 ml 46.06 ml/m  RA Volume:   60.30 ml  25.96 ml/m LA Vol (A4C):   58.6 ml  25.23 ml/m LA Biplane Vol: 87.1 ml  37.49 ml/m  AORTIC VALVE AV Area (Vmax):    0.78 cm AV Area (Vmean):   0.71 cm AV Area (VTI):     0.65 cm AV Vmax:           390.75 cm/s AV Vmean:          272.000 cm/s AV VTI:            0.957 m AV Peak Grad:      61.1 mmHg AV Mean Grad:      35.8 mmHg LVOT Vmax:         87.50 cm/s LVOT Vmean:        55.467 cm/s LVOT VTI:          0.179 m LVOT/AV VTI ratio: 0.19  AORTA Ao Root diam: 4.10 cm Ao Asc diam:  4.10 cm MITRAL VALVE MV Area (PHT): 3.99 cm     SHUNTS MV Decel Time: 190 msec     Systemic VTI:  0.18 m MV E velocity: 101.00 cm/s  Systemic Diam: 2.10 cm MV A velocity: 74.70 cm/s MV E/A ratio:  1.35 Dorris Carnes MD Electronically signed by Dorris Carnes MD Signature Date/Time: 01/08/2023/4:52:53 PM    Final     Cardiac Studies   Cardiac cath 04/2020 [facility] Significant 3v CAD   Patent LIMA-LAD and SVG-OM. Occluded SVG-Diag and SVG-rPDA. No change  from last cath.   Normal right heart cath filling pressures   Preserved cardiac output.   Moderate aortic valve stenosis with a mean AV Gradient of 32 mmHg and  AVA of 1.2 cm2.   Echo 05/2020  Summary   1. The left ventricle is normal in size with severely increased wall  thickness.   2. The left ventricular systolic function is normal, LVEF is visually   estimated at > 55%.    3. There is grade II diastolic dysfunction (elevated filling pressure).    4. There is mild mitral valve regurgitation.    5. There is moderate to severe aortic valve stenosis.    6. The left atrium is mildly dilated in size.    7. The right ventricle is normal in size, with normal systolic function.    Left Ventricle    The left ventricle is normal in size with severely increased wall thickness.    The left ventricular systolic function is normal, LVEF is visually estimated  at > 55%.    There is grade II diastolic dysfunction (elevated filling pressure).   Right Ventricle    The right ventricle is normal in size, with normal systolic function.    Left Atrium    The left atrium is mildly dilated in size.   Right Atrium    The right atrium is normal  in size.    Aortic Valve    The aortic valve is trileaflet with severely thickened leaflets with  severely reduced excursion.    There is no significant aortic regurgitation.    There is moderate to severe aortic valve stenosis.    Peak AV transvalvular velocity:  3.2 m/s.    Mean gradient: 21 mmHg.    Doppler velocity index: 0.32.    Estimated aortic valve area (VTI): 1.9 cm2.  Estimated aortic valve area (velocity): 1.8 cm2.    LVOT diameter: 2.7 cm.    LV stroke volume index: Empty.   Pulmonic Valve    The pulmonic valve is poorly visualized, but probably normal.    There is no significant pulmonic regurgitation.    There is no evidence of a significant transvalvular gradient.   Mitral Valve    The mitral valve leaflets are normal with normal leaflet mobility.    There is mild mitral valve regurgitation.   Tricuspid Valve    The tricuspid valve leaflets are normal, with normal leaflet mobility.    There is no significant tricuspid regurgitation.    Pulmonary systolic pressure cannot be estimated due to insufficient TR jet.   Lower extremity arterial duplex (2019, [facility]): Right Lower  Extremity   ABI: 0.75   Inflow: Normal common femoral arterial waveforms and velocities. No  evidence of inflow (aortoiliac) disease.   Outflow: No focal elevation of the peak systolic velocity in the  superficial femoral artery to suggest focal stenosis. However, the  waveforms transition from triphasic to monophasic at the distal  thigh. Monophasic flow throughout the popliteal artery. Focal  elevation of the peak systolic velocity in the distal aspect again  noted consistent with a hemodynamically significant stenosis.   Runoff: Significantly increased peak systolic velocity in the  proximal anterior tibial artery consistent with runoff disease.  Monophasic flow throughout the runoff arteries.   Left Lower Extremity   ABI: 1.0   Inflow: Calcified atherosclerotic plaque in the common femoral  bifurcation extends into the deep femoral artery. There is  significant elevation of the peak systolic velocity in the proximal  deep femoral artery consistent with stenosis. No evidence of SFA or  popliteal stenosis.   Outflow: Normal profunda femoral, superficial femoral and popliteal  arterial waveforms and velocities. No focal elevation of the PSV to  suggest stenosis.   Runoff: Elevated peak systolic velocity in the proximal anterior  tibial artery consistent with runoff disease.   IMPRESSION:  1. Abnormal resting right ankle-brachial index of 0.75 consistent  with at least moderate peripheral arterial disease.  2. Normal resting left ankle-brachial index of 1.0.  3. Imaging findings suggest hemodynamically significant stenosis in  the distal right popliteal and proximal right anterior tibial  arteries. Imaging findings suggest hemodynamically significant  stenosis in the proximal right profunda femoral artery and proximal  right anterior tibial artery.   TTE 01/08/2023 1. Difficult acoustic windows limit study Mid/distal lateral wall appears  to be hypokinetic.Marland Kitchen Left  ventricular ejection fraction, by estimation, is  55%. The left ventricle has normal function. There is mild concentric left  ventricular hypertrophy. Left  ventricular diastolic parameters are indeterminate.   2. Right ventricular systolic function is normal. The right ventricular  size is normal.   3. Left atrial size was mildly dilated.   4. Mild mitral valve regurgitation.   5. AV is thickened, calcified with restricted motion. Poor acoustic  windows limit assessment Peak and mean gradients across the valve are 59  and 32 mm Hg respectively . AVA (VTI) is 0.8 cm2. Dimensionless index is  0.18 consistent with severe AS.Marland Kitchen Aortic   valve regurgitation is not visualized.   6. Aortic dilatation noted. There is mild dilatation of the aortic root,  measuring 41 mm. There is mild dilatation of the ascending aorta,  measuring 41 mm.   7. The inferior vena cava is dilated in size with <50% respiratory  variability, suggesting right atrial  pressure of 15 mmHg.    Patient Profile     77 y.o. male with hx CAD s/p CABG, CHF, mod AS on Echo and cathbradycardia, HTN, HLD, asthma, DM, sleep apnea, anxiety depression gout, neuropathy, PAD, CKD3 GERD presenting with SOB.  Not taking home meds in 3-4 days.  pBNP 13,500 and troponin not hs 15.  + pul edema on CXR.  For last 3-4 years he becomes SOB with CPAP and has to go to recliner for part of the night; here with NSTEMI and decompensated CHF  Assessment & Plan    NSTEMI/CAD with CABG and on cath 2021 grafts LIMA-LAD petent, SVG-OM patent, SVG-Diag [occluded], SVG-rPDA [occluded] . Transfer from [place] motion is challenging with the windows. Agree mid-distal lateral/anterolateral wall is hypokinetic. He has known occluded SVG to a diag. Overall has severe native dx. Can evaluate his LIMA and only patent graft SVG-OM. Last case was femoral. Echo shows a persevered EF - remains asymptomatic - stable, plan for LHC/RHC on Thursday - NPO MN on  Thursday --hs troponin 11,393 ->4k down trending; asymptomatic --IV heparin, ASA and plavix   --continue home zetia, atorvastatin, tricor --continue home imdur and metoprolol --cont home lisinopril 5 mg daily  Shared Decision Making/Informed Consent The risks [stroke (1 in 1000), death (1 in 1000), kidney failure [usually temporary] (1 in 500), bleeding (1 in 200), allergic reaction [possibly serious] (1 in 200)], benefits (diagnostic support and management of coronary artery disease) and alternatives of a cardiac catheterization were discussed in detail with Mr. Steelman and he is willing to proceed.  Acute on chronic HFpEF - net negative 790; increased lasix to 120 mg BID 3/4, adding metolazone 3/5. Will repeat '120mg'$  IV lasix and metolazone with better response. 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 Stable rhythm. Avoid AVN blocking agents. Stopped metoprolol  Severe AS : likely contributing to his symptoms - will for structural outpatient FU to consider TAVR: patient prefers to stay here, will plan for him to see Dr. Burt Knack  CKD 3a 1.6 in ER  (Cr jan 2024 1.70 to 1.6 is baseline)  DM-2 on insulin/diabetic neuropathy on lyrica  PAD - on plavix --claudication had resolved with PTA stent but may be coming back per pt  HLD on statin, zetia and tricor   OSA: CPAP  Anxiety/depression/DM-2/sleep apnea/GERD/anemia per IM    Time Spent Directly with Patient:  I have spent a total of 35 minutes with the patient reviewing hospital notes, telemetry, EKGs, labs and examining the patient as well as establishing an assessment and plan that was discussed personally with the patient.  > 50% of time was spent in direct patient care.   For questions or updates, please contact [place] Please consult [email or web address] for contact info under        Signed, Janina Mayo, MD   01/10/2023, 9:40 AM

## 2023-01-10 NOTE — Progress Notes (Signed)
Mobility Specialist Progress Note:   01/10/23 1550  Mobility  Activity Ambulated with assistance in hallway  Level of Assistance Standby assist, set-up cues, supervision of patient - no hands on  Assistive Device None  Distance Ambulated (ft) 500 ft  Activity Response Tolerated well  Mobility Referral Yes  $Mobility charge 1 Mobility   Pt requested second mobility session today. Required no physical assistance. C/o minor unsteadiness, however not overt LOB noted. Pt left sitting EOB with all needs met.   Nelta Numbers Mobility Specialist Please contact via SecureChat or  Rehab office at [phone]

## 2023-01-10 NOTE — Progress Notes (Addendum)
ANTICOAGULATION CONSULT NOTE   Pharmacy Consult for heparin Indication: chest pain/ACS  Allergies  Allergen Reactions   Cefuroxime Diarrhea and Nausea And Vomiting    Severe   Ibuprofen Anaphylaxis and Shortness Of Breath        Cefpodoxime Rash   Ciprofloxacin Rash   Doxycycline Rash   Sulfamethoxazole-Trimethoprim Rash   Fluocinolone Other (See Comments)    unknown   Probenecid Other (See Comments)    unknown   Allopurinol Rash   Azithromycin Rash   Sulfa Antibiotics Rash    Patient Measurements: Height: '6\' 2"'$  (188 cm) Weight: 105.9 kg (233 lb 7.5 oz) IBW/kg (Calculated) : 82.2 Heparin Dosing Weight: 102.9 kg  Vital Signs: Temp: 99 F (37.2 C) (03/06 0826) Temp Source: Oral (03/06 0826) BP: 121/83 (03/06 0826) Pulse Rate: 61 (03/06 0826)  Labs: Recent Labs    01/07/23 1757 01/08/23 0207 01/08/23 0207 01/08/23 1120 01/08/23 1352 01/08/23 1952 01/09/23 0720 01/09/23 1643 01/10/23 0216 01/10/23 0959  HGB  --  10.8*   < >  --   --   --  10.0*  --  10.5*  --   HCT  --  33.4*  --   --   --   --  31.6*  --  34.9*  --   PLT  --  319  --   --   --   --  307  --  346  --   HEPARINUNFRC  --  <0.10*   < > <0.10*  --    < > 0.25* 0.26* 0.52 0.32  CREATININE  --  1.64*  --   --   --   --  2.13*  --  2.09*  --   TROPONINIHS 11,393*  --   --  4,319* 4,530*  --   --   --   --   --    < > = values in this interval not displayed.     Estimated Creatinine Clearance: 40.2 mL/min (A) (by C-G formula based on SCr of 2.09 mg/dL (H)).   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  Pharmacy consulted to follow heparin  drip.  Plans noted for cath on 3/7 -heparin level 0.32 (trend down) on 1900 units/hr, CBC stable  Goal of Therapy:  Heparin level 0.3-0.7 units/ml Monitor platelets by anticoagulation protocol: Yes   Plan:  -Increase heparin to 2400 units/hr to keep at goal -Daily heparin level and CBC  Hildred Laser, PharmD Clinical Pharmacist **Pharmacist phone directory can now be found on [email or web address] ([email or web address]).  Listed under [place].

## 2023-01-10 NOTE — Progress Notes (Signed)
Mobility Specialist Progress Note:   01/10/23 0900  Mobility  Activity Ambulated with assistance in hallway  Level of Assistance Standby assist, set-up cues, supervision of patient - no hands on  Assistive Device None  Distance Ambulated (ft) 500 ft  Activity Response Tolerated well  Mobility Referral Yes  $Mobility charge 1 Mobility   Pt eager for mobility session. Required no physical assistance, only supervision for safety. Minor SOB noted with exertion. Pt left sitting EOB with all needs met.   Nelta Numbers Mobility Specialist Please contact via SecureChat or  Rehab office at [phone]

## 2023-01-10 NOTE — Progress Notes (Signed)
Pt has home CPAP unit in room. Pt will self administer QHS.

## 2023-01-11 ENCOUNTER — Encounter (HOSPITAL_COMMUNITY)
Admission: RE | Disposition: A | Discharge status: Home / Self Care | Payer: Self-pay | Source: Other Acute Inpatient Hospital | Attending: Internal Medicine

## 2023-01-11 ENCOUNTER — Encounter (HOSPITAL_COMMUNITY): Payer: Self-pay | Admitting: Cardiovascular Disease

## 2023-01-11 DIAGNOSIS — I5023 Acute on chronic systolic (congestive) heart failure: Secondary | ICD-10-CM

## 2023-01-11 DIAGNOSIS — R7989 Other specified abnormal findings of blood chemistry: Secondary | ICD-10-CM

## 2023-01-11 DIAGNOSIS — E663 Overweight: Secondary | ICD-10-CM

## 2023-01-11 HISTORY — PX: RIGHT/LEFT HEART CATH AND CORONARY/GRAFT ANGIOGRAPHY: CATH118267

## 2023-01-11 LAB — BASIC METABOLIC PANEL
Anion gap: 11 (ref 5–15)
Anion gap: 9 (ref 5–15)
BUN: 72 mg/dL — ABNORMAL HIGH (ref 8–23)
BUN: 68 mg/dL — ABNORMAL HIGH (ref 8–23)
CO2: 25 mmol/L (ref 22–32)
CO2: 26 mmol/L (ref 22–32)
Calcium: 9.4 mg/dL (ref 8.9–10.3)
Calcium: 9.3 mg/dL (ref 8.9–10.3)
Chloride: 100 mmol/L (ref 98–111)
Chloride: 98 mmol/L (ref 98–111)
Creatinine, Ser: 1.86 mg/dL — ABNORMAL HIGH (ref 0.61–1.24)
Creatinine, Ser: 1.71 mg/dL — ABNORMAL HIGH (ref 0.61–1.24)
GFR, Estimated: 38 mL/min — ABNORMAL LOW (ref >60)
GFR, Estimated: 41 mL/min — ABNORMAL LOW (ref >60)
Glucose, Bld: 121 mg/dL — ABNORMAL HIGH (ref 70–99)
Glucose, Bld: 167 mg/dL — ABNORMAL HIGH (ref 70–99)
Potassium: 4.9 mmol/L (ref 3.5–5.1)
Potassium: 4.7 mmol/L (ref 3.5–5.1)
Sodium: 136 mmol/L (ref 135–145)
Sodium: 133 mmol/L — ABNORMAL LOW (ref 135–145)

## 2023-01-11 LAB — POCT I-STAT 7, (LYTES, BLD GAS, ICA,H+H)
Acid-base deficit: 2 mmol/L (ref 0.0–2.0)
Bicarbonate: 22.4 mmol/L (ref 20.0–28.0)
Calcium, Ion: 1.14 mmol/L — ABNORMAL LOW (ref 1.15–1.40)
HCT: 32 % — ABNORMAL LOW (ref 39.0–52.0)
Hemoglobin: 10.9 g/dL — ABNORMAL LOW (ref 13.0–17.0)
O2 Saturation: 93 %
Potassium: 4.3 mmol/L (ref 3.5–5.1)
Sodium: 141 mmol/L (ref 135–145)
TCO2: 23 mmol/L (ref 22–32)
pCO2 arterial: 35.4 mmHg (ref 32–48)
pH, Arterial: 7.409 (ref 7.35–7.45)
pO2, Arterial: 66 mmHg — ABNORMAL LOW (ref 83–108)

## 2023-01-11 LAB — CBC
HCT: 37.3 % — ABNORMAL LOW (ref 39.0–52.0)
Hemoglobin: 11.4 g/dL — ABNORMAL LOW (ref 13.0–17.0)
MCH: 24.9 pg — ABNORMAL LOW (ref 26.0–34.0)
MCHC: 30.6 g/dL (ref 30.0–36.0)
MCV: 81.4 fL (ref 80.0–100.0)
Platelets: 385 10*3/uL (ref 150–400)
RBC: 4.58 MIL/uL (ref 4.22–5.81)
RDW: 16.9 % — ABNORMAL HIGH (ref 11.5–15.5)
WBC: 8.8 10*3/uL (ref 4.0–10.5)
nRBC: 0 % (ref 0.0–0.2)

## 2023-01-11 LAB — POCT I-STAT EG7
Acid-Base Excess: 1 mmol/L (ref 0.0–2.0)
Bicarbonate: 26.4 mmol/L (ref 20.0–28.0)
Calcium, Ion: 1.23 mmol/L (ref 1.15–1.40)
HCT: 34 % — ABNORMAL LOW (ref 39.0–52.0)
Hemoglobin: 11.6 g/dL — ABNORMAL LOW (ref 13.0–17.0)
O2 Saturation: 58 %
Potassium: 4.5 mmol/L (ref 3.5–5.1)
Sodium: 138 mmol/L (ref 135–145)
TCO2: 28 mmol/L (ref 22–32)
pCO2, Ven: 45.6 mmHg (ref 44–60)
pH, Ven: 7.371 (ref 7.25–7.43)
pO2, Ven: 31 mmHg — CL (ref 32–45)

## 2023-01-11 LAB — GLUCOSE, CAPILLARY
Glucose-Capillary: 157 mg/dL — ABNORMAL HIGH (ref 70–99)
Glucose-Capillary: 140 mg/dL — ABNORMAL HIGH (ref 70–99)
Glucose-Capillary: 289 mg/dL — ABNORMAL HIGH (ref 70–99)
Glucose-Capillary: 168 mg/dL — ABNORMAL HIGH (ref 70–99)

## 2023-01-11 LAB — MAGNESIUM: Magnesium: 2.3 mg/dL (ref 1.7–2.4)

## 2023-01-11 LAB — HEPARIN LEVEL (UNFRACTIONATED): Heparin Unfractionated: 0.68 IU/mL (ref 0.30–0.70)

## 2023-01-11 SURGERY — RIGHT/LEFT HEART CATH AND CORONARY/GRAFT ANGIOGRAPHY
Anesthesia: LOCAL

## 2023-01-11 MED ORDER — SODIUM CHLORIDE 0.9 % IV SOLN: INTRAVENOUS | Status: AC

## 2023-01-11 MED ORDER — ASPIRIN 81 MG PO CHEW: 81.0000 mg | CHEWABLE_TABLET | ORAL | Status: DC

## 2023-01-11 MED ORDER — HEPARIN (PORCINE) IN NACL 1000-0.9 UT/500ML-% IV SOLN
INTRAVENOUS | Status: DC | PRN
  Administered 2023-01-11 (×2): 500 mL

## 2023-01-11 MED ORDER — MIDAZOLAM HCL 2 MG/2ML IJ SOLN
INTRAMUSCULAR | Status: DC | PRN
  Administered 2023-01-11 (×2): 1 mg via INTRAVENOUS

## 2023-01-11 MED ORDER — TORSEMIDE 20 MG PO TABS
20.0000 mg | ORAL_TABLET | Freq: Every day | ORAL | Status: DC
  Administered 2023-01-11 – 2023-01-12 (×2): 20 mg via ORAL
  Filled 2023-01-11 (×2): qty 1

## 2023-01-11 MED ORDER — LIDOCAINE HCL (PF) 1 % IJ SOLN
INTRAMUSCULAR | Status: AC
  Filled 2023-01-11: qty 30

## 2023-01-11 MED ORDER — ASPIRIN 81 MG PO CHEW
81.0000 mg | CHEWABLE_TABLET | ORAL | Status: AC
  Administered 2023-01-11: 81 mg via ORAL
  Filled 2023-01-11: qty 1

## 2023-01-11 MED ORDER — SODIUM CHLORIDE 0.9 % IV SOLN: 250.0000 mL | INTRAVENOUS | Status: DC | PRN

## 2023-01-11 MED ORDER — HEPARIN SODIUM (PORCINE) 5000 UNIT/ML IJ SOLN
5000.0000 [IU] | Freq: Three times a day (TID) | INTRAMUSCULAR | Status: DC
  Administered 2023-01-12: 5000 [IU] via SUBCUTANEOUS
  Filled 2023-01-11: qty 1

## 2023-01-11 MED ORDER — SODIUM CHLORIDE 0.9 % IV SOLN: INTRAVENOUS | Status: DC

## 2023-01-11 MED ORDER — VERAPAMIL HCL 2.5 MG/ML IV SOLN
INTRAVENOUS | Status: AC
  Filled 2023-01-11: qty 2

## 2023-01-11 MED ORDER — HEPARIN SODIUM (PORCINE) 1000 UNIT/ML IJ SOLN
INTRAMUSCULAR | Status: AC
  Filled 2023-01-11: qty 10

## 2023-01-11 MED ORDER — LABETALOL HCL 5 MG/ML IV SOLN: 10.0000 mg | INTRAVENOUS | Status: AC | PRN

## 2023-01-11 MED ORDER — FENTANYL CITRATE (PF) 100 MCG/2ML IJ SOLN
INTRAMUSCULAR | Status: DC | PRN
  Administered 2023-01-11 (×2): 25 ug via INTRAVENOUS

## 2023-01-11 MED ORDER — SODIUM CHLORIDE 0.9% FLUSH
3.0000 mL | Freq: Two times a day (BID) | INTRAVENOUS | Status: DC
  Administered 2023-01-11 – 2023-01-12 (×2): 3 mL via INTRAVENOUS

## 2023-01-11 MED ORDER — HEPARIN SODIUM (PORCINE) 1000 UNIT/ML IJ SOLN
INTRAMUSCULAR | Status: DC | PRN
  Administered 2023-01-11: 5000 [IU] via INTRAVENOUS

## 2023-01-11 MED ORDER — LIDOCAINE HCL (PF) 1 % IJ SOLN
INTRAMUSCULAR | Status: DC | PRN
  Administered 2023-01-11: 2 mL
  Administered 2023-01-11: 1 mL

## 2023-01-11 MED ORDER — VERAPAMIL HCL 2.5 MG/ML IV SOLN
INTRAVENOUS | Status: DC | PRN
  Administered 2023-01-11: 10 mL via INTRA_ARTERIAL

## 2023-01-11 MED ORDER — SODIUM CHLORIDE 0.9% FLUSH: 3.0000 mL | INTRAVENOUS | Status: DC | PRN

## 2023-01-11 MED ORDER — SODIUM CHLORIDE 0.9% FLUSH: 3.0000 mL | Freq: Two times a day (BID) | INTRAVENOUS | Status: DC

## 2023-01-11 MED ORDER — MIDAZOLAM HCL 2 MG/2ML IJ SOLN
INTRAMUSCULAR | Status: AC
  Filled 2023-01-11: qty 2

## 2023-01-11 MED ORDER — FENTANYL CITRATE (PF) 100 MCG/2ML IJ SOLN
INTRAMUSCULAR | Status: AC
  Filled 2023-01-11: qty 2

## 2023-01-11 MED ORDER — HYDRALAZINE HCL 20 MG/ML IJ SOLN: 10.0000 mg | INTRAMUSCULAR | Status: AC | PRN

## 2023-01-11 MED ORDER — IOHEXOL 350 MG/ML SOLN
INTRAVENOUS | Status: DC | PRN
  Administered 2023-01-11: 60 mL

## 2023-01-11 SURGICAL SUPPLY — 14 items
CATH 5FR JL3.5 JR4 ANG PIG MP (CATHETERS) IMPLANT
CATH BALLN WEDGE 5F 110CM (CATHETERS) IMPLANT
CATH INFINITI 5 FR AL2 (CATHETERS) IMPLANT
DEVICE RAD COMP TR BAND LRG (VASCULAR PRODUCTS) IMPLANT
ELECT DEFIB PAD ADLT CADENCE (PAD) IMPLANT
GLIDESHEATH SLEND SS 6F .021 (SHEATH) IMPLANT
GUIDEWIRE .025 260CM (WIRE) IMPLANT
GUIDEWIRE INQWIRE 1.5J.035X260 (WIRE) IMPLANT
INQWIRE 1.5J .035X260CM (WIRE) ×1
KIT HEART LEFT (KITS) ×1 IMPLANT
PACK CARDIAC CATHETERIZATION (CUSTOM PROCEDURE TRAY) ×1 IMPLANT
SHEATH GLIDE SLENDER 4/5FR (SHEATH) IMPLANT
TRANSDUCER W/STOPCOCK (MISCELLANEOUS) ×1 IMPLANT
TUBING CIL FLEX 10 FLL-RA (TUBING) ×1 IMPLANT

## 2023-01-11 NOTE — Progress Notes (Signed)
Mobility Specialist Progress Note:   01/11/23 0925  Mobility  Activity Ambulated with assistance in hallway  Level of Assistance Standby assist, set-up cues, supervision of patient - no hands on  Assistive Device None  Distance Ambulated (ft) 500 ft  Activity Response Tolerated well  Mobility Referral Yes  $Mobility charge 1 Mobility   Pt eager for mobility session. Required no physical assistance throughout. No c/o throughout session. Pt left sitting EOB with all needs met.  Nelta Numbers Mobility Specialist Please contact via SecureChat or  Rehab office at [phone]

## 2023-01-11 NOTE — H&P (View-Only) (Signed)
Rounding Note    Patient Name: Dylan Bruce Date of Encounter: 01/11/2023  [employer] Cardiologist: Kirk Ruths, MD   Subjective   Questions answered about heart cath, discussed kidney function. He has been taking propel/gatorade packets intermittently for cramping this admission. I was able to lower his HOB almost flat, will need a wedge.  Inpatient Medications    Scheduled Meds:  aspirin EC  81 mg Oral Daily   atorvastatin  80 mg Oral Daily   clopidogrel  75 mg Oral Daily   ezetimibe  10 mg Oral Daily   hydrocortisone  25 mg Rectal BID   insulin aspart  0-15 Units Subcutaneous TID WC   insulin aspart  0-5 Units Subcutaneous QHS   insulin aspart  8 Units Subcutaneous TID WC   insulin glargine-yfgn  10 Units Subcutaneous QHS   isosorbide mononitrate  30 mg Oral Daily   lisinopril  5 mg Oral Daily   mirtazapine  30 mg Oral QHS   pantoprazole  40 mg Oral Daily   potassium chloride  40 mEq Oral Daily   pregabalin  100 mg Oral BID   sodium chloride flush  3 mL Intravenous Q12H   Vilazodone HCl  40 mg Oral Daily   Continuous Infusions:  sodium chloride 10 mL/hr at 01/11/23 0906   sodium chloride     furosemide 120 mg (01/10/23 1709)   heparin 2,400 Units/hr (01/10/23 2219)   iron sucrose 200 mg (01/10/23 0843)   PRN Meds: acetaminophen **OR** acetaminophen, hydrocortisone, oxyCODONE-acetaminophen **AND** oxyCODONE, polyethylene glycol, tiZANidine   Vital Signs    Vitals:   01/11/23 0011 01/11/23 0457 01/11/23 0730 01/11/23 0843  BP: 132/82  132/89 (!) 120/98  Pulse: 79  88   Resp: 18  18   Temp: 98.3 F (36.8 C)  97.6 F (36.4 C)   TempSrc: Oral  Oral   SpO2: 91%  97%   Weight: 101.7 kg 101.7 kg    Height:        Intake/Output Summary (Last 24 hours) at 01/11/2023 0907 Last data filed at 01/11/2023 0700 Gross per 24 hour  Intake --  Output 2000 ml  Net -2000 ml      01/11/2023    4:57 AM 01/11/2023   12:11 AM 01/09/2023    5:20 AM  Last 3  Weights  Weight (lbs) 224 lb 3.3 oz 224 lb 3.3 oz 233 lb 7.5 oz  Weight (kg) 101.7 kg 101.7 kg 105.9 kg      Telemetry    Sinus rhythm with HR 90s - Personally Reviewed  ECG    No new tracings - Personally Reviewed  Physical Exam   GEN: No acute distress.   Neck: No JVD Cardiac: RRR, 4/6 holosystolic murmur Respiratory: Clear to auscultation bilaterally. GI: Soft, nontender, non-distended  MS: No edema; No deformity. Neuro:  Nonfocal  Psych: Normal affect   Labs    High Sensitivity Troponin:   Recent Labs  Lab 01/07/23 1757 01/08/23 1120 01/08/23 1352  TROPONINIHS 11,393* 4,319* 4,530*     Chemistry Recent Labs  Lab 01/07/23 1757 01/08/23 0207 01/08/23 0207 01/09/23 0720 01/10/23 0216 01/11/23 0235  NA  --  137   < > 136 137 136  K  --  3.8   < > 3.9 4.9 4.9  CL  --  100   < > 102 103 100  CO2  --  26   < > '24 28 25  '$ GLUCOSE  --  222*   < > 183* 188* 121*  BUN  --  45*   < > 66* 72* 72*  CREATININE  --  1.64*   < > 2.13* 2.09* 1.86*  CALCIUM  --  9.4   < > 8.7* 9.1 9.4  MG 2.1  --   --   --   --  2.3  PROT  --  7.3  --   --   --   --   ALBUMIN  --  3.1*  --   --   --   --   AST  --  53*  --   --   --   --   ALT  --  31  --   --   --   --   ALKPHOS  --  96  --   --   --   --   BILITOT  --  0.8  --   --   --   --   GFRNONAA  --  44*   < > 32* 33* 38*  ANIONGAP  --  11   < > '10 6 11   '$ < > = values in this interval not displayed.    Lipids  Recent Labs  Lab 01/10/23 0216  CHOL 91  TRIG 149  HDL 24*  LDLCALC 37  CHOLHDL 3.8    Hematology Recent Labs  Lab 01/09/23 0720 01/10/23 0216 01/11/23 0235  WBC 7.3 8.0 8.8  RBC 3.97* 4.27 4.58  HGB 10.0* 10.5* 11.4*  HCT 31.6* 34.9* 37.3*  MCV 79.6* 81.7 81.4  MCH 25.2* 24.6* 24.9*  MCHC 31.6 30.1 30.6  RDW 16.8* 16.6* 16.9*  PLT 307 346 385   Thyroid No results for input(s): "TSH", "FREET4" in the last 168 hours.  BNPNo results for input(s): "BNP", "PROBNP" in the last 168 hours.  DDimer No  results for input(s): "DDIMER" in the last 168 hours.   Radiology    No results found.  Cardiac Studies   Echo 01/08/23: 1. Difficult acoustic windows limit study Mid/distal lateral wall appears  to be hypokinetic.Marland Kitchen Left ventricular ejection fraction, by estimation, is  55%. The left ventricle has normal function. There is mild concentric left  ventricular hypertrophy. Left  ventricular diastolic parameters are indeterminate.   2. Right ventricular systolic function is normal. The right ventricular  size is normal.   3. Left atrial size was mildly dilated.   4. Mild mitral valve regurgitation.   5. AV is thickened, calcified with restricted motion. Poor acoustic  windows limit assessment Peak and mean gradients across the valve are 59  and 32 mm Hg respectively . AVA (VTI) is 0.8 cm2. Dimensionless index is  0.18 consistent with severe AS.Marland Kitchen Aortic   valve regurgitation is not visualized.   6. Aortic dilatation noted. There is mild dilatation of the aortic root,  measuring 41 mm. There is mild dilatation of the ascending aorta,  measuring 41 mm.   7. The inferior vena cava is dilated in size with <50% respiratory  variability, suggesting right atrial pressure of 15 mmHg.   Patient Profile     77 y.o. male with hx CAD s/p CABG, CHF, mod AS on Echo, HTN, HLD, asthma, DM, sleep apnea, anxiety depression gout, neuropathy, PAD, CKD3 GERD presenting with SOB. Not taking home meds in 3-4 days. pBNP 13,500 and troponin not hs 15. + pul edema on CXR. For last 3-4 years he becomes SOB with CPAP and has to go  to recliner for part of the night; here with NSTEMI and decompensated CHF   Assessment & Plan    NSTEMI CAD s/p CABG Last heart cath with 2/4 grafts occluded, patent LIMA-LAD and SVG-OM - troponin down trending from 11393 - no chest pain today - planning for R/L HC   Severe aortic stenosis Continued SOB and orthopnea/PND Suspect he has been adequately diuresed LVEF preserved R/L  HC today to evaluate AS and possible TAVR   A o CKD IIIa-b sCr improving 1.86 from peak 2.13 Will give gentle fluids pre-cath   Hyperlipidemia with LDL goal < 70 01/10/2023: Cholesterol 91; HDL 24; LDL Cholesterol 37; Triglycerides 149; VLDL 30 Lipitor 80 mg   Conduction disease Sinus tachycardia First degree AV block, episodes of second degree mobitz I and II - avoid AV notal agents   PAD Prior intervention, on plavix   OSA on CPAP Compliant       For questions or updates, please contact [place] Please consult [email or web address] for contact info under        Signed, Ledora Bottcher, PA  01/11/2023, 9:07 AM

## 2023-01-11 NOTE — Progress Notes (Signed)
Progress Note   Patient: Dylan Bruce [id number] DOB: 05/24/1946 DOA: 01/07/2023     4 DOS: the patient was seen and examined on 01/11/2023   Brief hospital course: Mr. Giglia was admitted to the hospital with the working diagnosis of NSTEMI.   77 year old male with past medical history of CAD status post CABG, obesity, diabetes mellitus, sleep apnea and stage IIIb chronic kidney disease who presented to [facility] on 3/3 with complaints of several days of shortness of breath. (Patient states unexplainably that he had stopped taking his medicines for several days.  He cannot say why.  He did not run out.  He is usually very compliant with his medications.   In the emergency room, patient found to have markedly elevated proBNP of 13,500 and found to be in acute heart failure.   Also noted to have mildly elevated troponins and it was requested patient be transferred to [facility] for evaluation.   Patient was accepted by [facility] hospitalists and admitted afternoon of 3/3.   At the time of his transfer his blood pressure was 113/83, HR 91, RR 16, lungs with no wheezing but positive rales, heart with S1 and S2 present and regular with no gallops, or rubs, no murmurs, abdomen with no distention, positive lower extremity edema.   Na 137, K 3,8 Cl 100, bicarbonate 26, glucose 222 bun 45 cr 1,64  High sensitive troponin 4,319 Wbc 9,3 hgb 10,8 plt 319   EKG 58 bpm, normal axis, normal qrs and qtc, 2nd degree AV block type 1, with no significant ST segment changes, negative T wave lead I and AvL.   Cardiology consulted and patient started on IV Lasix and continued with IV heparin.   03/07 improved volume status, cardiac catheterization.   Assessment and Plan: * NSTEMI (non-ST elevated myocardial infarction) [facility]) Patient with no chest pain. Continue medical therapy with IV heparin.  Aspirin, clopidogrel and statin.   Cardiac catheterization today, for coronary angiography.    Acute on chronic diastolic CHF (congestive heart failure) (HCC) Echocardiogram with preserved LV systolic function with EF 55%, mild LVH, mid/ distal lateral wall appears hypokinetic. (Poor windows). RV with preserved systolic function, severe aortic stenosis.  Urine output is 123XX123 ml Systolic blood pressure 123456 to 132 mmHg.   Plan to continue diuresis with furosemide 120 mg IV bid 03/06 metolazone.   Continue medical therapy with lisinopril and isosorbide. Possible transition to ARB before his discharge.   Chronic kidney disease, stage 3b ([place]) AKI hyponatremia   Improvement in volume status, renal function with serum cr at 1,71 with K at 4,7 and serum bicarbonate at 26  Na 133.   Plan to continue diuresis with furosemide, had  Follow up renal panel in am, avoid hypotension or nephrotoxic medications.   Essential hypertension Continue blood pressure monitoring.   Type 2 diabetes mellitus with hyperlipidemia (HCC) Uncontrolled T2DM with hyperglycemia,   A1c at 9  Fasting glucose today is 167   Continue sliding scale and pre meal insulin. Continue with basal insulin therapy.   Continue with statin therapy.   Sleep apnea Continue CPAP, patient endorses compliance  Overweight (BMI 25.0-29.9) Note that when patient first presented to the hospital, BMI slightly greater than 30, consistent with obesity, but following 2 days of diuresis, BMI now under 30 and patient follows in overweight category        Subjective: Patient with no chest pain, no dyspnea, PND or orthopnea.   Physical Exam: Vitals:  01/11/23 0011 01/11/23 0457 01/11/23 0730 01/11/23 0843  BP: 132/82  132/89 (!) 120/98  Pulse: 79  88   Resp: 18  18   Temp: 98.3 F (36.8 C)  97.6 F (36.4 C)   TempSrc: Oral  Oral   SpO2: 91%  97%   Weight: 101.7 kg 101.7 kg    Height:       Neurology awake and alert ENT with mild pallor Cardiovascular with S1 and S2 present and rhythmic, positive systolic  murmur at the base with no gallops No JVD No lower extremity edema Respiratory with no rales or wheezing Abdomen with no distention   Data Reviewed:    Family Communication: no family at the bedside   Disposition: Status is: Inpatient Remains inpatient appropriate because: ACS  Planned Discharge Destination: Home  Author: Tawni Millers, MD 01/11/2023 12:35 PM  For on call review [email or web address].

## 2023-01-11 NOTE — Interval H&P Note (Signed)
History and Physical Interval Note:  01/11/2023 12:00 PM  Dylan Bruce  has presented today for surgery, with the diagnosis of severe aortic stenosis.  The various methods of treatment have been discussed with the patient and family. After consideration of risks, benefits and other options for treatment, the patient has consented to  Procedure(s): RIGHT/LEFT HEART CATH AND CORONARY/GRAFT ANGIOGRAPHY (N/A) as a surgical intervention.  The patient's history has been reviewed, patient examined, no change in status, stable for surgery.  I have reviewed the patient's chart and labs.  Questions were answered to the patient's satisfaction.    Cath Lab Visit (complete for each Cath Lab visit)  Clinical Evaluation Leading to the Procedure:   ACS: Yes.    Non-ACS:    Anginal Classification: CCS III  Anti-ischemic medical therapy: Maximal Therapy (2 or more classes of medications)  Non-Invasive Test Results: No non-invasive testing performed  Prior CABG: Previous CABG        Lauree Chandler

## 2023-01-11 NOTE — Progress Notes (Signed)
Received report from Shanon Brow in the cath lab regarding cardiac cath, Report given to [place], RN, covering for [facility] RN who is at lunch.

## 2023-01-11 NOTE — Progress Notes (Signed)
Rounding Note    Patient Name: Dylan Bruce Date of Encounter: 01/11/2023  [place] Cardiologist: Kirk Ruths, MD   Subjective   Questions answered about heart cath, discussed kidney function. He has been taking propel/gatorade packets intermittently for cramping this admission. I was able to lower his HOB almost flat, will need a wedge.  Inpatient Medications    Scheduled Meds:  aspirin EC  81 mg Oral Daily   atorvastatin  80 mg Oral Daily   clopidogrel  75 mg Oral Daily   ezetimibe  10 mg Oral Daily   hydrocortisone  25 mg Rectal BID   insulin aspart  0-15 Units Subcutaneous TID WC   insulin aspart  0-5 Units Subcutaneous QHS   insulin aspart  8 Units Subcutaneous TID WC   insulin glargine-yfgn  10 Units Subcutaneous QHS   isosorbide mononitrate  30 mg Oral Daily   lisinopril  5 mg Oral Daily   mirtazapine  30 mg Oral QHS   pantoprazole  40 mg Oral Daily   potassium chloride  40 mEq Oral Daily   pregabalin  100 mg Oral BID   sodium chloride flush  3 mL Intravenous Q12H   Vilazodone HCl  40 mg Oral Daily   Continuous Infusions:  sodium chloride 10 mL/hr at 01/11/23 0906   sodium chloride     furosemide 120 mg (01/10/23 1709)   heparin 2,400 Units/hr (01/10/23 2219)   iron sucrose 200 mg (01/10/23 0843)   PRN Meds: acetaminophen **OR** acetaminophen, hydrocortisone, oxyCODONE-acetaminophen **AND** oxyCODONE, polyethylene glycol, tiZANidine   Vital Signs    Vitals:   01/11/23 0011 01/11/23 0457 01/11/23 0730 01/11/23 0843  BP: 132/82  132/89 (!) 120/98  Pulse: 79  88   Resp: 18  18   Temp: 98.3 F (36.8 C)  97.6 F (36.4 C)   TempSrc: Oral  Oral   SpO2: 91%  97%   Weight: 101.7 kg 101.7 kg    Height:        Intake/Output Summary (Last 24 hours) at 01/11/2023 0907 Last data filed at 01/11/2023 0700 Gross per 24 hour  Intake --  Output 2000 ml  Net -2000 ml      01/11/2023    4:57 AM 01/11/2023   12:11 AM 01/09/2023    5:20 AM  Last 3  Weights  Weight (lbs) 224 lb 3.3 oz 224 lb 3.3 oz 233 lb 7.5 oz  Weight (kg) 101.7 kg 101.7 kg 105.9 kg      Telemetry    Sinus rhythm with HR 90s - Personally Reviewed  ECG    No new tracings - Personally Reviewed  Physical Exam   GEN: No acute distress.   Neck: No JVD Cardiac: RRR, 4/6 holosystolic murmur Respiratory: Clear to auscultation bilaterally. GI: Soft, nontender, non-distended  MS: No edema; No deformity. Neuro:  Nonfocal  Psych: Normal affect   Labs    High Sensitivity Troponin:   Recent Labs  Lab 01/07/23 1757 01/08/23 1120 01/08/23 1352  TROPONINIHS 11,393* 4,319* 4,530*     Chemistry Recent Labs  Lab 01/07/23 1757 01/08/23 0207 01/08/23 0207 01/09/23 0720 01/10/23 0216 01/11/23 0235  NA  --  137   < > 136 137 136  K  --  3.8   < > 3.9 4.9 4.9  CL  --  100   < > 102 103 100  CO2  --  26   < > '24 28 25  '$ GLUCOSE  --  222*   < > 183* 188* 121*  BUN  --  45*   < > 66* 72* 72*  CREATININE  --  1.64*   < > 2.13* 2.09* 1.86*  CALCIUM  --  9.4   < > 8.7* 9.1 9.4  MG 2.1  --   --   --   --  2.3  PROT  --  7.3  --   --   --   --   ALBUMIN  --  3.1*  --   --   --   --   AST  --  53*  --   --   --   --   ALT  --  31  --   --   --   --   ALKPHOS  --  96  --   --   --   --   BILITOT  --  0.8  --   --   --   --   GFRNONAA  --  44*   < > 32* 33* 38*  ANIONGAP  --  11   < > '10 6 11   '$ < > = values in this interval not displayed.    Lipids  Recent Labs  Lab 01/10/23 0216  CHOL 91  TRIG 149  HDL 24*  LDLCALC 37  CHOLHDL 3.8    Hematology Recent Labs  Lab 01/09/23 0720 01/10/23 0216 01/11/23 0235  WBC 7.3 8.0 8.8  RBC 3.97* 4.27 4.58  HGB 10.0* 10.5* 11.4*  HCT 31.6* 34.9* 37.3*  MCV 79.6* 81.7 81.4  MCH 25.2* 24.6* 24.9*  MCHC 31.6 30.1 30.6  RDW 16.8* 16.6* 16.9*  PLT 307 346 385   Thyroid No results for input(s): "TSH", "FREET4" in the last 168 hours.  BNPNo results for input(s): "BNP", "PROBNP" in the last 168 hours.  DDimer No  results for input(s): "DDIMER" in the last 168 hours.   Radiology    No results found.  Cardiac Studies   Echo 01/08/23: 1. Difficult acoustic windows limit study Mid/distal lateral wall appears  to be hypokinetic.Marland Kitchen Left ventricular ejection fraction, by estimation, is  55%. The left ventricle has normal function. There is mild concentric left  ventricular hypertrophy. Left  ventricular diastolic parameters are indeterminate.   2. Right ventricular systolic function is normal. The right ventricular  size is normal.   3. Left atrial size was mildly dilated.   4. Mild mitral valve regurgitation.   5. AV is thickened, calcified with restricted motion. Poor acoustic  windows limit assessment Peak and mean gradients across the valve are 59  and 32 mm Hg respectively . AVA (VTI) is 0.8 cm2. Dimensionless index is  0.18 consistent with severe AS.Marland Kitchen Aortic   valve regurgitation is not visualized.   6. Aortic dilatation noted. There is mild dilatation of the aortic root,  measuring 41 mm. There is mild dilatation of the ascending aorta,  measuring 41 mm.   7. The inferior vena cava is dilated in size with <50% respiratory  variability, suggesting right atrial pressure of 15 mmHg.   Patient Profile     77 y.o. male with hx CAD s/p CABG, CHF, mod AS on Echo, HTN, HLD, asthma, DM, sleep apnea, anxiety depression gout, neuropathy, PAD, CKD3 GERD presenting with SOB. Not taking home meds in 3-4 days. pBNP 13,500 and troponin not hs 15. + pul edema on CXR. For last 3-4 years he becomes SOB with CPAP and has to go  to recliner for part of the night; here with NSTEMI and decompensated CHF   Assessment & Plan    NSTEMI CAD s/p CABG Last heart cath with 2/4 grafts occluded, patent LIMA-LAD and SVG-OM - troponin down trending from 11393 - no chest pain today - planning for R/L HC   Severe aortic stenosis Continued SOB and orthopnea/PND Suspect he has been adequately diuresed LVEF preserved R/L  HC today to evaluate AS and possible TAVR   A o CKD IIIa-b sCr improving 1.86 from peak 2.13 Will give gentle fluids pre-cath   Hyperlipidemia with LDL goal < 70 01/10/2023: Cholesterol 91; HDL 24; LDL Cholesterol 37; Triglycerides 149; VLDL 30 Lipitor 80 mg   Conduction disease Sinus tachycardia First degree AV block, episodes of second degree mobitz I and II - avoid AV notal agents   PAD Prior intervention, on plavix   OSA on CPAP Compliant       For questions or updates, please contact [place] Please consult [email or web address] for contact info under        Signed, Ledora Bottcher, PA  01/11/2023, 9:07 AM

## 2023-01-11 NOTE — Progress Notes (Signed)
ANTICOAGULATION CONSULT NOTE   Pharmacy Consult for heparin Indication: chest pain/ACS  Allergies  Allergen Reactions   Cefuroxime Diarrhea and Nausea And Vomiting    Severe   Ibuprofen Anaphylaxis and Shortness Of Breath        Cefpodoxime Rash   Ciprofloxacin Rash   Doxycycline Rash   Sulfamethoxazole-Trimethoprim Rash   Fluocinolone Other (See Comments)    unknown   Probenecid Other (See Comments)    unknown   Allopurinol Rash   Azithromycin Rash   Sulfa Antibiotics Rash    Patient Measurements: Height: '6\' 2"'$  (188 cm) Weight: 101.7 kg (224 lb 3.3 oz) IBW/kg (Calculated) : 82.2 Heparin Dosing Weight: 102.9 kg  Vital Signs: Temp: 97.6 F (36.4 C) (03/07 0730) Temp Source: Oral (03/07 0730) BP: 132/89 (03/07 0730) Pulse Rate: 88 (03/07 0730)  Labs: Recent Labs    01/08/23 1120 01/08/23 1352 01/08/23 1952 01/09/23 0720 01/09/23 1643 01/10/23 0216 01/10/23 0959 01/11/23 0235  HGB  --   --    < > 10.0*  --  10.5*  --  11.4*  HCT  --   --   --  31.6*  --  34.9*  --  37.3*  PLT  --   --   --  307  --  346  --  385  HEPARINUNFRC <0.10*  --    < > 0.25*   < > 0.52 0.32 0.68  CREATININE  --   --   --  2.13*  --  2.09*  --  1.86*  TROPONINIHS 4,319* 4,530*  --   --   --   --   --   --    < > = values in this interval not displayed.     Estimated Creatinine Clearance: 44.4 mL/min (A) (by C-G formula based on SCr of 1.86 mg/dL (H)).   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  Pharmacy consulted to follow heparin drip.  Plans noted for cath on 3/7 -heparin level 0.68 on 2400 units/hr, CBC stable  Goal  of Therapy:  Heparin level 0.3-0.7 units/ml Monitor platelets by anticoagulation protocol: Yes   Plan:  -Continue heparin 2400 units/hr -Daily heparin level and CBC  Hildred Laser, PharmD Clinical Pharmacist **Pharmacist phone directory can now be found on [email or web address] ([email or web address]).  Listed under [place].

## 2023-01-12 ENCOUNTER — Telehealth: Payer: Self-pay

## 2023-01-12 ENCOUNTER — Other Ambulatory Visit (HOSPITAL_COMMUNITY): Payer: Self-pay

## 2023-01-12 LAB — BASIC METABOLIC PANEL
Anion gap: 11 (ref 5–15)
BUN: 70 mg/dL — ABNORMAL HIGH (ref 8–23)
CO2: 23 mmol/L (ref 22–32)
Calcium: 9.1 mg/dL (ref 8.9–10.3)
Chloride: 101 mmol/L (ref 98–111)
Creatinine, Ser: 1.59 mg/dL — ABNORMAL HIGH (ref 0.61–1.24)
GFR, Estimated: 45 mL/min — ABNORMAL LOW (ref >60)
Glucose, Bld: 214 mg/dL — ABNORMAL HIGH (ref 70–99)
Potassium: 4.7 mmol/L (ref 3.5–5.1)
Sodium: 135 mmol/L (ref 135–145)

## 2023-01-12 LAB — CBC
HCT: 37.1 % — ABNORMAL LOW (ref 39.0–52.0)
Hemoglobin: 11.6 g/dL — ABNORMAL LOW (ref 13.0–17.0)
MCH: 25.1 pg — ABNORMAL LOW (ref 26.0–34.0)
MCHC: 31.3 g/dL (ref 30.0–36.0)
MCV: 80.3 fL (ref 80.0–100.0)
Platelets: 391 10*3/uL (ref 150–400)
RBC: 4.62 MIL/uL (ref 4.22–5.81)
RDW: 17.1 % — ABNORMAL HIGH (ref 11.5–15.5)
WBC: 7.9 10*3/uL (ref 4.0–10.5)
nRBC: 0 % (ref 0.0–0.2)

## 2023-01-12 LAB — GLUCOSE, CAPILLARY: Glucose-Capillary: 198 mg/dL — ABNORMAL HIGH (ref 70–99)

## 2023-01-12 MED ORDER — ATORVASTATIN CALCIUM 80 MG PO TABS
80.0000 mg | ORAL_TABLET | Freq: Every day | ORAL | 0 refills | Status: AC
  Filled 2023-01-12: qty 30, 30d supply, fill #0

## 2023-01-12 MED ORDER — TORSEMIDE 20 MG PO TABS
20.0000 mg | ORAL_TABLET | Freq: Every day | ORAL | 0 refills | Status: DC
  Filled 2023-01-12: qty 30, 30d supply, fill #0

## 2023-01-12 MED ORDER — LISINOPRIL 5 MG PO TABS
5.0000 mg | ORAL_TABLET | Freq: Every day | ORAL | 0 refills | Status: DC
  Filled 2023-01-12: qty 30, 30d supply, fill #0

## 2023-01-12 MED ORDER — ISOSORBIDE MONONITRATE ER 30 MG PO TB24
30.0000 mg | ORAL_TABLET | Freq: Every day | ORAL | 0 refills | Status: DC
  Filled 2023-01-12: qty 30, 30d supply, fill #0

## 2023-01-12 MED ORDER — LYRICA 100 MG PO CAPS: 100.0000 mg | ORAL_CAPSULE | Freq: Two times a day (BID) | ORAL | 5 refills | Status: DC

## 2023-01-12 NOTE — Progress Notes (Signed)
Discharge instructions reviewed with pt.  Copy of instructions given to pt. Handout on radial site care provided and reviewed with pt.  Scripts filled by [facility] and were delivered to pt's room. Son is on his way to pick up pt.  Pt d/c'd via wheelchair with belongings.            Escorted by staff.

## 2023-01-12 NOTE — Progress Notes (Signed)
Mobility Specialist Progress Note:   01/12/23 0915  Mobility  Activity Ambulated independently in hallway  Level of Assistance Modified independent, requires aide device or extra time  Assistive Device None  Distance Ambulated (ft) 500 ft  Activity Response Tolerated well  Mobility Referral Yes  $Mobility charge 1 Mobility   Pt eager for mobility session. Required no physical assistance throughout. Pt left sitting EOB with all needs met.   Nelta Numbers Mobility Specialist Please contact via SecureChat or  Rehab office at [phone]

## 2023-01-12 NOTE — Plan of Care (Signed)
  Problem: Education: Goal: Knowledge of General Education information will improve Description: Including pain rating scale, medication(s)/side effects and non-pharmacologic comfort measures Outcome: Adequate for Discharge   Problem: Health Behavior/Discharge Planning: Goal: Ability to manage health-related needs will improve Outcome: Adequate for Discharge   Problem: Clinical Measurements: Goal: Ability to maintain clinical measurements within normal limits will improve Outcome: Adequate for Discharge Goal: Will remain free from infection Outcome: Adequate for Discharge Goal: Diagnostic test results will improve Outcome: Adequate for Discharge Goal: Respiratory complications will improve Outcome: Adequate for Discharge Goal: Cardiovascular complication will be avoided Outcome: Adequate for Discharge   Problem: Activity: Goal: Risk for activity intolerance will decrease Outcome: Adequate for Discharge   Problem: Nutrition: Goal: Adequate nutrition will be maintained Outcome: Adequate for Discharge   Problem: Coping: Goal: Level of anxiety will decrease Outcome: Adequate for Discharge   Problem: Elimination: Goal: Will not experience complications related to bowel motility Outcome: Adequate for Discharge Goal: Will not experience complications related to urinary retention Outcome: Adequate for Discharge   Problem: Pain Managment: Goal: General experience of comfort will improve Outcome: Adequate for Discharge   Problem: Safety: Goal: Ability to remain free from injury will improve Outcome: Adequate for Discharge   Problem: Skin Integrity: Goal: Risk for impaired skin integrity will decrease Outcome: Adequate for Discharge   Problem: Education: Goal: Ability to demonstrate management of disease process will improve Outcome: Adequate for Discharge Goal: Ability to verbalize understanding of medication therapies will improve Outcome: Adequate for Discharge Goal:  Individualized Educational Video(s) Outcome: Adequate for Discharge   Problem: Activity: Goal: Capacity to carry out activities will improve Outcome: Adequate for Discharge   Problem: Cardiac: Goal: Ability to achieve and maintain adequate cardiopulmonary perfusion will improve Outcome: Adequate for Discharge   Problem: Education: Goal: Ability to describe self-care measures that may prevent or decrease complications (Diabetes Survival Skills Education) will improve Outcome: Adequate for Discharge Goal: Individualized Educational Video(s) Outcome: Adequate for Discharge   Problem: Coping: Goal: Ability to adjust to condition or change in health will improve Outcome: Adequate for Discharge   Problem: Fluid Volume: Goal: Ability to maintain a balanced intake and output will improve Outcome: Adequate for Discharge   Problem: Health Behavior/Discharge Planning: Goal: Ability to identify and utilize available resources and services will improve Outcome: Adequate for Discharge Goal: Ability to manage health-related needs will improve Outcome: Adequate for Discharge   Problem: Metabolic: Goal: Ability to maintain appropriate glucose levels will improve Outcome: Adequate for Discharge   Problem: Nutritional: Goal: Maintenance of adequate nutrition will improve Outcome: Adequate for Discharge Goal: Progress toward achieving an optimal weight will improve Outcome: Adequate for Discharge   Problem: Skin Integrity: Goal: Risk for impaired skin integrity will decrease Outcome: Adequate for Discharge   Problem: Tissue Perfusion: Goal: Adequacy of tissue perfusion will improve Outcome: Adequate for Discharge   Problem: Education: Goal: Understanding of CV disease, CV risk reduction, and recovery process will improve Outcome: Adequate for Discharge Goal: Individualized Educational Video(s) Outcome: Adequate for Discharge   Problem: Activity: Goal: Ability to return to baseline  activity level will improve Outcome: Adequate for Discharge   Problem: Cardiovascular: Goal: Ability to achieve and maintain adequate cardiovascular perfusion will improve Outcome: Adequate for Discharge Goal: Vascular access site(s) Level 0-1 will be maintained Outcome: Adequate for Discharge   Problem: Health Behavior/Discharge Planning: Goal: Ability to safely manage health-related needs after discharge will improve Outcome: Adequate for Discharge   

## 2023-01-12 NOTE — Discharge Summary (Signed)
Physician Discharge Summary   Patient: Dylan Bruce MRN: [id number] DOB: [birth year]  Admit date:     01/07/2023  Discharge date: 01/12/23  Discharge Physician: Jimmy Picket Glyndon Tursi   PCP: [name], MD   Recommendations at discharge:    Patient will continue aspirin and clopidogrel, resumed statin. Possible transition to ARB as outpatient.  Discontinue metoprolol due to bradycardia.  Diuresis with torsemide 20 mg daily, increase to 40 mg daily in case weight gain 2 to 3 lbs in 24 hrs or 5 lbs in 7 days. Follow up renal function in 7 days as outpatient.  Follow up with Dr Heber Stanton in 7 to 10 days. Follow up with Cardiology as scheduled, continue work up for TAVR as outpatient.    Discharge Diagnoses: Principal Problem:   NSTEMI (non-ST elevated myocardial infarction) ([place]) Active Problems:   Acute on chronic diastolic CHF (congestive heart failure) (HCC)   Essential hypertension   Chronic kidney disease, stage 3b (HCC)   Type 2 diabetes mellitus with hyperlipidemia ([place])   Sleep apnea   Overweight (BMI 25.0-29.9)   Elevated troponin   Acute on chronic systolic heart failure (HCC)  Resolved Problems:   Atherosclerosis of native arteries of extremity with intermittent claudication [facility])  Hospital Course: [name] was admitted to the hospital with the working diagnosis of NSTEMI.   77 year old male with past medical history of CAD status post CABG, obesity, diabetes mellitus, sleep apnea and stage IIIb chronic kidney disease who presented to [facility] on 3/3 with complaints of several days of shortness of breath. (Patient states unexplainably that he had stopped taking his medicines for several days.  He cannot say why.  He did not run out.  He is usually very compliant with his medications.   In the emergency room, patient found to have markedly elevated proBNP of 13,500 and found to be in acute heart failure.   Also noted to have mildly elevated troponins and it  was requested patient be transferred to [facility] for evaluation.   Patient was accepted by [facility] and admitted afternoon of 3/3.   At the time of his transfer his blood pressure was 113/83, HR 91, RR 16, lungs with no wheezing but positive rales, heart with S1 and S2 present and regular with no gallops, or rubs, positive systolic murmur at the base, abdomen with no distention, positive lower extremity edema.   Na 137, K 3,8 Cl 100, bicarbonate 26, glucose 222 bun 45 cr 1,64  High sensitive troponin 4,319 Wbc 9,3 hgb 10,8 plt 319   EKG 58 bpm, normal axis, normal qrs and qtc, 2nd degree AV block type 1, with no significant ST segment changes, negative T wave lead I and AvL.   Cardiology consulted and patient started on IV Lasix and continued with IV heparin.   03/07 improved volume status, cardiac catheterization with chronic and stable CAD. Recommendations to continue medical therapy and follow up as outpatient for TAVR workup.     Assessment and Plan: * NSTEMI (non-ST elevated myocardial infarction) [facility]) Patient was placed on IV heparin for anticoagulation.  Medical therapy with aspirin, clopidogrel and statin.  No further chest pain.   Echocardiogram with preserved LV systolic function EF [id number]. Mid distal lateral wall with hypokinesis.    03/07 cardiac catheterization with chronic total occlusion to the mid LAD, fills from the patent LIMA graft. SVG to the diagonal is known to be occluded. Small to to moderate caliber diagonal branch with diffuse severe  disease, unchanged. OM branch with chronic occlusion, fills form the patent SVG graft. Chronic occlusion of RCA, filled from left and right collateral.   No focal targets for PCI.  Continue medical therapy.   Acute on chronic diastolic CHF (congestive heart failure) (HCC) Echocardiogram with preserved LV systolic function with EF 55%, mild LVH, mid/ distal lateral wall appears hypokinetic. (Poor windows). RV with  preserved systolic function, severe aortic stenosis.  Patient was placed on furosemide IV and had one dose of metolazone, negative fluid balance was achieved, with significant improvement in his symptoms.   Continue medical therapy with lisinopril and isosorbide. Possible transition to ARB as outpatient.   Chronic kidney disease, stage 3b ([place]) AKI hyponatremia   Patient tolerated well diuresis, at the time of his discharge his renal function has a serum cr of 1,59 with K at 4,7 and serum bicarbonate at 23.  Plan to continue diuresis with torsemide.  Increase dose in case of volume overload.   Essential hypertension Blood pressure has been controlled, plan to continue with lisinopril and isosorbide.   Type 2 diabetes mellitus with hyperlipidemia (HCC) Uncontrolled T2DM with hyperglycemia,   A1c at 9  Patient was placed on sliding scale and pre meal insulin. Continue with basal insulin therapy.   Continue with statin therapy.   Sleep apnea Continue CPAP, patient endorses compliance  Overweight (BMI 25.0-29.9) Note that when patient first presented to the hospital, BMI slightly greater than 30, consistent with obesity, but following 2 days of diuresis, BMI now under 30 and patient follows in overweight category         Consultants: cardiology  Procedures performed: cardiac catheterization   Disposition: Home Diet recommendation:  Cardiac and Carb modified diet DISCHARGE MEDICATION: Allergies as of 01/12/2023       Reactions   Cefuroxime Diarrhea, Nausea And Vomiting   Severe   Ibuprofen Anaphylaxis, Shortness Of Breath      Cefpodoxime Rash   Ciprofloxacin Rash   Doxycycline Rash   Sulfamethoxazole-trimethoprim Rash   Fluocinolone Other (See Comments)   unknown   Probenecid Other (See Comments)   unknown   Allopurinol Rash   Azithromycin Rash   Sulfa Antibiotics Rash        Medication List     STOP taking these medications    furosemide 40 MG  tablet Commonly known as: LASIX   glyBURIDE 5 MG tablet Commonly known as: DIABETA   metoprolol tartrate 50 MG tablet Commonly known as: LOPRESSOR   Potassium 99 MG Tabs       TAKE these medications    albuterol 108 (90 Base) MCG/ACT inhaler Commonly known as: VENTOLIN HFA Inhale 2 puffs into the lungs every 4 (four) hours as needed for wheezing.   Anucort-HC 25 MG suppository Generic drug: hydrocortisone Place 1 suppository rectally 2 (two) times daily as needed for hemorrhoids.   aspirin EC 81 MG tablet Take 1 tablet (81 mg total) by mouth daily.   atorvastatin 80 MG tablet Commonly known as: Lipitor Take 1 tablet (80 mg total) by mouth daily.   Bydureon 2 MG Susr Generic drug: Exenatide ER Inject 2 mg into the skin once a week.   clobetasol cream 0.05 % Commonly known as: TEMOVATE Apply 1 application topically daily as needed (rash).   clopidogrel 75 MG tablet Commonly known as: PLAVIX Take 1 tablet (75 mg total) by mouth daily.   Cosentyx Sensoready (300 MG) 150 MG/ML Soaj Generic drug: Secukinumab (300 MG Dose) Inject 300 mg into  the skin every 30 (thirty) days.   cyclobenzaprine 10 MG tablet Commonly known as: FLEXERIL Take 10 mg by mouth at bedtime.   desonide 0.05 % cream Commonly known as: DESOWEN Apply 1 application topically daily as needed (rash).   docusate sodium 100 MG capsule Commonly known as: COLACE Take 100 mg by mouth daily.   esomeprazole 40 MG capsule Commonly known as: NEXIUM Take 40 mg by mouth daily before breakfast.   ezetimibe 10 MG tablet Commonly known as: ZETIA Take 10 mg by mouth daily.   Finacea 15 % gel Generic drug: Azelaic Acid Apply 1 application topically 2 (two) times daily as needed (rash).   HumaLOG KwikPen 100 UNIT/ML KiwkPen Generic drug: insulin lispro Inject 18 Units into the skin 3 (three) times daily with meals.   isosorbide mononitrate 30 MG 24 hr tablet Commonly known as: IMDUR Take 1 tablet  (30 mg total) by mouth daily. Please schedule appointment for refills. What changed:  how much to take additional instructions   Lantus SoloStar 100 UNIT/ML Solostar Pen Generic drug: insulin glargine Inject 22 Units into the skin at bedtime.   lisinopril 5 MG tablet Commonly known as: ZESTRIL Take 1 tablet (5 mg total) by mouth daily. Start taking on: January 13, 2023 What changed:  medication strength how much to take   Lyrica 100 MG capsule Generic drug: pregabalin Take 1 capsule (100 mg total) by mouth 2 (two) times daily. What changed: how much to take   mirtazapine 30 MG tablet Commonly known as: REMERON Take 1 tablet by mouth at bedtime.   nitroGLYCERIN 0.4 MG SL tablet Commonly known as: NITROSTAT Place 1 tablet (0.4 mg total) under the tongue every 5 (five) minutes as needed for chest pain. What changed:  how much to take when to take this   nystatin-triamcinolone cream Commonly known as: MYCOLOG II Apply 1 application topically daily as needed (rash).   Percocet 10-325 MG tablet Generic drug: oxyCODONE-acetaminophen Take 1-2 tablets by mouth 2 (two) times daily as needed for pain.   polycarbophil 625 MG tablet Commonly known as: FIBERCON Take 625 mg by mouth daily.   PreviDent 1.1 % Gel dental gel Generic drug: sodium fluoride Place 1 application onto teeth daily.   Soolantra 1 % Crea Generic drug: Ivermectin Apply 1 application  topically daily as needed (rash).   tamsulosin 0.4 MG Caps capsule Commonly known as: FLOMAX Take 0.4 capsules by mouth daily.   torsemide 20 MG tablet Commonly known as: DEMADEX Take 1 tablet (20 mg total) by mouth daily. Take 2 tablets in case of weight gain 2 to 3 lbs in 24  hrs or 5 lbs in 7 days. Start taking on: January 13, 2023   Uloric 40 MG tablet Generic drug: febuxostat Take 1 tablet by mouth Daily.   Viibryd 40 MG Tabs Generic drug: Vilazodone HCl Take 40 mg by mouth Daily.        Follow-up Information      Burnell Blanks, MD Follow up on 01/22/2023.   Specialty: Cardiology Why: Structural Heart Consult at 140pm. Please arrive at 130pm. Contact information: [place]. [address] [address] [phone]                Discharge Exam: Filed Weights   01/09/23 0520 01/11/23 0011 01/11/23 0457  Weight: 105.9 kg 101.7 kg 101.7 kg   BP (!) 121/90 (BP Location: Right Arm)   Pulse 65   Temp 98 F (36.7 C) (  Oral)   Resp 18   Ht '6\' 2"'$  (1.88 m)   Wt 101.7 kg   SpO2 99%   BMI 28.79 kg/m   Patient is feeling better, no dyspnea, edema or orthopnea.  Neurology awake and alert ENT with mild pallor Cardiovascular with S1 and S2 present and rhythmic with no gallops, positive systolic murmur at the base with no rubs No JVD Respiratory with no rales or wheezing, no rhonchi Abdomen with no distention   Condition at discharge: stable  The results of significant diagnostics from this hospitalization (including imaging, microbiology, ancillary and laboratory) are listed below for reference.   Imaging Studies: CARDIAC CATHETERIZATION  Result Date: 01/11/2023   Prox LAD lesion is 100% stenosed.   Mid RCA lesion is 100% stenosed.   Origin to Prox Graft lesion is 100% stenosed.   Origin to Prox Graft lesion is 100% stenosed.   Ost 1st Diag lesion is 90% stenosed.   Ost 1st Mrg lesion is 100% stenosed.   Prox RCA lesion is 60% stenosed.   Mid LAD to Dist LAD lesion is 50% stenosed.   Ost LM to Mid LM lesion is 40% stenosed.   SVG graft was not injected.   SVG graft was not injected.   LIMA graft was visualized by angiography. Chronic total occlusion of the mid LAD. The mid and distal LAD has diffuse moderate disease and fills from the patent LIMA graft.  The small to moderate caliber diagonal branch has diffuse severe disease, unchanged from last cath. The SVG to the diagonal is known to be occluded. The Circumflex has mild proximal stenosis. The first OM branch is  chronically occluded and fills from the patent SVG graft. The large dominant RCA has chronic total occlusion in the mid vessel. The SVG to the distal RCA is known to be occluded. The distal RCA branches fill from left to right collaterals. RA 1 RV 30/0/3 PA 33/12 mean 24 PCWP 4 AO 105/54 Recommendations: Continue medical management of CAD. Diffuse three vessel CAD with continued patency of the LIMA to LAD and SVG to OM. No focal targets for PCI. Normal right heart pressures. Will continue workup for TAVR. He will likely need to have his Pre-TAVR CT scans staged a week from now given the contrast load today. He can be discharged home tomorrow if stable and we can see him in our structural heart office to discuss TAVR in detail and plan his scans.   ECHOCARDIOGRAM COMPLETE  Result Date: 01/08/2023    ECHOCARDIOGRAM REPORT   Patient Name:   DAMIERE BURANDT [facility] Date of Exam: 01/08/2023 Medical Rec #:  [id number]      Height:       74.0 in Accession #:    [id number]     Weight:       233.9 lb Date of Birth:  1945/11/26      BSA:          2.323 m Patient Age:    30 years       BP:           132/82 mmHg Patient Gender: M              HR:           67 bpm. Exam Location:  Inpatient Procedure: 2D Echo, Cardiac Doppler and Color Doppler Indications:    CHF-Acute Systolic [id number]  History:        Patient has no prior history of Echocardiogram examinations.  CHF, CAD and Previous Myocardial Infarction,                 Arrythmias:Bradycardia, Signs/Symptoms:Chest Pain; Risk                 Factors:Hypertension, Sleep Apnea, Diabetes and Dyslipidemia.                 CKD, stage 3.  Sonographer:    Ronny Flurry Referring Phys: [id number] Kyle  1. Difficult acoustic windows limit study Mid/distal lateral wall appears to be hypokinetic.Marland Kitchen Left ventricular ejection fraction, by estimation, is 55%. The left ventricle has normal function. There is mild concentric left ventricular hypertrophy. Left  ventricular diastolic parameters are indeterminate.  2. Right ventricular systolic function is normal. The right ventricular size is normal.  3. Left atrial size was mildly dilated.  4. Mild mitral valve regurgitation.  5. AV is thickened, calcified with restricted motion. Poor acoustic windows limit assessment Peak and mean gradients across the valve are 59 and 32 mm Hg respectively . AVA (VTI) is 0.8 cm2. Dimensionless index is 0.18 consistent with severe AS.Marland Kitchen Aortic  valve regurgitation is not visualized.  6. Aortic dilatation noted. There is mild dilatation of the aortic root, measuring 41 mm. There is mild dilatation of the ascending aorta, measuring 41 mm.  7. The inferior vena cava is dilated in size with <50% respiratory variability, suggesting right atrial pressure of 15 mmHg. FINDINGS  Left Ventricle: Difficult acoustic windows limit study Mid/distal lateral wall appears to be hypokinetic. Left ventricular ejection fraction, by estimation, is 55%. The left ventricle has normal function. The left ventricular internal cavity size was normal in size. There is mild concentric left ventricular hypertrophy. Left ventricular diastolic parameters are indeterminate. Right Ventricle: The right ventricular size is normal. Right vetricular wall thickness was not assessed. Right ventricular systolic function is normal. Left Atrium: Left atrial size was mildly dilated. Right Atrium: Right atrial size was normal in size. Pericardium: There is no evidence of pericardial effusion. Mitral Valve: There is mild thickening of the mitral valve leaflet(s). Mild mitral annular calcification. Mild mitral valve regurgitation. Tricuspid Valve: The tricuspid valve is normal in structure. Tricuspid valve regurgitation is trivial. Aortic Valve: AV is thickened, calcified with restricted motion. Poor acoustic windows limit assessment Peak and mean gradients across the valve are 59 and 32 mm Hg respectively . AVA (VTI) is 0.8 cm2.  Dimensionless index is 0.18 consistent with severe AS. Aortic valve regurgitation is not visualized. Aortic valve mean gradient measures 35.8 mmHg. Aortic valve peak gradient measures 61.1 mmHg. Aortic valve area, by VTI measures 0.65 cm. Pulmonic Valve: The pulmonic valve was normal in structure. Pulmonic valve regurgitation is not visualized. Aorta: Aortic dilatation noted. There is mild dilatation of the aortic root, measuring 41 mm. There is mild dilatation of the ascending aorta, measuring 41 mm. Venous: The inferior vena cava is dilated in size with less than 50% respiratory variability, suggesting right atrial pressure of 15 mmHg. IAS/Shunts: No atrial level shunt detected by color flow Doppler.  LEFT VENTRICLE PLAX 2D LVIDd:         4.60 cm   Diastology LVIDs:         3.00 cm   LV e' medial:    7.72 cm/s LV PW:         1.30 cm   LV E/e' medial:  13.1 LV IVS:        1.20 cm   LV e' lateral:  12.50 cm/s LVOT diam:     2.10 cm   LV E/e' lateral: 8.1 LV SV:         62 LV SV Index:   27 LVOT Area:     3.46 cm  RIGHT VENTRICLE            IVC RV S prime:     8.27 cm/s  IVC diam: 3.00 cm TAPSE (M-mode): 2.1 cm LEFT ATRIUM              Index        RIGHT ATRIUM           Index LA diam:        5.30 cm  2.28 cm/m   RA Area:     23.00 cm LA Vol (A2C):   107.0 ml 46.06 ml/m  RA Volume:   60.30 ml  25.96 ml/m LA Vol (A4C):   58.6 ml  25.23 ml/m LA Biplane Vol: 87.1 ml  37.49 ml/m  AORTIC VALVE AV Area (Vmax):    0.78 cm AV Area (Vmean):   0.71 cm AV Area (VTI):     0.65 cm AV Vmax:           390.75 cm/s AV Vmean:          272.000 cm/s AV VTI:            0.957 m AV Peak Grad:      61.1 mmHg AV Mean Grad:      35.8 mmHg LVOT Vmax:         87.50 cm/s LVOT Vmean:        55.467 cm/s LVOT VTI:          0.179 m LVOT/AV VTI ratio: 0.19  AORTA Ao Root diam: 4.10 cm Ao Asc diam:  4.10 cm MITRAL VALVE MV Area (PHT): 3.99 cm     SHUNTS MV Decel Time: 190 msec     Systemic VTI:  0.18 m MV E velocity: 101.00 cm/s  Systemic  Diam: 2.10 cm MV A velocity: 74.70 cm/s MV E/A ratio:  1.35 Dorris Carnes MD Electronically signed by Dorris Carnes MD Signature Date/Time: 01/08/2023/4:52:53 PM    Final     Microbiology: Results for orders placed or performed during the hospital encounter of 08/18/19  SARS CORONAVIRUS 2 (TAT 6-24 HRS) Nasopharyngeal Nasopharyngeal Swab     Status: None   Collection Time: 08/18/19 12:16 PM   Specimen: Nasopharyngeal Swab  Result Value Ref Range Status   SARS Coronavirus 2 NEGATIVE NEGATIVE Final    Comment: (NOTE) SARS-CoV-2 target nucleic acids are NOT DETECTED. The SARS-CoV-2 RNA is generally detectable in upper and lower respiratory specimens during the acute phase of infection. Negative results do not preclude SARS-CoV-2 infection, do not rule out co-infections with other pathogens, and should not be used as the sole basis for treatment or other patient management decisions. Negative results must be combined with clinical observations, patient history, and epidemiological information. The expected result is Negative. Fact Sheet for Patients: SugarRoll.be Fact Sheet for Healthcare Providers: https://www.woods-mathews.com/ This test is not yet approved or cleared by the [place] FDA and  has been authorized for detection and/or diagnosis of SARS-CoV-2 by FDA under an Emergency Use Authorization (EUA). This EUA will remain  in effect (meaning this test can be used) for the duration of the COVID-19 declaration under Section 56 4(b)(1) of the Act, 21 U.S.C. section 360bbb-3(b)(1), unless the authorization is terminated or revoked sooner. Performed at [facility]  [facility], [place] [address] [place], [place] [address]     Labs: CBC: Recent Labs  Lab 01/08/23 0207 01/09/23 0720 01/10/23 0216 01/11/23 0235 01/11/23 1241 01/11/23 1243 01/12/23 0212  WBC 9.3 7.3 8.0 8.8  --   --  7.9  HGB 10.8* 10.0* 10.5* 11.4* 11.6* 10.9* 11.6*  HCT 33.4*  31.6* 34.9* 37.3* 34.0* 32.0* 37.1*  MCV 79.0* 79.6* 81.7 81.4  --   --  80.3  PLT 319 307 346 385  --   --  0000000   Basic Metabolic Panel: Recent Labs  Lab 01/07/23 1757 01/08/23 0207 01/09/23 0720 01/10/23 0216 01/11/23 0235 01/11/23 0808 01/11/23 1241 01/11/23 1243 01/12/23 0212  NA  --    < > 136 137 136 133* 138 141 135  K  --    < > 3.9 4.9 4.9 4.7 4.5 4.3 4.7  CL  --    < > 102 103 100 98  --   --  101  CO2  --    < > '24 28 25 26  '$ --   --  23  GLUCOSE  --    < > 183* 188* 121* 167*  --   --  214*  BUN  --    < > 66* 72* 72* 68*  --   --  70*  CREATININE  --    < > 2.13* 2.09* 1.86* 1.71*  --   --  1.59*  CALCIUM  --    < > 8.7* 9.1 9.4 9.3  --   --  9.1  MG 2.1  --   --   --  2.3  --   --   --   --    < > = values in this interval not displayed.   Liver Function Tests: Recent Labs  Lab 01/08/23 0207  AST 53*  ALT 31  ALKPHOS 96  BILITOT 0.8  PROT 7.3  ALBUMIN 3.1*   CBG: Recent Labs  Lab 01/11/23 0736 01/11/23 1144 01/11/23 1624 01/11/23 2141 01/12/23 0751  GLUCAP 157* 140* 289* 168* 198*    Discharge time spent: greater than 30 minutes.  Signed: Tawni Millers, MD [facility] 01/12/2023

## 2023-01-12 NOTE — Telephone Encounter (Signed)
**Note De-identified  Obfuscation** -----  **Note De-Identified  Obfuscation** Message from Ledora Bottcher, [place] sent at 01/12/2023  9:27 AM EST ----- Pt needs a TOC phone call please. Discharging this afternoon.   Thanks Angie

## 2023-01-12 NOTE — Progress Notes (Signed)
Heart Failure Navigator Progress Note  Assessed for Heart & Vascular TOC clinic readiness.  Patient EF 55%, has a TAVR evaluation outpatient on 01/22/2023. .   Navigator will sign off at this time.   Earnestine Leys, BSN, Clinical cytogeneticist Only

## 2023-01-12 NOTE — Progress Notes (Addendum)
Rounding Note    Patient Name: Dylan Bruce Date of Encounter: 01/12/2023  [place] Cardiologist: Kirk Ruths, MD   Subjective   Pt found sleeping with CPAP on, he expresses ready for discharge  Inpatient Medications    Scheduled Meds:  aspirin EC  81 mg Oral Daily   atorvastatin  80 mg Oral Daily   clopidogrel  75 mg Oral Daily   ezetimibe  10 mg Oral Daily   heparin  5,000 Units Subcutaneous Q8H   hydrocortisone  25 mg Rectal BID   insulin aspart  0-15 Units Subcutaneous TID WC   insulin aspart  0-5 Units Subcutaneous QHS   insulin aspart  8 Units Subcutaneous TID WC   insulin glargine-yfgn  10 Units Subcutaneous QHS   isosorbide mononitrate  30 mg Oral Daily   lisinopril  5 mg Oral Daily   mirtazapine  30 mg Oral QHS   pantoprazole  40 mg Oral Daily   potassium chloride  40 mEq Oral Daily   pregabalin  100 mg Oral BID   sodium chloride flush  3 mL Intravenous Q12H   sodium chloride flush  3 mL Intravenous Q12H   torsemide  20 mg Oral Daily   Vilazodone HCl  40 mg Oral Daily   Continuous Infusions:  sodium chloride     PRN Meds: sodium chloride, acetaminophen **OR** acetaminophen, hydrocortisone, oxyCODONE-acetaminophen **AND** oxyCODONE, polyethylene glycol, sodium chloride flush, tiZANidine   Vital Signs    Vitals:   01/11/23 1619 01/11/23 2030 01/12/23 0513 01/12/23 0752  BP: 108/78 102/68 121/88 (!) 121/90  Pulse: 86 74 85 65  Resp: '18 20 16 18  '$ Temp: 98.1 F (36.7 C) 98 F (36.7 C) 97.9 F (36.6 C) 98 F (36.7 C)  TempSrc: Oral Oral Oral Oral  SpO2: 98%  99% 99%  Weight:      Height:        Intake/Output Summary (Last 24 hours) at 01/12/2023 0926 Last data filed at 01/11/2023 1800 Gross per 24 hour  Intake 1639.33 ml  Output 400 ml  Net 1239.33 ml      01/11/2023    4:57 AM 01/11/2023   12:11 AM 01/09/2023    5:20 AM  Last 3 Weights  Weight (lbs) 224 lb 3.3 oz 224 lb 3.3 oz 233 lb 7.5 oz  Weight (kg) 101.7 kg 101.7 kg 105.9  kg      Telemetry    Sinus with second degree mobitz II on exam while sleeping with CPAP - Personally Reviewed  ECG    No new tracings - Personally Reviewed  Physical Exam   GEN: No acute distress.   Neck: No JVD Cardiac: RRR, 4/6 holosystolic murmur Respiratory: Clear to auscultation bilaterally. GI: Soft, nontender, non-distended  MS: No edema; No deformity. Neuro:  Nonfocal  Psych: Normal affect  Left radial cath site C/D/I Left forearm with area of redness/warmth/tenderness/swelling  Labs    High Sensitivity Troponin:   Recent Labs  Lab 01/07/23 1757 01/08/23 1120 01/08/23 1352  TROPONINIHS 11,393* 4,319* 4,530*     Chemistry Recent Labs  Lab 01/07/23 1757 01/08/23 0207 01/09/23 0720 01/11/23 0235 01/11/23 [id number] 01/11/23 1241 01/11/23 1243 01/12/23 0212  NA  --  137   < > 136 133* 138 141 135  K  --  3.8   < > 4.9 4.7 4.5 4.3 4.7  CL  --  100   < > 100 98  --   --  101  CO2  --  26   < > 25 26  --   --  23  GLUCOSE  --  222*   < > 121* 167*  --   --  214*  BUN  --  45*   < > 72* 68*  --   --  70*  CREATININE  --  1.64*   < > 1.86* 1.71*  --   --  1.59*  CALCIUM  --  9.4   < > 9.4 9.3  --   --  9.1  MG 2.1  --   --  2.3  --   --   --   --   PROT  --  7.3  --   --   --   --   --   --   ALBUMIN  --  3.1*  --   --   --   --   --   --   AST  --  53*  --   --   --   --   --   --   ALT  --  31  --   --   --   --   --   --   ALKPHOS  --  96  --   --   --   --   --   --   BILITOT  --  0.8  --   --   --   --   --   --   GFRNONAA  --  44*   < > 38* 41*  --   --  45*  ANIONGAP  --  11   < > 11 9  --   --  11   < > = values in this interval not displayed.    Lipids  Recent Labs  Lab 01/10/23 0216  CHOL 91  TRIG 149  HDL 24*  LDLCALC 37  CHOLHDL 3.8    Hematology Recent Labs  Lab 01/10/23 0216 01/11/23 0235 01/11/23 1241 01/11/23 1243 01/12/23 0212  WBC 8.0 8.8  --   --  7.9  RBC 4.27 4.58  --   --  4.62  HGB 10.5* 11.4* 11.6* 10.9* 11.6*  HCT  34.9* 37.3* 34.0* 32.0* 37.1*  MCV 81.7 81.4  --   --  80.3  MCH 24.6* 24.9*  --   --  25.1*  MCHC 30.1 30.6  --   --  31.3  RDW 16.6* 16.9*  --   --  17.1*  PLT 346 385  --   --  391   Thyroid No results for input(s): "TSH", "FREET4" in the last 168 hours.  BNPNo results for input(s): "BNP", "PROBNP" in the last 168 hours.  DDimer No results for input(s): "DDIMER" in the last 168 hours.   Radiology    CARDIAC CATHETERIZATION  Result Date: 01/11/2023   Prox LAD lesion is 100% stenosed.   Mid RCA lesion is 100% stenosed.   Origin to Prox Graft lesion is 100% stenosed.   Origin to Prox Graft lesion is 100% stenosed.   Ost 1st Diag lesion is 90% stenosed.   Ost 1st Mrg lesion is 100% stenosed.   Prox RCA lesion is 60% stenosed.   Mid LAD to Dist LAD lesion is 50% stenosed.   Ost LM to Mid LM lesion is 40% stenosed.   SVG graft was not injected.   SVG graft was not injected.   LIMA graft was visualized by angiography. Chronic total occlusion  of the mid LAD. The mid and distal LAD has diffuse moderate disease and fills from the patent LIMA graft.  The small to moderate caliber diagonal branch has diffuse severe disease, unchanged from last cath. The SVG to the diagonal is known to be occluded. The Circumflex has mild proximal stenosis. The first OM branch is chronically occluded and fills from the patent SVG graft. The large dominant RCA has chronic total occlusion in the mid vessel. The SVG to the distal RCA is known to be occluded. The distal RCA branches fill from left to right collaterals. RA 1 RV 30/0/3 PA 33/12 mean 24 PCWP 4 AO 105/54 Recommendations: Continue medical management of CAD. Diffuse three vessel CAD with continued patency of the LIMA to LAD and SVG to OM. No focal targets for PCI. Normal right heart pressures. Will continue workup for TAVR. He will likely need to have his Pre-TAVR CT scans staged a week from now given the contrast load today. He can be discharged home tomorrow if stable  and we can see him in our structural heart office to discuss TAVR in detail and plan his scans.    Cardiac Studies   Right and left heart catheterization 01/11/23:   Prox LAD lesion is 100% stenosed.   Mid RCA lesion is 100% stenosed.   Origin to Prox Graft lesion is 100% stenosed.   Origin to Prox Graft lesion is 100% stenosed.   Ost 1st Diag lesion is 90% stenosed.   Ost 1st Mrg lesion is 100% stenosed.   Prox RCA lesion is 60% stenosed.   Mid LAD to Dist LAD lesion is 50% stenosed.   Ost LM to Mid LM lesion is 40% stenosed.   SVG graft was not injected.   SVG graft was not injected.   LIMA graft was visualized by angiography.   Chronic total occlusion of the mid LAD. The mid and distal LAD has diffuse moderate disease and fills from the patent LIMA graft.  The small to moderate caliber diagonal branch has diffuse severe disease, unchanged from last cath. The SVG to the diagonal is known to be occluded.  The Circumflex has mild proximal stenosis. The first OM branch is chronically occluded and fills from the patent SVG graft.  The large dominant RCA has chronic total occlusion in the mid vessel. The SVG to the distal RCA is known to be occluded. The distal RCA branches fill from left to right collaterals.    RA 1 RV 30/0/3 PA 33/12 mean 24 PCWP 4 AO 105/54   Recommendations: Continue medical management of CAD. Diffuse three vessel CAD with continued patency of the LIMA to LAD and SVG to OM. No focal targets for PCI. Normal right heart pressures. Will continue workup for TAVR. He will likely need to have his Pre-TAVR CT scans staged a week from now given the contrast load today. He can be discharged home tomorrow if stable and we can see him in our structural heart office to discuss TAVR in detail and plan his scans.     Echo 01/08/23:  1. Difficult acoustic windows limit study Mid/distal lateral wall appears  to be hypokinetic.Marland Kitchen Left ventricular ejection fraction, by estimation, is   55%. The left ventricle has normal function. There is mild concentric left  ventricular hypertrophy. Left  ventricular diastolic parameters are indeterminate.   2. Right ventricular systolic function is normal. The right ventricular  size is normal.   3. Left atrial size was mildly dilated.   4.  Mild mitral valve regurgitation.   5. AV is thickened, calcified with restricted motion. Poor acoustic  windows limit assessment Peak and mean gradients across the valve are 59  and 32 mm Hg respectively . AVA (VTI) is 0.8 cm2. Dimensionless index is  0.18 consistent with severe AS.Marland Kitchen Aortic   valve regurgitation is not visualized.   6. Aortic dilatation noted. There is mild dilatation of the aortic root,  measuring 41 mm. There is mild dilatation of the ascending aorta,  measuring 41 mm.   7. The inferior vena cava is dilated in size with <50% respiratory  variability, suggesting right atrial pressure of 15 mmHg.    Patient Profile     77 y.o. male with hx CAD s/p CABG, CHF, mod AS on Echo, HTN, HLD, asthma, DM, sleep apnea, anxiety depression gout, neuropathy, PAD, CKD3 GERD presenting with SOB. Not taking home meds in 3-4 days. pBNP 13,500 and troponin not hs 15. CXR with pul edema. For last 3-4 years he becomes SOB with CPAP and has to go to recliner for part of the night; here with NSTEMI and decompensated CHF   Assessment & Plan    NSTEMI CAD s/p CABG Hyperlipidemia with LDL goal < 70 Known occluded 2/4 vein grafts Troponin trended down from peak of 60454 Right and left heat catheterization with known native disease, patent LIMA-LAD and SVG-OM1, occluded SVG-diagonal and SVG-dRCA. No targets for PCI, largely stable disease.  - 01/10/2023: Cholesterol 91; HDL 24; LDL Cholesterol 37; Triglycerides 149; VLDL 30 - excellent control - continue ASA, plavix, lipitor, zetia, imdur 30 mg   Hypertension Imdur 30 mg, lisinopril 5 mg, 20 mg torsemide BP controlled today   Severe aortic  stenosis Confirmed on cath yesterday He has an appt with structural heart team to review TAVR Plan for pre-TAVR CT scans at least 1 week from now given renal insufficiency and contrast load yesterday - avoid dehydration - lasix 80 mg qAM and 40 mg qPM switched to 20 mg torsemide daily, provide instruction for PRN torsemide for 3 lb weight gain overnight - BMP in 1 week   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     For questions or updates, please contact [place] Please consult [email or web address] for contact info under        Signed, Ledora Bottcher, PA  01/12/2023, 9:26 AM

## 2023-01-15 NOTE — Telephone Encounter (Signed)
Follow up call to patient for concerns about his cath site on left arm forearm at elbow. Patient reports that the site continues to be sore with bruising and some redness. He states it has improved since Saturday. He denies drainage or redness radiating up or down the arm from site.    Advised to use warm compresses for comfort. If site worsens, he notices redness radiating away from site or drainage to go to the ED or urgent care for evaluation. Patient verbalized understanding and had no questions.  Patient is scheduled for a follow up on 01/22/23.

## 2023-01-15 NOTE — Telephone Encounter (Signed)
**Note De-Identified  Obfuscation** Patient contacted regarding discharge from [facility] on 01/12/2023.  Patient understands to follow up with Dr Angelena Form on 01/22/2023 at 1:40 at [address] [place] in [place], [place] [address].  Patient understands discharge instructions? Yes Patient understands medications and regiment? Yes Patient understands to bring all medications to this visit? Yes Ask patient:  Are you enrolled in My Chart: Yes  The pt reports that his cath site is swollen, painful, red, and that it is warm to the touch. He states that it was swollen and hurting while in he was in the hospital and that he was advised by a MD that he may have Phlebitis. He is aware that I am forwarding this message to our triage nurses and that one of them will call him back for evaluation.  He denies having any CP/discomfort, nausea, vomiting, diaphoresis, dizziness, or lightheadedness since his hoapital d/c on 3/8.  He reports that is breathing is much better since going to the hospital.  He verified that he does have [facility] phone number and that he will call us if he has any questions or concerns. He thanked me for calling him and is awaiting a call back from our triage nurse team.

## 2023-01-21 NOTE — Progress Notes (Unsigned)
[facility]ural [facility] Consult Note  No chief complaint on file.  History of Present Illness: 77 yo male with history of CAD s/p 4V CABG, chronic diastolic CHF, HTN, hyperlipidemia, diabetes, sleep apnea, anxiety, depression, gout, PAD, neuropathy, psoriatic arthritis, chronic kidney disease stage 3, , GERD and severe aortic stenosis who is here today as a new consult, referred by Dr. Harl Bowie, for further discussion regarding his aortic stenosis and possible TAVR. He was followed remotely by Dr. Stanford Breed. He had 4V CABG in 2003 and PCI of the SVG to PDA in 2010. He has been followed by [facility] since 2018 for moderate aortic stenosis. He was admitted to [facility] 01/07/23 with dyspnea, orthopnea and LE edema. Echo 01/08/23 with LVEF=55%. Mild mitral regurgitation. Severe aortic stenosis with mean gradient 32 mmHg, AVA 0.65 cm2, DI 0.18, SVI 27. Cardiac cath 01/11/23 with 2/4 patent bypass grafts (LIMA to LAD, SVG to OM) with collateral filling of the distal RCA. His CAD is felt to be stable. He was discharged from [facility] on 01/12/23 and was doing much better clinically with improvement in LE edema and dyspnea.   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   GERD    HYPERLIPIDEMIA    Hypertension    PSORIASIS    SLEEP APNEA    SPINA BIFIDA     Past Surgical History:  Procedure Laterality Date   BACK SURGERY     CARPAL TUNNEL RELEASE Left 08/21/2019   Procedure: CARPAL TUNNEL RELEASE;  Surgeon: Daryll Brod, MD;  Location: [place];  Service: Orthopedics;  Laterality: Left;  AXILLARY BLOCK   CORONARY ARTERY BYPASS GRAFT     JOINT REPLACEMENT  Left    shoulder   LEFT HEART CATH AND CORS/GRAFTS ANGIOGRAPHY N/A 06/18/2018   Procedure: LEFT HEART CATH AND CORS/GRAFTS ANGIOGRAPHY;  Surgeon: Lorretta Harp, MD;  Location: [place] [facility];  Service: Cardiovascular;  Laterality: N/A;   RIGHT/LEFT HEART CATH AND CORONARY/GRAFT ANGIOGRAPHY N/A 01/11/2023   Procedure: RIGHT/LEFT HEART CATH AND CORONARY/GRAFT ANGIOGRAPHY;  Surgeon: Burnell Blanks, MD;  Location: [place] [facility];  Service: Cardiovascular;  Laterality: N/A;   TRIGGER FINGER RELEASE Left 08/21/2019   Procedure: RELEASE TRIGGER LEFT SMALL FINGER LEFT INDEX;  Surgeon: Daryll Brod, MD;  Location: [place];  Service: Orthopedics;  Laterality: Left;   ULNAR NERVE TRANSPOSITION Left 08/21/2019   Procedure: DECOMPRESSION WITH ULNAR NERVE LEFT CUBITAL TUNNEL ULNAR;  Surgeon: Daryll Brod, MD;  Location: [place];  Service: Orthopedics;  Laterality: Left;    Current Outpatient Medications  Medication Sig Dispense Refill   albuterol (PROVENTIL HFA;VENTOLIN HFA) 108 (90 Base) MCG/ACT inhaler Inhale 2 puffs into the lungs every 4 (four) hours as needed for wheezing.     aspirin EC 81 MG EC tablet Take 1 tablet (81 mg total) by mouth daily. 90 tablet 3   atorvastatin (LIPITOR) 80 MG tablet Take 1 tablet (80 mg total) by mouth daily. 30 tablet 0   BYDUREON 2 MG SUSR Inject 2 mg into the skin once a week.     clobetasol cream (  TEMOVATE) [id number] % Apply 1 application topically daily as needed (rash).      clopidogrel (PLAVIX) 75 MG tablet Take 1 tablet (75 mg total) by mouth daily. 90 tablet 3   COSENTYX SENSOREADY, 300 MG, 150 MG/ML SOAJ Inject 300 mg into the skin every 30 (thirty) days.     cyclobenzaprine (FLEXERIL) 10 MG tablet Take 10 mg by mouth at bedtime.      desonide (DESOWEN) 0.05 % cream Apply 1 application topically daily as needed (rash).      docusate sodium (COLACE) 100 MG capsule Take 100 mg by mouth daily.      esomeprazole  (NEXIUM) 40 MG capsule Take 40 mg by mouth daily before breakfast.     ezetimibe (ZETIA) 10 MG tablet Take 10 mg by mouth daily.     FINACEA 15 % cream Apply 1 application topically 2 (two) times daily as needed (rash).      hydrocortisone (ANUCORT-HC) 25 MG suppository Place 1 suppository rectally 2 (two) times daily as needed for hemorrhoids.     insulin lispro (HUMALOG KWIKPEN) 100 UNIT/ML KiwkPen Inject 18 Units into the skin 3 (three) times daily with meals.      isosorbide mononitrate (IMDUR) 30 MG 24 hr tablet Take 1 tablet (30 mg total) by mouth daily. Please schedule appointment for refills. 30 tablet 0   LANTUS SOLOSTAR 100 UNIT/ML Solostar Pen Inject 22 Units into the skin at bedtime.  5   lisinopril (ZESTRIL) 5 MG tablet Take 1 tablet (5 mg total) by mouth daily. 30 tablet 0   LYRICA 100 MG capsule Take 1 capsule (100 mg total) by mouth 2 (two) times daily.  5   mirtazapine (REMERON) 30 MG tablet Take 1 tablet by mouth at bedtime.      nitroGLYCERIN (NITROSTAT) 0.4 MG SL tablet Place 1 tablet (0.4 mg total) under the tongue every 5 (five) minutes as needed for chest pain. (Patient taking differently: Place 1.2 mg under the tongue at bedtime.) 90 tablet 3   nystatin-triamcinolone (MYCOLOG II) cream Apply 1 application topically daily as needed (rash).      oxyCODONE-acetaminophen (PERCOCET) 10-325 MG per tablet Take 1-2 tablets by mouth 2 (two) times daily as needed for pain.      polycarbophil (FIBERCON) 625 MG tablet Take 625 mg by mouth daily.     sodium fluoride (PREVIDENT) 1.1 % GEL dental gel Place 1 application onto teeth daily.      SOOLANTRA 1 % CREA Apply 1 application  topically daily as needed (rash).  3   tamsulosin (FLOMAX) 0.4 MG CAPS capsule Take 0.4 capsules by mouth daily.     torsemide (DEMADEX) 20 MG tablet Take 1 tablet (20 mg total) by mouth daily. (Take 2 tablets in case of weight gain of 2 to 3 lbs in 24 hrs or 5 lbs in 7 days. 30 tablet 0   ULORIC 40 MG tablet  Take 1 tablet by mouth Daily.     VIIBRYD 40 MG TABS Take 40 mg by mouth Daily.      No current facility-administered medications for this visit.    Allergies  Allergen Reactions   Cefuroxime Diarrhea and Nausea And Vomiting    Severe   Ibuprofen Anaphylaxis and Shortness Of Breath        Cefpodoxime Rash   Ciprofloxacin Rash   Doxycycline Rash   Sulfamethoxazole-Trimethoprim Rash   Fluocinolone Other (See Comments)    unknown   Probenecid Other (See Comments)  unknown   Allopurinol Rash   Azithromycin Rash   Sulfa Antibiotics Rash    Social History   Socioeconomic History   Marital status: Married    Spouse name: Not on file   Number of children: Not on file   Years of education: Not on file   Highest education level: Not on file  Occupational History   Not on file  Tobacco Use   Smoking status: Never   Smokeless tobacco: Never  Substance and Sexual Activity   Alcohol use: Yes   Drug use: No   Sexual activity: Not on file  Other Topics Concern   Not on file  Social History Narrative   Not on file   Social Determinants of Health   Financial Resource Strain: Not on file  Food Insecurity: No Food Insecurity (01/07/2023)   Hunger Vital Sign    Worried About Running Out of Food in the Last Year: Never true    Ran Out of Food in the Last Year: Never true  Transportation Needs: No Transportation Needs (01/07/2023)   PRAPARE - Hydrologist (Medical): No    Lack of Transportation (Non-Medical): No  Physical Activity: Not on file  Stress: Not on file  Social Connections: Not on file  Intimate Partner Violence: Not At Risk (01/07/2023)   Humiliation, Afraid, Rape, and Kick questionnaire    Fear of Current or Ex-Partner: No    Emotionally Abused: No    Physically Abused: No    Sexually Abused: No    No family history on file.  Review of Systems:  As stated in the HPI and otherwise negative.   There were no vitals taken for this  visit.  Physical Examination: General: Well developed, well nourished, NAD  HEENT: OP clear, mucus membranes moist  SKIN: warm, dry. No rashes. Neuro: No focal deficits  Musculoskeletal: Muscle strength 5/5 all ext  Psychiatric: Mood and affect normal  Neck: No JVD, no carotid bruits, no thyromegaly, no lymphadenopathy.  Lungs:Clear bilaterally, no wheezes, rhonci, crackles Cardiovascular: Regular rate and rhythm. *** Loud, harsh, late peaking systolic murmur.  Abdomen:Soft. Bowel sounds present. Non-tender.  Extremities: *** No lower extremity edema. Pulses are 2 + in the bilateral DP/PT.  EKG:  EKG {ACTION; IS/IS [id number] ordered today. The ekg ordered today demonstrates ***  Echo 01/07/21: 1. Difficult acoustic windows limit study Mid/distal lateral wall appears  to be hypokinetic.Marland Kitchen Left ventricular ejection fraction, by estimation, is  55%. The left ventricle has normal function. There is mild concentric left  ventricular hypertrophy. Left  ventricular diastolic parameters are indeterminate.   2. Right ventricular systolic function is normal. The right ventricular  size is normal.   3. Left atrial size was mildly dilated.   4. Mild mitral valve regurgitation.   5. AV is thickened, calcified with restricted motion. Poor acoustic  windows limit assessment Peak and mean gradients across the valve are 59  and 32 mm Hg respectively . AVA (VTI) is 0.8 cm2. Dimensionless index is  0.18 consistent with severe AS.Marland Kitchen Aortic   valve regurgitation is not visualized.   6. Aortic dilatation noted. There is mild dilatation of the aortic root,  measuring 41 mm. There is mild dilatation of the ascending aorta,  measuring 41 mm.   7. The inferior vena cava is dilated in size with <50% respiratory  variability, suggesting right atrial pressure of 15 mmHg.   FINDINGS   Left Ventricle: Difficult acoustic windows limit study Mid/distal lateral  wall appears to be hypokinetic. Left  ventricular ejection fraction, by  estimation, is 55%. The left ventricle has normal function. The left  ventricular internal cavity size was  normal in size. There is mild concentric left ventricular hypertrophy.  Left ventricular diastolic parameters are indeterminate.   Right Ventricle: The right ventricular size is normal. Right vetricular  wall thickness was not assessed. Right ventricular systolic function is  normal.   Left Atrium: Left atrial size was mildly dilated.   Right Atrium: Right atrial size was normal in size.   Pericardium: There is no evidence of pericardial effusion.   Mitral Valve: There is mild thickening of the mitral valve leaflet(s).  Mild mitral annular calcification. Mild mitral valve regurgitation.   Tricuspid Valve: The tricuspid valve is normal in structure. Tricuspid  valve regurgitation is trivial.   Aortic Valve: AV is thickened, calcified with restricted motion. Poor  acoustic windows limit assessment Peak and mean gradients across the valve  are 59 and 32 mm Hg respectively . AVA (VTI) is 0.8 cm2. Dimensionless  index is 0.18 consistent with severe  AS. Aortic valve regurgitation is not visualized. Aortic valve mean  gradient measures 35.8 mmHg. Aortic valve peak gradient measures 61.1  mmHg. Aortic valve area, by VTI measures 0.65 cm.   Pulmonic Valve: The pulmonic valve was normal in structure. Pulmonic valve  regurgitation is not visualized.   Aorta: Aortic dilatation noted. There is mild dilatation of the aortic  root, measuring 41 mm. There is mild dilatation of the ascending aorta,  measuring 41 mm.   Venous: The inferior vena cava is dilated in size with less than 50%  respiratory variability, suggesting right atrial pressure of 15 mmHg.   IAS/Shunts: No atrial level shunt detected by color flow Doppler.     LEFT VENTRICLE  PLAX 2D  LVIDd:         4.60 cm   Diastology  LVIDs:         3.00 cm   LV e' medial:    7.72 cm/s  LV  PW:         1.30 cm   LV E/e' medial:  13.1  LV IVS:        1.20 cm   LV e' lateral:   12.50 cm/s  LVOT diam:     2.10 cm   LV E/e' lateral: 8.1  LV SV:         62  LV SV Index:   27  LVOT Area:     3.46 cm     RIGHT VENTRICLE            IVC  RV S prime:     8.27 cm/s  IVC diam: 3.00 cm  TAPSE (M-mode): 2.1 cm   LEFT ATRIUM              Index        RIGHT ATRIUM           Index  LA diam:        5.30 cm  2.28 cm/m   RA Area:     23.00 cm  LA Vol (A2C):   107.0 ml 46.06 ml/m  RA Volume:   60.30 ml  25.96 ml/m  LA Vol (A4C):   58.6 ml  25.23 ml/m  LA Biplane Vol: 87.1 ml  37.49 ml/m   AORTIC VALVE  AV Area (Vmax):    0.78 cm  AV Area (Vmean):   0.71 cm  AV  Area (VTI):     0.65 cm  AV Vmax:           390.75 cm/s  AV Vmean:          272.000 cm/s  AV VTI:            0.957 m  AV Peak Grad:      61.1 mmHg  AV Mean Grad:      35.8 mmHg  LVOT Vmax:         87.50 cm/s  LVOT Vmean:        55.467 cm/s  LVOT VTI:          0.179 m  LVOT/AV VTI ratio: 0.19    AORTA  Ao Root diam: 4.10 cm  Ao Asc diam:  4.10 cm   MITRAL VALVE  MV Area (PHT): 3.99 cm     SHUNTS  MV Decel Time: 190 msec     Systemic VTI:  0.18 m  MV E velocity: 101.00 cm/s  Systemic Diam: 2.10 cm  MV A velocity: 74.70 cm/s  MV E/A ratio:  1.35   Cardiac cath 01/11/23: Prox LAD lesion is 100% stenosed.   Mid RCA lesion is 100% stenosed.   Origin to Prox Graft lesion is 100% stenosed.   Origin to Prox Graft lesion is 100% stenosed.   Ost 1st Diag lesion is 90% stenosed.   Ost 1st Mrg lesion is 100% stenosed.   Prox RCA lesion is 60% stenosed.   Mid LAD to Dist LAD lesion is 50% stenosed.   Ost LM to Mid LM lesion is 40% stenosed.   SVG graft was not injected.   SVG graft was not injected.   LIMA graft was visualized by angiography.   Chronic total occlusion of the mid LAD. The mid and distal LAD has diffuse moderate disease and fills from the patent LIMA graft.  The small to moderate caliber diagonal  branch has diffuse severe disease, unchanged from last cath. The SVG to the diagonal is known to be occluded.  The Circumflex has mild proximal stenosis. The first OM branch is chronically occluded and fills from the patent SVG graft.  The large dominant RCA has chronic total occlusion in the mid vessel. The SVG to the distal RCA is known to be occluded. The distal RCA branches fill from left to right collaterals.    RA 1 RV 30/0/3 PA 33/12 mean 24 PCWP 4 AO 105/54   Recommendations: Continue medical management of CAD. Diffuse three vessel CAD with continued patency of the LIMA to LAD and SVG to OM. No focal targets for PCI. Normal right heart pressures. Will continue workup for TAVR. He will likely need to have his Pre-TAVR CT scans staged a week from now given the contrast load today. He can be discharged home tomorrow if stable and we can see him in our structural heart office to discuss TAVR in detail and plan his scans.    Indications  Acute on chronic systolic CHF (congestive heart failure), NYHA class 4 (HCC) [I50.23 (ICD-10-CM)]  Elevated troponin [R79.89 (ICD-10-CM)]  Severe aortic stenosis [I35.0 (ICD-10-CM)]   Procedural Details  Technical Details Indication: 77 yo male with CAD s/p 4V CABG now admitted with dyspnea and found to have acute CHF, elevated troponin, severe aortic stenosis. He has been diuresed and is clinically doing much better  Procedure: The risks, benefits, complications, treatment options, and expected outcomes were discussed with the patient. The patient and/or family concurred with the proposed  plan, giving informed consent. The patient was sedated with Versed and Fentanyl. The IV catheter in the right antecubital vein was changed for a 5 [place] sheath. Right heart catheterization performed with a balloon tipped catheter. The left wrist was prepped and draped in a sterile fashion. 1% lidocaine was used for local anesthesia. Using the modified Seldinger access  technique, a 5 French sheath was placed in the left radial artery. 3 mg Verapamil was given through the sheath. Weight based IV heparin was given. Standard diagnostic catheters were used to perform selective coronary angiography. I engaged the LIMA graft with the JR4 catheter and engaged the SVG to OM with an AL-2 catheter. I did not cross the aortic valve. All catheter exchanges were performed over an exchange length guidewire.   The sheath was removed from the left radial artery and a hemostasis band was applied at the arteriotomy site on the left wrist.      Estimated blood loss <50 mL.   During this procedure medications were administered to achieve and maintain moderate conscious sedation while the patient's heart rate, blood pressure, and oxygen saturation were continuously monitored and I was present face-to-face 100% of this time.   Medications (Filter: Administrations occurring from 1209 to 1328 on 01/11/23)  important  Continuous medications are totaled by the amount administered until 01/11/23 1328.   Heparin (Porcine) in NaCl 1000-0.9 UT/500ML-% SOLN (mL)  Total volume: 1,000 mL Date/Time Rate/Dose/Volume Action   01/11/23 1213 500 mL Given   1213 500 mL Given   fentaNYL (SUBLIMAZE) injection (mcg)  Total dose: 50 mcg Date/Time Rate/Dose/Volume Action   01/11/23 1218 25 mcg Given   1237 25 mcg Given   midazolam (VERSED) injection (mg)  Total dose: 2 mg Date/Time Rate/Dose/Volume Action   01/11/23 1218 1 mg Given   1237 1 mg Given   lidocaine (PF) (XYLOCAINE) 1 % injection (mL)  Total volume: 3 mL Date/Time Rate/Dose/Volume Action   01/11/23 1232 1 mL Given   1238 2 mL Given   Radial Cocktail/Verapamil only (mL)  Total volume: 10 mL Date/Time Rate/Dose/Volume Action   01/11/23 1241 10 mL Given   heparin sodium (porcine) injection (Units)  Total dose: 5,000 Units Date/Time Rate/Dose/Volume Action   01/11/23 1245 5,000 Units Given   iohexol (OMNIPAQUE) 350 MG/ML  injection (mL)  Total volume: 60 mL Date/Time Rate/Dose/Volume Action   01/11/23 1308 60 mL Given   iron sucrose (VENOFER) 200 mg in sodium chloride 0.9 % 100 mL IVPB (mL/hr)  Total dose: Cannot be calculated* Dosing weight: 103.1 *Administration dose not documented Date/Time Rate/Dose/Volume Action   01/11/23 1209 *Not included in total MAR Hold   aspirin EC tablet 81 mg (mg)  Total dose: Cannot be calculated* Dosing weight: 103.1 *Administration dose not documented Date/Time Rate/Dose/Volume Action   01/11/23 1209 *Not included in total MAR Hold   atorvastatin (LIPITOR) tablet 80 mg (mg)  Total dose: Cannot be calculated* Dosing weight: 103.1 *Administration dose not documented Date/Time Rate/Dose/Volume Action   01/11/23 1209 *Not included in total MAR Hold   clopidogrel (PLAVIX) tablet 75 mg (mg)  Total dose: Cannot be calculated* Dosing weight: 103.1 *Administration dose not documented Date/Time Rate/Dose/Volume Action   01/11/23 1209 *Not included in total MAR Hold   ezetimibe (ZETIA) tablet 10 mg (mg)  Total dose: Cannot be calculated* Dosing weight: 103.1 *Administration dose not documented Date/Time Rate/Dose/Volume Action   01/11/23 1209 *Not included in total MAR Hold   furosemide (LASIX) 120 mg in dextrose 5 %  50 mL IVPB (mL/hr)  Total dose: Cannot be calculated* Dosing weight: 106.1 *Administration dose not documented Date/Time Rate/Dose/Volume Action   01/11/23 1209 *Not included in total MAR Hold   hydrocortisone (ANUSOL-HC) suppository 25 mg (mg)  Total dose: Cannot be calculated* Dosing weight: 106.1 *Administration dose not documented Date/Time Rate/Dose/Volume Action   01/11/23 1209 *Not included in total MAR Hold   hydrocortisone (ANUSOL-HC) suppository 25 mg (mg)  Total dose: Cannot be calculated* Dosing weight: 105.9 *Administration dose not documented Date/Time Rate/Dose/Volume Action   01/11/23 1209 *Not included in total MAR Hold   insulin aspart  (novoLOG) injection 0-15 Units (Units)  Total dose: Cannot be calculated* Dosing weight: 103.1 *Administration dose not documented Date/Time Rate/Dose/Volume Action   01/11/23 1209 *Not included in total MAR Hold   1213 *Not included in total Not Given   insulin aspart (novoLOG) injection 0-5 Units (Units)  Total dose: Cannot be calculated* Dosing weight: 103.1 *Administration dose not documented Date/Time Rate/Dose/Volume Action   01/11/23 1209 *Not included in total MAR Hold   insulin aspart (novoLOG) injection 8 Units (Units)  Total dose: Cannot be calculated* Dosing weight: 106.1 *Administration dose not documented Date/Time Rate/Dose/Volume Action   01/11/23 1209 *Not included in total MAR Hold   1213 *8 Units Not Given   insulin glargine-yfgn (SEMGLEE) injection 10 Units (Units)  Total dose: Cannot be calculated* Dosing weight: 105.9 *Administration dose not documented Date/Time Rate/Dose/Volume Action   01/11/23 1209 *Not included in total MAR Hold   isosorbide mononitrate (IMDUR) 24 hr tablet 30 mg (mg)  Total dose: Cannot be calculated* Dosing weight: 103.1 *Administration dose not documented Date/Time Rate/Dose/Volume Action   01/11/23 1209 *Not included in total MAR Hold   lisinopril (ZESTRIL) tablet 5 mg (mg)  Total dose: Cannot be calculated* Dosing weight: 103.1 *Administration dose not documented Date/Time Rate/Dose/Volume Action   01/11/23 1209 *Not included in total MAR Hold   mirtazapine (REMERON) tablet 30 mg (mg)  Total dose: Cannot be calculated* *Administration dose not documented Date/Time Rate/Dose/Volume Action   01/11/23 1209 *Not included in total MAR Hold   pantoprazole (PROTONIX) EC tablet 40 mg (mg)  Total dose: Cannot be calculated* Dosing weight: 103.1 *Administration dose not documented Date/Time Rate/Dose/Volume Action   01/11/23 1209 *Not included in total MAR Hold   polyethylene glycol (MIRALAX / GLYCOLAX) packet 17 g (g)  Total dose:  Cannot be calculated* Dosing weight: 103.1 *Administration dose not documented Date/Time Rate/Dose/Volume Action   01/11/23 1209 *Not included in total MAR Hold   potassium chloride SA (KLOR-CON M) CR tablet 40 mEq (mEq)  Total dose: Cannot be calculated* Dosing weight: 106.1 *Administration dose not documented Date/Time Rate/Dose/Volume Action   01/11/23 1209 *Not included in total MAR Hold   pregabalin (LYRICA) capsule 100 mg (mg)  Total dose: Cannot be calculated* Dosing weight: 103.1 *Administration dose not documented Date/Time Rate/Dose/Volume Action   01/11/23 1209 *Not included in total MAR Hold   sodium chloride flush (NS) 0.9 % injection 3 mL (mL)  Total dose: Cannot be calculated* Dosing weight: 103.1 *Administration dose not documented Date/Time Rate/Dose/Volume Action   01/11/23 1209 *Not included in total MAR Hold   tiZANidine (ZANAFLEX) tablet 2 mg (mg)  Total dose: Cannot be calculated* Dosing weight: 103.1 *Administration dose not documented Date/Time Rate/Dose/Volume Action   01/11/23 1209 *Not included in total MAR Hold   Vilazodone HCl TABS 40 mg (mg)  Total dose: Cannot be calculated* *Administration dose not documented Date/Time Rate/Dose/Volume Action   01/11/23 1209 *Not  included in total MAR Hold    Sedation Time  Sedation Time Physician-1: 41 minutes 3 seconds Contrast     Administrations occurring from 1209 to 1328 on 01/11/23:  Medication Name Total Dose  iohexol (OMNIPAQUE) 350 MG/ML injection 60 mL   Radiation/Fluoro  Fluoro time: 11.1 (min) DAP: 25.7 (Gycm2) Cumulative Air Kerma: [id number] (mGy) Complications  Complications documented before study signed (01/11/2023  [id number] PM)   No complications were associated with this study.  Documented by Adonis Housekeeper, RT - 01/11/2023  1:11 PM     Coronary Findings  Diagnostic Dominance: Right Left Main  Ost LM to Mid LM lesion is 40% stenosed.    Left Anterior Descending  Prox LAD lesion is  100% stenosed.  Mid LAD to Dist LAD lesion is 50% stenosed.    First Diagonal Branch  Ost 1st Diag lesion is 90% stenosed.    Left Circumflex    First Obtuse Marginal Branch  Ost 1st Mrg lesion is 100% stenosed.    Right Coronary Artery  Vessel is large.  Prox RCA lesion is 60% stenosed.  Mid RCA lesion is 100% stenosed.    Right Posterior Atrioventricular Artery  Collaterals  RPAV filled by collaterals from 2nd Sept.      Saphenous Graft To Dist RCA  SVG graft was not injected.  Origin to Prox Graft lesion is 100% stenosed. The lesion was previously treated .    Saphenous Graft To 1st Diag  SVG graft was not injected.  Origin to Prox Graft lesion is 100% stenosed.    LIMA LIMA Graft To Mid LAD  LIMA graft was visualized by angiography.    Graft To 1st Mrg    Intervention   No interventions have been documented.   Coronary Diagrams  Diagnostic Dominance: Right  Intervention   Implants   No implant documentation for this case.   Syngo Images   Show images for CARDIAC CATHETERIZATION Images on Long Term Storage   Show images for Krishi, [place] to Procedure Log  Procedure Log    Hemo Data  Flowsheet Row Most Recent Value  Fick Cardiac Output 5.6 L/min  Fick Cardiac Output Index 2.45 (L/min)/BSA  RA A Wave 6 mmHg  RA V Wave 4 mmHg  RA Mean 1 mmHg  RV Systolic Pressure 30 mmHg  RV Diastolic Pressure -5 mmHg  RV EDP 3 mmHg  PA Systolic Pressure 33 mmHg  PA Diastolic Pressure 12 mmHg  PA Mean 24 mmHg  PW A Wave 11 mmHg  PW V Wave 8 mmHg  PW Mean 4 mmHg  AO Systolic Pressure 123456 mmHg  AO Diastolic Pressure 54 mmHg  AO Mean 72 mmHg  QP/QS 1  TPVR Index 9.79 HRUI  TSVR Index 29.38 HRUI  PVR SVR Ratio 0.28  TPVR/TSVR Ratio 0.33     Recent Labs: 01/08/2023: ALT 31 01/11/2023: Magnesium 2.3 01/12/2023: BUN 70; Creatinine, Ser 1.59; Hemoglobin 11.6; Platelets 391; Potassium 4.7; Sodium 135    Wt Readings from Last 3 Encounters:  01/11/23  101.7 kg  08/21/19 112.5 kg  09/03/18 105.2 kg    Assessment and Plan:   1. Severe Aortic Valve Stenosis: He has severe, stage D aortic valve stenosis. I have personally reviewed the echo images. NYHA class *** symptoms. The aortic valve is thickened, calcified with limited leaflet mobility. I think he would benefit from AVR. Given advanced age, he is not a good candidate for conventional AVR by surgical approach. I think he may  be a good candidate for TAVR.   I have reviewed the natural history of aortic stenosis with the patient and their family members  who are present today. We have discussed the limitations of medical therapy and the poor prognosis associated with symptomatic aortic stenosis. We have reviewed potential treatment options, including palliative medical therapy, conventional surgical aortic valve replacement, and transcatheter aortic valve replacement. We discussed treatment options in the context of the patient's specific comorbid medical conditions.   He would like to proceed with planning for TAVR. Risks and benefits of the valve procedure are reviewed with the patient. We will check a BMET today and if his renal function is stable I will arrange a cardiac CT, CTA of the chest/abdomen and pelvis and he will then be referred to see one of the CT surgeons on our TAVR team.     Labs/ tests ordered today include:  No orders of the defined types were placed in this encounter.  Disposition:   F/U with the valve team   Signed, Lauree Chandler, MD, [facility] 01/21/2023 12:59 PM    [address], [place], [place]  [address] Phone: [phone]; Fax: [phone]

## 2023-01-22 ENCOUNTER — Encounter: Payer: Self-pay | Admitting: Cardiovascular Disease

## 2023-01-22 ENCOUNTER — Ambulatory Visit: Payer: BC Managed Care – PPO | Attending: Cardiovascular Disease | Admitting: Cardiovascular Disease

## 2023-01-22 VITALS — BP 116/72 | HR 88 | Ht 74.0 in | Wt 229.0 lb

## 2023-01-22 DIAGNOSIS — Z01812 Encounter for preprocedural laboratory examination: Secondary | ICD-10-CM

## 2023-01-22 DIAGNOSIS — I350 Nonrheumatic aortic (valve) stenosis: Secondary | ICD-10-CM | POA: Diagnosis not present

## 2023-01-22 NOTE — Progress Notes (Addendum)
Pre Surgical Assessment: 5 M Walk Test  41M=16.8ft  5 Meter Walk Test- trial 1: 6.13 seconds 5 Meter Walk Test- trial 2: 6.38 seconds 5 Meter Walk Test- trial 3: 5.95 seconds 5 Meter Walk Test Average: 6.15 seconds  STS Risk Calculation:  Procedure Type: Isolated AVR PERIOPERATIVE OUTCOME ESTIMATE % Operative Mortality 2.55% Morbidity & Mortality 14.9% Stroke 2.75% Renal Failure 2.79% Reoperation 2.99% Prolonged Ventilation 6.14% Deep Sternal Wound Infection 0.228% [place] Hospital Stay (>14 days) 8.73% Short Hospital Stay (<6 days)* 35.9%

## 2023-01-22 NOTE — Patient Instructions (Signed)
Medication Instructions:  No changes *If you need a refill on your cardiac medications before your next appointment, please call your pharmacy*   Lab Work: Today: bmet  If you have labs (blood work) drawn today and your tests are completely normal, you will receive your results only by: [place] (if you have MyChart) OR A paper copy in the mail If you have any lab test that is abnormal or we need to change your treatment, we will call you to review the results.   Testing/Procedures: CT scan to be planned - we will call you and review instructions once arranged   Follow-Up: Per Structural Heart Team

## 2023-01-23 ENCOUNTER — Telehealth: Payer: Self-pay | Admitting: Physician Assistant

## 2023-01-23 LAB — BASIC METABOLIC PANEL
BUN/Creatinine Ratio: 29 — ABNORMAL HIGH (ref 10–24)
BUN: 50 mg/dL — ABNORMAL HIGH (ref 8–27)
CO2: 31 mmol/L — ABNORMAL HIGH (ref 20–29)
Calcium: 9.7 mg/dL (ref 8.6–10.2)
Chloride: 98 mmol/L (ref 96–106)
Creatinine, Ser: 1.75 mg/dL — ABNORMAL HIGH (ref 0.76–1.27)
Glucose: 210 mg/dL — ABNORMAL HIGH (ref 70–99)
Potassium: 5.9 mmol/L (ref 3.5–5.2)
Sodium: 140 mmol/L (ref 134–144)
eGFR: 40 mL/min/{1.73_m2} — ABNORMAL LOW (ref >59)

## 2023-01-23 NOTE — Telephone Encounter (Addendum)
Spoke to Dr. Jodene Nam (PCP) nurse. They are aware of situation, updated them on latest K+ level. Informed that we advised pt to have follow up blood work in next day/two. Also informed them that as of this morning pt had not started the Kayexalate yet. They will ensure pt gets follow up blood work this week.  Called and spoke to pt. He reports wife is picking up the medication this morning so that he may start the Kayexalate. Advised to stop Lisinopril. Advised to get follow up BMP at PCP. Patient verbalized understanding and agreeable to plan.   Faxed this note to PCP via epic

## 2023-01-23 NOTE — Telephone Encounter (Signed)
Call received for critical lab value of K 5.9.  It appears he has had issues with hyperkalemia recently.  Appears this lab was ordered yesterday by Dr. Angelena Form. His primary cardiologist is Dr. Phineas Inches.   I was able to reach the patient by phone this morning. He states he has had some intermittent numbness/tingling in his  hands and has felt unwell over the weekend. I advised if he is feeling unwell he should consider going to the ER for temporization. He does not think this is necessary. He states his PCP has prescribed a medication but he never got it from the pharmacy. It appears his PCP ordered kayexalate on 01/19/23. He states he will get this medication after work.  I advised him to get this medication ASAP and to not drive. I also asked him to reach out to his PCP for a repeat lab tomorrow (BMP). I offered to draw this in [place], but he prefers not to drive from [facility] if possible.  I also advised him to stop lisinopril. He expressed understanding of the plan.   I called the PCP office, but there was no on-call provider option.

## 2023-01-23 NOTE — Addendum Note (Signed)
Addended by: Stanton Kidney on: 01/23/2023 08:26 AM   Modules accepted: Orders

## 2023-01-24 ENCOUNTER — Other Ambulatory Visit: Payer: Self-pay

## 2023-01-24 DIAGNOSIS — I350 Nonrheumatic aortic (valve) stenosis: Secondary | ICD-10-CM

## 2023-01-24 MED ORDER — METOPROLOL TARTRATE 50 MG PO TABS: ORAL_TABLET | ORAL | 0 refills | Status: DC

## 2023-01-26 ENCOUNTER — Telehealth: Payer: Self-pay | Admitting: Cardiology

## 2023-01-26 DIAGNOSIS — Z0279 Encounter for issue of other medical certificate: Secondary | ICD-10-CM

## 2023-01-26 NOTE — Telephone Encounter (Signed)
Left message to call back  

## 2023-01-26 NOTE — Telephone Encounter (Signed)
Called pt he had several questions about his CT scan on 3/27. Answered his questions. Messaged Scarlette Calico, RN about this pt. Dylan Katz, RN added to chat, she needs to speak to pt about his upcoming appointment. Pt transferred to [place].

## 2023-01-26 NOTE — Telephone Encounter (Signed)
Pt would like a callback regarding him having a few questions before his testing on 3/27. Please advise.

## 2023-01-29 ENCOUNTER — Telehealth: Payer: Self-pay | Admitting: Cardiovascular Disease

## 2023-01-29 NOTE — Telephone Encounter (Signed)
Forms and payment were received on _3/25/24____ from patient. Placed in "MD's box."

## 2023-01-31 ENCOUNTER — Ambulatory Visit (HOSPITAL_COMMUNITY): Payer: BC Managed Care – PPO

## 2023-01-31 ENCOUNTER — Inpatient Hospital Stay (HOSPITAL_COMMUNITY): Admission: RE | Admit: 2023-01-31 | Payer: BC Managed Care – PPO | Source: Ambulatory Visit

## 2023-02-01 NOTE — Telephone Encounter (Signed)
Michalene said that Dr. Angelena Form would like for this form to be completed by the patient's primary cardiologist, Phineas Inches.  I have sent the form and R.O.I. to our [place] office.  We will keep the $29 payment here and  post it when the form is completed.

## 2023-02-06 ENCOUNTER — Ambulatory Visit (HOSPITAL_COMMUNITY)
Admission: RE | Admit: 2023-02-06 | Discharge: 2023-02-06 | Disposition: A | Discharge status: Home / Self Care | Payer: BC Managed Care – PPO | Source: Ambulatory Visit | Attending: Cardiovascular Disease | Admitting: Cardiovascular Disease

## 2023-02-06 ENCOUNTER — Encounter (HOSPITAL_COMMUNITY)
Admission: RE | Admit: 2023-02-06 | Discharge: 2023-02-06 | Disposition: A | Discharge status: Home / Self Care | Payer: BC Managed Care – PPO | Source: Ambulatory Visit | Attending: Cardiovascular Disease | Admitting: Cardiovascular Disease

## 2023-02-06 DIAGNOSIS — I350 Nonrheumatic aortic (valve) stenosis: Secondary | ICD-10-CM | POA: Diagnosis not present

## 2023-02-06 MED ORDER — SODIUM CHLORIDE 0.9 % WEIGHT BASED INFUSION: 1.0000 mL/kg/h | INTRAVENOUS | Status: DC

## 2023-02-06 MED ORDER — IOHEXOL 350 MG/ML SOLN
80.0000 mL | Freq: Once | INTRAVENOUS | Status: AC | PRN
  Administered 2023-02-06: 80 mL via INTRAVENOUS

## 2023-02-06 MED ORDER — SODIUM CHLORIDE 0.9 % WEIGHT BASED INFUSION
3.0000 mL/kg/h | INTRAVENOUS | Status: AC
  Administered 2023-02-06: 3 mL/kg/h via INTRAVENOUS

## 2023-02-06 NOTE — Progress Notes (Signed)
Notified Vaughan Basta in [place] that patient's 1 hour if IVF would be complete by 1040.

## 2023-02-08 ENCOUNTER — Encounter: Payer: Medicare Other | Admitting: Cardiothoracic Surgery

## 2023-02-08 ENCOUNTER — Institutional Professional Consult (permissible substitution) (INDEPENDENT_AMBULATORY_CARE_PROVIDER_SITE_OTHER): Payer: BC Managed Care – PPO | Admitting: Cardiothoracic Surgery

## 2023-02-08 VITALS — BP 118/77 | HR 81 | Resp 20 | Ht 74.0 in | Wt 229.0 lb

## 2023-02-08 DIAGNOSIS — I350 Nonrheumatic aortic (valve) stenosis: Secondary | ICD-10-CM | POA: Diagnosis not present

## 2023-02-08 NOTE — H&P (Signed)
[address]       [place] [address]             [phone]                    Dylan Bruce Medical Record [id number] Date of Birth: 1946-03-19  Referring: Dylan Bruce* Primary Care: Dylan Bott, MD Primary Cardiologist: Dylan Mayo, MD  Chief Complaint:    Chief Complaint  Patient presents with   Aortic Stenosis    Consult for TAVR    History of Present Illness:    Dylan Bruce 77 y.o. male referred in consultation for transcatheter aortic valve implantation.   Lives at home with wife and without a health aide.   At up 9 lb since Tuesday  Was in the hospital with this.   Her HPI per cardiology involves " 77 y.o. male with hx CAD s/p CABG, EF normal, had mid-distal latera/anterolateral wall motion abn. He has known occluded SVG-Diag. He presented with acute decompensated HFpEF with orthopnea/PND/ BNP 13,500 . His trop was significant 11K--> down to 4k. He is CP free. His rhythm is stable, sinus; wenckebach. Stopped BB. Planning on LHC/RHC today, now that he can lie at about 30 degrees. He has more severe AS, with gradient 35 mmhg and AVA 0.65 cm2. We discussed outpatient TAVR eval. If symptoms do not resolve and wedge not too high, can consider inpatient eval.   "  I have independently confirmed increasing symptoms of valvular heart disease   He's having more leg edema. He manages most meds with PCP. He doesn't follow as closely with cards he says Plan is call PCP And get this managed. He understands if much worse may need admission and inpatient TAVR   Past Medical History:  Diagnosis Date   Aortic stenosis    ARTHRITIS    ASTHMA    CAD (coronary artery disease)    CHF (congestive heart failure) ([place])    DM    GERD    HYPERLIPIDEMIA    Hypertension    PSORIASIS    SLEEP APNEA    SPINA BIFIDA     Past Surgical History:  Procedure Laterality Date   BACK SURGERY     CARPAL TUNNEL RELEASE Left  08/21/2019   Procedure: CARPAL TUNNEL RELEASE;  Surgeon: Daryll Brod, MD;  Location: [place] [facility];  Service: Orthopedics;  Laterality: Left;  AXILLARY BLOCK   CORONARY ARTERY BYPASS GRAFT     JOINT REPLACEMENT Left    shoulder   LEFT HEART CATH AND CORS/GRAFTS ANGIOGRAPHY N/A 06/18/2018   Procedure: LEFT HEART CATH AND CORS/GRAFTS ANGIOGRAPHY;  Surgeon: Lorretta Harp, MD;  Location: [facility];  Service: Cardiovascular;  Laterality: N/A;   RIGHT/LEFT HEART CATH AND CORONARY/GRAFT ANGIOGRAPHY N/A 01/11/2023   Procedure: RIGHT/LEFT HEART CATH AND CORONARY/GRAFT ANGIOGRAPHY;  Surgeon: Dylan Blanks, MD;  Location: [facility];  Service: Cardiovascular;  Laterality: N/A;   TRIGGER FINGER RELEASE Left 08/21/2019   Procedure: RELEASE TRIGGER LEFT SMALL FINGER LEFT INDEX;  Surgeon: Daryll Brod, MD;  Location: [facility];  Service: Orthopedics;  Laterality: Left;   ULNAR NERVE TRANSPOSITION Left 08/21/2019   Procedure: DECOMPRESSION WITH ULNAR NERVE LEFT CUBITAL TUNNEL ULNAR;  Surgeon: Daryll Brod, MD;  Location: [facility];  Service: Orthopedics;  Laterality: Left;    Family History  Problem Relation Age of Onset   Valvular  heart disease Father    Prostate cancer Father      Social History   Tobacco Use  Smoking Status Never  Smokeless Tobacco Never    Social History   Substance and Sexual Activity  Alcohol Use Not Currently     Allergies  Allergen Reactions   Cefuroxime Diarrhea and Nausea And Vomiting    Severe   Ibuprofen Anaphylaxis and Shortness Of Breath        Cefpodoxime Rash   Ciprofloxacin Rash   Doxycycline Rash   Sulfamethoxazole-Trimethoprim Rash   Fluocinolone Other (See Comments)    unknown   Probenecid Other (See Comments)    unknown   Allopurinol Rash   Azithromycin Rash   Sulfa Antibiotics Rash    Current Outpatient Medications  Medication Sig Dispense Refill   albuterol (PROVENTIL  HFA;VENTOLIN HFA) 108 (90 Base) MCG/ACT inhaler Inhale 2 puffs into the lungs every 4 (four) hours as needed for wheezing.     aspirin EC 81 MG EC tablet Take 1 tablet (81 mg total) by mouth daily. 90 tablet 3   atorvastatin (LIPITOR) 80 MG tablet Take 1 tablet (80 mg total) by mouth daily. 30 tablet 0   BYDUREON 2 MG SUSR Inject 2 mg into the skin once a week.     clobetasol cream (TEMOVATE) [id number] % Apply 1 application topically daily as needed (rash).      clopidogrel (PLAVIX) 75 MG tablet Take 1 tablet (75 mg total) by mouth daily. 90 tablet 3   COSENTYX SENSOREADY, 300 MG, 150 MG/ML SOAJ Inject 300 mg into the skin every 30 (thirty) days.     cyclobenzaprine (FLEXERIL) 10 MG tablet Take 10 mg by mouth at bedtime.      desonide (DESOWEN) 0.05 % cream Apply 1 application topically daily as needed (rash).      docusate sodium (COLACE) 100 MG capsule Take 100 mg by mouth daily.      esomeprazole (NEXIUM) 40 MG capsule Take 40 mg by mouth daily before breakfast.     ezetimibe (ZETIA) 10 MG tablet Take 10 mg by mouth daily.     FINACEA 15 % cream Apply 1 application topically 2 (two) times daily as needed (rash).      hydrocortisone (ANUCORT-HC) 25 MG suppository Place 1 suppository rectally 2 (two) times daily as needed for hemorrhoids.     hydrOXYzine (VISTARIL) 25 MG capsule TAKE ONE CAPSULE BY MOUTH EVERY 6 HOURS AS NEEDED     insulin lispro (HUMALOG KWIKPEN) 100 UNIT/ML KiwkPen Inject 18 Units into the skin 3 (three) times daily with meals.      isosorbide mononitrate (IMDUR) 30 MG 24 hr tablet Take 1 tablet (30 mg total) by mouth daily. Please schedule appointment for refills. 30 tablet 0   LANTUS SOLOSTAR 100 UNIT/ML Solostar Pen Inject 22 Units into the skin at bedtime.  5   LYRICA 100 MG capsule Take 1 capsule (100 mg total) by mouth 2 (two) times daily.  5   metoprolol tartrate (LOPRESSOR) 50 MG tablet Take as directed prior to 3/27 CT scan 1 tablet 0   mirtazapine (REMERON) 30 MG tablet  Take 1 tablet by mouth at bedtime.      nitroGLYCERIN (NITROSTAT) 0.4 MG SL tablet Place 1 tablet (0.4 mg total) under the tongue every 5 (five) minutes as needed for chest pain. (Patient taking differently: Place 1.2 mg under the tongue at bedtime.) 90 tablet 3   nystatin-triamcinolone (MYCOLOG II) cream Apply 1 application topically daily  as needed (rash).      oxyCODONE-acetaminophen (PERCOCET) 10-325 MG per tablet Take 1-2 tablets by mouth 2 (two) times daily as needed for pain.      polycarbophil (FIBERCON) 625 MG tablet Take 625 mg by mouth daily.     sodium fluoride (PREVIDENT) 1.1 % GEL dental gel Place 1 application onto teeth daily.      SOOLANTRA 1 % CREA Apply 1 application  topically daily as needed (rash).  3   tamsulosin (FLOMAX) 0.4 MG CAPS capsule Take 0.4 capsules by mouth daily.     torsemide (DEMADEX) 20 MG tablet Take 1 tablet (20 mg total) by mouth daily. (Take 2 tablets in case of weight gain of 2 to 3 lbs in 24 hrs or 5 lbs in 7 days. 30 tablet 0   ULORIC 40 MG tablet Take 1 tablet by mouth Daily.     VIIBRYD 40 MG TABS Take 40 mg by mouth Daily.      No current facility-administered medications for this visit.    ROS 14 point ROS reviewed and negative except as per HPI   PHYSICAL EXAMINATION: BP 118/77   Pulse 81   Resp 20   Ht 6\' 2"  (1.88 m)   Wt 229 lb (103.9 kg)   SpO2 92% Comment: RA  BMI 29.40 kg/m   Gen: NAD Neuro: Alert and oriented CV: + systolic murmur Resp: Nonlaboured Abd: Soft, ntnd Extr: WWP, 3+ LE edema  Diagnostic Studies & Laboratory data:     Recent Radiology Findings:   CT CORONARY MORPH W/CTA COR W/SCORE W/CA W/CM &/OR WO/CM  Addendum Date: 02/07/2023   ADDENDUM REPORT: 02/07/2023 10:07 CLINICAL DATA:  Aortic valve replacement (TAVR), pre-op eval EXAM: Cardiac TAVR CT TECHNIQUE: The patient was scanned on a Siemens Force [id number] slice scanner. A 120 kV retrospective scan was triggered in the descending thoracic aorta at 111 HU's. Gantry  rotation speed was 270 msecs and collimation was .9 mm. The 3D data set was reconstructed in 5% intervals of the R-R cycle. Systolic and diastolic phases were analyzed on a dedicated work station using MPR, MIP and VRT modes. The patient received 1mL OMNIPAQUE IOHEXOL 350 MG/ML SOLN of contrast. 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 Severely thickened and severely calcified aortic valve cusps. AV calcium score: 6409 Virtual Basal Annulus Measurements: Measured at 15% R-R and 25% R-R, similar valve size range. Maximum/Minimum Diameter: 30.7 x 25.3 mm Perimeter: 86.9 mm Area:  578 mm2 No significant LVOT calcifications. Membranous septal length: 7 mm Based on these measurements, the annulus would be suitable for a 29 mm Sapien 3 valve. Alternatively, Heart Team can consider 34 mm Evolut valve. Recommend Heart Team discussion for valve selection. Sinus of Valsalva Measurements: Non-coronary:  40 mm Right - coronary:  39 mm Left - coronary:  39 mm Sinus of Valsalva Height: Left: 26 mm Right: 24 mm Aorta: Common origin of the brachiocephalic and left common carotid arteries off the aortic arch. Sinotubular Junction:  35 mm Ascending Thoracic Aorta:  40 mm Aortic Arch:  33 mm Descending Thoracic Aorta: 29 mm Coronary Artery Height above Annulus: Left main: 20 mm Right coronary: 20 mm Coronary Arteries: Normal coronary origin. Right dominance. Prior CABG. 3 vessel native CAD. LIMA to LAD patent. SVG to OM patent. SVG to RCA appears stented and occluded distal to stent. SVG to diagonal occluded. Optimum Fluoroscopic Angle for Delivery: RAO 2 CRA 1 OTHER: Fatty metaplasia of the subendocardial left  ventricular lateral wall and apex consistent with prior myocardial infarction. See screen captures. Atria: Grossly normal Left atrial appendage: No thrombus.  Small caliber. Mitral valve: Grossly normal, no mitral annular calcifications, trivial anterior leaflet calcification.  Pulmonary artery: Moderately dilated, 38 mm. Pulmonary veins: Normal anatomy. IMPRESSION: 1. Tricuspid aortic valve with severely reduced cusp excursion. Severely thickened and severely calcified aortic valve cusps. 2. Aortic valve calcium score: 6409 3. Annulus area: 578 mm2, suitable for 29 mm Sapien 3 valve. No LVOT calcifications. Membranous septal length 7 mm. 4. Sufficient coronary artery heights from annulus. 5. Optimum fluoroscopic angle for delivery:  RAO 2 CRA 1 Electronically Signed   By: Cherlynn Kaiser M.D.   On: 02/07/2023 10:07   Result Date: 02/07/2023 EXAM: OVER-READ INTERPRETATION  CT CHEST The following report is a limited chest CT over-read performed by radiologist Dr. Yetta Glassman of [facility], [place] on 02/06/2023. This over-read does not include interpretation of cardiac or coronary anatomy or pathology. The cardiac TAVR interpretation by the cardiologist is attached. COMPARISON:  None Available. FINDINGS: Extracardiac findings will be described separately under dictation for contemporaneously obtained CTA chest, abdomen and pelvis. IMPRESSION: Please see separate dictation for contemporaneously obtained CTA chest, abdomen and pelvis dated 02/06/2023 for full description of relevant extracardiac findings. Electronically Signed: By: Yetta Glassman M.D. On: 02/06/2023 12:30   CT ANGIO ABDOMEN PELVIS  W &/OR WO CONTRAST  Result Date: 02/06/2023 CLINICAL DATA:  Aortic valve replacement preop evaluation EXAM: CT ANGIOGRAPHY CHEST, ABDOMEN AND PELVIS TECHNIQUE: Non-contrast CT of the chest was initially obtained. Multidetector CT imaging through the chest, abdomen and pelvis was performed using the standard protocol during bolus administration of intravenous contrast. Multiplanar reconstructed images and MIPs were obtained and reviewed to evaluate the vascular anatomy. RADIATION DOSE REDUCTION: This exam was performed according to the departmental dose-optimization program which  includes automated exposure control, adjustment of the mA and/or kV according to patient size and/or use of iterative reconstruction technique. CONTRAST:  18mL OMNIPAQUE IOHEXOL 350 MG/ML SOLN COMPARISON:  CT abdomen and pelvis dated November 23, 2018 FINDINGS: CTA CHEST FINDINGS Cardiovascular: Cardiomegaly. No pericardial effusion. Normal caliber thoracic aorta with mild atherosclerotic disease. Aortic valve thickening and calcifications. Main and three-vessel coronary artery calcifications status post CABG. Patent LIMA graft. Subendocardial fat deposition of the left ventricular lateral wall, likely due to prior infarct. Mediastinum/Nodes: Esophagus is unremarkable. Left-sided thyroid nodule measuring 2.5 cm. No enlarged lymph nodes seen in the chest. Lungs/Pleura: Central airways are patent. Bilateral mosaic attenuation. No consolidation, pleural effusion or pneumothorax. Scattered small solid pulmonary nodules. Reference nodule of the right upper lobe measuring 4 mm on series 9, image 44. Musculoskeletal: Prior median sternotomy. Prior total left shoulder replacement. No aggressive appearing osseous lesions. CTA ABDOMEN AND PELVIS FINDINGS Hepatobiliary: No focal liver abnormality is seen. No gallstones, gallbladder wall thickening, or biliary dilatation. Pancreas: Unremarkable. No pancreatic ductal dilatation or surrounding inflammatory changes. Spleen: No splenic injury or perisplenic hematoma. Adrenals/Urinary Tract: Bilateral adrenal glands are unremarkable. Hydronephrosis or nephrolithiasis. Indeterminate (HU 30) exophytic lesion of the anterior left kidney measuring 1.5 cm on series 8, image 147, previously measured 1.0 cm. Additional bilateral low-attenuation renal lesions, largest are compatible with simple cysts, others are too small to accurately characterize, no specific follow-up imaging is recommended. Bladder is unremarkable. Stomach/Bowel: Stomach is within normal limits. Appendix appears normal.  Diverticulosis. No evidence of bowel wall thickening, distention, or inflammatory changes. Vascular/lymphatic: Normal caliber abdominal aorta with moderate atherosclerotic disease. Severe narrowing of  the proximal SMA due to calcified and noncalcified plaque. No pathologically enlarged lymph nodes seen in the abdomen or pelvis. Reproductive: Prostate is unremarkable. Other: Small ventral abdominal wall hernia containing fat a single nondilated loop of small bowel. No abdominopelvic ascites. Musculoskeletal: Dextrocurvature of the lumbar spine. Old left-sided rib fractures. No aggressive appearing osseous lesions. VASCULAR MEASUREMENTS PERTINENT TO TAVR: AORTA: Minimal Aortic Diameter-14.1 mm Severity of Aortic Calcification-moderate RIGHT PELVIS: Right Common Iliac Artery - Minimal Diameter-5.1 mm Tortuosity-moderate Calcification-severe Right External Iliac Artery - Minimal Diameter-5.8 mm Tortuosity-moderate Calcification-mild Right Common Femoral Artery - Minimal Diameter-5.0 mm Tortuosity-none Calcification-severe LEFT PELVIS: Left Common Iliac Artery - Minimal Diameter-7.0 mm Tortuosity-mild Calcification-severe Left External Iliac Artery - Minimal Diameter-7.3 mm Tortuosity-moderate Calcification-mild Left Common Femoral Artery - Minimal Diameter-6.5 mm Tortuosity-none Calcification-moderate Review of the MIP images confirms the above findings. IMPRESSION: Vascular: 1. Vascular findings and measurements pertinent to potential TAVR procedure, as detailed above. 2. Thickening and calcification of the aortic valve, compatible with reported clinical history of aortic stenosis. 3. 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 Severe narrowing of the proximal SMA due to calcified and noncalcified plaque. 4. Left main and 3 vessel coronary artery disease. Nonvascular: 1. Left-sided thyroid nodule measuring 2.5 cm. Recommend thyroid ultrasound further evaluation. 2. Indeterminate lesion of the anterior left kidney is  increased in size when compared with prior exam. Recommend renal protocol MRI for further evaluation. 3. Bilateral mosaic attenuation, likely due to air trapping which can be seen in the setting of small airways disease. 4. Scattered small solid pulmonary nodules measuring up to 4 mm. No follow-up needed if patient is low-risk. Non-contrast chest CT can be considered in 12 months if patient is high-risk. This recommendation follows the consensus statement: Guidelines for Management of Incidental Pulmonary Nodules Detected on CT Images: From the Fleischner Society 2017; Radiology 2017; 284:228-243. Electronically Signed   By: Yetta Glassman M.D.   On: 02/06/2023 13:50   CT ANGIO CHEST AORTA W/CM & OR WO/CM  Result Date: 02/06/2023 CLINICAL DATA:  Aortic valve replacement preop evaluation EXAM: CT ANGIOGRAPHY CHEST, ABDOMEN AND PELVIS TECHNIQUE: Non-contrast CT of the chest was initially obtained. Multidetector CT imaging through the chest, abdomen and pelvis was performed using the standard protocol during bolus administration of intravenous contrast. Multiplanar reconstructed images and MIPs were obtained and reviewed to evaluate the vascular anatomy. RADIATION DOSE REDUCTION: This exam was performed according to the departmental dose-optimization program which includes automated exposure control, adjustment of the mA and/or kV according to patient size and/or use of iterative reconstruction technique. CONTRAST:  86mL OMNIPAQUE IOHEXOL 350 MG/ML SOLN COMPARISON:  CT abdomen and pelvis dated November 23, 2018 FINDINGS: CTA CHEST FINDINGS Cardiovascular: Cardiomegaly. No pericardial effusion. Normal caliber thoracic aorta with mild atherosclerotic disease. Aortic valve thickening and calcifications. Main and three-vessel coronary artery calcifications status post CABG. Patent LIMA graft. Subendocardial fat deposition of the left ventricular lateral wall, likely due to prior infarct. Mediastinum/Nodes: Esophagus is  unremarkable. Left-sided thyroid nodule measuring 2.5 cm. No enlarged lymph nodes seen in the chest. Lungs/Pleura: Central airways are patent. Bilateral mosaic attenuation. No consolidation, pleural effusion or pneumothorax. Scattered small solid pulmonary nodules. Reference nodule of the right upper lobe measuring 4 mm on series 9, image 44. Musculoskeletal: Prior median sternotomy. Prior total left shoulder replacement. No aggressive appearing osseous lesions. CTA ABDOMEN AND PELVIS FINDINGS Hepatobiliary: No focal liver abnormality is seen. No gallstones, gallbladder wall thickening, or biliary dilatation. Pancreas: Unremarkable. No pancreatic ductal dilatation or surrounding inflammatory changes.  Spleen: No splenic injury or perisplenic hematoma. Adrenals/Urinary Tract: Bilateral adrenal glands are unremarkable. Hydronephrosis or nephrolithiasis. Indeterminate (HU 30) exophytic lesion of the anterior left kidney measuring 1.5 cm on series 8, image 147, previously measured 1.0 cm. Additional bilateral low-attenuation renal lesions, largest are compatible with simple cysts, others are too small to accurately characterize, no specific follow-up imaging is recommended. Bladder is unremarkable. Stomach/Bowel: Stomach is within normal limits. Appendix appears normal. Diverticulosis. No evidence of bowel wall thickening, distention, or inflammatory changes. Vascular/lymphatic: Normal caliber abdominal aorta with moderate atherosclerotic disease. Severe narrowing of the proximal SMA due to calcified and noncalcified plaque. No pathologically enlarged lymph nodes seen in the abdomen or pelvis. Reproductive: Prostate is unremarkable. Other: Small ventral abdominal wall hernia containing fat a single nondilated loop of small bowel. No abdominopelvic ascites. Musculoskeletal: Dextrocurvature of the lumbar spine. Old left-sided rib fractures. No aggressive appearing osseous lesions. VASCULAR MEASUREMENTS PERTINENT TO TAVR:  AORTA: Minimal Aortic Diameter-14.1 mm Severity of Aortic Calcification-moderate RIGHT PELVIS: Right Common Iliac Artery - Minimal Diameter-5.1 mm Tortuosity-moderate Calcification-severe Right External Iliac Artery - Minimal Diameter-5.8 mm Tortuosity-moderate Calcification-mild Right Common Femoral Artery - Minimal Diameter-5.0 mm Tortuosity-none Calcification-severe LEFT PELVIS: Left Common Iliac Artery - Minimal Diameter-7.0 mm Tortuosity-mild Calcification-severe Left External Iliac Artery - Minimal Diameter-7.3 mm Tortuosity-moderate Calcification-mild Left Common Femoral Artery - Minimal Diameter-6.5 mm Tortuosity-none Calcification-moderate Review of the MIP images confirms the above findings. IMPRESSION: Vascular: 1. Vascular findings and measurements pertinent to potential TAVR procedure, as detailed above. 2. Thickening and calcification of the aortic valve, compatible with reported clinical history of aortic stenosis. 3. 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 Severe narrowing of the proximal SMA due to calcified and noncalcified plaque. 4. Left main and 3 vessel coronary artery disease. Nonvascular: 1. Left-sided thyroid nodule measuring 2.5 cm. Recommend thyroid ultrasound further evaluation. 2. Indeterminate lesion of the anterior left kidney is increased in size when compared with prior exam. Recommend renal protocol MRI for further evaluation. 3. Bilateral mosaic attenuation, likely due to air trapping which can be seen in the setting of small airways disease. 4. Scattered small solid pulmonary nodules measuring up to 4 mm. No follow-up needed if patient is low-risk. Non-contrast chest CT can be considered in 12 months if patient is high-risk. This recommendation follows the consensus statement: Guidelines for Management of Incidental Pulmonary Nodules Detected on CT Images: From the Fleischner Society 2017; Radiology 2017; 284:228-243. Electronically Signed   By: Yetta Glassman M.D.   On:  02/06/2023 13:50   CARDIAC CATHETERIZATION  Result Date: 01/11/2023   Prox LAD lesion is 100% stenosed.   Mid RCA lesion is 100% stenosed.   Origin to Prox Graft lesion is 100% stenosed.   Origin to Prox Graft lesion is 100% stenosed.   Ost 1st Diag lesion is 90% stenosed.   Ost 1st Mrg lesion is 100% stenosed.   Prox RCA lesion is 60% stenosed.   Mid LAD to Dist LAD lesion is 50% stenosed.   Ost LM to Mid LM lesion is 40% stenosed.   SVG graft was not injected.   SVG graft was not injected.   LIMA graft was visualized by angiography. Chronic total occlusion of the mid LAD. The mid and distal LAD has diffuse moderate disease and fills from the patent LIMA graft.  The small to moderate caliber diagonal branch has diffuse severe disease, unchanged from last cath. The SVG to the diagonal is known to be occluded. The Circumflex has mild proximal stenosis. The first  OM branch is chronically occluded and fills from the patent SVG graft. The large dominant RCA has chronic total occlusion in the mid vessel. The SVG to the distal RCA is known to be occluded. The distal RCA branches fill from left to right collaterals. RA 1 RV 30/0/3 PA 33/12 mean 24 PCWP 4 AO 105/54 Recommendations: Continue medical management of CAD. Diffuse three vessel CAD with continued patency of the LIMA to LAD and SVG to OM. No focal targets for PCI. Normal right heart pressures. Will continue workup for TAVR. He will likely need to have his Pre-TAVR CT scans staged a week from now given the contrast load today. He can be discharged home tomorrow if stable and we can see him in our structural heart office to discuss TAVR in detail and plan his scans.       I have independently reviewed the above radiology studies  and reviewed the findings with the patient.   Recent Lab Findings:    Assessment / Plan:   Dylan Bruce 77 y.o. male referred in consultation for transcatheter aortic valve implantation  + Severe, symptomatic aortic  stenosis.  Meets criteria for AV replacement.  STS risk is 2.55%  Risks/benefits/alternatives of TAVR were discussed at length (90% standard recovery, 6-9% morbidity [any organ, typically access site vascular complication needing surgery, stroke, or pacemaker] and <1% mortality. Options are TAVR, SAVR or medical treatment. Patient understands SAVR is typically higher risk and takes longer recovery than TAVR in elderly patients. As well, discussed that medical Tx yields around 50% mortality for aortic stenosis in 2 years if left untreated, and this is an option.   I told him may need a pacemaker given recent Mobitz I and II during recent hospitalization.     Valve: 38mm Sapien 3 (Area 558mm2 ) Bailout: Yes Access:  Transfemoral NYHA:II    Misc/procedural: Perhaps Left leg for access, better vessels.  The patient was counseled at length regarding treatment alternatives for management of severe symptomatic aortic stenosis. The risks and benefits of surgical intervention has been discussed in detail. Long-term prognosis with medical therapy was discussed. Alternative approaches such as conventional surgical aortic valve replacement, transcatheter aortic valve replacement, and palliative medical therapy were compared and contrasted at length. This discussion was placed in the context of the patient's own specific clinical presentation and past medical history. All of her questions have been addressed.   Dylan Bruce 02/08/2023 3:31 PM

## 2023-02-13 ENCOUNTER — Other Ambulatory Visit: Payer: Self-pay

## 2023-02-13 MED ORDER — PREDNISONE 50 MG PO TABS: ORAL_TABLET | ORAL | 0 refills | Status: DC

## 2023-02-14 ENCOUNTER — Telehealth: Payer: Self-pay

## 2023-02-14 NOTE — Telephone Encounter (Signed)
Pt called into the office due to weight gain, lower extremity edema, SOB with activity and when lying down at night.  Last night the pt had to sleep in his recliner due to SOB. When I spoke with the pt yesterday he was felling okay and stated that he was down 7 lbs from his visit with Dr Delia Chimes.  Last week the pt was up 9 lbs at his appointment with Dr Delia Chimes on 4/4 and then he saw his PCP on 4/5 and since then the patient has been doubling up on his Torsemide. The pt did have lab work drawn on 4/5 and Creatinine was 2.0.  I asked the patient if he could come into the office today and have lab work to check his kidney function given the higher dosage of torsemide.  The pt cannot come to [facility] and plans to contact his PCP to see if labs can be drawn today.  I advised the pt that due to his symptoms he may need to be evaluated prior to his upcoming TAVR on 4/17. While speaking with the patient on the phone he is not SOB and today he is still up 2 lbs from his baseline weight. Will await lab results.

## 2023-02-15 ENCOUNTER — Telehealth: Payer: Self-pay | Admitting: Cardiology

## 2023-02-15 NOTE — Telephone Encounter (Signed)
Patient called today regarding labs drawn yesterday. He is scheduled for TAVR next week, 4/17. Patient states that he recently had a 9lb weight gain with associated SOB and has been intermittently doubling his torsemide. Cr appears stable at his baseline at 1.90. BNP elevated >9000 but more recently has been anywhere from 2000-5000. His symptoms are improved today with no SOB and his weight is actually below his dry weight (220lb) at 218lb this AM. Instructed him to continue taking 20mg  Torsemide daily and increase this PRN to 40mg  if weight >3lb overnight. He is scheduled for PAT labs Monday. There had been discussion about potentially admitting him early for IV diuretics if needed prior to TAVR although based on how he feels today, would hold off for now. Instructed to call the main [facility] office for on-call coverage if needed over the weekend and we will be in touch early next week.   Patient also asking about timing of Prednisone. He was instructed to take 50mg  Prednisone at 1930 on 4/16, 0130 on 4/17 and we will give the last dose on arrival for his procedure. Patient verbalized understanding.   Georgie Chard NP-C Structural Heart Team  Pager: [phone] Phone: [phone]

## 2023-02-16 ENCOUNTER — Other Ambulatory Visit: Payer: Self-pay

## 2023-02-16 DIAGNOSIS — I350 Nonrheumatic aortic (valve) stenosis: Secondary | ICD-10-CM

## 2023-02-19 ENCOUNTER — Telehealth: Payer: Self-pay

## 2023-02-19 ENCOUNTER — Ambulatory Visit (HOSPITAL_COMMUNITY)
Admission: RE | Admit: 2023-02-19 | Discharge: 2023-02-19 | Disposition: A | Discharge status: Home / Self Care | Payer: Medicare Other | Source: Ambulatory Visit | Attending: Cardiovascular Disease | Admitting: Cardiovascular Disease

## 2023-02-19 ENCOUNTER — Encounter (HOSPITAL_COMMUNITY)
Admission: RE | Admit: 2023-02-19 | Discharge: 2023-02-19 | Disposition: A | Discharge status: Home / Self Care | Payer: BC Managed Care – PPO | Source: Ambulatory Visit | Attending: Cardiovascular Disease | Admitting: Cardiovascular Disease

## 2023-02-19 ENCOUNTER — Other Ambulatory Visit: Payer: Self-pay

## 2023-02-19 ENCOUNTER — Inpatient Hospital Stay (HOSPITAL_COMMUNITY)
Admission: EM | Admit: 2023-02-19 | Discharge: 2023-02-22 | DRG: 266 | Disposition: A | Discharge status: Home / Self Care | Payer: BC Managed Care – PPO | Attending: Cardiovascular Disease | Admitting: Cardiovascular Disease

## 2023-02-19 DIAGNOSIS — Z7902 Long term (current) use of antithrombotics/antiplatelets: Secondary | ICD-10-CM

## 2023-02-19 DIAGNOSIS — Z01818 Encounter for other preprocedural examination: Secondary | ICD-10-CM | POA: Insufficient documentation

## 2023-02-19 DIAGNOSIS — Z1152 Encounter for screening for COVID-19: Secondary | ICD-10-CM | POA: Insufficient documentation

## 2023-02-19 DIAGNOSIS — I440 Atrioventricular block, first degree: Secondary | ICD-10-CM | POA: Insufficient documentation

## 2023-02-19 DIAGNOSIS — Z7984 Long term (current) use of oral hypoglycemic drugs: Secondary | ICD-10-CM

## 2023-02-19 DIAGNOSIS — R918 Other nonspecific abnormal finding of lung field: Secondary | ICD-10-CM | POA: Diagnosis present

## 2023-02-19 DIAGNOSIS — I25810 Atherosclerosis of coronary artery bypass graft(s) without angina pectoris: Secondary | ICD-10-CM | POA: Diagnosis present

## 2023-02-19 DIAGNOSIS — I509 Heart failure, unspecified: Principal | ICD-10-CM

## 2023-02-19 DIAGNOSIS — I350 Nonrheumatic aortic (valve) stenosis: Secondary | ICD-10-CM | POA: Insufficient documentation

## 2023-02-19 DIAGNOSIS — Z006 Encounter for examination for normal comparison and control in clinical research program: Secondary | ICD-10-CM | POA: Diagnosis not present

## 2023-02-19 DIAGNOSIS — I441 Atrioventricular block, second degree: Secondary | ICD-10-CM | POA: Diagnosis not present

## 2023-02-19 DIAGNOSIS — L4050 Arthropathic psoriasis, unspecified: Secondary | ICD-10-CM | POA: Diagnosis present

## 2023-02-19 DIAGNOSIS — I2510 Atherosclerotic heart disease of native coronary artery without angina pectoris: Secondary | ICD-10-CM | POA: Diagnosis not present

## 2023-02-19 DIAGNOSIS — I5033 Acute on chronic diastolic (congestive) heart failure: Secondary | ICD-10-CM | POA: Diagnosis present

## 2023-02-19 DIAGNOSIS — Z882 Allergy status to sulfonamides status: Secondary | ICD-10-CM

## 2023-02-19 DIAGNOSIS — Z888 Allergy status to other drugs, medicaments and biological substances status: Secondary | ICD-10-CM

## 2023-02-19 DIAGNOSIS — E1169 Type 2 diabetes mellitus with other specified complication: Secondary | ICD-10-CM | POA: Diagnosis present

## 2023-02-19 DIAGNOSIS — Z952 Presence of prosthetic heart valve: Secondary | ICD-10-CM

## 2023-02-19 DIAGNOSIS — F32A Depression, unspecified: Secondary | ICD-10-CM | POA: Diagnosis present

## 2023-02-19 DIAGNOSIS — I2489 Other forms of acute ischemic heart disease: Secondary | ICD-10-CM | POA: Diagnosis present

## 2023-02-19 DIAGNOSIS — E1140 Type 2 diabetes mellitus with diabetic neuropathy, unspecified: Secondary | ICD-10-CM | POA: Diagnosis present

## 2023-02-19 DIAGNOSIS — I480 Paroxysmal atrial fibrillation: Secondary | ICD-10-CM | POA: Diagnosis present

## 2023-02-19 DIAGNOSIS — I130 Hypertensive heart and chronic kidney disease with heart failure and stage 1 through stage 4 chronic kidney disease, or unspecified chronic kidney disease: Secondary | ICD-10-CM | POA: Diagnosis present

## 2023-02-19 DIAGNOSIS — R911 Solitary pulmonary nodule: Secondary | ICD-10-CM | POA: Diagnosis present

## 2023-02-19 DIAGNOSIS — E1122 Type 2 diabetes mellitus with diabetic chronic kidney disease: Secondary | ICD-10-CM | POA: Diagnosis not present

## 2023-02-19 DIAGNOSIS — Z951 Presence of aortocoronary bypass graft: Secondary | ICD-10-CM | POA: Diagnosis not present

## 2023-02-19 DIAGNOSIS — E119 Type 2 diabetes mellitus without complications: Secondary | ICD-10-CM | POA: Diagnosis not present

## 2023-02-19 DIAGNOSIS — Z7901 Long term (current) use of anticoagulants: Secondary | ICD-10-CM

## 2023-02-19 DIAGNOSIS — I252 Old myocardial infarction: Secondary | ICD-10-CM

## 2023-02-19 DIAGNOSIS — N1832 Chronic kidney disease, stage 3b: Secondary | ICD-10-CM | POA: Diagnosis not present

## 2023-02-19 DIAGNOSIS — I10 Essential (primary) hypertension: Secondary | ICD-10-CM | POA: Diagnosis present

## 2023-02-19 DIAGNOSIS — Z881 Allergy status to other antibiotic agents status: Secondary | ICD-10-CM

## 2023-02-19 DIAGNOSIS — R410 Disorientation, unspecified: Secondary | ICD-10-CM | POA: Diagnosis not present

## 2023-02-19 DIAGNOSIS — R339 Retention of urine, unspecified: Secondary | ICD-10-CM | POA: Diagnosis present

## 2023-02-19 DIAGNOSIS — J45909 Unspecified asthma, uncomplicated: Secondary | ICD-10-CM | POA: Diagnosis present

## 2023-02-19 DIAGNOSIS — E041 Nontoxic single thyroid nodule: Secondary | ICD-10-CM | POA: Diagnosis present

## 2023-02-19 DIAGNOSIS — K219 Gastro-esophageal reflux disease without esophagitis: Secondary | ICD-10-CM | POA: Diagnosis present

## 2023-02-19 DIAGNOSIS — Z8042 Family history of malignant neoplasm of prostate: Secondary | ICD-10-CM

## 2023-02-19 DIAGNOSIS — Q059 Spina bifida, unspecified: Secondary | ICD-10-CM

## 2023-02-19 DIAGNOSIS — E1151 Type 2 diabetes mellitus with diabetic peripheral angiopathy without gangrene: Secondary | ICD-10-CM | POA: Diagnosis present

## 2023-02-19 DIAGNOSIS — Z7985 Long-term (current) use of injectable non-insulin antidiabetic drugs: Secondary | ICD-10-CM

## 2023-02-19 DIAGNOSIS — E785 Hyperlipidemia, unspecified: Secondary | ICD-10-CM | POA: Diagnosis present

## 2023-02-19 DIAGNOSIS — Z91041 Radiographic dye allergy status: Secondary | ICD-10-CM

## 2023-02-19 DIAGNOSIS — F419 Anxiety disorder, unspecified: Secondary | ICD-10-CM | POA: Diagnosis present

## 2023-02-19 DIAGNOSIS — Z8249 Family history of ischemic heart disease and other diseases of the circulatory system: Secondary | ICD-10-CM

## 2023-02-19 DIAGNOSIS — Z794 Long term (current) use of insulin: Secondary | ICD-10-CM | POA: Diagnosis not present

## 2023-02-19 DIAGNOSIS — I484 Atypical atrial flutter: Secondary | ICD-10-CM | POA: Diagnosis not present

## 2023-02-19 DIAGNOSIS — Z79899 Other long term (current) drug therapy: Secondary | ICD-10-CM

## 2023-02-19 DIAGNOSIS — Z7982 Long term (current) use of aspirin: Secondary | ICD-10-CM

## 2023-02-19 DIAGNOSIS — Z96612 Presence of left artificial shoulder joint: Secondary | ICD-10-CM | POA: Diagnosis present

## 2023-02-19 DIAGNOSIS — G4730 Sleep apnea, unspecified: Secondary | ICD-10-CM | POA: Diagnosis present

## 2023-02-19 LAB — CBC
HCT: 38.6 % — ABNORMAL LOW (ref 39.0–52.0)
HCT: 36.5 % — ABNORMAL LOW (ref 39.0–52.0)
Hemoglobin: 11.4 g/dL — ABNORMAL LOW (ref 13.0–17.0)
Hemoglobin: 11.2 g/dL — ABNORMAL LOW (ref 13.0–17.0)
MCH: 25.3 pg — ABNORMAL LOW (ref 26.0–34.0)
MCH: 26 pg (ref 26.0–34.0)
MCHC: 29.5 g/dL — ABNORMAL LOW (ref 30.0–36.0)
MCHC: 30.7 g/dL (ref 30.0–36.0)
MCV: 85.6 fL (ref 80.0–100.0)
MCV: 84.9 fL (ref 80.0–100.0)
Platelets: 505 10*3/uL — ABNORMAL HIGH (ref 150–400)
Platelets: 469 10*3/uL — ABNORMAL HIGH (ref 150–400)
RBC: 4.51 MIL/uL (ref 4.22–5.81)
RBC: 4.3 MIL/uL (ref 4.22–5.81)
RDW: 19.9 % — ABNORMAL HIGH (ref 11.5–15.5)
RDW: 20 % — ABNORMAL HIGH (ref 11.5–15.5)
WBC: 9.3 10*3/uL (ref 4.0–10.5)
WBC: 12.9 10*3/uL — ABNORMAL HIGH (ref 4.0–10.5)
nRBC: 0 % (ref 0.0–0.2)
nRBC: 0 % (ref 0.0–0.2)

## 2023-02-19 LAB — URINALYSIS, ROUTINE W REFLEX MICROSCOPIC
Bilirubin Urine: NEGATIVE
Glucose, UA: 150 mg/dL — AB
Hgb urine dipstick: NEGATIVE
Ketones, ur: NEGATIVE mg/dL
Leukocytes,Ua: NEGATIVE
Nitrite: NEGATIVE
Protein, ur: NEGATIVE mg/dL
Specific Gravity, Urine: 1.009 (ref 1.005–1.030)
pH: 5 (ref 5.0–8.0)

## 2023-02-19 LAB — COMPREHENSIVE METABOLIC PANEL
ALT: 49 U/L — ABNORMAL HIGH (ref 0–44)
ALT: 48 U/L — ABNORMAL HIGH (ref 0–44)
AST: 35 U/L (ref 15–41)
AST: 39 U/L (ref 15–41)
Albumin: 3.3 g/dL — ABNORMAL LOW (ref 3.5–5.0)
Albumin: 3.2 g/dL — ABNORMAL LOW (ref 3.5–5.0)
Alkaline Phosphatase: 112 U/L (ref 38–126)
Alkaline Phosphatase: 111 U/L (ref 38–126)
Anion gap: 10 (ref 5–15)
Anion gap: 13 (ref 5–15)
BUN: 40 mg/dL — ABNORMAL HIGH (ref 8–23)
BUN: 44 mg/dL — ABNORMAL HIGH (ref 8–23)
CO2: 25 mmol/L (ref 22–32)
CO2: 22 mmol/L (ref 22–32)
Calcium: 9.1 mg/dL (ref 8.9–10.3)
Calcium: 8.9 mg/dL (ref 8.9–10.3)
Chloride: 106 mmol/L (ref 98–111)
Chloride: 102 mmol/L (ref 98–111)
Creatinine, Ser: 1.54 mg/dL — ABNORMAL HIGH (ref 0.61–1.24)
Creatinine, Ser: 1.75 mg/dL — ABNORMAL HIGH (ref 0.61–1.24)
GFR, Estimated: 47 mL/min — ABNORMAL LOW (ref >60)
GFR, Estimated: 40 mL/min — ABNORMAL LOW (ref >60)
Glucose, Bld: 164 mg/dL — ABNORMAL HIGH (ref 70–99)
Glucose, Bld: 280 mg/dL — ABNORMAL HIGH (ref 70–99)
Potassium: 3.8 mmol/L (ref 3.5–5.1)
Potassium: 3.9 mmol/L (ref 3.5–5.1)
Sodium: 141 mmol/L (ref 135–145)
Sodium: 137 mmol/L (ref 135–145)
Total Bilirubin: 0.7 mg/dL (ref 0.3–1.2)
Total Bilirubin: 0.6 mg/dL (ref 0.3–1.2)
Total Protein: 6.9 g/dL (ref 6.5–8.1)
Total Protein: 6.6 g/dL (ref 6.5–8.1)

## 2023-02-19 LAB — CBG MONITORING, ED: Glucose-Capillary: 284 mg/dL — ABNORMAL HIGH (ref 70–99)

## 2023-02-19 LAB — PROTIME-INR
INR: 1.2 (ref 0.8–1.2)
Prothrombin Time: 14.8 seconds (ref 11.4–15.2)

## 2023-02-19 LAB — SARS CORONAVIRUS 2 (TAT 6-24 HRS): SARS Coronavirus 2: NEGATIVE

## 2023-02-19 LAB — TYPE AND SCREEN
ABO/RH(D): A POS
Antibody Screen: NEGATIVE

## 2023-02-19 LAB — SURGICAL PCR SCREEN
MRSA, PCR: NEGATIVE
Staphylococcus aureus: NEGATIVE

## 2023-02-19 LAB — TROPONIN I (HIGH SENSITIVITY)
Troponin I (High Sensitivity): 46 ng/L — ABNORMAL HIGH (ref <18)
Troponin I (High Sensitivity): 44 ng/L — ABNORMAL HIGH (ref <18)

## 2023-02-19 LAB — BRAIN NATRIURETIC PEPTIDE: B Natriuretic Peptide: 331.6 pg/mL — ABNORMAL HIGH (ref 0.0–100.0)

## 2023-02-19 MED ORDER — ISOSORBIDE MONONITRATE ER 30 MG PO TB24
30.0000 mg | ORAL_TABLET | Freq: Every day | ORAL | Status: DC
  Administered 2023-02-20 – 2023-02-22 (×2): 30 mg via ORAL
  Filled 2023-02-19 (×2): qty 1

## 2023-02-19 MED ORDER — DOCUSATE SODIUM 100 MG PO CAPS
100.0000 mg | ORAL_CAPSULE | Freq: Every day | ORAL | Status: DC
  Administered 2023-02-20 – 2023-02-22 (×2): 100 mg via ORAL
  Filled 2023-02-19 (×2): qty 1

## 2023-02-19 MED ORDER — FUROSEMIDE 10 MG/ML IJ SOLN: 80.0000 mg | Freq: Once | INTRAMUSCULAR | Status: DC

## 2023-02-19 MED ORDER — ASPIRIN 81 MG PO TBEC
81.0000 mg | DELAYED_RELEASE_TABLET | Freq: Every day | ORAL | Status: DC
  Administered 2023-02-20: 81 mg via ORAL
  Filled 2023-02-19: qty 1

## 2023-02-19 MED ORDER — PANTOPRAZOLE SODIUM 40 MG PO TBEC
40.0000 mg | DELAYED_RELEASE_TABLET | Freq: Every day | ORAL | Status: DC
  Administered 2023-02-20 – 2023-02-22 (×2): 40 mg via ORAL
  Filled 2023-02-19 (×2): qty 1

## 2023-02-19 MED ORDER — HEPARIN BOLUS VIA INFUSION
5500.0000 [IU] | Freq: Once | INTRAVENOUS | Status: AC
  Administered 2023-02-19: 5500 [IU] via INTRAVENOUS
  Filled 2023-02-19: qty 5500

## 2023-02-19 MED ORDER — HEPARIN BOLUS VIA INFUSION
4000.0000 [IU] | Freq: Once | INTRAVENOUS | Status: DC
  Filled 2023-02-19: qty 4000

## 2023-02-19 MED ORDER — CALCIUM POLYCARBOPHIL 625 MG PO TABS
625.0000 mg | ORAL_TABLET | Freq: Every day | ORAL | Status: DC
  Administered 2023-02-20 – 2023-02-22 (×2): 625 mg via ORAL
  Filled 2023-02-19 (×4): qty 1

## 2023-02-19 MED ORDER — ALBUTEROL SULFATE (2.5 MG/3ML) 0.083% IN NEBU
3.0000 mL | INHALATION_SOLUTION | RESPIRATORY_TRACT | Status: DC | PRN
  Administered 2023-02-20: 3 mL via RESPIRATORY_TRACT
  Filled 2023-02-19: qty 3

## 2023-02-19 MED ORDER — PREGABALIN 50 MG PO CAPS
100.0000 mg | ORAL_CAPSULE | Freq: Two times a day (BID) | ORAL | Status: DC
  Administered 2023-02-20 – 2023-02-22 (×5): 100 mg via ORAL
  Filled 2023-02-19: qty 1
  Filled 2023-02-19 (×2): qty 2
  Filled 2023-02-19 (×2): qty 1

## 2023-02-19 MED ORDER — FEBUXOSTAT 40 MG PO TABS
40.0000 mg | ORAL_TABLET | Freq: Every day | ORAL | Status: DC
  Administered 2023-02-20 – 2023-02-22 (×2): 40 mg via ORAL
  Filled 2023-02-19 (×4): qty 1

## 2023-02-19 MED ORDER — CLOPIDOGREL BISULFATE 75 MG PO TABS
75.0000 mg | ORAL_TABLET | Freq: Every day | ORAL | Status: DC
  Administered 2023-02-20: 75 mg via ORAL
  Filled 2023-02-19: qty 1

## 2023-02-19 MED ORDER — HEPARIN (PORCINE) 25000 UT/250ML-% IV SOLN: 1650.0000 [IU]/h | INTRAVENOUS | Status: DC

## 2023-02-19 MED ORDER — HEPARIN (PORCINE) 25000 UT/250ML-% IV SOLN
1900.0000 [IU]/h | INTRAVENOUS | Status: DC
  Administered 2023-02-19: 1650 [IU]/h via INTRAVENOUS
  Administered 2023-02-20: 1800 [IU]/h via INTRAVENOUS
  Administered 2023-02-20: 2100 [IU]/h via INTRAVENOUS
  Administered 2023-02-21: 1900 [IU]/h via INTRAVENOUS
  Filled 2023-02-19 (×4): qty 250

## 2023-02-19 MED ORDER — NITROGLYCERIN 0.4 MG SL SUBL: 0.4000 mg | SUBLINGUAL_TABLET | SUBLINGUAL | Status: DC | PRN

## 2023-02-19 MED ORDER — INSULIN ASPART 100 UNIT/ML IJ SOLN
0.0000 [IU] | Freq: Three times a day (TID) | INTRAMUSCULAR | Status: DC
  Administered 2023-02-20 (×2): 11 [IU] via SUBCUTANEOUS
  Administered 2023-02-21: 15 [IU] via SUBCUTANEOUS
  Administered 2023-02-21 – 2023-02-22 (×4): 8 [IU] via SUBCUTANEOUS

## 2023-02-19 MED ORDER — FUROSEMIDE 10 MG/ML IJ SOLN
60.0000 mg | Freq: Once | INTRAMUSCULAR | Status: AC
  Administered 2023-02-19: 60 mg via INTRAVENOUS
  Filled 2023-02-19: qty 6

## 2023-02-19 MED ORDER — EZETIMIBE 10 MG PO TABS
10.0000 mg | ORAL_TABLET | Freq: Every day | ORAL | Status: DC
  Administered 2023-02-20 – 2023-02-22 (×2): 10 mg via ORAL
  Filled 2023-02-19 (×2): qty 1

## 2023-02-19 MED ORDER — MIRTAZAPINE 15 MG PO TABS
30.0000 mg | ORAL_TABLET | Freq: Every day | ORAL | Status: DC
  Administered 2023-02-20 – 2023-02-21 (×3): 30 mg via ORAL
  Filled 2023-02-19 (×3): qty 2

## 2023-02-19 MED ORDER — INSULIN ASPART 100 UNIT/ML IJ SOLN
0.0000 [IU] | Freq: Every day | INTRAMUSCULAR | Status: DC
  Administered 2023-02-19: 3 [IU] via SUBCUTANEOUS
  Administered 2023-02-20: 2 [IU] via SUBCUTANEOUS
  Administered 2023-02-21: 5 [IU] via SUBCUTANEOUS

## 2023-02-19 NOTE — Progress Notes (Addendum)
Patient signed all consents at PAT lab appointment. CHG soap and instructions were given to patient. CHG surgical prep reviewed with patient and all questions answered.  Pts med rec was not complete at time of lab appointment. Pt stated that he can better provide medication information if his meds were in front of him. Pt was given the Pharmacy Call Center number and instructed to call them today and review all medications with them prior to surgery. Pt understood instructions.  Pt also endorses increased SOB this morning as he was walking into the hospital. Once EKG was completed, result showed A. Flutter, which is new compared to previous EKG. Julieta Gutting, RN with TAVR team made aware and she discussed this with the patient during lab appointment. Pt educated/instructed to call 911/go to the hospital if his SOB does not resolve with rest between now and surgery.  Pt denies any respiratory illness/infection in the last two months.

## 2023-02-19 NOTE — ED Provider Notes (Signed)
[place] EMERGENCY DEPARTMENT AT [facility] Provider Note   CSN: [id number] Arrival date & time: 02/19/23  1401     History  Chief Complaint  Patient presents with   Shortness of Breath    Dylan Bruce is a 77 y.o. male.  HPI 77 year old male presents with shortness of breath.  He has been dealing with shortness of breath for the last week or more and has been having trouble controlling fluids.  He has been in and out of the cardiology office as he is getting ready to have a TAVR.  He denies any chest pain but the shortness of breath got dramatically worse for about an hour or so where he was diaphoretic today.  The shortness of breath is more than just exertional now.  Right now he feels fine.  This episode happened around 11 AM.  He called the cardiology team who advised him to come to the ER and that he would probably be admitted and stay in the hospital until his procedure in 2 days.  He states he was up about 4 pounds earlier today and took 1 torsemide as instructed.  Home Medications Prior to Admission medications   Medication Sig Start Date End Date Taking? Authorizing Provider  albuterol (PROVENTIL HFA;VENTOLIN HFA) 108 (90 Base) MCG/ACT inhaler Inhale 2 puffs into the lungs every 4 (four) hours as needed for wheezing. 04/16/17   [provider]  aspirin EC 81 MG EC tablet Take 1 tablet (81 mg total) by mouth daily. 06/19/18   Kroeger, Ovidio Kin., PA-C  atorvastatin (LIPITOR) 80 MG tablet Take 1 tablet (80 mg total) by mouth daily. 01/12/23 02/11/23  Arrien, York Ram, MD  BYDUREON 2 MG SUSR Inject 2 mg into the skin once a week. 08/15/13   [provider]  clobetasol cream (TEMOVATE) 0.05 % Apply 1 application topically daily as needed (rash).  08/29/13   [provider]  clopidogrel (PLAVIX) 75 MG tablet Take 1 tablet (75 mg total) by mouth daily. 06/18/18   Kroeger, Dot Lanes M., PA-C  COSENTYX SENSOREADY, 300 MG, 150 MG/ML SOAJ Inject 300  mg into the skin every 30 (thirty) days. 04/26/18   [provider]  cyclobenzaprine (FLEXERIL) 10 MG tablet Take 10 mg by mouth at bedtime.  01/11/15   [provider]  desonide (DESOWEN) 0.05 % cream Apply 1 application topically daily as needed (rash).     [provider]  docusate sodium (COLACE) 100 MG capsule Take 100 mg by mouth daily.     [provider]  esomeprazole (NEXIUM) 40 MG capsule Take 40 mg by mouth daily before breakfast.    [provider]  ezetimibe (ZETIA) 10 MG tablet Take 10 mg by mouth daily. 07/13/17   [provider]  FINACEA 15 % cream Apply 1 application topically 2 (two) times daily as needed (rash).  11/17/14   [provider]  hydrocortisone (ANUCORT-HC) 25 MG suppository Place 1 suppository rectally 2 (two) times daily as needed for hemorrhoids. 01/12/17   [provider]  hydrOXYzine (VISTARIL) 25 MG capsule TAKE ONE CAPSULE BY MOUTH EVERY 6 HOURS AS NEEDED 01/15/23   [provider]  insulin lispro (HUMALOG KWIKPEN) 100 UNIT/ML KiwkPen Inject 18 Units into the skin 3 (three) times daily with meals.  07/16/17   [provider]  isosorbide mononitrate (IMDUR) 30 MG 24 hr tablet Take 1 tablet (30 mg total) by mouth daily. Please schedule appointment for refills. 01/12/23  Arrien, York Ram, MD  LANTUS SOLOSTAR 100 UNIT/ML Solostar Pen Inject 22 Units into the skin at bedtime. 04/29/18   [provider]  LYRICA 100 MG capsule Take 1 capsule (100 mg total) by mouth 2 (two) times daily. 01/12/23   Arrien, York Ram, MD  metoprolol tartrate (LOPRESSOR) 50 MG tablet Take as directed prior to 3/27 CT scan 01/24/23   Kathleene Hazel, MD  mirtazapine (REMERON) 30 MG tablet Take 1 tablet by mouth at bedtime.  12/16/14   [provider]  nitroGLYCERIN (NITROSTAT) 0.4 MG SL tablet Place 1 tablet (0.4 mg total) under the tongue every 5 (five) minutes as needed for chest  pain. Patient taking differently: Place 1.2 mg under the tongue at bedtime. 08/14/12   Lewayne Bunting, MD  nystatin-triamcinolone (MYCOLOG II) cream Apply 1 application topically daily as needed (rash).     [provider]  oxyCODONE-acetaminophen (PERCOCET) 10-325 MG per tablet Take 1-2 tablets by mouth 2 (two) times daily as needed for pain.     [provider]  polycarbophil (FIBERCON) 625 MG tablet Take 625 mg by mouth daily.    [provider]  predniSONE (DELTASONE) 50 MG tablet Take as directed prior to 4/17 surgery 02/13/23   Kathleene Hazel, MD  sodium fluoride (PREVIDENT) 1.1 % GEL dental gel Place 1 application onto teeth daily.  11/08/16   [provider]  SOOLANTRA 1 % CREA Apply 1 application  topically daily as needed (rash). 06/01/18   [provider]  tamsulosin (FLOMAX) 0.4 MG CAPS capsule Take 0.4 capsules by mouth daily. 04/23/18   [provider]  torsemide (DEMADEX) 20 MG tablet Take 1 tablet (20 mg total) by mouth daily. (Take 2 tablets in case of weight gain of 2 to 3 lbs in 24 hrs or 5 lbs in 7 days. 01/13/23 02/12/23  Arrien, York Ram, MD  ULORIC 40 MG tablet Take 1 tablet by mouth Daily. 06/04/12   [provider]  VIIBRYD 40 MG TABS Take 40 mg by mouth Daily.  06/12/12   [provider]      Allergies    Cefuroxime, Ibuprofen, Iodinated contrast media, Cefpodoxime, Ciprofloxacin, Doxycycline, Sulfamethoxazole-trimethoprim, Fluocinolone, Probenecid, Allopurinol, Azithromycin, and Sulfa antibiotics    Review of Systems   Review of Systems  Respiratory:  Positive for cough (chronic) and shortness of breath.   Cardiovascular:  Negative for chest pain.    Physical Exam Updated Vital Signs BP (!) 154/94 (BP Location: Left Arm)   Pulse 69   Temp 98.3 F (36.8 C) (Oral)   Resp 18   Ht 6\' 2"  (1.88 m)   Wt 103.9 kg   SpO2 94%   BMI 29.41 kg/m  Physical Exam Vitals and nursing note  reviewed.  Constitutional:      Appearance: He is well-developed.  HENT:     Head: Normocephalic and atraumatic.  Neck:     Vascular: JVD (mild) present.  Cardiovascular:     Rate and Rhythm: Normal rate and regular rhythm.     Heart sounds: Murmur heard.  Pulmonary:     Effort: Pulmonary effort is normal. No tachypnea, accessory muscle usage or respiratory distress.     Breath sounds: Rales (slight, basilar) present.  Abdominal:     Palpations: Abdomen is soft.     Tenderness: There is no abdominal tenderness.  Musculoskeletal:     Comments: BLE in compression stockings  Skin:    General: Skin is warm and dry.  Neurological:     Mental Status: He is alert.     ED Results / Procedures / Treatments   Labs (all labs ordered are listed, but only abnormal results are displayed) Labs Reviewed  CBC - Abnormal; Notable for the following components:      Result Value   WBC 12.9 (*)    Hemoglobin 11.2 (*)    HCT 36.5 (*)    RDW 20.0 (*)    Platelets 469 (*)    All other components within normal limits  COMPREHENSIVE METABOLIC PANEL - Abnormal; Notable for the following components:   Glucose, Bld 280 (*)    BUN 44 (*)    Creatinine, Ser 1.75 (*)    Albumin 3.2 (*)    ALT 48 (*)    GFR, Estimated 40 (*)    All other components within normal limits  BRAIN NATRIURETIC PEPTIDE - Abnormal; Notable for the following components:   B Natriuretic Peptide 331.6 (*)    All other components within normal limits  TROPONIN I (HIGH SENSITIVITY) - Abnormal; Notable for the following components:   Troponin I (High Sensitivity) 46 (*)    All other components within normal limits  TROPONIN I (HIGH SENSITIVITY) - Abnormal; Notable for the following components:   Troponin I (High Sensitivity) 44 (*)    All other components within normal limits  HEPARIN LEVEL (UNFRACTIONATED)  CBC    EKG EKG Interpretation  Date/Time:  Monday February 19 2023 14:02:19 EDT Ventricular Rate:  69 PR  Interval:    QRS Duration: 106 QT Interval:  428 QTC Calculation: 458 R Axis:   32 Text Interpretation: Atrial flutter with 4:1 A-V conduction Septal infarct , age undetermined similar to earlier in the day Confirmed by Pricilla Loveless [phone]) on 02/19/2023 7:04:17 PM  Radiology DG Chest 2 View  Result Date: 02/19/2023 CLINICAL DATA:  Preop exam. EXAM: CHEST - 2 VIEW COMPARISON:  January 01, 2021. FINDINGS: Mild peripheral interstitial prominence. No confluent consolidation. No visible pleural effusions or pneumothorax. Cardiomediastinal silhouette is unchanged. CABG and median sternotomy. Partially imaged left reverse shoulder arthroplasty. IMPRESSION: Mild peripheral interstitial prominence, suggestive of mild interstitial edema. No confluent consolidation. Electronically Signed   By: Feliberto Harts M.D.   On: 02/19/2023 14:33    Procedures Procedures    Medications Ordered in ED Medications  aspirin EC tablet 81 mg (has no administration in time range)  febuxostat (ULORIC) tablet 40 mg (has no administration in time range)  ezetimibe (ZETIA) tablet 10 mg (has no administration in time range)  isosorbide mononitrate (IMDUR) 24 hr tablet 30 mg (has no administration in time range)  nitroGLYCERIN (NITROSTAT) SL tablet 0.4 mg (has no administration in time range)  mirtazapine (REMERON) tablet 30 mg (has no administration in time range)  docusate sodium (COLACE) capsule 100 mg (has no administration in time range)  pantoprazole (PROTONIX) EC tablet 40 mg (has no administration in time range)  polycarbophil (FIBERCON) tablet 625 mg (has no administration in time range)  clopidogrel (PLAVIX) tablet 75 mg (has no administration in time range)  pregabalin (LYRICA) capsule 100 mg (has no administration in time range)  albuterol (PROVENTIL) (2.5 MG/3ML) 0.083% nebulizer solution 3 mL (has no administration in time range)  heparin ADULT infusion 100 units/mL (25000 units/249mL) (1,650  Units/hr Intravenous New Bag/Given 02/19/23 2052)  insulin aspart (novoLOG) injection 0-15 Units (has no administration in time range)  insulin aspart (novoLOG) injection 0-5 Units (has no administration in time range)  furosemide (LASIX) injection  60 mg (60 mg Intravenous Given 02/19/23 2049)  heparin bolus via infusion 5,500 Units (5,500 Units Intravenous Bolus from Bag 02/19/23 2052)    ED Course/ Medical Decision Making/ A&P                             Medical Decision Making Amount and/or Complexity of Data Reviewed Labs: ordered.    Details: Elevated BNP.  Troponin is mildly elevated but likely from CHF.  Creatinine near baseline. Radiology: ordered and independent interpretation performed.    Details: Mild CHF. ECG/medicine tests: ordered and independent interpretation performed.    Details: Atrial flutter, unchanged from earlier.  Risk Prescription drug management. Decision regarding hospitalization.   Patient presents with poorly controlled but relatively mild CHF.  Symptoms are up-and-down and seem to be worse today.  I doubt acute PE, ACS, etc.  Overall it sounds like cardiology wants to admit him for better symptom control prior to his TAVR.  Discussed with Dr. Diona Browner who will admit.  He states to hold on Lasix and he will see and treat.        Final Clinical Impression(s) / ED Diagnoses Final diagnoses:  Acute congestive heart failure, unspecified heart failure type    Rx / DC Orders ED Discharge Orders     None         Pricilla Loveless, MD 02/19/23 [phone]

## 2023-02-19 NOTE — ED Provider Triage Note (Signed)
Emergency Medicine Provider Triage Evaluation Note  Dylan Bruce , a 77 y.o. male  was evaluated in triage.  Pt complains of worsening shortness of breath since this morning.  Notes he is scheduled for an aortic valve replacement in 2 days and had his preop visit this morning and was told he had some fluid on his heart and that if his symptoms got worse he needed to come to the ER to be admitted to the hospital until his procedure.  He denies associated chest pain, palpitations, lightheadedness, dizziness, fever, chills, nausea, vomiting, diarrhea, or abdominal pain.  He states he is on torsemide at home to help with fluid which she has been taking.  Notes his last hospitalization for heart failure was last month when he was admitted for 5 days.  Review of Systems  Positive: See HPI Negative: See HPI  Physical Exam  BP 127/83 (BP Location: Right Arm)   Pulse 70   Temp 98 F (36.7 C)   Resp 18   SpO2 94%  Gen:   Awake, no distress   Resp:  Normal effort lungs clear to auscultation MSK:   Moves extremities without difficulty 1+ pitting edema to bilateral lower extremities Other:  Regular rate and rhythm, abdomen soft and nontender, neurologically intact  Medical Decision Making  Medically screening exam initiated at 2:43 PM.  Appropriate orders placed.  [name] was informed that the remainder of the evaluation will be completed by another provider, this initial triage assessment does not replace that evaluation, and the importance of remaining in the ED until their evaluation is complete.     Tonette Lederer, PA-C 02/19/23 1444

## 2023-02-19 NOTE — H&P (Addendum)
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  The patient was in his normal state of health up until early March of this year when he presented with acute on chronic diastolic heart failure.  His proBNP was elevated to greater than 13,000.  His troponins were moderately elevated.  He was transferred from [facility] to [facility] where he was medically stabilized.  He underwent coronary angiography which demonstrated high-grade native vessel disease in 2 out of 4 bypass conduits were patent; the LIMA to LAD and vein graft to obtuse marginal.  During that admission he was also noted to be in a first-degree AV block with intermittent second-degree Mobitz type II.  He was ultimately discharged in early March on a regimen including Plavix 75 mg, aspirin 81 mg, atorvastatin 80 mg, lisinopril 5 mg and torsemide 20 mg.  He was seen by Dr. Clifton James in mid March and was doing well.  He was referred for TAVR protocol CTA which demonstrated anatomy amenable to a 29 mm SAPIEN 3 valve with a generous membranous septum length of 7 mm.  He was seen by Dr. Delia Chimes in early April he was noted to have peripheral edema at that appointment and was up 9 pounds.  He had his torsemide increased and repeat lab work demonstrated an elevated creatinine.  A proBNP drawn on April 10 was highly elevated at greater  than 9000.  His creatinine was mildly improved at 1.9 down from 2.  He was contacted by our structural heart division.  His creatinine had improved to 1.5 however his EKG demonstrated new atrial flutter.  The patient reported increasing shortness of breath and a 4 pound weight gain.  Based on this constellation of signs and symptoms he was instructed to proceed to the emergency department for likely admission for acute on chronic diastolic heart failure.  Here in ER he is in A flutter that is new for him.  He is for TAVR on 02/21/23.   Labs Na 137 K+ 3.9 glucose 280 BUN 44 Cr 1.75 albumin 3.2 ALT 48  BNP 331 Hs troponin 46 and 44.  WBC 12.9 Hgb 11.2 Hct 36.5 plts 469  2V CXR   IMPRESSION: Mild peripheral interstitial prominence, suggestive of mild interstitial edema. No confluent consolidation.   BP 137/97  P 70 R 13  Past Medical History:  Diagnosis Date   Aortic stenosis    ARTHRITIS    ASTHMA    CAD (coronary artery disease)    CHF (congestive heart failure) (HCC)    DM    GERD    HYPERLIPIDEMIA    Hypertension    PSORIASIS    SLEEP APNEA    SPINA BIFIDA     Past Surgical History:  Procedure Laterality Date   BACK SURGERY     CARPAL TUNNEL RELEASE Left 08/21/2019   Procedure: CARPAL TUNNEL RELEASE;  Surgeon: Merlyn Lot,  Jillyn Hidden, MD;  Location: [facility];  Service: Orthopedics;  Laterality: Left;  AXILLARY BLOCK   CORONARY ARTERY BYPASS GRAFT     JOINT REPLACEMENT Left    shoulder   LEFT HEART CATH AND CORS/GRAFTS ANGIOGRAPHY N/A 06/18/2018   Procedure: LEFT HEART CATH AND CORS/GRAFTS ANGIOGRAPHY;  Surgeon: Runell Gess, MD;  Location: [facility];  Service: Cardiovascular;  Laterality: N/A;   RIGHT/LEFT HEART CATH AND CORONARY/GRAFT ANGIOGRAPHY N/A 01/11/2023   Procedure: RIGHT/LEFT HEART CATH AND CORONARY/GRAFT ANGIOGRAPHY;  Surgeon: Kathleene Hazel, MD;  Location: [facility];  Service: Cardiovascular;  Laterality: N/A;   TRIGGER FINGER  RELEASE Left 08/21/2019   Procedure: RELEASE TRIGGER LEFT SMALL FINGER LEFT INDEX;  Surgeon: Cindee Salt, MD;  Location: [facility];  Service: Orthopedics;  Laterality: Left;   ULNAR NERVE TRANSPOSITION Left 08/21/2019   Procedure: DECOMPRESSION WITH ULNAR NERVE LEFT CUBITAL TUNNEL ULNAR;  Surgeon: Cindee Salt, MD;  Location: [facility];  Service: Orthopedics;  Laterality: Left;     Medications Prior to Admission: Prior to Admission medications   Medication Sig Start Date End Date Taking? Authorizing Provider  albuterol (PROVENTIL HFA;VENTOLIN HFA) 108 (90 Base) MCG/ACT inhaler Inhale 2 puffs into the lungs every 4 (four) hours as needed for wheezing. 04/16/17   [provider]  aspirin EC 81 MG EC tablet Take 1 tablet (81 mg total) by mouth daily. 06/19/18   Kroeger, Ovidio Kin., PA-C  atorvastatin (LIPITOR) 80 MG tablet Take 1 tablet (80 mg total) by mouth daily. 01/12/23 02/11/23  Arrien, York Ram, MD  BYDUREON 2 MG SUSR Inject 2 mg into the skin once a week. 08/15/13   [provider]  clobetasol cream (TEMOVATE) 0.05 % Apply 1 application topically daily as needed (rash).  08/29/13   [provider]  clopidogrel (PLAVIX) 75 MG tablet Take 1 tablet (75 mg total) by mouth daily. 06/18/18   Kroeger, Dot Lanes M., PA-C  COSENTYX SENSOREADY, 300 MG, 150 MG/ML SOAJ Inject 300 mg into the skin every 30 (thirty) days. 04/26/18   [provider]  cyclobenzaprine (FLEXERIL) 10 MG tablet Take 10 mg by mouth at bedtime.  01/11/15   [provider]  desonide (DESOWEN) 0.05 % cream Apply 1 application topically daily as needed (rash).     [provider]  docusate sodium (COLACE) 100 MG capsule Take 100 mg by mouth daily.     [provider]  esomeprazole (NEXIUM) 40 MG capsule Take 40 mg by mouth daily before breakfast.    [provider]  ezetimibe (ZETIA) 10 MG tablet Take 10 mg by mouth daily. 07/13/17    [provider]  FINACEA 15 % cream Apply 1 application topically 2 (two) times daily as needed (rash).  11/17/14   [provider]  hydrocortisone (ANUCORT-HC) 25 MG suppository Place 1 suppository rectally 2 (two) times daily as needed for hemorrhoids. 01/12/17   [provider]  hydrOXYzine (VISTARIL) 25 MG capsule TAKE ONE CAPSULE BY MOUTH EVERY 6 HOURS AS NEEDED 01/15/23   [provider]  insulin lispro (HUMALOG KWIKPEN) 100 UNIT/ML KiwkPen Inject 18 Units into the skin 3 (three) times daily with meals.  07/16/17   [provider]  isosorbide mononitrate (IMDUR) 30 MG 24 hr tablet Take 1 tablet (30 mg total) by mouth daily. Please schedule appointment for refills. 01/12/23   Arrien, York Ram, MD  LANTUS SOLOSTAR 100 UNIT/ML Solostar Pen Inject 22 Units into  the skin at bedtime. 04/29/18   [provider]  LYRICA 100 MG capsule Take 1 capsule (100 mg total) by mouth 2 (two) times daily. 01/12/23   Arrien, York Ram, MD  metoprolol tartrate (LOPRESSOR) 50 MG tablet Take as directed prior to 3/27 CT scan 01/24/23   Kathleene Hazel, MD  mirtazapine (REMERON) 30 MG tablet Take 1 tablet by mouth at bedtime.  12/16/14   [provider]  nitroGLYCERIN (NITROSTAT) 0.4 MG SL tablet Place 1 tablet (0.4 mg total) under the tongue every 5 (five) minutes as needed for chest pain. Patient taking differently: Place 1.2 mg under the tongue at bedtime. 08/14/12   Lewayne Bunting, MD  nystatin-triamcinolone (MYCOLOG II) cream Apply 1 application topically daily as needed (rash).     [provider]  oxyCODONE-acetaminophen (PERCOCET) 10-325 MG per tablet Take 1-2 tablets by mouth 2 (two) times daily as needed for pain.     [provider]  polycarbophil (FIBERCON) 625 MG tablet Take 625 mg by mouth daily.    [provider]  predniSONE (DELTASONE) 50 MG tablet Take as directed prior to 4/17 surgery 02/13/23    Kathleene Hazel, MD  sodium fluoride (PREVIDENT) 1.1 % GEL dental gel Place 1 application onto teeth daily.  11/08/16   [provider]  SOOLANTRA 1 % CREA Apply 1 application  topically daily as needed (rash). 06/01/18   [provider]  tamsulosin (FLOMAX) 0.4 MG CAPS capsule Take 0.4 capsules by mouth daily. 04/23/18   [provider]  torsemide (DEMADEX) 20 MG tablet Take 1 tablet (20 mg total) by mouth daily. (Take 2 tablets in case of weight gain of 2 to 3 lbs in 24 hrs or 5 lbs in 7 days. 01/13/23 02/12/23  Arrien, York Ram, MD  ULORIC 40 MG tablet Take 1 tablet by mouth Daily. 06/04/12   [provider]  VIIBRYD 40 MG TABS Take 40 mg by mouth Daily.  06/12/12   [provider]     Allergies:    Allergies  Allergen Reactions   Cefuroxime Diarrhea and Nausea And Vomiting    Severe   Ibuprofen Anaphylaxis and Shortness Of Breath        Iodinated Contrast Media Shortness Of Breath and Other (See Comments)    Pt developed burning of face and SOB after 02/06/2023 CT scan    Cefpodoxime Rash   Ciprofloxacin Rash   Doxycycline Rash   Sulfamethoxazole-Trimethoprim Rash   Fluocinolone Other (See Comments)    unknown   Probenecid Other (See Comments)    unknown   Allopurinol Rash   Azithromycin Rash   Sulfa Antibiotics Rash    Social History:   Social History   Socioeconomic History   Marital status: Married    Spouse name: Not on file   Number of children: 2   Years of education: Not on file   Highest education level: Not on file  Occupational History   Occupation: Retired-Owned a Administrator, sports  Tobacco Use   Smoking status: Never   Smokeless tobacco: Never  Substance and Sexual Activity   Alcohol use: Not Currently   Drug use: No   Sexual activity: Not on file  Other Topics Concern   Not on file  Social History Narrative   Not on file   Social Determinants of Health   Financial Resource Strain: Not on file   Food Insecurity: No Food Insecurity (01/07/2023)   Hunger Vital Sign    Worried  About Running Out of Food in the Last Year: Never true    Ran Out of Food in the Last Year: Never true  Transportation Needs: No Transportation Needs (01/07/2023)   PRAPARE - Administrator, Civil Service (Medical): No    Lack of Transportation (Non-Medical): No  Physical Activity: Not on file  Stress: Not on file  Social Connections: Not on file  Intimate Partner Violence: Not At Risk (01/07/2023)   Humiliation, Afraid, Rape, and Kick questionnaire    Fear of Current or Ex-Partner: No    Emotionally Abused: No    Physically Abused: No    Sexually Abused: No    Family History:   The patient's family history includes Prostate cancer in his father; Valvular heart disease in his father.    ROS:  Please see the history of present illness.  All other ROS reviewed and negative.     Physical Exam/Data:   Vitals:   02/19/23 1420  BP: 127/83  Pulse: 70  Resp: 18  Temp: 98 F (36.7 C)  SpO2: 94%   No intake or output data in the 24 hours ending 02/19/23 1951    02/08/2023    3:05 PM 01/22/2023    2:02 PM 01/11/2023    4:57 AM  Last 3 Weights  Weight (lbs) 229 lb 229 lb 224 lb 3.3 oz  Weight (kg) 103.874 kg 103.874 kg 101.7 kg     There is no height or weight on file to calculate BMI.  EXAM per Dr. Diona Browner.   EKG:  The ECG that was done February 19, 2023 was personally reviewed and demonstrates a flutter with 41 conduction which is new.  Relevant CV Studies:  Coronary/bypass angio + RHC March 2024 Chronic total occlusion of the mid LAD. The mid and distal LAD has diffuse moderate disease and fills from the patent LIMA graft.  The small to moderate caliber diagonal branch has diffuse severe disease, unchanged from last cath. The SVG to the diagonal is known to be occluded.  The Circumflex has mild proximal stenosis. The first OM branch is chronically occluded and fills from the patent SVG graft.   The large dominant RCA has chronic total occlusion in the mid vessel. The SVG to the distal RCA is known to be occluded. The distal RCA branches fill from left to right collaterals.    RA 1 RV 30/0/3 PA 33/12 mean 24 PCWP 4 AO 105/54  TTE March 2024 1. Difficult acoustic windows limit study Mid/distal lateral wall appears  to be hypokinetic.Marland Kitchen Left ventricular ejection fraction, by estimation, is  55%. The left ventricle has normal function. There is mild concentric left  ventricular hypertrophy. Left  ventricular diastolic parameters are indeterminate.   2. Right ventricular systolic function is normal. The right ventricular  size is normal.   3. Left atrial size was mildly dilated.   4. Mild mitral valve regurgitation.   5. AV is thickened, calcified with restricted motion. Poor acoustic  windows limit assessment Peak and mean gradients across the valve are 59  and 32 mm Hg respectively . AVA (VTI) is 0.8 cm2. Dimensionless index is  0.18 consistent with severe AS.Marland Kitchen Aortic   valve regurgitation is not visualized.   6. Aortic dilatation noted. There is mild dilatation of the aortic root,  measuring 41 mm. There is mild dilatation of the ascending aorta,  measuring 41 mm.   7. The inferior vena cava is dilated in size with <50% respiratory  variability,  suggesting right atrial pressure of 15 mmHg.   TAVR CTA April 2024 1. Tricuspid aortic valve with severely reduced cusp excursion. Severely thickened and severely calcified aortic valve cusps. 2. Aortic valve calcium score: 6409 3. Annulus area: 578 mm2, suitable for 29 mm Sapien 3 valve. No LVOT calcifications. Membranous septal length 7 mm. 4. Sufficient coronary artery heights from annulus. 5. Optimum fluoroscopic angle for delivery:  RAO 2 CRA 1  Laboratory Data:  High Sensitivity Troponin:   Recent Labs  Lab 02/19/23 1434 02/19/23 1644  TROPONINIHS 46* 44*      Chemistry Recent Labs  Lab 02/19/23 0823  02/19/23 1434  NA 141 137  K 3.8 3.9  CL 106 102  CO2 25 22  GLUCOSE 164* 280*  BUN 40* 44*  CREATININE 1.54* 1.75*  CALCIUM 9.1 8.9  GFRNONAA 47* 40*  ANIONGAP 10 13    Recent Labs  Lab 02/19/23 0823 02/19/23 1434  PROT 6.9 6.6  ALBUMIN 3.3* 3.2*  AST 35 39  ALT 49* 48*  ALKPHOS 112 111  BILITOT 0.7 0.6   Hematology Recent Labs  Lab 02/19/23 0823 02/19/23 1434  WBC 9.3 12.9*  RBC 4.51 4.30  HGB 11.4* 11.2*  HCT 38.6* 36.5*  MCV 85.6 84.9  MCH 25.3* 26.0  MCHC 29.5* 30.7  RDW 19.9* 20.0*  PLT 505* 469*   BNP Recent Labs  Lab 02/19/23 1443  BNP 331.6*     Radiology/Studies:  DG Chest 2 View  Result Date: 02/19/2023 CLINICAL DATA:  Preop exam. EXAM: CHEST - 2 VIEW COMPARISON:  January 01, 2021. FINDINGS: Mild peripheral interstitial prominence. No confluent consolidation. No visible pleural effusions or pneumothorax. Cardiomediastinal silhouette is unchanged. CABG and median sternotomy. Partially imaged left reverse shoulder arthroplasty. IMPRESSION: Mild peripheral interstitial prominence, suggestive of mild interstitial edema. No confluent consolidation. Electronically Signed   By: Feliberto Harts M.D.   On: 02/19/2023 14:33     Assessment and Plan:   Acute on chronic diastolic heart failure:BNP 331 will change torsemide to lasix 80 mg tonight and re eval tomorrow follow BMET   Atrial flutter:new will begin IV heparin  rate controlled he is on lopressor 50  CHA2DS2VASC of 5d Severe aortic stenosis: for TAVR 02/21/23  First-degree AV block with intermittent Mobitz type II: Coronary artery disease status post CABG: troponins in 40s and flat - demand ischemia with a flutter and severe AS  continue ASA for now.   DM-2 add SSI decrease insulin TID    Risk Assessment/Risk Scores:    New York Heart Association (NYHA) Functional Class NYHA Class III  CHA2DS2-VASc Score = 6   This indicates a 9.7% annual risk of stroke. The patient's score is based  upon: CHF History: 1 HTN History: 1 Diabetes History: 1 Stroke History: 0 Vascular Disease History: 1 Age Score: 2 Gender Score: 0    Severity of Illness: The appropriate patient status for this patient is INPATIENT. Inpatient status is judged to be reasonable and necessary in order to provide the required intensity of service to ensure the patient's safety. The patient's presenting symptoms, physical exam findings, and initial radiographic and laboratory data in the context of their chronic comorbidities is felt to place them at high risk for further clinical deterioration. Furthermore, it is not anticipated that the patient will be medically stable for discharge from the hospital within 2 midnights of admission.   * I certify that at the point of admission it is my clinical judgment that the patient  will require inpatient hospital care spanning beyond 2 midnights from the point of admission due to high intensity of service, high risk for further deterioration and high frequency of surveillance required.*   For questions or updates, please contact  [facility] Please consult [email or web address] for contact info under     Signed, Nada Boozer, NP  02/19/2023 7:51 PM    Attending note:  Patient seen and examined.  I reviewed extensive records including recent TAVR workup and last hospital stay, chart notes from the structural heart team, and discussed the case with Ms. Ingold NP.  Mr. Schoon is being admitted prior to anticipated TAVR due to increasing shortness of breath and weight gain in the short-term.  My understanding is he has been taking Demadex 20 mg alternating with 40 mg every other day, had been at his baseline weight as of this weekend, but gained 4 pounds in the last 24 hours without specific provocation.  He reports compliance with his medications, no unusual fluid or sodium intake.  Did have an episode of clamminess and diaphoresis today without obvious cause.  He did not  check his vital signs at the time.  No dizziness or syncope.  History of conduction system disease noted, and now rate controlled atypical atrial flutter with 4 1 block by ECG which is newly discovered.  He does not report any chest pain.  He wears compression stockings, no obvious worsening leg swelling.  On examination this evening in the ER he appears comfortable.  Afebrile, systolic in the 150s, heart rate in the 70s with rate controlled atrial flutter.  He has decreased breath sounds at the bases with a few crackles, no wheezing.  Cardiac exam with largely RRR and 3/6 systolic murmur, no gallop.  Abdomen with normal bowel sounds.  Compression hose bilaterally on lower legs.  Pertinent lab work includes potassium 3.9, BUN 44, creatinine 1.75 up from 1.54, BNP 331, high-sensitivity troponin I level flat in the 40s not suggestive of ACS, hemoglobin 11.2, platelets 469.  Chest x-ray reports increased peripheral interstitial markings and mild interstitial edema.  Presentation with shortness of breath and weight gain, possibly mild fluid overload and HFpEF complicated by aortic stenosis and recent arrhythmia.  Creatinine is up to 1.75, he has not intensified diuretics at home recently.  Chest x-ray does show some increased interstitial markings/edema, BNP mildly to moderately elevated.  Plan is admission for optimization of fluid status prior to TAVR.  We will give him a single dose of IV Lasix 60 mg and then reassess urine output with BMET in the morning.  He is not on any AV nodal blockers at present, follow telemetry carefully for evidence of tachycardia-bradycardia syndrome.  He is at higher risk for need of pacemaker after TAVR I suspect.  Otherwise continue Plavix, Lipitor, Zetia, and Imdur.  No longer on lisinopril.  Jonelle Sidle, M.D., F.A.C.C.

## 2023-02-19 NOTE — Progress Notes (Signed)
ANTICOAGULATION CONSULT NOTE - Initial Consult  Pharmacy Consult for Heparin Indication: atrial fibrillation  Allergies  Allergen Reactions   Cefuroxime Diarrhea and Nausea And Vomiting    Severe   Ibuprofen Anaphylaxis and Shortness Of Breath        Iodinated Contrast Media Shortness Of Breath and Other (See Comments)    Pt developed burning of face and SOB after 02/06/2023 CT scan    Cefpodoxime Rash   Ciprofloxacin Rash   Doxycycline Rash   Sulfamethoxazole-Trimethoprim Rash   Fluocinolone Other (See Comments)    unknown   Probenecid Other (See Comments)    unknown   Allopurinol Rash   Azithromycin Rash   Sulfa Antibiotics Rash    Patient Measurements: Height: 6\' 2"  (188 cm) Weight: 103.9 kg (229 lb 0.9 oz) IBW/kg (Calculated) : 82.2 Heparin Dosing Weight: 103.1 kg  Vital Signs: Temp: 98 F (36.7 C) (04/15 1420) BP: 127/83 (04/15 1420) Pulse Rate: 70 (04/15 1420)  Labs: Recent Labs    02/19/23 0823 02/19/23 1434 02/19/23 1644  HGB 11.4* 11.2*  --   HCT 38.6* 36.5*  --   PLT 505* 469*  --   LABPROT 14.8  --   --   INR 1.2  --   --   CREATININE 1.54* 1.75*  --   TROPONINIHS  --  46* 44*    Estimated Creatinine Clearance: 46.9 mL/min (A) (by C-G formula based on SCr of 1.75 mg/dL (H)).   Medical History: Past Medical History:  Diagnosis Date   Aortic stenosis    ARTHRITIS    ASTHMA    CAD (coronary artery disease)    CHF (congestive heart failure) (HCC)    DM    GERD    HYPERLIPIDEMIA    Hypertension    PSORIASIS    SLEEP APNEA    SPINA BIFIDA     Medications:  (Not in a hospital admission)  Scheduled:   aspirin EC  81 mg Oral Daily   clopidogrel  75 mg Oral Daily   docusate sodium  100 mg Oral Daily   ezetimibe  10 mg Oral Daily   febuxostat  40 mg Oral Daily   furosemide  60 mg Intravenous Once   heparin  4,000 Units Intravenous Once   isosorbide mononitrate  30 mg Oral Daily   mirtazapine  30 mg Oral QHS   pantoprazole  40 mg  Oral Daily   polycarbophil  625 mg Oral Daily   pregabalin  100 mg Oral BID   Infusions:   heparin     PRN: albuterol, nitroGLYCERIN  Assessment: 77 yom with a history of 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 Cardiology was planning for TAVR on 02/21/23. Patient is presenting with SOB / AF. Heparin per pharmacy consult placed for atrial fibrillation.  Patient is not on anticoagulation prior to arrival.  Hgb 11.2; plt 469 PT/INR 14.8/1.2  Goal of Therapy:  Heparin level 0.3-0.7 units/ml Monitor platelets by anticoagulation protocol: Yes   Plan:  Give IV heparin 5500 units bolus x 1 Start heparin infusion at 1650 units/hr Check anti-Xa level in 8 hours and daily while on heparin Continue to monitor H&H and platelets  Delmar Landau, PharmD, BCPS 02/19/2023 8:05 PM ED Clinical Pharmacist -  [phone]

## 2023-02-19 NOTE — Progress Notes (Unsigned)
Cardiology Office Note:    Date:  02/26/2023   ID:  Dylan Bruce, DOB 1946-08-01, MRN [id number]  PCP:  Lindwood Qua, MD  Cardiologist:  Maisie Fus, MD  Electrophysiologist:  None   Referring MD: Lindwood Qua, MD   Chief Complaint: hospital follow-up of TAVR  History of Present Illness:    Dylan Bruce is a 77 y.o. male with a history of CAD s/p CABG x4 (LIMA-LAD, SVG-OM, SVG-Diag, SVG-RCA) in 2003 with subsequent DES to SVG to PDA in 2010 (SVG-RCA and SVG-Diag now totally occluded), chronic diastolic CHF, atrial flutter on Eliquis, severe aortic stenosis s/p recent TAVR on 02/21/2023, PAD of bilateral lower extremity s/p multiple interventions, hypertension, hyperlipidemia, type 2 diabetes mellitus, CKD stage III, GERD, psoriatic arthritis, and gout who is followed by Dr. Carolan Clines and presents today for hospital follow-up after TAVR.  Patient was remotely followed by Dr. Jens Som and then [facility] and now follows with Dr. Carolan Clines. He has a long history of CAD s/p remote CABG x4 with LIMA to LAD, SVG to OM, SVG to 1st Diag, and SVG to distal RCA  in 2003 and subsequent PCI with DES of SVG to PDA in 2010. He also has a history of PAD with self reported severe  lower extremity disease bilaterally. He follows with Vascular Surgery in [place], [place] and reportedly has had multiple interventions (last one was about 1 year ago). Patient was recently admitted to [facility] in 01/2023 for NSTEMI and acute on chronic diastolic CHF. High-sensitivity troponin peaked at 11,393. Echo showed LVEF of 55% with hypokinesis of mid/distal lateral walls and mild LVH, normal RV, mild left atrial enlargement, mild MR, and severe aortic stenosis with mean gradient of 32 mmHg and AVA (VTI) of 0.8 cm^2. R/LHC showed 2/4 patent grafts (LIMA to LAD and SVG to OM) with known occlusion of SVG to distal RCA and SVG to 1st Diag. There were no focal targets for PCI and continued medical therapy was recommended for  CAD.He was aggressively diuresed with high dose IV Lasix and Metolazone and then transitioned to Torsemide. E was noted to have intermittent 2nd degree Mobitz type I and II during admission and beta-blocker was held. He was referred to Dr. Clifton James for his severe aortic stenosis and TAVR was recommended. This was scheduled for 02/21/2023. However, prior to he started having signs of decompensated CHF again with shortness of breath and weight gain as well as elevated Pro-BNP so he was admitted on 02/19/2023 for IV diuresis prior to procedure. He was also noted to be in new onset atrial flutter and was started on IV Heparin. He underwent successful TAVR with a 29 mm Edwards Sapient 3 THV on 02/21/2023. He was started on Eliquis given new atrial flutter after procedure and continued on Plavix given underlying vascular disease but Aspirin was stopped. Home Torsemide was increased at discharge.  Patient was seen in the [facility] ED on 02/23/2023 for further evaluation of hematuria. Urinalysis showed large blood, >30 RBC, 4 WBC, few bacteria, 100 mg/dL of protein, and 409 mg/dL of glucose. I don't see that a CBC was drawn. Troponin I was checked (unclear why) and was mildly elevated at 0.774. He was not complaining of any chest pain so this was felt to be secondary to recent TAVR and I agree. Pro-BNP was elevated at 5,990 (improved from 9,250 on 02/14/2023). Hematuria was felt to be due to irritation from Foley during recent admission. Head CT was also performed due  to reports of a brief episode of confusion and was unremarkable. Patient reported this was not new for him and he has had confusion for years. He was advised to follow-up with his PCP.  Patient presents today for follow-up. Here with wife and son. Overall, he is doing well. He denies any chest pain. He reports a "little" shortness of breath with activity but much better than before. No orthopnea or edema. His lower extremity edema has resolved after IV diuresis  and he states his weight is back down to his baseline weight at home. No palpitations, lightheadedness/ dizziness, or syncope. He denies any recurrent hematuria and no hematochezia or melena.  He described some intermittent bilateral leg soreness which he states is not new and is due to his low back issues. Does not sound like claudication.  Past Medical History:  Diagnosis Date   Aortic stenosis    ARTHRITIS    ASTHMA    CAD (coronary artery disease)    CHF (congestive heart failure)    DM    GERD    HYPERLIPIDEMIA    Hypertension    PSORIASIS    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 Clifton James and Dr. Delia Chimes   SLEEP APNEA    SPINA BIFIDA     Past Surgical History:  Procedure Laterality Date   BACK SURGERY     CARPAL TUNNEL RELEASE Left 08/21/2019   Procedure: CARPAL TUNNEL RELEASE;  Surgeon: Cindee Salt, MD;  Location: [facility];  Service: Orthopedics;  Laterality: Left;  AXILLARY BLOCK   CORONARY ARTERY BYPASS GRAFT     INTRAOPERATIVE TRANSTHORACIC ECHOCARDIOGRAM N/A 02/21/2023   Procedure: INTRAOPERATIVE TRANSTHORACIC ECHOCARDIOGRAM;  Surgeon: Kathleene Hazel, MD;  Location: [facility];  Service: Open Heart Surgery;  Laterality: N/A;   JOINT REPLACEMENT Left    shoulder   LEFT HEART CATH AND CORS/GRAFTS ANGIOGRAPHY N/A 06/18/2018   Procedure: LEFT HEART CATH AND CORS/GRAFTS ANGIOGRAPHY;  Surgeon: Runell Gess, MD;  Location: [facility];  Service: Cardiovascular;  Laterality: N/A;   RIGHT/LEFT HEART CATH AND CORONARY/GRAFT ANGIOGRAPHY N/A 01/11/2023   Procedure: RIGHT/LEFT HEART CATH AND CORONARY/GRAFT ANGIOGRAPHY;  Surgeon: Kathleene Hazel, MD;  Location: [facility];  Service: Cardiovascular;  Laterality: N/A;   TRANSCATHETER AORTIC VALVE REPLACEMENT, TRANSFEMORAL Left 02/21/2023   Procedure: Transcatheter Aortic Valve Replacement, Transfemoral;  Surgeon: Kathleene Hazel, MD;  Location: [facility];  Service: Open Heart Surgery;  Laterality: Left;   TRIGGER FINGER RELEASE Left 08/21/2019   Procedure: RELEASE TRIGGER LEFT SMALL FINGER LEFT INDEX;  Surgeon: Cindee Salt, MD;  Location: [facility];  Service: Orthopedics;  Laterality: Left;   ULNAR NERVE TRANSPOSITION Left 08/21/2019   Procedure: DECOMPRESSION WITH ULNAR NERVE LEFT CUBITAL TUNNEL ULNAR;  Surgeon: Cindee Salt, MD;  Location: [facility];  Service: Orthopedics;  Laterality: Left;    Current Medications: Current Meds  Medication Sig   albuterol (PROVENTIL HFA;VENTOLIN HFA) 108 (90 Base) MCG/ACT inhaler Inhale 2 puffs into the lungs every 4 (four) hours as needed for wheezing.   amoxicillin (AMOXIL) 500 MG tablet Prophylaxis for dental procedure. Take 2 grams (4x 500mg  tablets) 1 hour before procedure.   apixaban (ELIQUIS) 5 MG TABS tablet Take 1 tablet (5 mg total) by mouth 2 (two) times daily.   atorvastatin (LIPITOR) 80 MG tablet Take 1 tablet (80 mg total) by mouth daily.  budesonide-formoterol (SYMBICORT) 160-4.5 MCG/ACT inhaler Inhale 2 puffs into the lungs in the morning and at bedtime.   clobetasol cream (TEMOVATE) 0.05 % Apply 1 application topically daily as needed (rash).    clopidogrel (PLAVIX) 75 MG tablet Take 1 tablet (75 mg total) by mouth daily.   COSENTYX SENSOREADY, 300 MG, 150 MG/ML SOAJ Inject 300 mg into the skin every 30 (thirty) days.   cyclobenzaprine (FLEXERIL) 10 MG tablet Take 10 mg by mouth at bedtime.    desonide (DESOWEN) 0.05 % cream Apply 1 application topically daily as needed (rash).    docusate sodium (COLACE) 100 MG capsule Take 100 mg by mouth daily.    DOXYCYCLINE CALCIUM PO Take by mouth in the morning and at bedtime.   esomeprazole (NEXIUM) 40 MG capsule Take 40 mg by mouth daily before breakfast.   ezetimibe (ZETIA) 10 MG tablet Take 10 mg by mouth daily.   fluticasone (FLONASE) 50 MCG/ACT nasal spray Place 1  spray into both nostrils in the morning and at bedtime.   hydrocortisone (ANUCORT-HC) 25 MG suppository Place 1 suppository rectally 2 (two) times daily as needed for hemorrhoids.   hydrOXYzine (VISTARIL) 25 MG capsule Take 25 mg by mouth daily as needed for itching (sleep).   insulin lispro (HUMALOG KWIKPEN) 100 UNIT/ML KiwkPen Inject 18-24 Units into the skin 3 (three) times daily with meals.   isosorbide mononitrate (IMDUR) 30 MG 24 hr tablet Take 1 tablet (30 mg total) by mouth daily. Please schedule appointment for refills.   JARDIANCE 10 MG TABS tablet Take 10 mg by mouth daily.   LANTUS SOLOSTAR 100 UNIT/ML Solostar Pen Inject 22 Units into the skin at bedtime.   LYRICA 100 MG capsule Take 1 capsule (100 mg total) by mouth 2 (two) times daily.   metFORMIN (GLUCOPHAGE) 1000 MG tablet Take 1,000 mg by mouth 2 (two) times daily.   mirtazapine (REMERON) 30 MG tablet Take 1 tablet by mouth at bedtime.    nitroGLYCERIN (NITROSTAT) 0.4 MG SL tablet Place 1 tablet (0.4 mg total) under the tongue every 5 (five) minutes as needed for chest pain. (Patient taking differently: Place 0.4 mg under the tongue daily as needed (for difficulty breathing d/t fluid in lungs).)   nystatin cream (MYCOSTATIN) Apply 1 Application topically 2 (two) times daily.   nystatin-triamcinolone (MYCOLOG II) cream Apply 1 application topically daily as needed (rash).    oxyCODONE-acetaminophen (PERCOCET) 10-325 MG per tablet Take 1-2 tablets by mouth 2 (two) times daily as needed for pain.    polycarbophil (FIBERCON) 625 MG tablet Take 625 mg by mouth daily.   SOOLANTRA 1 % CREA Apply 1 application  topically daily.   tamsulosin (FLOMAX) 0.4 MG CAPS capsule Take 0.4 capsules by mouth daily.   tiZANidine (ZANAFLEX) 4 MG tablet Take 4 mg by mouth at bedtime.   Torsemide 40 MG TABS Take 40 mg by mouth daily.   triamcinolone cream (KENALOG) 0.1 % Apply 1 Application topically 2 (two) times daily as needed (for itching).   ULORIC  40 MG tablet Take 1 tablet by mouth Daily.   VIIBRYD 40 MG TABS Take 40 mg by mouth Daily.      Allergies:   Cefuroxime, Ibuprofen, Iodinated contrast media, Cefpodoxime, Ciprofloxacin, Doxycycline, Sulfamethoxazole-trimethoprim, Fluocinolone, Probenecid, Allopurinol, Azithromycin, and Sulfa antibiotics   Social History   Socioeconomic History   Marital status: Married    Spouse name: Not on file   Number of children: 2   Years of education: Not on file  Highest education level: Not on file  Occupational History   Occupation: Retired-Owned a lumberyard/sawmill  Tobacco Use   Smoking status: Never   Smokeless tobacco: Never  Substance and Sexual Activity   Alcohol use: Not Currently   Drug use: No   Sexual activity: Not Currently  Other Topics Concern   Not on file  Social History Narrative   Not on file   Social Determinants of Health   Financial Resource Strain: Not on file  Food Insecurity: No Food Insecurity (02/20/2023)   Hunger Vital Sign    Worried About Running Out of Food in the Last Year: Never true    Ran Out of Food in the Last Year: Never true  Transportation Needs: No Transportation Needs (02/20/2023)   PRAPARE - Administrator, Civil Service (Medical): No    Lack of Transportation (Non-Medical): No  Physical Activity: Not on file  Stress: Not on file  Social Connections: Not on file     Family History: The patient's family history includes Prostate cancer in his father; Valvular heart disease in his father.  ROS:   Please see the history of present illness.     EKGs/Labs/Other Studies Reviewed:    The following studies were reviewed:  Right/ Left Cardiac Catheterization 01/11/2023:   Prox LAD lesion is 100% stenosed.   Mid RCA lesion is 100% stenosed.   Origin to Prox Graft lesion is 100% stenosed.   Origin to Prox Graft lesion is 100% stenosed.   Ost 1st Diag lesion is 90% stenosed.   Ost 1st Mrg lesion is 100% stenosed.   Prox RCA  lesion is 60% stenosed.   Mid LAD to Dist LAD lesion is 50% stenosed.   Ost LM to Mid LM lesion is 40% stenosed.   SVG graft was not injected.   SVG graft was not injected.   LIMA graft was visualized by angiography.   Chronic total occlusion of the mid LAD. The mid and distal LAD has diffuse moderate disease and fills from the patent LIMA graft.  The small to moderate caliber diagonal branch has diffuse severe disease, unchanged from last cath. The SVG to the diagonal is known to be occluded.  The Circumflex has mild proximal stenosis. The first OM branch is chronically occluded and fills from the patent SVG graft.  The large dominant RCA has chronic total occlusion in the mid vessel. The SVG to the distal RCA is known to be occluded. The distal RCA branches fill from left to right collaterals.    RA 1 RV 30/0/3 PA 33/12 mean 24 PCWP 4 AO 105/54   Recommendations: Continue medical management of CAD. Diffuse three vessel CAD with continued patency of the LIMA to LAD and SVG to OM. No focal targets for PCI. Normal right heart pressures. Will continue workup for TAVR. He will likely need to have his Pre-TAVR CT scans staged a week from now given the contrast load today. He can be discharged home tomorrow if stable and we can see him in our structural heart office to discuss TAVR in detail and plan his scans.  Diagnostic Dominance: Right  _______________  Echocardiogram 02/22/2023: Impressions: 1. Left ventricular ejection fraction, by estimation, is 55 to 60%. The  left ventricle has normal function. The left ventricle demonstrates  regional wall motion abnormalities (see scoring diagram/findings for  description). Left ventricular diastolic  function could not be evaluated. There is moderate hypokinesis of the left  ventricular, mid-apical anterolateral wall.  2. Right ventricular systolic function is normal. The right ventricular  size is normal.   3. Left atrial size was severely  dilated.   4. The mitral valve is degenerative. Moderate mitral valve regurgitation.  Moderate mitral annular calcification.   5. The aortic valve has been repaired/replaced. Aortic valve  regurgitation is not visualized. There is a 29 mm Sapien prosthetic (TAVR)  valve present in the aortic position. Procedure Date: 02/21/2023. Aortic  valve mean gradient measures 3.5 mmHg. Aortic   valve Vmax measures 1.28 m/s. Aortic valve acceleration time measures 70  msec.   6. There is mild dilatation of the ascending aorta, measuring 42 mm.    EKG:  EKG ordered today. EKG personally reviewed and demonstrates atrial flutter, rate 60 bpm, with LVH T wave inversions in lead aVL.Left axis deviation. QTc 474 ms.  Recent Labs: 02/19/2023: ALT 48; B Natriuretic Peptide 331.6 02/20/2023: TSH 2.667 02/22/2023: BUN 44; Creatinine, Ser 1.46; Hemoglobin 11.0; Magnesium 2.1; Platelets 405; Potassium 4.1; Sodium 138  Recent Lipid Panel    Component Value Date/Time   CHOL 91 01/10/2023 0216   TRIG 149 01/10/2023 0216   HDL 24 (L) 01/10/2023 0216   CHOLHDL 3.8 01/10/2023 0216   VLDL 30 01/10/2023 0216   LDLCALC 37 01/10/2023 0216    Physical Exam:    Vital Signs: BP 130/62   Pulse 60   Ht 6\' 2"  (1.88 m)   Wt 227 lb 3.2 oz (103.1 kg)   SpO2 93%   BMI 29.17 kg/m     Wt Readings from Last 3 Encounters:  02/26/23 227 lb 3.2 oz (103.1 kg)  02/22/23 217 lb 13 oz (98.8 kg)  02/08/23 229 lb (103.9 kg)     General: 77 y.o. Caucasian male in no acute distress. HEENT: Normocephalic and atraumatic. Sclera clear. EOMs intact. Neck: Supple. No carotid bruits. No JVD. Heart: RRR. Soft I-II/VI systolic murmur. gallops, or rubs. Femoral cath sites soft with no drainage. Resolving ecchymosis of left groin. Lungs: No increased work of breathing. Clear to ausculation bilaterally. No wheezes, rhonchi, or rales.  Abdomen: Soft, non-distended, and non-tender to palpation.  Extremities: Trace lower extremity edema  bilaterally.    Skin: Warm and dry. Neuro: Alert and oriented x3. No focal deficits. Psych: Normal affect. Responds appropriately.  Assessment:    1. Severe aortic stenosis   2. S/P TAVR (transcatheter aortic valve replacement)   3. Coronary artery disease involving native coronary artery of native heart without angina pectoris   4. S/P CABG (coronary artery bypass graft)   5. Chronic diastolic CHF (congestive heart failure)   6. Atrial flutter, unspecified type   7. History of 2nd degree AV block   8. PAD (peripheral artery disease)   9. Primary hypertension   10. Hyperlipidemia, unspecified hyperlipidemia type   11. Type 2 diabetes mellitus with complication, with long-term current use of insulin   12. Stage 3 chronic kidney disease, unspecified whether stage 3a or 3b CKD   13. Hematuria, unspecified type   14. Thyroid nodule   15. Pulmonary nodule   16. Kidney lesion     Plan:    Severe Aortic Stenosis s/p TAVR S/p recent TAVR with a 29 mm Edwards Sapient 3 THV on 02/21/2023. Echo on POD #1 showed stable valve with mean gradient of 3.5 mmHg.  - Recovering well.  - Will need SBE prophylaxis for all future dental procedures/ cleanings. Will go ahead and provide prescription of Amoxicillin 2g to be taken 1 hour  prior to dental procedure/ cleanings.  - Continue to slowly increase physical activity as tolerated.  - Advised patient that he can go back to work next Monday 03/05/2023. Work note provided.  - Routine 6 week post-op Echo and follow-up with Structural Heart Team scheduled for 04/04/2023.   CAD s/p CABG S/p CABG x4 with LIMA to LAD, SVG to OM, SVG to Diag, and SVG to RCA in 2003 with subsequent DES to SVG to RCA in 2010. Last [facility] in 01/2023 showed 2/4 patent grafts (LIMA to LAD and SVG to OM) with known occlusion of SVG to distal RCA and SVG to 1st Diag. There were no focal targets for PCI and continued medical therapy was recommended.  - No chest pain.  - Continue Imdur   daily.  - Continue Plavix monotherapy. No aspirin given need for full anticoagulation.  - Continue statin/ Zetia.  Chronic Diastolic CHF Last Echo on 02/22/2023 showed LVEF of 55-60% with moderate hypokinesis of mid apical anterolateral wall and normal RV.  - Euvolemic on exam.  - Continue Torsemide  daily.  - Continue Jardiance  daily.  - Discussed importance of daily weights and sodium/ fluid restrictions. - Will check BMET today.   Persistent Atrial Flutter History of Intermittent 2nd Degree AV Block Type 1 and 2 Found to be in new onset atrial flutter on 02/19/2023.  - He is still in rate controlled atrial flutter today based on EKG. Ventricular rate 60 bpm. He is asymptomatic with this.  - No AV nodal agents given history of intermittent 2nd degree AV block type 1 and 2 noted during admission in 01/2023. - Continue chronic anticoagulation with Eliquis 5 twice daily. - Will check CBC today. - Discussed restoration of sinus rhythm via DCCV. However, given he just had a TAVR less than 1 week ago and is asymptomatic with this, will hold off for now. He left atrium was noted to be severely dilated on recent Echo so he may end up needing an antiarrhythmic to main sinus rhythm. He has a follow-up visit with the Structural Heart Team scheduled for next month. Can wait to see how he is doing at that time and then consider DCCV if he is still in atrial flutter.   PAD Patient reportedly has a history of severe bilateral lower extremity disease. He follows with [facility] Surgery in [place], [place] and has reportedly had multiple interventions; however, unclear of details. - He reports intermittent bilateral leg soreness due to low back issues which he states is not new. However, does not sound like claudication.  - Continue Plavix monotherapy. Aspirin stopped during recent admission due to need for full anticoagulation.  - Continue statin/ Zetia. - Will request records from [facility]  in [place], [place] for details.   Hypertension BP well controlled.  - Continue Imdur  daily.  Hyperlipidemia Lipid panel in 01/2023: Total Cholesterol 91, Triglycerides 149, HDL 24, LDL 37.  - Continue Lipitor  daily and Zetia  daily.  Type 2 Diabetes Mellitus Hemoglobin A1c 8.5% on 02/20/2023. - On Metformin, Jardiance, and Insulin.  - Management per PCP.  CKD Stage III Baseline creatinine around 1.7. Creatinine stable at 1.30 on 02/23/2023. - Will repeat BMET today to ensure renal function stable after increase in Torsemide.   Hematuria Patient was seen in the [facility] ED for hematuria on 02/23/2023 which was felt to be due to irritation from Foley during recent admission.  - No recurrence.  - Will check CBC today.   Thyroid Nodule  Pulmonary Nodule Lesion on Left Kidney Pre-TAVR CTs incidnetally showed a left sided thyroid nodule measuring 2.5cm, scattered small solid pulmonary nodules measuring up to 0.4 mm, and  indeterminate lesion of the anterior left kidney.  - Thyroid Ultrasound and renal protocol MRI were recommended for further evaluation. Discussed this with patient. He would like to follow up on this with his PCP who he is scheduled to see later this week.   Disposition: Follow up in 6 months.   Medication Adjustments/Labs and Tests Ordered: Current medicines are reviewed at length with the patient today.  Concerns regarding medicines are outlined above.  Orders Placed This Encounter  Procedures   Basic metabolic panel   CBC   EKG 12-Lead   Meds ordered this encounter  Medications   amoxicillin (AMOXIL) 500 MG tablet    Sig: Prophylaxis for dental procedure. Take 2 grams (4x  tablets) 1 hour before procedure.    Dispense:  4 tablet    Refill:  3    Patient Instructions  Medication Instructions:   TAKE Amoxicillin 2000 mg (4-500 mg tablets) 1 hour prior to dental procedures   *If you need a refill on your cardiac medications before your next  appointment, please call your pharmacy*  Lab Work: Marjie Skiff, PA-C recommends that you have lab work TODAY:  BMP CBC  If you have labs (blood work) drawn today and your tests are completely normal, you will receive your results only by: MyChart Message (if you have MyChart) OR A paper copy in the mail If you have any lab test that is abnormal or we need to change your treatment, we will call you to review the results.  Testing/Procedures: NONE ordered at this time of appointment   Follow-Up: At [facility], you and your health needs are our priority.  As part of our continuing mission to provide you with exceptional heart care, we have created designated Provider Care Teams.  These Care Teams include your primary Cardiologist (physician) and Advanced Practice Providers (APPs -  Physician Assistants and Nurse Practitioners) who all work together to provide you with the care you need, when you need it.   Your next appointment:   6 month(s)  Provider:   Maisie Fus, MD  or Marjie Skiff, PA-C        Other Instructions From recent CT scan please discuss with PCP about need for a Thyroid Ultrasound and MRI Nonvascular:  1. Left-sided thyroid nodule measuring 2.5 cm. Recommend thyroid ultrasound further evaluation. 2. Indeterminate lesion of the anterior left kidney is increased in size when compared with prior exam. Recommend renal protocol MRI for further evaluation. 3. Bilateral mosaic attenuation, likely due to air trapping which can be seen in the setting of small airways disease. 4. Scattered small solid pulmonary nodules measuring up to 4 mm. No follow-up needed if patient is low-risk. Non-contrast chest CT can be considered in 12 months if patient is high-risk. This recommendation follows the consensus statement: Guidelines for Management of Incidental Pulmonary Nodules Detected on CT Images: From the Fleischner Society 2017; Radiology 2017;  [id number].    Signed, Corrin Parker, PA-C  02/26/2023 1:30 PM    [facility]

## 2023-02-19 NOTE — ED Triage Notes (Signed)
Patient with scheduled TAVR planned for 4/17 here for evaluation of shortness of breath that is worse today than normal. Patient states he often has excess fluid that makes him short of breath but today it is worse. Patient reports an episode of diaphoresis earlier today, currently is alert, oriented, and in no apparent distress at this time.

## 2023-02-19 NOTE — Telephone Encounter (Signed)
I contacted the patient to make him aware of PAT results (Covid and CXR pending).  The pt's creatinine was stable at 1.54. This morning I met with the patient in PAT due to new findings of AFlutter on EKG and continued SOB.  His weight is up 4 lbs from baseline and SOB is the same as last week. He was not in acute distress at  time of PAT visit. The patient has been doubling up on Torsemide for a week due to SOB and weight gain. I advised him at PAT that I would call him with lab results before having him take an additional dosage.  At the time of call the patient is at home and he is sitting down but states that he is clammy and sweating all over.  I advised the patient that he needs to seek evaluation in the ER due to these symptoms.  The patient plans to contact his wife and have her drive him to the  [facility] ER.  I advised him that if he has any change then he would need to call 911. Pt agreed with plan.   Cardiology team will be notified.

## 2023-02-20 ENCOUNTER — Other Ambulatory Visit (HOSPITAL_COMMUNITY): Payer: Self-pay

## 2023-02-20 ENCOUNTER — Other Ambulatory Visit: Payer: Self-pay

## 2023-02-20 ENCOUNTER — Encounter (HOSPITAL_COMMUNITY): Payer: Self-pay | Admitting: Cardiology

## 2023-02-20 DIAGNOSIS — I350 Nonrheumatic aortic (valve) stenosis: Principal | ICD-10-CM

## 2023-02-20 DIAGNOSIS — I10 Essential (primary) hypertension: Secondary | ICD-10-CM

## 2023-02-20 LAB — BLOOD GAS, ARTERIAL
Acid-Base Excess: 1.1 mmol/L (ref 0.0–2.0)
Bicarbonate: 25.1 mmol/L (ref 20.0–28.0)
O2 Saturation: 96.1 %
Patient temperature: 36.8
pCO2 arterial: 37 mmHg (ref 32–48)
pH, Arterial: 7.44 (ref 7.35–7.45)
pO2, Arterial: 69 mmHg — ABNORMAL LOW (ref 83–108)

## 2023-02-20 LAB — BASIC METABOLIC PANEL
Anion gap: 10 (ref 5–15)
Anion gap: 9 (ref 5–15)
BUN: 35 mg/dL — ABNORMAL HIGH (ref 8–23)
BUN: 37 mg/dL — ABNORMAL HIGH (ref 8–23)
CO2: 27 mmol/L (ref 22–32)
CO2: 27 mmol/L (ref 22–32)
Calcium: 9.2 mg/dL (ref 8.9–10.3)
Calcium: 9.2 mg/dL (ref 8.9–10.3)
Chloride: 102 mmol/L (ref 98–111)
Chloride: 103 mmol/L (ref 98–111)
Creatinine, Ser: 1.37 mg/dL — ABNORMAL HIGH (ref 0.61–1.24)
Creatinine, Ser: 1.57 mg/dL — ABNORMAL HIGH (ref 0.61–1.24)
GFR, Estimated: 54 mL/min — ABNORMAL LOW (ref >60)
GFR, Estimated: 46 mL/min — ABNORMAL LOW (ref >60)
Glucose, Bld: 320 mg/dL — ABNORMAL HIGH (ref 70–99)
Glucose, Bld: 193 mg/dL — ABNORMAL HIGH (ref 70–99)
Potassium: 4.4 mmol/L (ref 3.5–5.1)
Potassium: 4.1 mmol/L (ref 3.5–5.1)
Sodium: 139 mmol/L (ref 135–145)
Sodium: 139 mmol/L (ref 135–145)

## 2023-02-20 LAB — GLUCOSE, CAPILLARY
Glucose-Capillary: 301 mg/dL — ABNORMAL HIGH (ref 70–99)
Glucose-Capillary: 263 mg/dL — ABNORMAL HIGH (ref 70–99)
Glucose-Capillary: 102 mg/dL — ABNORMAL HIGH (ref 70–99)
Glucose-Capillary: 237 mg/dL — ABNORMAL HIGH (ref 70–99)

## 2023-02-20 LAB — TYPE AND SCREEN
ABO/RH(D): A POS
Antibody Screen: NEGATIVE

## 2023-02-20 LAB — CBC
HCT: 36.7 % — ABNORMAL LOW (ref 39.0–52.0)
Hemoglobin: 11.6 g/dL — ABNORMAL LOW (ref 13.0–17.0)
MCH: 26 pg (ref 26.0–34.0)
MCHC: 31.6 g/dL (ref 30.0–36.0)
MCV: 82.3 fL (ref 80.0–100.0)
Platelets: 470 10*3/uL — ABNORMAL HIGH (ref 150–400)
RBC: 4.46 MIL/uL (ref 4.22–5.81)
RDW: 19.8 % — ABNORMAL HIGH (ref 11.5–15.5)
WBC: 8.6 10*3/uL (ref 4.0–10.5)
nRBC: 0 % (ref 0.0–0.2)

## 2023-02-20 LAB — HEMOGLOBIN A1C
Hgb A1c MFr Bld: 8.5 % — ABNORMAL HIGH (ref 4.8–5.6)
Mean Plasma Glucose: 197.25 mg/dL

## 2023-02-20 LAB — TSH: TSH: 2.667 u[IU]/mL (ref 0.350–4.500)

## 2023-02-20 LAB — HEPARIN LEVEL (UNFRACTIONATED)
Heparin Unfractionated: 0.29 IU/mL — ABNORMAL LOW (ref 0.30–0.70)
Heparin Unfractionated: 0.13 IU/mL — ABNORMAL LOW (ref 0.30–0.70)
Heparin Unfractionated: 0.59 IU/mL (ref 0.30–0.70)

## 2023-02-20 LAB — MAGNESIUM: Magnesium: 1.8 mg/dL (ref 1.7–2.4)

## 2023-02-20 LAB — APTT: aPTT: 76 seconds — ABNORMAL HIGH (ref 24–36)

## 2023-02-20 MED ORDER — BISACODYL 5 MG PO TBEC
5.0000 mg | DELAYED_RELEASE_TABLET | Freq: Once | ORAL | Status: AC
  Administered 2023-02-20: 5 mg via ORAL
  Filled 2023-02-20: qty 1

## 2023-02-20 MED ORDER — ACETAMINOPHEN 500 MG PO TABS
1000.0000 mg | ORAL_TABLET | Freq: Once | ORAL | Status: AC
  Administered 2023-02-21: 1000 mg via ORAL
  Filled 2023-02-20: qty 2

## 2023-02-20 MED ORDER — NOREPINEPHRINE 4 MG/250ML-% IV SOLN
0.0000 ug/min | INTRAVENOUS | Status: AC
  Administered 2023-02-21: 1 ug/min via INTRAVENOUS
  Filled 2023-02-20: qty 250

## 2023-02-20 MED ORDER — HYDROXYZINE HCL 25 MG PO TABS
25.0000 mg | ORAL_TABLET | Freq: Every evening | ORAL | Status: AC | PRN
  Administered 2023-02-20: 25 mg via ORAL
  Filled 2023-02-20: qty 1

## 2023-02-20 MED ORDER — DEXMEDETOMIDINE HCL IN NACL 400 MCG/100ML IV SOLN
0.1000 ug/kg/h | INTRAVENOUS | Status: AC
  Administered 2023-02-21: 103.92 ug via INTRAVENOUS
  Filled 2023-02-20: qty 100

## 2023-02-20 MED ORDER — ONDANSETRON HCL 4 MG/2ML IJ SOLN: 4.0000 mg | Freq: Four times a day (QID) | INTRAMUSCULAR | Status: DC | PRN

## 2023-02-20 MED ORDER — SODIUM CHLORIDE 0.9 % IV SOLN: INTRAVENOUS | Status: DC

## 2023-02-20 MED ORDER — CEFAZOLIN SODIUM-DEXTROSE 2-4 GM/100ML-% IV SOLN
2.0000 g | INTRAVENOUS | Status: AC
  Administered 2023-02-21: 2 g via INTRAVENOUS
  Filled 2023-02-20: qty 100

## 2023-02-20 MED ORDER — ACETAMINOPHEN 325 MG PO TABS
650.0000 mg | ORAL_TABLET | ORAL | Status: DC | PRN
  Administered 2023-02-20 – 2023-02-22 (×2): 650 mg via ORAL
  Filled 2023-02-20 (×2): qty 2

## 2023-02-20 MED ORDER — VANCOMYCIN HCL 1500 MG/300ML IV SOLN
1500.0000 mg | INTRAVENOUS | Status: DC
  Filled 2023-02-20: qty 300

## 2023-02-20 MED ORDER — CHLORHEXIDINE GLUCONATE 4 % EX LIQD
60.0000 mL | Freq: Once | CUTANEOUS | Status: AC
  Administered 2023-02-21: 4 via TOPICAL
  Filled 2023-02-20: qty 60

## 2023-02-20 MED ORDER — MAGNESIUM SULFATE 50 % IJ SOLN
40.0000 meq | INTRAMUSCULAR | Status: DC
  Filled 2023-02-20: qty 9.85

## 2023-02-20 MED ORDER — PREDNISONE 20 MG PO TABS
50.0000 mg | ORAL_TABLET | Freq: Once | ORAL | Status: AC
  Administered 2023-02-20: 50 mg via ORAL

## 2023-02-20 MED ORDER — DIPHENHYDRAMINE HCL 25 MG PO CAPS
50.0000 mg | ORAL_CAPSULE | ORAL | Status: AC
  Administered 2023-02-21: 50 mg via ORAL
  Filled 2023-02-20: qty 2

## 2023-02-20 MED ORDER — PREDNISONE 20 MG PO TABS
50.0000 mg | ORAL_TABLET | Freq: Four times a day (QID) | ORAL | Status: AC
  Administered 2023-02-21 (×2): 50 mg via ORAL
  Filled 2023-02-20 (×3): qty 1

## 2023-02-20 MED ORDER — TEMAZEPAM 15 MG PO CAPS
15.0000 mg | ORAL_CAPSULE | Freq: Once | ORAL | Status: AC | PRN
  Administered 2023-02-20: 15 mg via ORAL
  Filled 2023-02-20: qty 1

## 2023-02-20 MED ORDER — CHLORHEXIDINE GLUCONATE 0.12 % MT SOLN
15.0000 mL | Freq: Once | OROMUCOSAL | Status: AC
  Administered 2023-02-21: 15 mL via OROMUCOSAL
  Filled 2023-02-20: qty 15

## 2023-02-20 MED ORDER — DIPHENHYDRAMINE HCL 25 MG PO CAPS: 50.0000 mg | ORAL_CAPSULE | ORAL | Status: AC

## 2023-02-20 MED ORDER — POTASSIUM CHLORIDE 2 MEQ/ML IV SOLN
80.0000 meq | INTRAVENOUS | Status: DC
  Filled 2023-02-20: qty 40

## 2023-02-20 MED ORDER — SODIUM CHLORIDE 0.9% FLUSH
3.0000 mL | Freq: Two times a day (BID) | INTRAVENOUS | Status: DC
  Administered 2023-02-20 – 2023-02-22 (×3): 3 mL via INTRAVENOUS

## 2023-02-20 MED ORDER — HEPARIN BOLUS VIA INFUSION
3000.0000 [IU] | Freq: Once | INTRAVENOUS | Status: AC
  Administered 2023-02-20: 3000 [IU] via INTRAVENOUS
  Filled 2023-02-20: qty 3000

## 2023-02-20 MED ORDER — CHLORHEXIDINE GLUCONATE 0.12 % MT SOLN: 15.0000 mL | Freq: Once | OROMUCOSAL | Status: AC

## 2023-02-20 MED ORDER — SODIUM CHLORIDE 0.9 % IV SOLN: 250.0000 mL | INTRAVENOUS | Status: DC | PRN

## 2023-02-20 MED ORDER — ASPIRIN 81 MG PO TBEC: 81.0000 mg | DELAYED_RELEASE_TABLET | Freq: Every day | ORAL | Status: DC

## 2023-02-20 MED ORDER — CHLORHEXIDINE GLUCONATE CLOTH 2 % EX PADS
6.0000 | MEDICATED_PAD | Freq: Once | CUTANEOUS | Status: AC
  Administered 2023-02-20: 6 via TOPICAL

## 2023-02-20 MED ORDER — DEXTROSE 5 % IV SOLN
2.0000 g | Freq: Once | INTRAVENOUS | Status: DC
  Filled 2023-02-20: qty 20

## 2023-02-20 MED ORDER — CHLORHEXIDINE GLUCONATE 4 % EX LIQD
30.0000 mL | CUTANEOUS | Status: DC
  Filled 2023-02-20 (×2): qty 30

## 2023-02-20 MED ORDER — CHLORHEXIDINE GLUCONATE 4 % EX LIQD
60.0000 mL | Freq: Once | CUTANEOUS | Status: AC
  Administered 2023-02-20: 4 via TOPICAL
  Filled 2023-02-20 (×3): qty 60

## 2023-02-20 MED ORDER — SODIUM CHLORIDE 0.9% FLUSH: 3.0000 mL | INTRAVENOUS | Status: DC | PRN

## 2023-02-20 MED ORDER — FUROSEMIDE 10 MG/ML IJ SOLN
60.0000 mg | Freq: Once | INTRAMUSCULAR | Status: AC
  Administered 2023-02-20: 60 mg via INTRAVENOUS
  Filled 2023-02-20: qty 6

## 2023-02-20 MED ORDER — HEPARIN 30,000 UNITS/1000 ML (OHS) CELLSAVER SOLUTION
Status: DC
  Filled 2023-02-20: qty 1000

## 2023-02-20 NOTE — Progress Notes (Signed)
Rounding Note    Patient Name: Dylan Bruce Date of Encounter: 02/20/2023  [facility] Cardiologist: Maisie Fus, MD   Subjective   Feeling much better today. No dyspnea at rest. No chest pain  Inpatient Medications    Scheduled Meds:  aspirin EC  81 mg Oral Daily   clopidogrel  75 mg Oral Daily   [START ON 02/21/2023] diphenhydrAMINE  50 mg Oral On Call   docusate sodium  100 mg Oral Daily   ezetimibe  10 mg Oral Daily   febuxostat  40 mg Oral Daily   insulin aspart  0-15 Units Subcutaneous TID WC   insulin aspart  0-5 Units Subcutaneous QHS   isosorbide mononitrate  30 mg Oral Daily   mirtazapine  30 mg Oral QHS   pantoprazole  40 mg Oral Daily   polycarbophil  625 mg Oral Daily   predniSONE  50 mg Oral Q6H   pregabalin  100 mg Oral BID   Continuous Infusions:  heparin 1,800 Units/hr (02/20/23 0532)   PRN Meds: albuterol, nitroGLYCERIN   Vital Signs    Vitals:   02/20/23 0413 02/20/23 0449 02/20/23 0530 02/20/23 1000  BP: (!) 151/101  (!) 158/97 (!) 136/90  Pulse: 68  69 66  Resp: Temp:  (!) 97.5 F (36.4 C)  98.8 F (37.1 C)  TempSrc:  Oral  Oral  SpO2: 99%  98% 96%  Weight:      Height:        Intake/Output Summary (Last 24 hours) at 02/20/2023 1112 Last data filed at 02/20/2023 0415 Gross per 24 hour  Intake --  Output 1225 ml  Net -1225 ml      02/19/2023    8:03 PM 02/08/2023    3:05 PM 01/22/2023    2:02 PM  Last 3 Weights  Weight (lbs) 229 lb 0.9 oz 229 lb 229 lb  Weight (kg) 103.9 kg 103.874 kg 103.874 kg      Telemetry    Sinus - Personally Reviewed  ECG    No am EKG - Personally Reviewed  Physical Exam   GEN: No acute distress.   Neck: No JVD Cardiac: RRR, systolic murmur.   Respiratory: Overall clear with few scattered end expiratory wheezes GI: Soft, nontender, non-distended  EXT: Trace bilateral LE edema Neuro:  Nonfocal  Psych: Normal affect   Labs    High Sensitivity Troponin:   Recent  Labs  Lab 02/19/23 1434 02/19/23 1644  TROPONINIHS 46* 44*     Chemistry Recent Labs  Lab 02/19/23 0823 02/19/23 1434  NA 141 137  K 3.8 3.9  CL 106 102  CO2 25 22  GLUCOSE 164* 280*  BUN 40* 44*  CREATININE 1.54* 1.75*  CALCIUM 9.1 8.9  PROT 6.9 6.6  ALBUMIN 3.3* 3.2*  AST 35 39  ALT 49* 48*  ALKPHOS 112 111  BILITOT 0.7 0.6  GFRNONAA 47* 40*  ANIONGAP 10 13    Lipids No results for input(s): "CHOL", "TRIG", "HDL", "LABVLDL", "LDLCALC", "CHOLHDL" in the last 168 hours.  Hematology Recent Labs  Lab 02/19/23 0823 02/19/23 1434 02/20/23 0311  WBC 9.3 12.9* 8.6  RBC 4.51 4.30 4.46  HGB 11.4* 11.2* 11.6*  HCT 38.6* 36.5* 36.7*  MCV 85.6 84.9 82.3  MCH 25.3* 26.0 26.0  MCHC 29.5* 30.7 31.6  RDW 19.9* 20.0* 19.8*  PLT 505* 469* 470*   Thyroid No results for input(s): "TSH", "FREET4" in the last 168  hours.  BNP Recent Labs  Lab 02/19/23 1443  BNP 331.6*    DDimer No results for input(s): "DDIMER" in the last 168 hours.   Radiology    DG Chest 2 View  Result Date: 02/19/2023 CLINICAL DATA:  Preop exam. EXAM: CHEST - 2 VIEW COMPARISON:  January 01, 2021. FINDINGS: Mild peripheral interstitial prominence. No confluent consolidation. No visible pleural effusions or pneumothorax. Cardiomediastinal silhouette is unchanged. CABG and median sternotomy. Partially imaged left reverse shoulder arthroplasty. IMPRESSION: Mild peripheral interstitial prominence, suggestive of mild interstitial edema. No confluent consolidation. Electronically Signed   By: Feliberto Harts M.D.   On: 02/19/2023 14:33    Cardiac Studies     Patient Profile     77 y.o. male with severe AS, chronic diastolic CHF, CAD s/p 4V CABG, HTN, HLD, DM, sleep apnea, PAD and CKD stage 3 admitted with volume overload. Planning for TAVR on 02/21/23. Pt with weight gain and dyspnea at home. Admitted 02/20/23 for diuresis and fluid optimization prior to TAVR.   Assessment & Plan    Acute on chronic  diastolic CHF: He was given one dose of IV Lasix yesterday and has had good urine output, negative 1200 cc since admission. Will give one more dose of IV Lasix today if renal function is stable, BMET pending.   Severe aortic stenosis: Planning for TAVR tomorrow. NPO at midnight.   HTN: BP stable.   CKD stage 3: Renal function mildly worsened on admission. BMET pending this am.   For questions or updates, please contact [facility] Please consult [email or web address] for contact info under     Signed, Verne Carrow, MD  02/20/2023, 11:12 AM

## 2023-02-20 NOTE — Inpatient Diabetes Management (Signed)
Inpatient Diabetes Program Recommendations  AACE/ADA: New Consensus Statement on Inpatient Glycemic Control (2015)  Target Ranges:  Prepandial:   less than 140 mg/dL      Peak postprandial:   less than 180 mg/dL (1-2 hours)      Critically ill patients:  140 - 180 mg/dL   Lab Results  Component Value Date   GLUCAP 301 (H) 02/20/2023   HGBA1C 9.0 (H) 01/08/2023    Review of Glycemic Control  Diabetes history: DM2 Outpatient Diabetes medications: Lantus 22 units QHS, Humalog 18-24 units TID, Jardiance 10 QD, metformin 1000 mg BID Current orders for Inpatient glycemic control: Novolog 0-15 TID with meals and 0-5 HS  On Prednisone 50 Q6H HgbA1C - 9.0%  Inpatient Diabetes Program Recommendations:    Consider adding Semglee 16 units QHS  Change Novolog to 0-15 Q4H when NPO for surgery.  Will follow.  Thank you. Ailene Ards, RD, LDN, CDCES Inpatient Diabetes Coordinator [phone]

## 2023-02-20 NOTE — Progress Notes (Signed)
Pt arrived to unit from ED,VSS, A/O x 4,  CCMD called ,CHG given, pt oriented to unit,Will continue to monitor.   Karna Christmas Dalayna Lauter, RN    02/20/23 1500  Vitals  Temp 98.7 F (37.1 C)  Temp Source Oral  BP (!) 132/95  MAP (mmHg) 107  BP Location Right Arm  BP Method Automatic  Patient Position (if appropriate) Sitting  Pulse Rate 71  Pulse Rate Source Monitor  ECG Heart Rate 71  Resp 17  Level of Consciousness  Level of Consciousness Alert  Oxygen Therapy  SpO2 90 %  O2 Device Room Air  O2 Flow Rate (L/min) 0 L/min  Pain Assessment  Pain Scale 0-10  Pain Score 0  MEWS Score  MEWS Temp 0  MEWS Systolic 0  MEWS Pulse 0  MEWS RR 0  MEWS LOC 0  MEWS Score 0  MEWS Score Color Chilton Si

## 2023-02-20 NOTE — ED Notes (Signed)
CBG 307 

## 2023-02-20 NOTE — TOC Benefit Eligibility Note (Signed)
Patient Product/process development scientist completed.    The patient is currently admitted and upon discharge could be taking Eliquis 5 mg.  The current 30 day co-pay is $20.00.   The patient is currently admitted and upon discharge could be taking Xarelto 20 mg.  The current 30 day co-pay is $20.00.   The patient is insured through [employer] Part D   This test claim was processed through [facility]- copay amounts may vary at other pharmacies due to pharmacy/plan contracts, or as the patient moves through the different stages of their insurance plan.  Roland Earl, CPHT Pharmacy Patient Advocate Specialist [facility] Patient Advocate Team Direct Number: [phone]  Fax: [phone]

## 2023-02-20 NOTE — ED Notes (Signed)
Rerequested meds from main pharmacy

## 2023-02-20 NOTE — Progress Notes (Signed)
ANTICOAGULATION CONSULT NOTE  Pharmacy Consult for Heparin Indication: atrial fibrillation  Allergies  Allergen Reactions   Cefuroxime Diarrhea and Nausea And Vomiting    Severe   Ibuprofen Anaphylaxis and Shortness Of Breath        Iodinated Contrast Media Shortness Of Breath and Other (See Comments)    Pt developed burning of face and SOB after 02/06/2023 CT scan    Cefpodoxime Rash   Ciprofloxacin Rash   Doxycycline Rash   Sulfamethoxazole-Trimethoprim Rash   Fluocinolone Other (See Comments)    Unknown reaction   Probenecid Other (See Comments)    Unknown reaction   Allopurinol Rash   Azithromycin Rash   Sulfa Antibiotics Rash    Patient Measurements: Height:  (188 cm) Weight: 103.9 kg (229 lb 0.9 oz) IBW/kg (Calculated) : 82.2 Heparin Dosing Weight: 103.1 kg  Vital Signs: Temp: 98.8 F (37.1 C) (04/16 1000) Temp Source: Oral (04/16 1000) BP: 136/90 (04/16 1000) Pulse Rate: 66 (04/16 1000)  Labs: Recent Labs    02/19/23 0823 02/19/23 1434 02/19/23 1644 02/20/23 0311 02/20/23 1115  HGB 11.4* 11.2*  --  11.6*  --   HCT 38.6* 36.5*  --  36.7*  --   PLT 505* 469*  --  470*  --   LABPROT 14.8  --   --   --   --   INR 1.2  --   --   --   --   HEPARINUNFRC  --   --   --  0.29* 0.13*  CREATININE 1.54* 1.Dylan*  --   --  1.37*  TROPONINIHS  --  46* 44*  --   --      Estimated Creatinine Clearance: 59.9 mL/min (A) (by C-G formula based on SCr of 1.37 mg/dL (H)).   Medical History: Past Medical History:  Diagnosis Date   Aortic stenosis    ARTHRITIS    ASTHMA    CAD (coronary artery disease)    CHF (congestive heart failure)    DM    GERD    HYPERLIPIDEMIA    Hypertension    PSORIASIS    SLEEP APNEA    SPINA BIFIDA     Medications:  (Not in a hospital admission)  Scheduled:   aspirin EC  81 mg Oral Daily   clopidogrel  Dylan mg Oral Daily   [START ON 02/21/2023] diphenhydrAMINE  50 mg Oral On Call   docusate sodium  100 mg Oral Daily    ezetimibe  10 mg Oral Daily   febuxostat  40 mg Oral Daily   insulin aspart  0-15 Units Subcutaneous TID WC   insulin aspart  0-5 Units Subcutaneous QHS   isosorbide mononitrate  30 mg Oral Daily   [START ON 02/21/2023] magnesium sulfate  40 mEq Other To OR   mirtazapine  30 mg Oral QHS   pantoprazole  40 mg Oral Daily   polycarbophil  625 mg Oral Daily   [START ON 02/21/2023] potassium chloride  80 mEq Other To OR   predniSONE  50 mg Oral Q6H   pregabalin  100 mg Oral BID   Infusions:   [START ON 02/21/2023] dexmedetomidine     [START ON 02/21/2023] heparin 30,000 units/NS 1000 mL solution for CELLSAVER     heparin 1,800 Units/hr (02/20/23 0532)   [START ON 02/21/2023] norepinephrine (LEVOPHED) Adult infusion     [START ON 02/21/2023] vancomycin     PRN: albuterol, nitroGLYCERIN  Assessment: Dylan Bruce with a history of CAD  s/p CABG (03), HTN, HLD, CKD, 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 Cardiology was planning for TAVR on 02/21/23. Patient is presenting with SOB / AF. Heparin per pharmacy consult placed for atrial fibrillation.  Patient is not on anticoagulation prior to arrival.  Repeat heparin level is subtherapeutic at 0.13, no infusion issues noted.  Goal of Therapy:  Heparin level 0.3-0.7 units/ml Monitor platelets by anticoagulation protocol: Yes   Plan:  Rebolus heparin 3000 units x1 Increase heparin to 2100 units/h Recheck heparin level in 8h  Fredonia Highland, PharmD, [place] Clinical Pharmacist [phone] Please check AMION for all [facility] Pharmacy numbers 02/20/2023

## 2023-02-20 NOTE — Anesthesia Preprocedure Evaluation (Signed)
Anesthesia Evaluation  Patient identified by MRN, date of birth, ID band Patient awake    Reviewed: Allergy & Precautions, NPO status , Patient's Chart, lab work & pertinent test results  History of Anesthesia Complications Negative for: history of anesthetic complications  Airway Mallampati: II  TM Distance: >3 FB Neck ROM: Full    Dental  (+) Missing, Dental Advisory Given   Pulmonary sleep apnea and Continuous Positive Airway Pressure Ventilation , COPD,  COPD inhaler   breath sounds clear to auscultation       Cardiovascular hypertension, + CAD and + CABG  + Cardiac Defibrillator + Valvular Problems/Murmurs (severe) AS  Rhythm:Regular Rate:Normal + Systolic murmurs 11/6107 Cath:  Chronic total occlusion of the mid LAD. The mid and distal LAD has diffuse moderate disease and fills from the patent LIMA graft.  The small to moderate caliber diagonal branch has diffuse severe disease, unchanged from last cath. The SVG to the diagonal is known to be occluded.  The Circumflex has mild proximal stenosis. The first OM branch is chronically occluded and fills from the patent SVG graft.  The large dominant RCA has chronic total occlusion in the mid vessel. The SVG to the distal RCA is known to be occluded. The distal RCA branches fill from left to right collaterals.   01/2023 ECHO: Mid/distal lateral wall appears to be hypokinetic.Marland Kitchen Left ventricular EF 55%. The left ventricle has normal function. There is mild concentric LVH. Normal RVH, mild MR, severe AS with Peak and mean gradients 59 and 32 mm Hg respectively . AVA (VTI) is 0.8 cm2. Dimensionless index is 0.18 consistent with severe AS.Marland Kitchen Aortic valve regurgitation is not visualized. Aortic dilatation noted. There is mild dilatation of the aortic root, measuring 41 mm. There is mild dilatation of the ascending aorta, measuring 41 mm.       Neuro/Psych   Anxiety Depression    Spina  bifida    GI/Hepatic Neg liver ROS,GERD  Medicated and Controlled,,  Endo/Other  diabetes (glu 361), Insulin Dependent, Oral Hypoglycemic Agents    Renal/GU Renal InsufficiencyRenal disease     Musculoskeletal   Abdominal   Peds  Hematology negative hematology ROS (+)   Anesthesia Other Findings   Reproductive/Obstetrics                              Anesthesia Physical Anesthesia Plan  ASA: 4  Anesthesia Plan: MAC   Post-op Pain Management: Tylenol PO (pre-op)*   Induction:   PONV Risk Score and Plan: 1 and Ondansetron and Treatment may vary due to age or medical condition  Airway Management Planned: Natural Airway and Simple Face Mask  Additional Equipment: Arterial line  Intra-op Plan:   Post-operative Plan:   Informed Consent: I have reviewed the patients History and Physical, chart, labs and discussed the procedure including the risks, benefits and alternatives for the proposed anesthesia with the patient or authorized representative who has indicated his/her understanding and acceptance.     Dental advisory given  Plan Discussed with: CRNA and Surgeon  Anesthesia Plan Comments:          Anesthesia Quick Evaluation

## 2023-02-20 NOTE — Progress Notes (Signed)
ANTICOAGULATION CONSULT NOTE - Follow Up Consult  Pharmacy Consult for heparin Indication: atrial fibrillation  Labs: Recent Labs    02/19/23 0823 02/19/23 1434 02/19/23 1644 02/20/23 0311  HGB 11.4* 11.2*  --  11.6*  HCT 38.6* 36.5*  --  36.7*  PLT 505* 469*  --  470*  LABPROT 14.8  --   --   --   INR 1.2  --   --   --   HEPARINUNFRC  --   --   --  0.29*  CREATININE 1.54* 1.75*  --   --   TROPONINIHS  --  46* 44*  --     Assessment: 77yo male subtherapeutic on heparin with initial dosing for Afib; no infusion issues or signs of bleeding per RN.  Goal of Therapy:  Heparin level 0.3-0.7 units/ml   Plan:  Will increase heparin infusion by 1-2 units/kg/hr to 1800 units/hr and check level in 8 hours.    Vernard Gambles, PharmD, BCPS  02/20/2023,4:06 AM

## 2023-02-20 NOTE — ED Notes (Signed)
CBG 320  

## 2023-02-20 NOTE — ED Notes (Signed)
ED TO INPATIENT HANDOFF REPORT  ED Nurse Name and Phone #: Leeroy Bock [phone]  S Name/Age/Gender Dylan Bruce 77 y.o. male Room/Bed: 003C/003C  Code Status   Code Status: Full Code  Home/SNF/Other Home Patient oriented to: self, place, time, and situation Is this baseline? Yes   Triage Complete: Triage complete  Chief Complaint Acute CHF (congestive heart failure) [I50.9]  Triage Note Patient with scheduled TAVR planned for 4/17 here for evaluation of shortness of breath that is worse today than normal. Patient states he often has excess fluid that makes him short of breath but today it is worse. Patient reports an episode of diaphoresis earlier today, currently is alert, oriented, and in no apparent distress at this time.   Allergies Allergies  Allergen Reactions   Cefuroxime Diarrhea and Nausea And Vomiting    Severe   Ibuprofen Anaphylaxis and Shortness Of Breath        Iodinated Contrast Media Shortness Of Breath and Other (See Comments)    Pt developed burning of face and SOB after 02/06/2023 CT scan    Cefpodoxime Rash   Ciprofloxacin Rash   Doxycycline Rash   Sulfamethoxazole-Trimethoprim Rash   Fluocinolone Other (See Comments)    Unknown reaction   Probenecid Other (See Comments)    Unknown reaction   Allopurinol Rash   Azithromycin Rash   Sulfa Antibiotics Rash    Level of Care/Admitting Diagnosis ED Disposition     ED Disposition  Admit   Condition  --   Comment  Hospital Area: [facility] [[id number]]  Level of Care: Progressive [102]  Admit to Progressive based on following criteria: CARDIOVASCULAR & THORACIC of moderate stability with acute coronary syndrome symptoms/low risk myocardial infarction/hypertensive urgency/arrhythmias/heart failure potentially compromising stability and stable post cardiovascular intervention patients.  May admit patient to [facility] or [facility] if equivalent level of care is available:: No  Covid  Evaluation: Symptomatic Person Under Investigation (PUI) or recent exposure (last 10 days) *Testing Required*  Diagnosis: Acute CHF (congestive heart failure) [[id number]]  Admitting Physician: Jonelle Sidle [[id number]]  Attending Physician: Jonelle Sidle [[id number]]  Certification:: I certify this patient will need inpatient services for at least 2 midnights  Estimated Length of Stay: 4          B Medical/Surgery History Past Medical History:  Diagnosis Date   Aortic stenosis    ARTHRITIS    ASTHMA    CAD (coronary artery disease)    CHF (congestive heart failure)    DM    GERD    HYPERLIPIDEMIA    Hypertension    PSORIASIS    SLEEP APNEA    SPINA BIFIDA    Past Surgical History:  Procedure Laterality Date   BACK SURGERY     CARPAL TUNNEL RELEASE Left 08/21/2019   Procedure: CARPAL TUNNEL RELEASE;  Surgeon: Cindee Salt, MD;  Location: [facility];  Service: Orthopedics;  Laterality: Left;  AXILLARY BLOCK   CORONARY ARTERY BYPASS GRAFT     JOINT REPLACEMENT Left    shoulder   LEFT HEART CATH AND CORS/GRAFTS ANGIOGRAPHY N/A 06/18/2018   Procedure: LEFT HEART CATH AND CORS/GRAFTS ANGIOGRAPHY;  Surgeon: Runell Gess, MD;  Location: [facility];  Service: Cardiovascular;  Laterality: N/A;   RIGHT/LEFT HEART CATH AND CORONARY/GRAFT ANGIOGRAPHY N/A 01/11/2023   Procedure: RIGHT/LEFT HEART CATH AND CORONARY/GRAFT ANGIOGRAPHY;  Surgeon: Kathleene Hazel, MD;  Location: [facility];  Service: Cardiovascular;  Laterality: N/A;  TRIGGER FINGER RELEASE Left 08/21/2019   Procedure: RELEASE TRIGGER LEFT SMALL FINGER LEFT INDEX;  Surgeon: Cindee Salt, MD;  Location: [facility];  Service: Orthopedics;  Laterality: Left;   ULNAR NERVE TRANSPOSITION Left 08/21/2019   Procedure: DECOMPRESSION WITH ULNAR NERVE LEFT CUBITAL TUNNEL ULNAR;  Surgeon: Cindee Salt, MD;  Location: [facility];  Service: Orthopedics;  Laterality: Left;      A IV Location/Drains/Wounds Patient Lines/Drains/Airways Status     Active Line/Drains/Airways     Name Placement date Placement time Site Days   Peripheral IV 02/19/23 18 G Anterior;Left Forearm 02/19/23  2049  Forearm  1            Intake/Output Last 24 hours  Intake/Output Summary (Last 24 hours) at 02/20/2023 1421 Last data filed at 02/20/2023 0415 Gross per 24 hour  Intake --  Output 1225 ml  Net -1225 ml    Labs/Imaging Results for orders placed or performed during the hospital encounter of 02/19/23 (from the past 48 hour(s))  CBC     Status: Abnormal   Collection Time: 02/19/23  2:34 PM  Result Value Ref Range   WBC 12.9 (H) 4.0 - 10.5 K/uL   RBC 4.30 4.22 - 5.81 MIL/uL   Hemoglobin 11.2 (L) 13.0 - 17.0 g/dL   HCT 04.5 (L) 40.9 - 81.1 %   MCV 84.9 80.0 - 100.0 fL   MCH 26.0 26.0 - 34.0 pg   MCHC 30.7 30.0 - 36.0 g/dL   RDW 91.4 (H) 78.2 - 95.6 %   Platelets 469 (H) 150 - 400 K/uL   nRBC 0.0 0.0 - 0.2 %    Comment: Performed at [facility], [address], [place], [place] [address]  Troponin I (High Sensitivity)     Status: Abnormal   Collection Time: 02/19/23  2:34 PM  Result Value Ref Range   Troponin I (High Sensitivity) 46 (H) <18 ng/L    Comment: (NOTE) Elevated high sensitivity troponin I (hsTnI) values and significant  changes across serial measurements may suggest ACS but many other  chronic and acute conditions are known to elevate hsTnI results.  Refer to the Links section for chest pain algorithms and additional  guidance. Performed at [facility], [address], [place], [place] [address]   Comprehensive metabolic panel     Status: Abnormal   Collection Time: 02/19/23  2:34 PM  Result Value Ref Range   Sodium 137 135 - 145 mmol/L   Potassium 3.9 3.5 - 5.1 mmol/L   Chloride 102 98 - 111 mmol/L   CO2 22 22 - 32 mmol/L   Glucose, Bld 280 (H) 70 - 99 mg/dL    Comment: Glucose reference range applies only to samples taken  after fasting for at least 8 hours.   BUN 44 (H) 8 - 23 mg/dL   Creatinine, Ser 6.96 (H) 0.61 - 1.24 mg/dL   Calcium 8.9 8.9 - 29.5 mg/dL   Total Protein 6.6 6.5 - 8.1 g/dL   Albumin 3.2 (L) 3.5 - 5.0 g/dL   AST 39 15 - 41 U/L   ALT 48 (H) 0 - 44 U/L   Alkaline Phosphatase 111 38 - 126 U/L   Total Bilirubin 0.6 0.3 - 1.2 mg/dL   GFR, Estimated 40 (L) >60 mL/min    Comment: (NOTE) Calculated using the CKD-EPI Creatinine Equation (2021)    Anion gap 13 5 - 15    Comment: Performed at [facility], [address], [place], [place] [address]  Brain natriuretic peptide     Status: Abnormal   Collection Time: 02/19/23  2:43 PM  Result Value Ref Range   B Natriuretic Peptide 331.6 (H) 0.0 - 100.0 pg/mL    Comment: Performed at [facility], [address] [place], [place] [address]  Troponin I (High Sensitivity)     Status: Abnormal   Collection Time: 02/19/23  4:44 PM  Result Value Ref Range   Troponin I (High Sensitivity) 44 (H) <18 ng/L    Comment: (NOTE) Elevated high sensitivity troponin I (hsTnI) values and significant  changes across serial measurements may suggest ACS but many other  chronic and acute conditions are known to elevate hsTnI results.  Refer to the "Links" section for chest pain algorithms and additional  guidance. Performed at [facility], [address], [place], [place] [address]   CBG monitoring, ED     Status: Abnormal   Collection Time: 02/19/23 10:10 PM  Result Value Ref Range   Glucose-Capillary 284 (H) 70 - 99 mg/dL    Comment: Glucose reference range applies only to samples taken after fasting for at least 8 hours.  Heparin level (unfractionated)     Status: Abnormal   Collection Time: 02/20/23  3:11 AM  Result Value Ref Range   Heparin Unfractionated 0.29 (L) 0.30 - 0.70 IU/mL    Comment: (NOTE) The clinical reportable range upper limit is being lowered to >1.10 to align with the FDA approved guidance for the current  laboratory assay.  If heparin results are below expected values, and patient dosage has  been confirmed, suggest follow up testing of antithrombin III levels. Performed at [facility], [address], [place], [place] [address]   CBC     Status: Abnormal   Collection Time: 02/20/23  3:11 AM  Result Value Ref Range   WBC 8.6 4.0 - 10.5 K/uL   RBC 4.46 4.22 - 5.81 MIL/uL   Hemoglobin 11.6 (L) 13.0 - 17.0 g/dL   HCT 29.5 (L) 62.1 - 30.8 %   MCV 82.3 80.0 - 100.0 fL   MCH 26.0 26.0 - 34.0 pg   MCHC 31.6 30.0 - 36.0 g/dL   RDW 65.7 (H) 84.6 - 96.2 %   Platelets 470 (H) 150 - 400 K/uL   nRBC 0.0 0.0 - 0.2 %    Comment: Performed at [facility], [address], [place], [place] [address]  Heparin level (unfractionated)     Status: Abnormal   Collection Time: 02/20/23 11:15 AM  Result Value Ref Range   Heparin Unfractionated 0.13 (L) 0.30 - 0.70 IU/mL    Comment: (NOTE) The clinical reportable range upper limit is being lowered to >1.10 to align with the FDA approved guidance for the current laboratory assay.  If heparin results are below expected values, and patient dosage has  been confirmed, suggest follow up testing of antithrombin III levels. Performed at [facility], [address], [place], [place] [address]   Basic metabolic panel     Status: Abnormal   Collection Time: 02/20/23 11:15 AM  Result Value Ref Range   Sodium 139 135 - 145 mmol/L   Potassium 4.4 3.5 - 5.1 mmol/L   Chloride 102 98 - 111 mmol/L   CO2 27 22 - 32 mmol/L   Glucose, Bld 320 (H) 70 - 99 mg/dL    Comment: Glucose reference range applies only to  samples taken after fasting for at least 8 hours.   BUN 35 (H) 8 - 23 mg/dL   Creatinine, Ser 0.98 (H) 0.61 - 1.24 mg/dL   Calcium 9.2 8.9 - 11.9 mg/dL   GFR, Estimated 54 (L) >60 mL/min    Comment: (NOTE) Calculated using the CKD-EPI Creatinine Equation (2021)    Anion gap 10 5 - 15    Comment: Performed at [facility],  [address] [place], [place] [address]   DG Chest 2 View  Result Date: 02/19/2023 CLINICAL DATA:  Preop exam. EXAM: CHEST - 2 VIEW COMPARISON:  January 01, 2021. FINDINGS: Mild peripheral interstitial prominence. No confluent consolidation. No visible pleural effusions or pneumothorax. Cardiomediastinal silhouette is unchanged. CABG and median sternotomy. Partially imaged left reverse shoulder arthroplasty. IMPRESSION: Mild peripheral interstitial prominence, suggestive of mild interstitial edema. No confluent consolidation. Electronically Signed   By: Feliberto Harts M.D.   On: 02/19/2023 14:33    Pending Labs Unresulted Labs (From admission, onward)     Start     Ordered   02/21/23 0500  Heparin level (unfractionated)  Daily,   R      02/19/23 2004   02/21/23 0500  Basic metabolic panel  Tomorrow morning,   R        02/20/23 0821   02/20/23 2200  Heparin level (unfractionated)  Once-Timed,   TIMED        02/20/23 1242   02/20/23 1245  Magnesium  Once,   R        02/20/23 1245   02/20/23 1245  TSH  Once,   R        02/20/23 1245   02/20/23 1029  Basic metabolic panel  Once,   R        02/20/23 1028   02/20/23 0500  CBC  Daily,   R      02/19/23 2004   Signed and Held  Basic metabolic panel  Daily,   R     Comments: As Scheduled for 5 days    Signed and Held   Signed and Held  Type and screen [facility]  Once,   R       Comments: [facility]    Signed and Held   Signed and Held  CBC  ONCE - STAT,   R        Signed and Held   Signed and Held  Basic metabolic panel  ONCE - STAT,   R        Signed and Held   Signed and Held  SARS CORONAVIRUS 2 (TAT 6-24 HRS) Anterior Nasal Swab  (Asymptomatic patient (provider preference for testing))  Once,   R        Signed and Held            Vitals/Pain Today's Vitals   02/20/23 0449 02/20/23 0530 02/20/23 1000 02/20/23 1230  BP:  (!) 158/97 (!) 136/90 138/84  Pulse:  69 66 68  Resp:  Temp: (!) 97.5 F (36.4 C)  98.8 F (37.1 C)   TempSrc: Oral  Oral   SpO2:  98% 96% 100%  Weight:      Height:      PainSc:        Isolation Precautions No active isolations  Medications Medications  aspirin EC tablet 81 mg (81 mg Oral Given 02/20/23 1046)  febuxostat (ULORIC) tablet 40 mg (has no administration  in time range)  ezetimibe (ZETIA) tablet 10 mg (has no administration in time range)  isosorbide mononitrate (IMDUR) 24 hr tablet 30 mg (30 mg Oral Given 02/20/23 1046)  nitroGLYCERIN (NITROSTAT) SL tablet 0.4 mg (has no administration in time range)  mirtazapine (REMERON) tablet 30 mg (30 mg Oral Given 02/20/23 0001)  docusate sodium (COLACE) capsule 100 mg (100 mg Oral Given 02/20/23 1045)  pantoprazole (PROTONIX) EC tablet 40 mg (40 mg Oral Given 02/20/23 1045)  polycarbophil (FIBERCON) tablet 625 mg (has no administration in time range)  clopidogrel (PLAVIX) tablet 75 mg (75 mg Oral Given 02/20/23 1046)  pregabalin (LYRICA) capsule 100 mg (100 mg Oral Given 02/20/23 1045)  albuterol (PROVENTIL) (2.5 MG/3ML) 0.083% nebulizer solution 3 mL (has no administration in time range)  heparin ADULT infusion 100 units/mL (25000 units/262mL) (2,100 Units/hr Intravenous Rate/Dose Change 02/20/23 1253)  sodium chloride flush (NS) 0.9 % injection 3 mL (has no administration in time range)  sodium chloride flush (NS) 0.9 % injection 3 mL (has no administration in time range)  0.9 %  sodium chloride infusion (has no administration in time range)  acetaminophen (TYLENOL) tablet 650 mg (has no administration in time range)  ondansetron (ZOFRAN) injection 4 mg (has no administration in time range)  insulin aspart (novoLOG) injection 0-15 Units (11 Units Subcutaneous Given 02/20/23 1046)  insulin aspart (novoLOG) injection 0-5 Units (3 Units Subcutaneous Given 02/19/23 2217)  predniSONE (DELTASONE) tablet 50 mg (has no administration in time range)  diphenhydrAMINE (BENADRYL) capsule 50 mg (has  no administration in time range)  dexmedetomidine (PRECEDEX) 400 MCG/100ML (4 mcg/mL) infusion (has no administration in time range)  norepinephrine (LEVOPHED) 4mg  in (0.016 mg/mL) premix infusion (has no administration in time range)  heparin 30,000 units/NS 1000 mL solution for CELLSAVER (has no administration in time range)  potassium chloride injection 80 mEq (has no administration in time range)  magnesium sulfate (IV Push/IM) injection 40 mEq (has no administration in time range)  vancomycin (VANCOREADY) IVPB 1500 mg/300 mL (has no administration in time range)  furosemide (LASIX) injection 60 mg (has no administration in time range)  furosemide (LASIX) injection 60 mg (60 mg Intravenous Given 02/19/23 2049)  heparin bolus via infusion 5,500 Units (5,500 Units Intravenous Bolus from Bag 02/19/23 2052)  heparin bolus via infusion 3,000 Units (3,000 Units Intravenous Bolus from Bag 02/20/23 1250)    Mobility walks     Focused Assessments Pulmonary Assessment Handoff:  Lung sounds: Bilateral Breath Sounds: Expiratory wheezes L Breath Sounds: Expiratory wheezes R Breath Sounds: Expiratory wheezes O2 Device: Room Air      R Recommendations: See Admitting Provider Note  Report given to:   Additional Notes:

## 2023-02-20 NOTE — Progress Notes (Signed)
ANTICOAGULATION CONSULT NOTE  Pharmacy Consult for Heparin Indication: atrial fibrillation  Allergies  Allergen Reactions   Cefuroxime Diarrhea and Nausea And Vomiting    Severe   Ibuprofen Anaphylaxis and Shortness Of Breath        Iodinated Contrast Media Shortness Of Breath and Other (See Comments)    Pt developed burning of face and SOB after 02/06/2023 CT scan    Cefpodoxime Rash   Ciprofloxacin Rash   Doxycycline Rash   Sulfamethoxazole-Trimethoprim Rash   Fluocinolone Other (See Comments)    Unknown reaction   Probenecid Other (See Comments)    Unknown reaction   Allopurinol Rash   Azithromycin Rash   Sulfa Antibiotics Rash    Patient Measurements: Height:  (188 cm) Weight: 102.7 kg (226 lb 6.6 oz) IBW/kg (Calculated) : 82.2 Heparin Dosing Weight: 103.1 kg  Vital Signs: Temp: 98.2 F (36.8 C) (04/16 1924) Temp Source: Oral (04/16 1924) BP: 143/80 (04/16 1924) Pulse Rate: 70 (04/16 1924)  Labs: Recent Labs    02/19/23 0823 02/19/23 1434 02/19/23 1644 02/20/23 0311 02/20/23 1115 02/20/23 1548 02/20/23 2033 02/20/23 2142  HGB 11.4* 11.2*  --  11.6*  --   --   --   --   HCT 38.6* 36.5*  --  36.7*  --   --   --   --   PLT 505* 469*  --  470*  --   --   --   --   APTT  --   --   --   --   --   --  76*  --   LABPROT 14.8  --   --   --   --   --   --   --   INR 1.2  --   --   --   --   --   --   --   HEPARINUNFRC  --   --   --  0.29* 0.13*  --   --  0.59  CREATININE 1.54* 1.75*  --   --  1.37* 1.57*  --   --   TROPONINIHS  --  46* 44*  --   --   --   --   --      Estimated Creatinine Clearance: 52 mL/min (A) (by C-G formula based on SCr of 1.57 mg/dL (H)).   Medical History: Past Medical History:  Diagnosis Date   Aortic stenosis    ARTHRITIS    ASTHMA    CAD (coronary artery disease)    CHF (congestive heart failure)    DM    GERD    HYPERLIPIDEMIA    Hypertension    PSORIASIS    SLEEP APNEA    SPINA BIFIDA     Medications:   Medications Prior to Admission  Medication Sig Dispense Refill Last Dose   albuterol (PROVENTIL HFA;VENTOLIN HFA) 108 (90 Base) MCG/ACT inhaler Inhale 2 puffs into the lungs every 4 (four) hours as needed for wheezing.   02/19/2023 at am   aspirin EC 81 MG EC tablet Take 1 tablet (81 mg total) by mouth daily. 90 tablet 3 02/19/2023 at am   atorvastatin (LIPITOR) 80 MG tablet Take 1 tablet (80 mg total) by mouth daily. 30 tablet 0 02/19/2023 at am   budesonide-formoterol (SYMBICORT) 160-4.5 MCG/ACT inhaler Inhale 2 puffs into the lungs in the morning and at bedtime.      clobetasol cream (TEMOVATE) 0.05 % Apply 1 application topically daily as needed (rash).  Past Week   clopidogrel (PLAVIX) 75 MG tablet Take 1 tablet (75 mg total) by mouth daily. 90 tablet 3 02/19/2023 at am   COSENTYX SENSOREADY, 300 MG, 150 MG/ML SOAJ Inject 300 mg into the skin every 30 (thirty) days.   Past Month   cyclobenzaprine (FLEXERIL) 10 MG tablet Take 10 mg by mouth at bedtime.    02/18/2023 at pm   desonide (DESOWEN) 0.05 % cream Apply 1 application topically daily as needed (rash).    Past Week   docusate sodium (COLACE) 100 MG capsule Take 100 mg by mouth daily.    02/18/2023 at pm   esomeprazole (NEXIUM) 40 MG capsule Take 40 mg by mouth daily before breakfast.   02/19/2023 at am   ezetimibe (ZETIA) 10 MG tablet Take 10 mg by mouth daily.   02/19/2023 at am   fluticasone (FLONASE) 50 MCG/ACT nasal spray Place 1 spray into both nostrils in the morning and at bedtime.      hydrocortisone (ANUCORT-HC) 25 MG suppository Place 1 suppository rectally 2 (two) times daily as needed for hemorrhoids.   Past Week   hydrOXYzine (VISTARIL) 25 MG capsule Take 25 mg by mouth daily as needed for itching (sleep).   Past Week   insulin lispro (HUMALOG KWIKPEN) 100 UNIT/ML KiwkPen Inject 18-24 Units into the skin 3 (three) times daily with meals.   02/19/2023 at am   isosorbide mononitrate (IMDUR) 30 MG 24 hr tablet Take 1 tablet (30 mg  total) by mouth daily. Please schedule appointment for refills. 30 tablet 0 02/19/2023 at am   JARDIANCE 10 MG TABS tablet Take 10 mg by mouth daily.   02/19/2023 at am   LANTUS SOLOSTAR 100 UNIT/ML Solostar Pen Inject 22 Units into the skin at bedtime.  5 02/18/2023 at pm   LYRICA 100 MG capsule Take 1 capsule (100 mg total) by mouth 2 (two) times daily.  5 02/19/2023 at am   metFORMIN (GLUCOPHAGE) 1000 MG tablet Take 1,000 mg by mouth 2 (two) times daily.   02/19/2023 at am   mirtazapine (REMERON) 30 MG tablet Take 1 tablet by mouth at bedtime.    02/18/2023 at pm   nitroGLYCERIN (NITROSTAT) 0.4 MG SL tablet Place 1 tablet (0.4 mg total) under the tongue every 5 (five) minutes as needed for chest pain. (Patient taking differently: Place 0.4 mg under the tongue daily as needed (for difficulty breathing d/t fluid in lungs).) 90 tablet 3 Past Week   nystatin cream (MYCOSTATIN) Apply 1 Application topically 2 (two) times daily.   02/19/2023 at am   nystatin-triamcinolone (MYCOLOG II) cream Apply 1 application topically daily as needed (rash).    02/18/2023 at pm   oxyCODONE-acetaminophen (PERCOCET) 10-325 MG per tablet Take 1-2 tablets by mouth 2 (two) times daily as needed for pain.    02/19/2023 at am   polycarbophil (FIBERCON) 625 MG tablet Take 625 mg by mouth daily.   02/18/2023 at pm   SOOLANTRA 1 % CREA Apply 1 application  topically daily.  3 02/19/2023   tamsulosin (FLOMAX) 0.4 MG CAPS capsule Take 0.4 capsules by mouth daily.   02/18/2023 at pm   tiZANidine (ZANAFLEX) 4 MG tablet Take 4 mg by mouth at bedtime.   02/18/2023 at pm   torsemide (DEMADEX) 20 MG tablet Take 1 tablet (20 mg total) by mouth daily. (Take 2 tablets in case of weight gain of 2 to 3 lbs in 24 hrs or 5 lbs in 7 days. 30 tablet 0  02/19/2023 at am   triamcinolone cream (KENALOG) 0.1 % Apply 1 Application topically 2 (two) times daily as needed (for itching).   02/19/2023 at am   ULORIC 40 MG tablet Take 1 tablet by mouth Daily.    02/18/2023 at pm   VIIBRYD 40 MG TABS Take 40 mg by mouth Daily.    02/19/2023 at am   predniSONE (DELTASONE) 50 MG tablet Take as directed prior to 4/17 surgery (Patient not taking: Reported on 02/19/2023) 2 tablet 0 Not Taking    Scheduled:   [START ON 02/21/2023] acetaminophen  1,000 mg Oral Once   aspirin EC  81 mg Oral Daily   chlorhexidine  30 mL Topical UD   [START ON 02/21/2023] chlorhexidine  60 mL Topical Once   [START ON 02/21/2023] chlorhexidine  15 mL Mouth/Throat Once   [COMPLETED] chlorhexidine  15 mL Mouth/Throat Once   clopidogrel  75 mg Oral Daily   [START ON 02/21/2023] diphenhydrAMINE  50 mg Oral On Call   [START ON 02/21/2023] diphenhydrAMINE  50 mg Oral On Call to OR   docusate sodium  100 mg Oral Daily   ezetimibe  10 mg Oral Daily   febuxostat  40 mg Oral Daily   insulin aspart  0-15 Units Subcutaneous TID WC   insulin aspart  0-5 Units Subcutaneous QHS   isosorbide mononitrate  30 mg Oral Daily   [START ON 02/21/2023] magnesium sulfate  40 mEq Other To OR   mirtazapine  30 mg Oral QHS   pantoprazole  40 mg Oral Daily   polycarbophil  625 mg Oral Daily   [START ON 02/21/2023] potassium chloride  80 mEq Other To OR   predniSONE  50 mg Oral Q6H   pregabalin  100 mg Oral BID   sodium chloride flush  3 mL Intravenous Q12H   Infusions:   sodium chloride     [START ON 02/21/2023] sodium chloride     [START ON 02/21/2023]  ceFAZolin (ANCEF) IV     [START ON 02/21/2023] dexmedetomidine     [START ON 02/21/2023] heparin 30,000 units/NS 1000 mL solution for CELLSAVER     heparin 2,100 Units/hr (02/20/23 1801)   [START ON 02/21/2023] norepinephrine (LEVOPHED) Adult infusion     PRN: sodium chloride, acetaminophen, albuterol, hydrOXYzine, nitroGLYCERIN, ondansetron (ZOFRAN) IV, sodium chloride flush  Assessment: 77 yom with a history of 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 Cardiology was planning for TAVR on 02/21/23. Patient  is presenting with SOB / AF. Heparin per pharmacy consult placed for atrial fibrillation.  Patient is not on anticoagulation prior to arrival. Repeat heparin level is therapeutic at 0.59  Goal of Therapy:  Heparin level 0.3-0.7 units/ml Monitor platelets by anticoagulation protocol: Yes   Plan:  Continue heparin at 2100 units/h Daily HL, CBC F/u s/sx bleeding   Calton Dach, PharmD Clinical Pharmacist 02/20/2023 10:19 PM

## 2023-02-21 ENCOUNTER — Inpatient Hospital Stay (HOSPITAL_COMMUNITY)
Admission: RE | Admit: 2023-02-21 | Payer: BC Managed Care – PPO | Source: Home / Self Care | Admitting: Cardiovascular Disease

## 2023-02-21 ENCOUNTER — Inpatient Hospital Stay (HOSPITAL_COMMUNITY): Payer: BC Managed Care – PPO

## 2023-02-21 ENCOUNTER — Inpatient Hospital Stay (HOSPITAL_COMMUNITY): Payer: BC Managed Care – PPO | Admitting: Certified Registered Nurse Anesthetist

## 2023-02-21 ENCOUNTER — Encounter (HOSPITAL_COMMUNITY): Payer: Self-pay | Admitting: Cardiology

## 2023-02-21 ENCOUNTER — Other Ambulatory Visit: Payer: Self-pay | Admitting: Cardiology

## 2023-02-21 ENCOUNTER — Inpatient Hospital Stay (HOSPITAL_COMMUNITY): Payer: BC Managed Care – PPO | Admitting: Physician Assistant

## 2023-02-21 ENCOUNTER — Inpatient Hospital Stay (HOSPITAL_COMMUNITY)
Admission: EM | Disposition: A | Discharge status: Home / Self Care | Payer: Self-pay | Source: Home / Self Care | Attending: Cardiovascular Disease

## 2023-02-21 ENCOUNTER — Other Ambulatory Visit (HOSPITAL_COMMUNITY): Payer: Self-pay

## 2023-02-21 DIAGNOSIS — I480 Paroxysmal atrial fibrillation: Secondary | ICD-10-CM | POA: Insufficient documentation

## 2023-02-21 DIAGNOSIS — E119 Type 2 diabetes mellitus without complications: Secondary | ICD-10-CM

## 2023-02-21 DIAGNOSIS — I10 Essential (primary) hypertension: Secondary | ICD-10-CM

## 2023-02-21 DIAGNOSIS — Z7984 Long term (current) use of oral hypoglycemic drugs: Secondary | ICD-10-CM

## 2023-02-21 DIAGNOSIS — Z952 Presence of prosthetic heart valve: Secondary | ICD-10-CM | POA: Diagnosis not present

## 2023-02-21 DIAGNOSIS — Z794 Long term (current) use of insulin: Secondary | ICD-10-CM

## 2023-02-21 DIAGNOSIS — I440 Atrioventricular block, first degree: Secondary | ICD-10-CM | POA: Insufficient documentation

## 2023-02-21 DIAGNOSIS — Z006 Encounter for examination for normal comparison and control in clinical research program: Secondary | ICD-10-CM

## 2023-02-21 DIAGNOSIS — I2510 Atherosclerotic heart disease of native coronary artery without angina pectoris: Secondary | ICD-10-CM

## 2023-02-21 DIAGNOSIS — I350 Nonrheumatic aortic (valve) stenosis: Secondary | ICD-10-CM

## 2023-02-21 DIAGNOSIS — Z951 Presence of aortocoronary bypass graft: Secondary | ICD-10-CM

## 2023-02-21 HISTORY — PX: INTRAOPERATIVE TRANSTHORACIC ECHOCARDIOGRAM: SHX6523

## 2023-02-21 HISTORY — DX: Presence of prosthetic heart valve: Z95.2

## 2023-02-21 HISTORY — PX: TRANSCATHETER AORTIC VALVE REPLACEMENT, TRANSFEMORAL: SHX6400

## 2023-02-21 LAB — BASIC METABOLIC PANEL
Anion gap: 10 (ref 5–15)
BUN: 40 mg/dL — ABNORMAL HIGH (ref 8–23)
CO2: 21 mmol/L — ABNORMAL LOW (ref 22–32)
Calcium: 9.2 mg/dL (ref 8.9–10.3)
Chloride: 101 mmol/L (ref 98–111)
Creatinine, Ser: 1.59 mg/dL — ABNORMAL HIGH (ref 0.61–1.24)
GFR, Estimated: 45 mL/min — ABNORMAL LOW (ref >60)
Glucose, Bld: 317 mg/dL — ABNORMAL HIGH (ref 70–99)
Potassium: 4 mmol/L (ref 3.5–5.1)
Sodium: 132 mmol/L — ABNORMAL LOW (ref 135–145)

## 2023-02-21 LAB — CBC
HCT: 39.9 % (ref 39.0–52.0)
Hemoglobin: 12.1 g/dL — ABNORMAL LOW (ref 13.0–17.0)
MCH: 25.1 pg — ABNORMAL LOW (ref 26.0–34.0)
MCHC: 30.3 g/dL (ref 30.0–36.0)
MCV: 82.8 fL (ref 80.0–100.0)
Platelets: 513 10*3/uL — ABNORMAL HIGH (ref 150–400)
RBC: 4.82 MIL/uL (ref 4.22–5.81)
RDW: 19.9 % — ABNORMAL HIGH (ref 11.5–15.5)
WBC: 13.2 10*3/uL — ABNORMAL HIGH (ref 4.0–10.5)
nRBC: 0 % (ref 0.0–0.2)

## 2023-02-21 LAB — POCT I-STAT 7, (LYTES, BLD GAS, ICA,H+H)
Acid-base deficit: 2 mmol/L (ref 0.0–2.0)
Acid-base deficit: 1 mmol/L (ref 0.0–2.0)
Bicarbonate: 21.8 mmol/L (ref 20.0–28.0)
Bicarbonate: 25.7 mmol/L (ref 20.0–28.0)
Calcium, Ion: 1.3 mmol/L (ref 1.15–1.40)
Calcium, Ion: 1.32 mmol/L (ref 1.15–1.40)
HCT: 38 % — ABNORMAL LOW (ref 39.0–52.0)
HCT: 35 % — ABNORMAL LOW (ref 39.0–52.0)
Hemoglobin: 12.9 g/dL — ABNORMAL LOW (ref 13.0–17.0)
Hemoglobin: 11.9 g/dL — ABNORMAL LOW (ref 13.0–17.0)
O2 Saturation: 99 %
O2 Saturation: 99 %
Patient temperature: 97.7
Potassium: 4.1 mmol/L (ref 3.5–5.1)
Potassium: 4.2 mmol/L (ref 3.5–5.1)
Sodium: 140 mmol/L (ref 135–145)
Sodium: 138 mmol/L (ref 135–145)
TCO2: 23 mmol/L (ref 22–32)
TCO2: 27 mmol/L (ref 22–32)
pCO2 arterial: 32 mmHg (ref 32–48)
pCO2 arterial: 51 mmHg — ABNORMAL HIGH (ref 32–48)
pH, Arterial: 7.44 (ref 7.35–7.45)
pH, Arterial: 7.307 — ABNORMAL LOW (ref 7.35–7.45)
pO2, Arterial: 149 mmHg — ABNORMAL HIGH (ref 83–108)
pO2, Arterial: 139 mmHg — ABNORMAL HIGH (ref 83–108)

## 2023-02-21 LAB — POCT I-STAT, CHEM 8
BUN: 36 mg/dL — ABNORMAL HIGH (ref 8–23)
BUN: 37 mg/dL — ABNORMAL HIGH (ref 8–23)
Calcium, Ion: 1.31 mmol/L (ref 1.15–1.40)
Calcium, Ion: 1.34 mmol/L (ref 1.15–1.40)
Chloride: 104 mmol/L (ref 98–111)
Chloride: 103 mmol/L (ref 98–111)
Creatinine, Ser: 1.1 mg/dL (ref 0.61–1.24)
Creatinine, Ser: 1.2 mg/dL (ref 0.61–1.24)
Glucose, Bld: 272 mg/dL — ABNORMAL HIGH (ref 70–99)
Glucose, Bld: 286 mg/dL — ABNORMAL HIGH (ref 70–99)
HCT: 39 % (ref 39.0–52.0)
HCT: 38 % — ABNORMAL LOW (ref 39.0–52.0)
Hemoglobin: 13.3 g/dL (ref 13.0–17.0)
Hemoglobin: 12.9 g/dL — ABNORMAL LOW (ref 13.0–17.0)
Potassium: 4.1 mmol/L (ref 3.5–5.1)
Potassium: 4.2 mmol/L (ref 3.5–5.1)
Sodium: 140 mmol/L (ref 135–145)
Sodium: 139 mmol/L (ref 135–145)
TCO2: 24 mmol/L (ref 22–32)
TCO2: 26 mmol/L (ref 22–32)

## 2023-02-21 LAB — GLUCOSE, CAPILLARY
Glucose-Capillary: 361 mg/dL — ABNORMAL HIGH (ref 70–99)
Glucose-Capillary: 265 mg/dL — ABNORMAL HIGH (ref 70–99)
Glucose-Capillary: 254 mg/dL — ABNORMAL HIGH (ref 70–99)
Glucose-Capillary: 352 mg/dL — ABNORMAL HIGH (ref 70–99)

## 2023-02-21 LAB — ECHOCARDIOGRAM LIMITED
AR max vel: 3.46 cm2
AV Area VTI: 3.88 cm2
AV Area mean vel: 3.64 cm2
AV Mean grad: 3 mmHg
AV Peak grad: 5.4 mmHg
Ao pk vel: 1.16 m/s

## 2023-02-21 LAB — HEPARIN LEVEL (UNFRACTIONATED): Heparin Unfractionated: 0.83 IU/mL — ABNORMAL HIGH (ref 0.30–0.70)

## 2023-02-21 SURGERY — IMPLANTATION, AORTIC VALVE, TRANSCATHETER, FEMORAL APPROACH
Anesthesia: Monitor Anesthesia Care

## 2023-02-21 MED ORDER — PROMETHAZINE HCL 25 MG/ML IJ SOLN: 6.2500 mg | INTRAMUSCULAR | Status: DC | PRN

## 2023-02-21 MED ORDER — FENTANYL CITRATE (PF) 100 MCG/2ML IJ SOLN
INTRAMUSCULAR | Status: AC
  Filled 2023-02-21: qty 2

## 2023-02-21 MED ORDER — ONDANSETRON HCL 4 MG/2ML IJ SOLN: 4.0000 mg | Freq: Four times a day (QID) | INTRAMUSCULAR | Status: DC | PRN

## 2023-02-21 MED ORDER — ETOMIDATE 2 MG/ML IV SOLN
INTRAVENOUS | Status: DC | PRN
  Administered 2023-02-21: 20 mg via INTRAVENOUS

## 2023-02-21 MED ORDER — SODIUM CHLORIDE 0.9% FLUSH: 3.0000 mL | INTRAVENOUS | Status: DC | PRN

## 2023-02-21 MED ORDER — CHLORHEXIDINE GLUCONATE CLOTH 2 % EX PADS
6.0000 | MEDICATED_PAD | Freq: Every day | CUTANEOUS | Status: DC
  Administered 2023-02-21 – 2023-02-22 (×2): 6 via TOPICAL

## 2023-02-21 MED ORDER — MEPERIDINE HCL 25 MG/ML IJ SOLN: 6.2500 mg | INTRAMUSCULAR | Status: DC | PRN

## 2023-02-21 MED ORDER — PROPOFOL 500 MG/50ML IV EMUL
INTRAVENOUS | Status: DC | PRN
  Administered 2023-02-21: 20 ug/kg/min via INTRAVENOUS
  Administered 2023-02-21: 10 mg via INTRAVENOUS

## 2023-02-21 MED ORDER — CLEVIDIPINE BUTYRATE 0.5 MG/ML IV EMUL
INTRAVENOUS | Status: DC | PRN
  Administered 2023-02-21: 4 mg/h via INTRAVENOUS

## 2023-02-21 MED ORDER — OXYCODONE HCL 5 MG PO TABS: 5.0000 mg | ORAL_TABLET | Freq: Once | ORAL | Status: DC | PRN

## 2023-02-21 MED ORDER — FUROSEMIDE 10 MG/ML IJ SOLN
40.0000 mg | Freq: Once | INTRAMUSCULAR | Status: AC
  Administered 2023-02-21: 40 mg via INTRAVENOUS
  Filled 2023-02-21: qty 4

## 2023-02-21 MED ORDER — MIDAZOLAM HCL 5 MG/5ML IJ SOLN
INTRAMUSCULAR | Status: DC | PRN
  Administered 2023-02-21: 1 mg via INTRAVENOUS

## 2023-02-21 MED ORDER — SODIUM CHLORIDE 0.9 % IV SOLN: 250.0000 mL | INTRAVENOUS | Status: DC | PRN

## 2023-02-21 MED ORDER — MORPHINE SULFATE (PF) 2 MG/ML IV SOLN
1.0000 mg | INTRAVENOUS | Status: DC | PRN
  Administered 2023-02-21: 2 mg via INTRAVENOUS
  Filled 2023-02-21: qty 1

## 2023-02-21 MED ORDER — MIDAZOLAM HCL 2 MG/2ML IJ SOLN
INTRAMUSCULAR | Status: AC
  Filled 2023-02-21: qty 2

## 2023-02-21 MED ORDER — IODIXANOL 320 MG/ML IV SOLN
INTRAVENOUS | Status: DC | PRN
  Administered 2023-02-21: 40 mL

## 2023-02-21 MED ORDER — OXYCODONE HCL 5 MG/5ML PO SOLN: 5.0000 mg | Freq: Once | ORAL | Status: DC | PRN

## 2023-02-21 MED ORDER — HEPARIN (PORCINE) IN NACL 1000-0.9 UT/500ML-% IV SOLN
INTRAVENOUS | Status: DC | PRN
  Administered 2023-02-21: 500 mL

## 2023-02-21 MED ORDER — NITROGLYCERIN IN D5W 200-5 MCG/ML-% IV SOLN
0.0000 ug/min | INTRAVENOUS | Status: DC
  Filled 2023-02-21: qty 250

## 2023-02-21 MED ORDER — HEPARIN (PORCINE) IN NACL 1000-0.9 UT/500ML-% IV SOLN
INTRAVENOUS | Status: DC | PRN
  Administered 2023-02-21 (×2): 500 mL

## 2023-02-21 MED ORDER — SODIUM CHLORIDE 0.9 % IV SOLN: INTRAVENOUS | Status: DC

## 2023-02-21 MED ORDER — APIXABAN 5 MG PO TABS
5.0000 mg | ORAL_TABLET | Freq: Two times a day (BID) | ORAL | Status: DC
  Administered 2023-02-21 – 2023-02-22 (×2): 5 mg via ORAL
  Filled 2023-02-21 (×2): qty 1

## 2023-02-21 MED ORDER — VANCOMYCIN HCL IN DEXTROSE 1-5 GM/200ML-% IV SOLN
1000.0000 mg | Freq: Once | INTRAVENOUS | Status: AC
  Administered 2023-02-21: 1000 mg via INTRAVENOUS
  Filled 2023-02-21: qty 200

## 2023-02-21 MED ORDER — HEPARIN SODIUM (PORCINE) 1000 UNIT/ML IJ SOLN
INTRAMUSCULAR | Status: DC | PRN
  Administered 2023-02-21: 20000 [IU] via INTRAVENOUS

## 2023-02-21 MED ORDER — LACTATED RINGERS IV SOLN: INTRAVENOUS | Status: DC | PRN

## 2023-02-21 MED ORDER — ROCURONIUM BROMIDE 10 MG/ML (PF) SYRINGE
PREFILLED_SYRINGE | INTRAVENOUS | Status: DC | PRN
  Administered 2023-02-21: 70 mg via INTRAVENOUS
  Administered 2023-02-21: 10 mg via INTRAVENOUS

## 2023-02-21 MED ORDER — LIDOCAINE HCL (PF) 1 % IJ SOLN
INTRAMUSCULAR | Status: AC
  Filled 2023-02-21: qty 30

## 2023-02-21 MED ORDER — SUGAMMADEX SODIUM 200 MG/2ML IV SOLN
INTRAVENOUS | Status: DC | PRN
  Administered 2023-02-21: 100 mg via INTRAVENOUS
  Administered 2023-02-21: 50 mg via INTRAVENOUS
  Administered 2023-02-21: 100 mg via INTRAVENOUS

## 2023-02-21 MED ORDER — OXYCODONE HCL 5 MG PO TABS
5.0000 mg | ORAL_TABLET | ORAL | Status: DC | PRN
  Administered 2023-02-21 (×2): 10 mg via ORAL
  Administered 2023-02-21: 5 mg via ORAL
  Administered 2023-02-22 (×2): 10 mg via ORAL
  Filled 2023-02-21 (×3): qty 2
  Filled 2023-02-21: qty 1
  Filled 2023-02-21: qty 2

## 2023-02-21 MED ORDER — ACETAMINOPHEN 325 MG PO TABS: 650.0000 mg | ORAL_TABLET | Freq: Four times a day (QID) | ORAL | Status: DC | PRN

## 2023-02-21 MED ORDER — ONDANSETRON HCL 4 MG/2ML IJ SOLN
INTRAMUSCULAR | Status: DC | PRN
  Administered 2023-02-21: 4 mg via INTRAVENOUS

## 2023-02-21 MED ORDER — FENTANYL CITRATE (PF) 100 MCG/2ML IJ SOLN
INTRAMUSCULAR | Status: DC | PRN
  Administered 2023-02-21: 50 ug via INTRAVENOUS

## 2023-02-21 MED ORDER — SODIUM CHLORIDE 0.9 % IV SOLN: INTRAVENOUS | Status: AC

## 2023-02-21 MED ORDER — FENTANYL CITRATE (PF) 100 MCG/2ML IJ SOLN: 25.0000 ug | INTRAMUSCULAR | Status: DC | PRN

## 2023-02-21 MED ORDER — ACETAMINOPHEN 650 MG RE SUPP: 650.0000 mg | Freq: Four times a day (QID) | RECTAL | Status: DC | PRN

## 2023-02-21 MED ORDER — SODIUM CHLORIDE 0.9% FLUSH
3.0000 mL | Freq: Two times a day (BID) | INTRAVENOUS | Status: DC
  Administered 2023-02-21 – 2023-02-22 (×2): 3 mL via INTRAVENOUS

## 2023-02-21 MED ORDER — SODIUM CHLORIDE 0.9 % IV SOLN
250.0000 mL | INTRAVENOUS | Status: DC
  Administered 2023-02-21: 250 mL via INTRAVENOUS

## 2023-02-21 MED ORDER — PROTAMINE SULFATE 10 MG/ML IV SOLN
INTRAVENOUS | Status: DC | PRN
  Administered 2023-02-21: 180 mg via INTRAVENOUS
  Administered 2023-02-21: 20 mg via INTRAVENOUS

## 2023-02-21 MED ORDER — MIDAZOLAM HCL 2 MG/2ML IJ SOLN: 0.5000 mg | Freq: Once | INTRAMUSCULAR | Status: DC | PRN

## 2023-02-21 MED ORDER — TRAMADOL HCL 50 MG PO TABS
50.0000 mg | ORAL_TABLET | ORAL | Status: DC | PRN
  Administered 2023-02-21: 100 mg via ORAL
  Filled 2023-02-21: qty 2

## 2023-02-21 SURGICAL SUPPLY — 32 items
BAG SNAP BAND KOVER 36X36 (MISCELLANEOUS) ×4 IMPLANT
CABLE ADAPT PACING TEMP 12FT (ADAPTER) IMPLANT
CATH COMMANDER DELIVERY SYS 29 (CATHETERS) IMPLANT
CATH DIAG 6FR PIGTAIL ANGLED (CATHETERS) IMPLANT
CATH INFINITI 5F PIG 125CM (CATHETERS) IMPLANT
CATH INFINITI 5FR JR4 125CM (CATHETERS) IMPLANT
CATH INFINITI 6F AL1 (CATHETERS) IMPLANT
CATH INFINITI 6F AL2 (CATHETERS) IMPLANT
CATH INFINITI JR4 5F (CATHETERS) IMPLANT
CLOSURE MYNX CONTROL 6F/7F (Vascular Products) IMPLANT
CLOSURE PERCLOSE PROSTYLE (VASCULAR PRODUCTS) IMPLANT
CRIMPER (MISCELLANEOUS) IMPLANT
DEVICE INFLATION ATRION QL38 (MISCELLANEOUS) IMPLANT
ELECT DEFIB PAD ADLT CADENCE (PAD) IMPLANT
KIT HEART LEFT (KITS) ×2 IMPLANT
KIT MICROPUNCTURE NIT STIFF (SHEATH) IMPLANT
KIT SAPIAN 3 ULTRA RESILIA 29 (Valve) IMPLANT
MAT PREVALON FULL STRYKER (MISCELLANEOUS) IMPLANT
PACK CARDIAC CATHETERIZATION (CUSTOM PROCEDURE TRAY) ×2 IMPLANT
SHEATH INTRODUCER SET 29 (SHEATH) IMPLANT
SHEATH PINNACLE 6F 10CM (SHEATH) IMPLANT
SHEATH PINNACLE 8F 10CM (SHEATH) IMPLANT
SLEEVE REPOSITIONING LENGTH 30 (MISCELLANEOUS) IMPLANT
STOPCOCK MORSE 400PSI 3WAY (MISCELLANEOUS) ×4 IMPLANT
SYR MEDRAD MARK 7 150ML (SYRINGE) IMPLANT
TRANSDUCER W/STOPCOCK (MISCELLANEOUS) ×4 IMPLANT
WIRE AMPLATZ SS-J .035X180CM (WIRE) IMPLANT
WIRE EMERALD 3MM-J .035X150CM (WIRE) IMPLANT
WIRE EMERALD 3MM-J .035X260CM (WIRE) IMPLANT
WIRE EMERALD ST .035X260CM (WIRE) IMPLANT
WIRE PACING TEMP ST TIP 5 (CATHETERS) IMPLANT
WIRE SAFARI SM CURVE 275 (WIRE) IMPLANT

## 2023-02-21 NOTE — Progress Notes (Signed)
ANTICOAGULATION CONSULT NOTE  Pharmacy Consult for Heparin Indication: atrial fibrillation  Allergies  Allergen Reactions   Cefuroxime Diarrhea and Nausea And Vomiting    Severe   Ibuprofen Anaphylaxis and Shortness Of Breath        Iodinated Contrast Media Shortness Of Breath and Other (See Comments)    Pt developed burning of face and SOB after 02/06/2023 CT scan    Cefpodoxime Rash   Ciprofloxacin Rash   Doxycycline Rash   Sulfamethoxazole-Trimethoprim Rash   Fluocinolone Other (See Comments)    Unknown reaction   Probenecid Other (See Comments)    Unknown reaction   Allopurinol Rash   Azithromycin Rash   Sulfa Antibiotics Rash    Patient Measurements: Height: 6\' 2"  (188 cm) Weight: 102.3 kg (225 lb 9.6 oz) IBW/kg (Calculated) : 82.2 Heparin Dosing Weight: 103.1 kg  Vital Signs: Temp: 97.9 F (36.6 C) (04/17 0433) Temp Source: Oral (04/17 0433) BP: 148/79 (04/17 0727) Pulse Rate: 68 (04/17 0727)  Labs: Recent Labs    02/19/23 0823 02/19/23 1434 02/19/23 1434 02/19/23 1644 02/20/23 0311 02/20/23 1115 02/20/23 1548 02/20/23 2033 02/20/23 2142 02/21/23 0113  HGB 11.4* 11.2*  --   --  11.6*  --   --   --   --  12.1*  HCT 38.6* 36.5*  --   --  36.7*  --   --   --   --  39.9  PLT 505* 469*  --   --  470*  --   --   --   --  513*  APTT  --   --   --   --   --   --   --  76*  --   --   LABPROT 14.8  --   --   --   --   --   --   --   --   --   INR 1.2  --   --   --   --   --   --   --   --   --   HEPARINUNFRC  --   --    < >  --  0.29* 0.13*  --   --  0.59 0.83*  CREATININE 1.54* 1.75*  --   --   --  1.37* 1.57*  --   --  1.59*  TROPONINIHS  --  46*  --  44*  --   --   --   --   --   --    < > = values in this interval not displayed.     Estimated Creatinine Clearance: 51.2 mL/min (A) (by C-G formula based on SCr of 1.59 mg/dL (H)).   Medical History: Past Medical History:  Diagnosis Date   Aortic stenosis    ARTHRITIS    ASTHMA    CAD (coronary  artery disease)    CHF (congestive heart failure)    DM    GERD    HYPERLIPIDEMIA    Hypertension    PSORIASIS    SLEEP APNEA    SPINA BIFIDA     Medications:  Medications Prior to Admission  Medication Sig Dispense Refill Last Dose   albuterol (PROVENTIL HFA;VENTOLIN HFA) 108 (90 Base) MCG/ACT inhaler Inhale 2 puffs into the lungs every 4 (four) hours as needed for wheezing.   02/19/2023 at am   aspirin EC 81 MG EC tablet Take 1 tablet (81 mg total) by mouth daily. 90 tablet 3 02/19/2023 at  am   atorvastatin (LIPITOR) 80 MG tablet Take 1 tablet (80 mg total) by mouth daily. 30 tablet 0 02/19/2023 at am   budesonide-formoterol (SYMBICORT) 160-4.5 MCG/ACT inhaler Inhale 2 puffs into the lungs in the morning and at bedtime.      clobetasol cream (TEMOVATE) 0.05 % Apply 1 application topically daily as needed (rash).    Past Week   clopidogrel (PLAVIX) 75 MG tablet Take 1 tablet (75 mg total) by mouth daily. 90 tablet 3 02/19/2023 at am   COSENTYX SENSOREADY, 300 MG, 150 MG/ML SOAJ Inject 300 mg into the skin every 30 (thirty) days.   Past Month   cyclobenzaprine (FLEXERIL) 10 MG tablet Take 10 mg by mouth at bedtime.    02/18/2023 at pm   desonide (DESOWEN) 0.05 % cream Apply 1 application topically daily as needed (rash).    Past Week   docusate sodium (COLACE) 100 MG capsule Take 100 mg by mouth daily.    02/18/2023 at pm   esomeprazole (NEXIUM) 40 MG capsule Take 40 mg by mouth daily before breakfast.   02/19/2023 at am   ezetimibe (ZETIA) 10 MG tablet Take 10 mg by mouth daily.   02/19/2023 at am   fluticasone (FLONASE) 50 MCG/ACT nasal spray Place 1 spray into both nostrils in the morning and at bedtime.      hydrocortisone (ANUCORT-HC) 25 MG suppository Place 1 suppository rectally 2 (two) times daily as needed for hemorrhoids.   Past Week   hydrOXYzine (VISTARIL) 25 MG capsule Take 25 mg by mouth daily as needed for itching (sleep).   Past Week   insulin lispro (HUMALOG KWIKPEN) 100 UNIT/ML  KiwkPen Inject 18-24 Units into the skin 3 (three) times daily with meals.   02/19/2023 at am   isosorbide mononitrate (IMDUR) 30 MG 24 hr tablet Take 1 tablet (30 mg total) by mouth daily. Please schedule appointment for refills. 30 tablet 0 02/19/2023 at am   JARDIANCE 10 MG TABS tablet Take 10 mg by mouth daily.   02/19/2023 at am   LANTUS SOLOSTAR 100 UNIT/ML Solostar Pen Inject 22 Units into the skin at bedtime.  5 02/18/2023 at pm   LYRICA 100 MG capsule Take 1 capsule (100 mg total) by mouth 2 (two) times daily.  5 02/19/2023 at am   metFORMIN (GLUCOPHAGE) 1000 MG tablet Take 1,000 mg by mouth 2 (two) times daily.   02/19/2023 at am   mirtazapine (REMERON) 30 MG tablet Take 1 tablet by mouth at bedtime.    02/18/2023 at pm   nitroGLYCERIN (NITROSTAT) 0.4 MG SL tablet Place 1 tablet (0.4 mg total) under the tongue every 5 (five) minutes as needed for chest pain. (Patient taking differently: Place 0.4 mg under the tongue daily as needed (for difficulty breathing d/t fluid in lungs).) 90 tablet 3 Past Week   nystatin cream (MYCOSTATIN) Apply 1 Application topically 2 (two) times daily.   02/19/2023 at am   nystatin-triamcinolone (MYCOLOG II) cream Apply 1 application topically daily as needed (rash).    02/18/2023 at pm   oxyCODONE-acetaminophen (PERCOCET) 10-325 MG per tablet Take 1-2 tablets by mouth 2 (two) times daily as needed for pain.    02/19/2023 at am   polycarbophil (FIBERCON) 625 MG tablet Take 625 mg by mouth daily.   02/18/2023 at pm   SOOLANTRA 1 % CREA Apply 1 application  topically daily.  3 02/19/2023   tamsulosin (FLOMAX) 0.4 MG CAPS capsule Take 0.4 capsules by mouth daily.  02/18/2023 at pm   tiZANidine (ZANAFLEX) 4 MG tablet Take 4 mg by mouth at bedtime.   02/18/2023 at pm   torsemide (DEMADEX) 20 MG tablet Take 1 tablet (20 mg total) by mouth daily. (Take 2 tablets in case of weight gain of 2 to 3 lbs in 24 hrs or 5 lbs in 7 days. 30 tablet 0 02/19/2023 at am   triamcinolone cream  (KENALOG) 0.1 % Apply 1 Application topically 2 (two) times daily as needed (for itching).   02/19/2023 at am   ULORIC 40 MG tablet Take 1 tablet by mouth Daily.   02/18/2023 at pm   VIIBRYD 40 MG TABS Take 40 mg by mouth Daily.    02/19/2023 at am   predniSONE (DELTASONE) 50 MG tablet Take as directed prior to 4/17 surgery (Patient not taking: Reported on 02/19/2023) 2 tablet 0 Not Taking    Scheduled:   [MAR Hold] aspirin EC  81 mg Oral Daily   chlorhexidine  30 mL Topical UD   [MAR Hold] clopidogrel  75 mg Oral Daily   [MAR Hold] docusate sodium  100 mg Oral Daily   [MAR Hold] ezetimibe  10 mg Oral Daily   [MAR Hold] febuxostat  40 mg Oral Daily   furosemide  40 mg Intravenous Once   [MAR Hold] insulin aspart  0-15 Units Subcutaneous TID WC   [MAR Hold] insulin aspart  0-5 Units Subcutaneous QHS   [MAR Hold] isosorbide mononitrate  30 mg Oral Daily   magnesium sulfate  40 mEq Other To OR   [MAR Hold] mirtazapine  30 mg Oral QHS   [MAR Hold] pantoprazole  40 mg Oral Daily   [MAR Hold] polycarbophil  625 mg Oral Daily   potassium chloride  80 mEq Other To OR   [MAR Hold] pregabalin  100 mg Oral BID   [MAR Hold] sodium chloride flush  3 mL Intravenous Q12H   Infusions:   [MAR Hold] sodium chloride     sodium chloride     heparin 30,000 units/NS 1000 mL solution for CELLSAVER     heparin Stopped (02/21/23 0651)   PRN: [ZOX Hold] sodium chloride, [MAR Hold] acetaminophen, [MAR Hold] albuterol, Heparin (Porcine) in NaCl, iodixanol, [MAR Hold] nitroGLYCERIN, [MAR Hold] ondansetron (ZOFRAN) IV, [MAR Hold] sodium chloride flush  Assessment: 77 yom with a history of 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  Patient is presenting with SOB / AF. Heparin per pharmacy consult placed for atrial fibrillation.  -plans are to start apixaban tonight after TAVR -apixaban monthly copay= $20  Goal of Therapy:  Heparin level 0.3-0.7 units/ml Monitor  platelets by anticoagulation protocol: Yes   Plan:  -apixaban  po bid -Will educate patient on 4/18  Harland German, PharmD Clinical Pharmacist **Pharmacist phone directory can now be found on [email or web address] ([email or web address]).  Listed under [facility].

## 2023-02-21 NOTE — Anesthesia Procedure Notes (Signed)
Procedure Name: MAC Date/Time: 02/21/2023 8:55 AM  Performed by: Audie Pinto, CRNAPre-anesthesia Checklist: Patient identified Patient Re-evaluated:Patient Re-evaluated prior to induction Oxygen Delivery Method: Simple face mask Induction Type: IV induction Placement Confirmation: positive ETCO2 Dental Injury: Teeth and Oropharynx as per pre-operative assessment

## 2023-02-21 NOTE — Progress Notes (Signed)
Heart Failure Navigator Progress Note  Assessed for Heart & Vascular TOC clinic readiness.  Patient EF 55 % G2DD, No HF TOC, TAVR evaluation. .   Navigator will sign off at this time.   Rhae Hammock, BSN, Scientist, clinical (histocompatibility and immunogenetics) Only

## 2023-02-21 NOTE — CV Procedure (Signed)
HEART AND VASCULAR CENTER  TAVR OPERATIVE NOTE   Date of Procedure:  02/21/2023  Preoperative Diagnosis: Severe Aortic Stenosis   Postoperative Diagnosis: Same   Procedure:   Transcatheter Aortic Valve Replacement - Transfemoral Approach  Edwards Sapien 3 THV (size 29 mm, model # [id number], serial # [id number] )   Co-Surgeons:  Verne Carrow, MD and Clare Charon , MD   Anesthesiologist:  Jean Rosenthal  Echocardiographer:  Croitoru  Pre-operative Echo Findings: Severe aortic stenosis Normal left ventricular systolic function  Post-operative Echo Findings: No paravalvular leak Normal left ventricular systolic function  BRIEF CLINICAL NOTE AND INDICATIONS FOR SURGERY  77 yo male with history of CAD s/p 4V CABG, chronic diastolic CHF, HTN, hyperlipidemia, diabetes, sleep apnea, anxiety, depression, gout, PAD, neuropathy, psoriatic arthritis, chronic kidney disease stage 3, , GERD and severe aortic stenosis. He has been followed by [facility] since 2018 for moderate aortic stenosis. He was admitted to [facility] 01/07/23 with dyspnea, orthopnea and LE edema. Echo 01/08/23 with LVEF=55%. Mild mitral regurgitation. Severe aortic stenosis with mean gradient 32 mmHg, AVA 0.65 cm2, DI 0.18, SVI 27. Cardiac cath 01/11/23 with 2/4 patent bypass grafts (LIMA to LAD, SVG to OM) with collateral filling of the distal RCA. His CAD is felt to be stable. He was discharged from [facility] on 01/12/23 and was doing much better clinically with improvement in LE edema and dyspnea. He was readmitted two days ago with acute CHF.   During the course of the patient's preoperative work up they have been evaluated comprehensively by a multidisciplinary team of specialists coordinated through the Multidisciplinary Heart Valve Clinic in the [facility].  They have been demonstrated to suffer from symptomatic severe aortic stenosis as noted above. The patient has been counseled extensively as to the  relative risks and benefits of all options for the treatment of severe aortic stenosis including long term medical therapy, conventional surgery for aortic valve replacement, and transcatheter aortic valve replacement.  The patient has been independently evaluated by Dr. Delia Chimes with CT surgery and they are felt to be at high risk for conventional surgical aortic valve replacement. The surgeon indicated the patient would be a poor candidate for conventional surgery. Based upon review of all of the patient's preoperative diagnostic tests they are felt to be candidate for transcatheter aortic valve replacement using the transfemoral approach as an alternative to high risk conventional surgery.    Following the decision to proceed with transcatheter aortic valve replacement, a discussion has been held regarding what types of management strategies would be attempted intraoperatively in the event of life-threatening complications, including whether or not the patient would be considered a candidate for the use of cardiopulmonary bypass and/or conversion to open sternotomy for attempted surgical intervention.  The patient has been advised of a variety of complications that might develop peculiar to this approach including but not limited to risks of death, stroke, paravalvular leak, aortic dissection or other major vascular complications, aortic annulus rupture, device embolization, cardiac rupture or perforation, acute myocardial infarction, arrhythmia, heart block or bradycardia requiring permanent pacemaker placement, congestive heart failure, respiratory failure, renal failure, pneumonia, infection, other late complications related to structural valve deterioration or migration, or other complications that might ultimately cause a temporary or permanent loss of functional independence or other long term morbidity.  The patient provides full informed consent for the procedure as described and all questions were  answered preoperatively.    DETAILS OF THE OPERATIVE PROCEDURE  PREPARATION:  The patient is brought to the operating room on the above mentioned date and central monitoring was established by the anesthesia team including placement of a radial arterial line. The patient is placed in the supine position on the operating table.  Intravenous antibiotics are administered. General anesthesia is used.   Baseline transthoracic echocardiogram was performed. The patient's chest, abdomen, both groins, and both lower extremities are prepared and draped in a sterile manner. A time out procedure is performed.   PERIPHERAL ACCESS:   The right neck was prepped and draped. Micropuncture kit used to access the right internal jugular vein with u/s guidance. Using the modified Seldinger technique, femoral arterial access were obtained with placement of a 6 Fr sheath in the artery on the right side using u/s guidance.  A pigtail diagnostic catheter was passed through the femoral arterial sheath under fluoroscopic guidance into the aortic root.  A temporary transvenous pacemaker catheter was passed through the jugular venous sheath under fluoroscopic guidance into the right ventricle.  The pacemaker was tested to ensure stable lead placement and pacemaker capture. Aortic root angiography was performed in order to determine the optimal angiographic angle for valve deployment.  TRANSFEMORAL ACCESS:  A micropuncture kit was used to gain access to the left femoral artery using u/s guidance. Position confirmed with angiography. Pre-closure with double ProGlide closure devices. The patient was heparinized systemically and ACT verified > 250 seconds.    A 16 Fr transfemoral E-sheath was introduced into the left femoral artery after progressively dilating over an Amplatz superstiff wire. A JR4 catheter was used to direct a straight-tip exchange length wire across the native aortic valve into the left ventricle. This was  exchanged out for a pigtail catheter and position was confirmed in the LV apex. Simultaneous LV and Ao pressures were recorded.  The pigtail catheter was then exchanged for a Safari wire in the LV apex.   TRANSCATHETER HEART VALVE DEPLOYMENT:  An Edwards Sapien 3 THV (size 29 mm) was prepared and crimped per manufacturer's guidelines, and the proper orientation of the valve is confirmed on the [employer] delivery system. The valve was advanced through the introducer sheath using normal technique until in an appropriate position in the abdominal aorta beyond the sheath tip. The balloon was then retracted and using the fine-tuning wheel was centered on the valve. The valve was then advanced across the aortic arch using appropriate flexion of the catheter. The valve was carefully positioned across the aortic valve annulus. The Commander catheter was retracted using normal technique. Once final position of the valve has been confirmed by angiographic assessment, the valve is deployed while temporarily holding ventilation and during rapid ventricular pacing to maintain systolic blood pressure < 50 mmHg and pulse pressure < 10 mmHg. The balloon inflation is held for >3 seconds after reaching full deployment volume. Once the balloon has fully deflated the balloon is retracted into the ascending aorta and valve function is assessed using TTE. There is felt to be no paravalvular leak and no central aortic insufficiency.  The patient's hemodynamic recovery following valve deployment is good.  The deployment balloon and guidewire are both removed. Echo demostrated acceptable post-procedural gradients, stable mitral valve function, and no AI.   PROCEDURE COMPLETION:  The sheath was then removed and closure devices were completed. Protamine was administered once femoral arterial repair was complete. The temporary pacemaker was left in place. The pigtail catheter and femoral sheath was removed with a Mynx closure  device placed in the  artery.   The patient tolerated the procedure well and is transported to the surgical intensive care in stable condition. There were no immediate intraoperative complications. All sponge instrument and needle counts are verified correct at completion of the operation.   No blood products were administered during the operation.  The patient received a total of 40 mL of intravenous contrast during the procedure.  LVEDP: 25 mmHg  Verne Carrow MD, [facility] 02/21/2023 11:14 AM

## 2023-02-21 NOTE — Anesthesia Postprocedure Evaluation (Signed)
Anesthesia Post Note  Patient: NIKITA HUMBLE  Procedure(s) Performed: Transcatheter Aortic Valve Replacement, Transfemoral (Left) INTRAOPERATIVE TRANSTHORACIC ECHOCARDIOGRAM     Patient location during evaluation: SICU Anesthesia Type: General Level of consciousness: awake and alert, patient cooperative and oriented Pain management: pain level controlled Vital Signs Assessment: post-procedure vital signs reviewed and stable Respiratory status: spontaneous breathing, nonlabored ventilation, respiratory function stable and patient connected to face mask oxygen Cardiovascular status: blood pressure returned to baseline and stable Postop Assessment: no apparent nausea or vomiting Anesthetic complications: no   There were no known notable events for this encounter.  Last Vitals:  Vitals:   02/21/23 1103 02/21/23 1108  BP:    Pulse: (!) 105 (!) 101  Resp: 19 14  Temp:    SpO2:      Last Pain:  Vitals:   02/21/23 0949  TempSrc:   PainSc: Asleep                 Liza Czerwinski,E. Kelechi Orgeron

## 2023-02-21 NOTE — Progress Notes (Signed)
ANTICOAGULATION CONSULT NOTE - Follow Up Consult  Pharmacy Consult for heparin Indication: atrial fibrillation  Labs: Recent Labs    02/19/23 0823 02/19/23 1434 02/19/23 1434 02/19/23 1644 02/20/23 0311 02/20/23 1115 02/20/23 1548 02/20/23 2033 02/20/23 2142 02/21/23 0113  HGB 11.4* 11.2*  --   --  11.6*  --   --   --   --  12.1*  HCT 38.6* 36.5*  --   --  36.7*  --   --   --   --  39.9  PLT 505* 469*  --   --  470*  --   --   --   --  513*  APTT  --   --   --   --   --   --   --  76*  --   --   LABPROT 14.8  --   --   --   --   --   --   --   --   --   INR 1.2  --   --   --   --   --   --   --   --   --   HEPARINUNFRC  --   --    < >  --  0.29* 0.13*  --   --  0.59 0.83*  CREATININE 1.54* 1.75*  --   --   --  1.37* 1.57*  --   --  1.59*  TROPONINIHS  --  46*  --  44*  --   --   --   --   --   --    < > = values in this interval not displayed.    Assessment: 77yo male supratherapeutic on heparin after one level at goal; no infusion issues or signs of bleeding per RN; plan for TAVR today.  Goal of Therapy:  Heparin level 0.3-0.7 units/ml   Plan:  Will decrease heparin infusion by 10% to 1900 units/hr and f/u after TAVR.    Vernard Gambles, PharmD, BCPS  02/21/2023,3:44 AM

## 2023-02-21 NOTE — Op Note (Signed)
OPERATIVE NOTE: Patient Name: Dylan Bruce Date of Birth: 1946/01/05 Date of Operation: @  PRE-OPERATIVE DIAGNOSIS: Severe, Symptomatic Aortic Stenosis   POST-OPERATIVE DIAGNOSIS: Same   OPERATION: Transfemoral Sapien S3 29mm Aortic Valve Placement  (serial # [id number] )   SURGEON: Waverly Ferrari Jathen Sudano MD   Co-Surgeon: Kathleene Hazel, MD    EBL 10cc   FINDINGS: Good implant depth No significant AI  RIJ temp pacer left in as history of high grade block   SPECIMENS: None   COMPLICATIONS: None   TUBES:  None   PROCEDURE IN DETAIL: The patient was brought in the operating room and laid in supine position.  The patient was prepped and draped in standard fashion. Arterial and venous lines were placed by anesthesia along under sedation. Then he he was subsequently intubated per anesthesia recommendations.   After a timeout was performed, right and left transfemoral access was gained with ultrasound guidance. Venous access in the right IJ was gained via ultrasound and this venous access was used to advance a temporary pacer into the right heart.    On the right femoral artery, a 6Fr sheath was placed and wire and pigtail through this into the noncoronary cusp.    We then used micropuncture techniques and ultrasound to approach the left groin and gained access and placed 2 Perclose devices. Via an 8 Fr sheath an Amplatz wire was placed and then exchanged for a 14 Fr E-sheath. The patient was heparinized systemically and ACT verified > 250 seconds.   Then an AL caltheter was used with flexible straight tip to get into the LV. No BAV performed. Then exchanged over a pigtail for a pre-curled wire.   An Edwards Sapien 3 THV (size 29 mm) was prepared and crimped per manufacturer's guidelines, and the proper orientation of the valve is confirmed on the [employer] delivery system. The valve was advanced through the introducer sheath using normal technique until in an  appropriate position in the abdominal aorta beyond the sheath tip. The balloon was then retracted and using the fine-tuning wheel was centered on the valve. The valve was then advanced across the aortic arch using appropriate flexion of the catheter. The valve was carefully positioned across the aortic valve annulus. The Commander catheter was retracted using normal technique. Once final position of the valve has been confirmed by angiographic assessment, the valve is deployed while temporarily holding ventilation and during rapid ventricular pacing to maintain systolic blood pressure < 50 mmHg and pulse pressure < 10 mmHg. The balloon inflation is held for >3 seconds after reaching full deployment volume. Once the balloon has fully deflated the balloon is retracted into the ascending aorta and valve function is assessed using TTE. There is felt to be no paravalvular leak and no central aortic insufficiency.  The patient's hemodynamic recovery following valve deployment is good.  The deployment balloon and guidewire are both removed. Echo demostrated acceptable post-procedural gradients, stable mitral valve function, and no AI.  Implant depth was excellent. Hemodynamics were good. We then removed the sheath and deployed perclose x 2. Protamine was administered.    Sheaths were removed and closure devices had been completed. I defer to Dr. Clifton James regarding the Mynx closure of artery.    The patient had a stable status and was transferred to the postoperative care unit in stable condition. All surgical counts were correct.

## 2023-02-21 NOTE — Discharge Summary (Incomplete)
[facility]   MULTIDISCIPLINARY HEART VALVE TEAM  Discharge Summary    Patient ID: Dylan Bruce MRN: [id number]; DOB: 1946/08/02  Admit date: 02/19/2023 Discharge date: 02/22/2023  Primary Care Provider: Lindwood Qua, MD  Primary Cardiologist: Maisie Fus, MD /Dr. Clifton James, MD & Dr. Delia Chimes, MD (TAVR)  Discharge Diagnoses    Principal Problem:   Acute CHF (congestive heart failure) Active Problems:   Type 2 diabetes mellitus with hyperlipidemia   Hyperlipidemia   CAD (coronary artery disease) of artery bypass graft   Severe aortic stenosis   Essential hypertension   Acute on chronic diastolic CHF (congestive heart failure)   Chronic kidney disease, stage 3b   S/P TAVR (transcatheter aortic valve replacement)   1st degree AV block   PAF (paroxysmal atrial fibrillation)   Urinary retention  Allergies Allergies  Allergen Reactions   Cefuroxime Diarrhea and Nausea And Vomiting    Severe. Can take Ancef 2g IV on 02/21/2023 without reporting any issue   Ibuprofen Anaphylaxis and Shortness Of Breath        Iodinated Contrast Media Shortness Of Breath and Other (See Comments)    Pt developed burning of face and SOB after 02/06/2023 CT scan    Cefpodoxime Rash   Ciprofloxacin Rash   Doxycycline Rash   Sulfamethoxazole-Trimethoprim Rash   Fluocinolone Other (See Comments)    Unknown reaction   Probenecid Other (See Comments)    Unknown reaction   Allopurinol Rash   Azithromycin Rash   Sulfa Antibiotics Rash    Diagnostic Studies/Procedures     [facility]  TAVR OPERATIVE NOTE     Date of Procedure:                02/21/2023   Preoperative Diagnosis:      Severe Aortic Stenosis    Postoperative Diagnosis:    Same    Procedure:        Transcatheter Aortic Valve Replacement - Transfemoral Approach             Edwards Sapien 3 THV (size 29 mm, model # [id number], serial # [id number] )              Co-Surgeons:                         Verne Carrow, MD and Clare Charon , MD    Anesthesiologist:                  Jean Rosenthal   Echocardiographer:              Croitoru   Pre-operative Echo Findings: Severe aortic stenosis Normal left ventricular systolic function   Post-operative Echo Findings: No paravalvular leak Normal left ventricular systolic function _____________   Echo 02/22/23: completed but pending formal read at the time of discharge   History of Present Illness     Dylan Bruce is a 77 y.o. male with a history of CAD s/p 4V CABG 2003 with PCI of SVG 2010, chronic diastolic CHF, HTN, hyperlipidemia, diabetes, sleep apnea, anxiety, depression, gout, PAD, neuropathy, psoriatic arthritis, chronic kidney disease stage III, GERD and severe aortic stenosis who presented to [facility] on 02/19/23 with acute CHF prior to TAVR scheduled for 02/21/23.   Mr. Feltus is now followed by Dr. Carolan Clines for his cardiology care. He was remotely followed by Dr. Jens Som and then [facility] from 2018 until recently.  He has a hx of  PAD and has undergone several vascular procedures by a vascular surgeon in [place], [place]. His CAD dates back to 2003 when he underwent CABG x4 and subsequent PCI to the SVG to PDA in 2010. He was found to have moderate AS in 2018 by echocardiogram. He was recently admitted to [facility] 01/07/23 with dyspnea, orthopnea and LE edema. Echocardiogram 01/08/23 showed an LVEF at 55% with mild mitral regurgitation, and severe aortic stenosis with mean gradient 32 mmHg, AVA 0.65 cm2, DI 0.18, SVI 27. Cardiac cath 01/11/23 showed 2/4 patent bypass grafts (LIMA to LAD, SVG to OM) with collateral filling of the distal RCA. His CAD was felt to be stable with continued medical treatment. He was discharged from [facility] on 01/12/23 and was doing much better clinically with improvement in LE edema and dyspnea.   He was then seen in structural heart consultation 01/22/23 at which time TAVR was discussed. Plan was to proceed with pre TAVR imaging. He was  then evaluated by the multidisciplinary valve team and felt to have severe, symptomatic aortic stenosis and to be a suitable candidate for TAVR, which was set up for 02/21/23.   He then called the [facility] office 4/10 with weight gain and SOB. He reported doubling his Lasix to help with symptoms. By follow up call, he was feeling much better. He then presented to PAT and was found to be in new atrial flutter, felt to likely be driving his HF symptoms. He was then admitted for IV diuresis and AF control along with TAVR on 02/21/23.  Hospital Course     Acute on chronic HF: Likely multifactorial with new onset atrial flutter and severe aortic stenosis. The patient was admitted for IV diuresis prior to TAVR. LVEDP at . Patient on home torsemide . Appears euvolemic on exam today. Plan to increase torsemide to  QD and reassess at [facility] follow up next week. BMET at that time.   Severe AS: s/p successful TAVR with a 29 mm Edwards Sapien 3 THV via the TF approach on 02/21/23. Post operative echo pending. Groin sites are stable. ECG with AF and no high grade heart block. R IJ temp wire left in place post TAVR with no pacing overnight. Wire removed and patient monitored on telemetry with no issues. Started on Eliquis given new AF at PAT and will continue Plavix given underlying vascular disease. Discussed with Dr. Clifton James. Post op instructions reviewed with understanding. Will require lifelong dental SBE; to be provided at [facility] follow up. He has ambulated with CRI with no issues.   CAD: s/p 4V CABG 2003 with PCI of SVG 2010: Denies anginal symptoms. Continue medical treatment.   HTN: Stable with no changes needed at this time.   HLD: Continue statin  DM2: Covered with SSI while inpatient and resumed on home regimen at discharge. Follow with PCP  CKD Stage III: Baseline Cr appears to be in the 1.5 range. Plan to follow with BMET at appointment next week given increase in torsemide to  QD.   PAD:  Previously taking ASA and Plavix for hx of vascular disease however given new AF, ASA was stopped and patient will remain on Eliquis and Plavix. Check CBC at [facility].   Urinary retention: Foley catheter placed due to residual urinary retention of post TAVR. Plan to d/c this now and monitor post removal. Will need to urinate prior to discharge.   Incidental findings: Left-sided thyroid nodule measuring 2.5 cm. Recommend thyroid ultrasound further evaluation. Indeterminate lesion of the anterior  left kidney is increased in size when compared with prior exam. Recommend renal protocol MRI for further evaluation. Scattered small solid pulmonary nodules measuring up to 4 mm. No follow-up needed if patient is low-risk. Non-contrast chest CT can be considered in 12 months if patient is high-risk.   Consultants: None    The patient has been seen and examined by Dr. Clifton James who feels that he is stable and ready for discharge today, 02/22/23 _____________  Discharge Vitals Blood pressure 132/74, pulse 71, temperature 98.5 F (36.9 C), temperature source Oral, resp. rate 18, height  (1.88 m), weight 98.8 kg, SpO2 100 %.  Filed Weights   02/20/23 1500 02/21/23 0431 02/22/23 0500  Weight: 102.7 kg 102.3 kg 98.8 kg   Labs & Radiologic Studies    CBC Recent Labs    02/21/23 0113 02/21/23 0908 02/21/23 1238 02/22/23 0407  WBC 13.2*  --   --  15.2*  HGB 12.1*   < > 11.9* 11.0*  HCT 39.9   < > 35.0* 36.9*  MCV 82.8  --   --  84.6  PLT 513*  --   --  405*   < > = values in this interval not displayed.   Basic Metabolic Panel Recent Labs    16/10/96 1548 02/21/23 0113 02/21/23 0908 02/21/23 1059 02/21/23 1238 02/22/23 0407  NA 139 132*   < > 139 138 138  K 4.1 4.0   < > 4.2 4.2 4.1  CL 103 101   < > 103  --  103  CO2 27 21*  --   --   --  25  GLUCOSE 193* 317*   < > 286*  --  248*  BUN 37* 40*   < > 37*  --  44*  CREATININE 1.57* 1.59*   < > 1.20  --  1.46*  CALCIUM 9.2 9.2  --    --   --  9.1  MG 1.8  --   --   --   --  2.1   < > = values in this interval not displayed.   Liver Function Tests Recent Labs    02/19/23 1434  AST 39  ALT 48*  ALKPHOS 111  BILITOT 0.6  PROT 6.6  ALBUMIN 3.2*   No results for input(s): "LIPASE", "AMYLASE" in the last 72 hours. Cardiac Enzymes No results for input(s): "CKTOTAL", "CKMB", "CKMBINDEX", "TROPONINI" in the last 72 hours. BNP Invalid input(s): "POCBNP" D-Dimer No results for input(s): "DDIMER" in the last 72 hours. Hemoglobin A1C Recent Labs    02/20/23 2033  HGBA1C 8.5*   Fasting Lipid Panel No results for input(s): "CHOL", "HDL", "LDLCALC", "TRIG", "CHOLHDL", "LDLDIRECT" in the last 72 hours. Thyroid Function Tests Recent Labs    02/20/23 1548  TSH 2.667   _____________  ECHOCARDIOGRAM LIMITED  Result Date: 02/21/2023    ECHOCARDIOGRAM LIMITED REPORT   Patient Name:   Dylan Bruce [facility] Date of Exam: 02/21/2023 Medical Rec #:  [id number]      Height:       74.0 in Accession #:    [id number]     Weight:       225.6 lb Date of Birth:  Oct 20, 1946      BSA:          2.288 m Patient Age:    77 years       BP:           153/93 mmHg Patient Gender: M  HR:           75 bpm. Exam Location:  Inpatient Procedure: Limited Echo, Color Doppler and Cardiac Doppler Indications:     Aortic Stenosis i35.0  History:         Patient has prior history of Echocardiogram examinations, most                  recent 04/10/2023. CHF, CAD; Risk Factors:Hypertension, Diabetes,                  Dyslipidemia and Sleep Apnea.  Sonographer:     Irving Burton Senior RDCS Referring Phys:  [id number] Kathleene Hazel Diagnosing Phys: Thurmon Fair MD  Sonographer Comments: 29mm Edwards [phone] TAVR Implanted  PREOPERATIVE EVALUATION Normal left ventricular systolic function, LV EF 55%. trileaflet aortic valve with severe stenosis. Peak aortic valve gradient 69 mm Hg, mean gradient 45 mm Hg, dimensionless index 0.23, calcualted aortic valve area 1.18 cm sq  (0.5 cm sq/m sq indexed for BSA). There is no aortic insufficiency. There is at least moderate mitral insufficiency with a centrally directed jet. No pericardial effusion. POSTOPERATIVE EVALUATION Normal left ventricular systolic function, LV EF 65%. Well deployed TAVR stent-valve (29 mm Edwards S3U) in the aortic position. Peak aortic valve gradient 5 mm Hg, mean gradient 3 mm Hg, dimensionless index 0.76, calcualted aortic valve area 3.87 cm sq (1.69 cm sq/m sq indexed for BSA). There is no aortic insufficiency/perivalvular leak. Mitral insufficiency appears to be improved, probably mild. No pericardial effusion.  IMPRESSIONS  1. Left ventricular ejection fraction, by estimation, is 55 to 60%. The left ventricle has normal function. The left ventricle has no regional wall motion abnormalities. There is mild concentric left ventricular hypertrophy.  2. Right ventricular systolic function is normal. The right ventricular size is normal.  3. Left atrial size was mildly dilated.  4. The mitral valve is degenerative. Moderate mitral valve regurgitation. FINDINGS  Left Ventricle: Left ventricular ejection fraction, by estimation, is 55 to 60%. The left ventricle has normal function. The left ventricle has no regional wall motion abnormalities. The left ventricular internal cavity size was normal in size. There is  mild concentric left ventricular hypertrophy. Right Ventricle: The right ventricular size is normal. Right ventricular systolic function is normal. Left Atrium: Left atrial size was mildly dilated. Right Atrium: Right atrial size was normal in size. Pericardium: There is no evidence of pericardial effusion. Mitral Valve: The mitral valve is degenerative in appearance. There is mild thickening of the mitral valve leaflet(s). Mild mitral annular calcification. Moderate mitral valve regurgitation. Tricuspid Valve: The tricuspid valve is not well visualized. Tricuspid valve regurgitation is not demonstrated.  Aortic Valve: Aortic valve mean gradient measures 3.0 mmHg. Aortic valve peak gradient measures 5.4 mmHg. Aortic valve area, by VTI measures 3.88 cm. Pulmonic Valve: The pulmonic valve was not well visualized. Pulmonic valve regurgitation is not visualized. Additional Comments: Spectral Doppler performed. Color Doppler performed.  LEFT VENTRICLE PLAX 2D LVOT diam:     2.40 cm LV SV:         84 LV SV Index:   37 LVOT Area:     4.52 cm  AORTIC VALVE AV Area (Vmax):    3.46 cm AV Area (Vmean):   3.64 cm AV Area (VTI):     3.88 cm AV Vmax:           116.00 cm/s AV Vmean:          79.600 cm/s AV VTI:  0.217 m AV Peak Grad:      5.4 mmHg AV Mean Grad:      3.0 mmHg LVOT Vmax:         88.60 cm/s LVOT Vmean:        64.100 cm/s LVOT VTI:          0.186 m LVOT/AV VTI ratio: 0.86  SHUNTS Systemic VTI:  0.19 m Systemic Diam: 2.40 cm Thurmon Fair MD Electronically signed by Thurmon Fair MD Signature Date/Time: 02/21/2023/6:30:20 PM    Final    Structural Heart Procedure  Result Date: 02/21/2023 29 mm Edwards Sapien 3 Ultra Resilia valve from left transfemoral approach. See full op note in the Notes section.   DG Chest 2 View  Result Date: 02/19/2023 CLINICAL DATA:  Preop exam. EXAM: CHEST - 2 VIEW COMPARISON:  January 01, 2021. FINDINGS: Mild peripheral interstitial prominence. No confluent consolidation. No visible pleural effusions or pneumothorax. Cardiomediastinal silhouette is unchanged. CABG and median sternotomy. Partially imaged left reverse shoulder arthroplasty. IMPRESSION: Mild peripheral interstitial prominence, suggestive of mild interstitial edema. No confluent consolidation. Electronically Signed   By: Feliberto Harts M.D.   On: 02/19/2023 14:33   CT CORONARY MORPH W/CTA COR W/SCORE W/CA W/CM &/OR WO/CM  Addendum Date: 02/07/2023   ADDENDUM REPORT: 02/07/2023 10:07 CLINICAL DATA:  Aortic valve replacement (TAVR), pre-op eval EXAM: Cardiac TAVR CT TECHNIQUE: The patient was scanned  on a Siemens Force 192 slice scanner. A 120 kV retrospective scan was triggered in the descending thoracic aorta at 111 HU's. Gantry rotation speed was 270 msecs and collimation was .9 mm. The 3D data set was reconstructed in 5% intervals of the R-R cycle. Systolic and diastolic phases were analyzed on a dedicated work station using MPR, MIP and VRT modes. The patient received 80mL OMNIPAQUE IOHEXOL 350 MG/ML SOLN of contrast. 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 Severely thickened and severely calcified aortic valve cusps. AV calcium score: 6409 Virtual Basal Annulus Measurements: Measured at 15% R-R and 25% R-R, similar valve size range. Maximum/Minimum Diameter: 30.7 x 25.3 mm Perimeter: 86.9 mm Area:  578 mm2 No significant LVOT calcifications. Membranous septal length: 7 mm Based on these measurements, the annulus would be suitable for a 29 mm Sapien 3 valve. Alternatively, Heart Team can consider 34 mm Evolut valve. Recommend Heart Team discussion for valve selection. Sinus of Valsalva Measurements: Non-coronary:  40 mm Right - coronary:  39 mm Left - coronary:  39 mm Sinus of Valsalva Height: Left: 26 mm Right: 24 mm Aorta: Common origin of the brachiocephalic and left common carotid arteries off the aortic arch. Sinotubular Junction:  35 mm Ascending Thoracic Aorta:  40 mm Aortic Arch:  33 mm Descending Thoracic Aorta: 29 mm Coronary Artery Height above Annulus: Left main: 20 mm Right coronary: 20 mm Coronary Arteries: Normal coronary origin. Right dominance. Prior CABG. 3 vessel native CAD. LIMA to LAD patent. SVG to OM patent. SVG to RCA appears stented and occluded distal to stent. SVG to diagonal occluded. Optimum Fluoroscopic Angle for Delivery: RAO 2 CRA 1 OTHER: Fatty metaplasia of the subendocardial left ventricular lateral wall and apex consistent with prior myocardial infarction. See screen captures. Atria: Grossly normal Left atrial appendage: No  thrombus.  Small caliber. Mitral valve: Grossly normal, no mitral annular calcifications, trivial anterior leaflet calcification. Pulmonary artery: Moderately dilated, 38 mm. Pulmonary veins: Normal anatomy. IMPRESSION: 1. Tricuspid aortic valve with severely reduced cusp excursion. Severely thickened and severely calcified aortic  valve cusps. 2. Aortic valve calcium score: 6409 3. Annulus area: 578 mm2, suitable for 29 mm Sapien 3 valve. No LVOT calcifications. Membranous septal length 7 mm. 4. Sufficient coronary artery heights from annulus. 5. Optimum fluoroscopic angle for delivery:  RAO 2 CRA 1 Electronically Signed   By: Weston Brass M.D.   On: 02/07/2023 10:07   Result Date: 02/07/2023 EXAM: OVER-READ INTERPRETATION  CT CHEST The following report is a limited chest CT over-read performed by radiologist Dr. Allegra Lai of [facility] on 02/06/2023. This over-read does not include interpretation of cardiac or coronary anatomy or pathology. The cardiac TAVR interpretation by the cardiologist is attached. COMPARISON:  None Available. FINDINGS: Extracardiac findings will be described separately under dictation for contemporaneously obtained CTA chest, abdomen and pelvis. IMPRESSION: Please see separate dictation for contemporaneously obtained CTA chest, abdomen and pelvis dated 02/06/2023 for full description of relevant extracardiac findings. Electronically Signed: By: Allegra Lai M.D. On: 02/06/2023 12:30   CT ANGIO ABDOMEN PELVIS  W &/OR WO CONTRAST  Result Date: 02/06/2023 CLINICAL DATA:  Aortic valve replacement preop evaluation EXAM: CT ANGIOGRAPHY CHEST, ABDOMEN AND PELVIS TECHNIQUE: Non-contrast CT of the chest was initially obtained. Multidetector CT imaging through the chest, abdomen and pelvis was performed using the standard protocol during bolus administration of intravenous contrast. Multiplanar reconstructed images and MIPs were obtained and reviewed to evaluate the  vascular anatomy. RADIATION DOSE REDUCTION: This exam was performed according to the departmental dose-optimization program which includes automated exposure control, adjustment of the mA and/or kV according to patient size and/or use of iterative reconstruction technique. CONTRAST:  80mL OMNIPAQUE IOHEXOL 350 MG/ML SOLN COMPARISON:  CT abdomen and pelvis dated November 23, 2018 FINDINGS: CTA CHEST FINDINGS Cardiovascular: Cardiomegaly. No pericardial effusion. Normal caliber thoracic aorta with mild atherosclerotic disease. Aortic valve thickening and calcifications. Main and three-vessel coronary artery calcifications status post CABG. Patent LIMA graft. Subendocardial fat deposition of the left ventricular lateral wall, likely due to prior infarct. Mediastinum/Nodes: Esophagus is unremarkable. Left-sided thyroid nodule measuring 2.5 cm. No enlarged lymph nodes seen in the chest. Lungs/Pleura: Central airways are patent. Bilateral mosaic attenuation. No consolidation, pleural effusion or pneumothorax. Scattered small solid pulmonary nodules. Reference nodule of the right upper lobe measuring 4 mm on series 9, image 44. Musculoskeletal: Prior median sternotomy. Prior total left shoulder replacement. No aggressive appearing osseous lesions. CTA ABDOMEN AND PELVIS FINDINGS Hepatobiliary: No focal liver abnormality is seen. No gallstones, gallbladder wall thickening, or biliary dilatation. Pancreas: Unremarkable. No pancreatic ductal dilatation or surrounding inflammatory changes. Spleen: No splenic injury or perisplenic hematoma. Adrenals/Urinary Tract: Bilateral adrenal glands are unremarkable. Hydronephrosis or nephrolithiasis. Indeterminate (HU 30) exophytic lesion of the anterior left kidney measuring 1.5 cm on series 8, image 147, previously measured 1.0 cm. Additional bilateral low-attenuation renal lesions, largest are compatible with simple cysts, others are too small to accurately characterize, no specific  follow-up imaging is recommended. Bladder is unremarkable. Stomach/Bowel: Stomach is within normal limits. Appendix appears normal. Diverticulosis. No evidence of bowel wall thickening, distention, or inflammatory changes. Vascular/lymphatic: Normal caliber abdominal aorta with moderate atherosclerotic disease. Severe narrowing of the proximal SMA due to calcified and noncalcified plaque. No pathologically enlarged lymph nodes seen in the abdomen or pelvis. Reproductive: Prostate is unremarkable. Other: Small ventral abdominal wall hernia containing fat a single nondilated loop of small bowel. No abdominopelvic ascites. Musculoskeletal: Dextrocurvature of the lumbar spine. Old left-sided rib fractures. No aggressive appearing osseous lesions. VASCULAR MEASUREMENTS PERTINENT TO TAVR: AORTA:  Minimal Aortic Diameter-14.1 mm Severity of Aortic Calcification-moderate RIGHT PELVIS: Right Common Iliac Artery - Minimal Diameter-5.1 mm Tortuosity-moderate Calcification-severe Right External Iliac Artery - Minimal Diameter-5.8 mm Tortuosity-moderate Calcification-mild Right Common Femoral Artery - Minimal Diameter-5.0 mm Tortuosity-none Calcification-severe LEFT PELVIS: Left Common Iliac Artery - Minimal Diameter-7.0 mm Tortuosity-mild Calcification-severe Left External Iliac Artery - Minimal Diameter-7.3 mm Tortuosity-moderate Calcification-mild Left Common Femoral Artery - Minimal Diameter-6.5 mm Tortuosity-none Calcification-moderate Review of the MIP images confirms the above findings. IMPRESSION: Vascular: 1. Vascular findings and measurements pertinent to potential TAVR procedure, as detailed above. 2. Thickening and calcification of the aortic valve, compatible with reported clinical history of aortic stenosis. 3. 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 Severe narrowing of the proximal SMA due to calcified and noncalcified plaque. 4. Left main and 3 vessel coronary artery disease. Nonvascular: 1. Left-sided  thyroid nodule measuring 2.5 cm. Recommend thyroid ultrasound further evaluation. 2. Indeterminate lesion of the anterior left kidney is increased in size when compared with prior exam. Recommend renal protocol MRI for further evaluation. 3. Bilateral mosaic attenuation, likely due to air trapping which can be seen in the setting of small airways disease. 4. Scattered small solid pulmonary nodules measuring up to 4 mm. No follow-up needed if patient is low-risk. Non-contrast chest CT can be considered in 12 months if patient is high-risk. This recommendation follows the consensus statement: Guidelines for Management of Incidental Pulmonary Nodules Detected on CT Images: From the Fleischner Society 2017; Radiology 2017; 284:228-243. Electronically Signed   By: Allegra Lai M.D.   On: 02/06/2023 13:50   CT ANGIO CHEST AORTA W/CM & OR WO/CM  Result Date: 02/06/2023 CLINICAL DATA:  Aortic valve replacement preop evaluation EXAM: CT ANGIOGRAPHY CHEST, ABDOMEN AND PELVIS TECHNIQUE: Non-contrast CT of the chest was initially obtained. Multidetector CT imaging through the chest, abdomen and pelvis was performed using the standard protocol during bolus administration of intravenous contrast. Multiplanar reconstructed images and MIPs were obtained and reviewed to evaluate the vascular anatomy. RADIATION DOSE REDUCTION: This exam was performed according to the departmental dose-optimization program which includes automated exposure control, adjustment of the mA and/or kV according to patient size and/or use of iterative reconstruction technique. CONTRAST:  80mL OMNIPAQUE IOHEXOL 350 MG/ML SOLN COMPARISON:  CT abdomen and pelvis dated November 23, 2018 FINDINGS: CTA CHEST FINDINGS Cardiovascular: Cardiomegaly. No pericardial effusion. Normal caliber thoracic aorta with mild atherosclerotic disease. Aortic valve thickening and calcifications. Main and three-vessel coronary artery calcifications status post CABG. Patent  LIMA graft. Subendocardial fat deposition of the left ventricular lateral wall, likely due to prior infarct. Mediastinum/Nodes: Esophagus is unremarkable. Left-sided thyroid nodule measuring 2.5 cm. No enlarged lymph nodes seen in the chest. Lungs/Pleura: Central airways are patent. Bilateral mosaic attenuation. No consolidation, pleural effusion or pneumothorax. Scattered small solid pulmonary nodules. Reference nodule of the right upper lobe measuring 4 mm on series 9, image 44. Musculoskeletal: Prior median sternotomy. Prior total left shoulder replacement. No aggressive appearing osseous lesions. CTA ABDOMEN AND PELVIS FINDINGS Hepatobiliary: No focal liver abnormality is seen. No gallstones, gallbladder wall thickening, or biliary dilatation. Pancreas: Unremarkable. No pancreatic ductal dilatation or surrounding inflammatory changes. Spleen: No splenic injury or perisplenic hematoma. Adrenals/Urinary Tract: Bilateral adrenal glands are unremarkable. Hydronephrosis or nephrolithiasis. Indeterminate (HU 30) exophytic lesion of the anterior left kidney measuring 1.5 cm on series 8, image 147, previously measured 1.0 cm. Additional bilateral low-attenuation renal lesions, largest are compatible with simple cysts, others are too small to accurately characterize, no specific follow-up imaging is recommended.  Bladder is unremarkable. Stomach/Bowel: Stomach is within normal limits. Appendix appears normal. Diverticulosis. No evidence of bowel wall thickening, distention, or inflammatory changes. Vascular/lymphatic: Normal caliber abdominal aorta with moderate atherosclerotic disease. Severe narrowing of the proximal SMA due to calcified and noncalcified plaque. No pathologically enlarged lymph nodes seen in the abdomen or pelvis. Reproductive: Prostate is unremarkable. Other: Small ventral abdominal wall hernia containing fat a single nondilated loop of small bowel. No abdominopelvic ascites. Musculoskeletal:  Dextrocurvature of the lumbar spine. Old left-sided rib fractures. No aggressive appearing osseous lesions. VASCULAR MEASUREMENTS PERTINENT TO TAVR: AORTA: Minimal Aortic Diameter-14.1 mm Severity of Aortic Calcification-moderate RIGHT PELVIS: Right Common Iliac Artery - Minimal Diameter-5.1 mm Tortuosity-moderate Calcification-severe Right External Iliac Artery - Minimal Diameter-5.8 mm Tortuosity-moderate Calcification-mild Right Common Femoral Artery - Minimal Diameter-5.0 mm Tortuosity-none Calcification-severe LEFT PELVIS: Left Common Iliac Artery - Minimal Diameter-7.0 mm Tortuosity-mild Calcification-severe Left External Iliac Artery - Minimal Diameter-7.3 mm Tortuosity-moderate Calcification-mild Left Common Femoral Artery - Minimal Diameter-6.5 mm Tortuosity-none Calcification-moderate Review of the MIP images confirms the above findings. IMPRESSION: Vascular: 1. Vascular findings and measurements pertinent to potential TAVR procedure, as detailed above. 2. Thickening and calcification of the aortic valve, compatible with reported clinical history of aortic stenosis. 3. 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 Severe narrowing of the proximal SMA due to calcified and noncalcified plaque. 4. Left main and 3 vessel coronary artery disease. Nonvascular: 1. Left-sided thyroid nodule measuring 2.5 cm. Recommend thyroid ultrasound further evaluation. 2. Indeterminate lesion of the anterior left kidney is increased in size when compared with prior exam. Recommend renal protocol MRI for further evaluation. 3. Bilateral mosaic attenuation, likely due to air trapping which can be seen in the setting of small airways disease. 4. Scattered small solid pulmonary nodules measuring up to 4 mm. No follow-up needed if patient is low-risk. Non-contrast chest CT can be considered in 12 months if patient is high-risk. This recommendation follows the consensus statement: Guidelines for Management of Incidental Pulmonary  Nodules Detected on CT Images: From the Fleischner Society 2017; Radiology 2017; 284:228-243. Electronically Signed   By: Allegra Lai M.D.   On: 02/06/2023 13:50   Disposition   Pt is being discharged home today in good condition.  Follow-up Plans & Appointments    Follow-up Information     Lindwood Qua, MD. Call.   Specialty: Internal Medicine Why: 03/01/2023 at 3:00 pm Contact information: [address] [place] [address] [phone]         Corrin Parker, PA-C Follow up on 02/26/2023.   Specialty: Cardiology Why: @ 9:15am. Please arrive at 9am. Contact information: [address] [place] [address] [phone]                Discharge Instructions     Call MD for:  difficulty breathing, headache or visual disturbances   Complete by: As directed    Call MD for:  extreme fatigue   Complete by: As directed    Call MD for:  hives   Complete by: As directed    Call MD for:  persistant dizziness or light-headedness   Complete by: As directed    Call MD for:  persistant nausea and vomiting   Complete by: As directed    Call MD for:  redness, tenderness, or signs of infection (pain, swelling, redness, odor or green/yellow discharge around incision site)   Complete by: As directed    Call MD for:  severe uncontrolled pain  Complete by: As directed    Call MD for:  temperature >100.4   Complete by: As directed    Diet - low sodium heart healthy   Complete by: As directed    Discharge instructions   Complete by: As directed    ACTIVITY AND EXERCISE  Daily activity and exercise are an important part of your recovery. People recover at different rates depending on their general health and type of valve procedure.  Most people recovering from TAVR feel better relatively quickly   No lifting, pushing, pulling more than 10 pounds (examples to avoid: groceries, vacuuming, gardening, golfing):             - For one  week with a procedure through the groin.             - For six weeks for procedures through the chest wall or neck. NOTE: You will typically see one of our providers 7-14 days after your procedure to discuss WHEN TO RESUME the above activities.      DRIVING  Do not drive until you are seen for follow up and cleared by a provider. Generally, we ask patient to not drive for 1 week after their procedure.  If you have been told by your doctor in the past that you may not drive, you must talk with him/her before you begin driving again.   DRESSING  Groin site: you may leave the clear dressing over the site for up to one week or until it falls off.   HYGIENE  If you had a femoral (leg) procedure, you may take a shower when you return home. After the shower, pat the site dry. Do NOT use powder, oils or lotions in your groin area until the site has completely healed.  If you had a chest procedure, you may shower when you return home unless specifically instructed not to by your discharging practitioner.             - DO NOT scrub incision; pat dry with a towel.             - DO NOT apply any lotions, oils, powders to the incision.             - No tub baths / swimming for at least 2 weeks.  If you notice any fevers, chills, increased pain, swelling, bleeding or pus, please contact your doctor.   ADDITIONAL INFORMATION  If you are going to have an upcoming dental procedure, please contact our office as you will require antibiotics ahead of time to prevent infection on your heart valve.    If you have any questions or concerns you can call the structural heart phone during normal business hours 8am-4pm. If you have an urgent need after hours or weekends please call [phone] to talk to the on call provider for general cardiology. If you have an emergency that requires immediate attention, please call 911.    After TAVR Checklist  Check  Test Description  Follow up appointment in 1-2  weeks  You will see our structural heart advanced practice provider. Your incision sites will be checked and you will be cleared to drive and resume all normal activities if you are doing well.    1 month echo and follow up  You will have an echo to check on your new heart valve and be seen back in the office by a structural heart advanced practice provider.  Follow up with your primary cardiologist You  will need to be seen by your primary cardiologist in the following 3-6 months after your 1 month appointment in the valve clinic.   1 year echo and follow up You will have another echo to check on your heart valve after 1 year and be seen back in the office by a structural heart advanced practice provider. This your last structural heart visit.  Bacterial endocarditis prophylaxis  You will have to take antibiotics for the rest of your life before all dental procedures (even teeth cleanings) to protect your heart valve. Antibiotics are also required before some surgeries. Please check with your cardiologist before scheduling any surgeries. Also, please make sure to tell us if you have a penicillin allergy as you will require an alternative antibiotic.   Increase activity slowly   Complete by: As directed       Discharge Medications   Allergies as of 02/22/2023       Reactions   Cefuroxime Diarrhea, Nausea And Vomiting   Severe. Can take Ancef 2g IV on 02/21/2023 without reporting any issue   Ibuprofen Anaphylaxis, Shortness Of Breath      Iodinated Contrast Media Shortness Of Breath, Other (See Comments)   Pt developed burning of face and SOB after 02/06/2023 CT scan    Cefpodoxime Rash   Ciprofloxacin Rash   Doxycycline Rash   Sulfamethoxazole-trimethoprim Rash   Fluocinolone Other (See Comments)   Unknown reaction   Probenecid Other (See Comments)   Unknown reaction   Allopurinol Rash   Azithromycin Rash   Sulfa Antibiotics Rash        Medication List     STOP taking these  medications    aspirin EC 81 MG tablet   predniSONE 50 MG tablet Commonly known as: DELTASONE       TAKE these medications    albuterol 108 (90 Base) MCG/ACT inhaler Commonly known as: VENTOLIN HFA Inhale 2 puffs into the lungs every 4 (four) hours as needed for wheezing.   Anucort-HC 25 MG suppository Generic drug: hydrocortisone Place 1 suppository rectally 2 (two) times daily as needed for hemorrhoids.   apixaban 5 MG Tabs tablet Commonly known as: ELIQUIS Take 1 tablet (5 mg total) by mouth 2 (two) times daily.   atorvastatin 80 MG tablet Commonly known as: Lipitor Take 1 tablet (80 mg total) by mouth daily.   budesonide-formoterol 160-4.5 MCG/ACT inhaler Commonly known as: SYMBICORT Inhale 2 puffs into the lungs in the morning and at bedtime.   clobetasol cream 0.05 % Commonly known as: TEMOVATE Apply 1 application topically daily as needed (rash).   clopidogrel 75 MG tablet Commonly known as: PLAVIX Take 1 tablet (75 mg total) by mouth daily.   Cosentyx Sensoready (300 MG) 150 MG/ML Soaj Generic drug: Secukinumab (300 MG Dose) Inject 300 mg into the skin every 30 (thirty) days.   cyclobenzaprine 10 MG tablet Commonly known as: FLEXERIL Take 10 mg by mouth at bedtime.   desonide 0.05 % cream Commonly known as: DESOWEN Apply 1 application topically daily as needed (rash).   docusate sodium 100 MG capsule Commonly known as: COLACE Take 100 mg by mouth daily.   esomeprazole 40 MG capsule Commonly known as: NEXIUM Take 40 mg by mouth daily before breakfast.   ezetimibe 10 MG tablet Commonly known as: ZETIA Take 10 mg by mouth daily.   fluticasone 50 MCG/ACT nasal spray Commonly known as: FLONASE Place 1 spray into both nostrils in the morning and at bedtime.   HumaLOG KwikPen  100 UNIT/ML KiwkPen Generic drug: insulin lispro Inject 18-24 Units into the skin 3 (three) times daily with meals.   hydrOXYzine 25 MG capsule Commonly known as:  VISTARIL Take 25 mg by mouth daily as needed for itching (sleep).   isosorbide mononitrate 30 MG 24 hr tablet Commonly known as: IMDUR Take 1 tablet (30 mg total) by mouth daily. Please schedule appointment for refills.   Jardiance 10 MG Tabs tablet Generic drug: empagliflozin Take 10 mg by mouth daily.   Lantus SoloStar 100 UNIT/ML Solostar Pen Generic drug: insulin glargine Inject 22 Units into the skin at bedtime.   Lyrica 100 MG capsule Generic drug: pregabalin Take 1 capsule (100 mg total) by mouth 2 (two) times daily.   metFORMIN 1000 MG tablet Commonly known as: GLUCOPHAGE Take 1,000 mg by mouth 2 (two) times daily.   mirtazapine 30 MG tablet Commonly known as: REMERON Take 1 tablet by mouth at bedtime.   nitroGLYCERIN 0.4 MG SL tablet Commonly known as: NITROSTAT Place 1 tablet (0.4 mg total) under the tongue every 5 (five) minutes as needed for chest pain. What changed:  when to take this reasons to take this   nystatin cream Commonly known as: MYCOSTATIN Apply 1 Application topically 2 (two) times daily.   nystatin-triamcinolone cream Commonly known as: MYCOLOG II Apply 1 application topically daily as needed (rash).   Percocet 10-325 MG tablet Generic drug: oxyCODONE-acetaminophen Take 1-2 tablets by mouth 2 (two) times daily as needed for pain.   polycarbophil 625 MG tablet Commonly known as: FIBERCON Take 625 mg by mouth daily.   Soolantra 1 % Crea Generic drug: Ivermectin Apply 1 application  topically daily.   tamsulosin 0.4 MG Caps capsule Commonly known as: FLOMAX Take 0.4 capsules by mouth daily.   tiZANidine 4 MG tablet Commonly known as: ZANAFLEX Take 4 mg by mouth at bedtime.   Torsemide 40 MG Tabs Take 40 mg by mouth daily. What changed:  medication strength how much to take   triamcinolone cream 0.1 % Commonly known as: KENALOG Apply 1 Application topically 2 (two) times daily as needed (for itching).   Uloric 40 MG  tablet Generic drug: febuxostat Take 1 tablet by mouth Daily.   Viibryd 40 MG Tabs Generic drug: Vilazodone HCl Take 40 mg by mouth Daily.        Outstanding Labs/Studies   BMET, CBC   Duration of Discharge Encounter   Greater than 30 minutes including physician time.  Signed, Georgie Chard, NP 02/22/2023, 2:06 PM [phone]   I have personally seen and examined this patient. I agree with the assessment and plan as outlined above.  He is doing well today post TAVR. Groins stable. No heart block overnight. Temp wire removed. BP stable.  Will discharge home today after his echo.   Verne Carrow, MD, [facility] 02/22/2023 3:18 PM

## 2023-02-21 NOTE — Progress Notes (Signed)
Patient had home meds in his closet. And took an over the counter nasal spray and A Percocet from home. Meds removed from room and sent to [facility] . There are 4 bags of meds.Patient not happy that meds are in pharm. Janann August. aware. Patient also got skin tear on right lower leg. He said he hit his leg on something. Clean wound and applied foam.Enc. Patient multi times throughout the night to call for help and not get up . He does not call for help .Bed alarm has been on. He turns it off at times.M.D. on call for Cardiology text page to get patient order for Percocet. No response yet

## 2023-02-21 NOTE — Discharge Instructions (Signed)

## 2023-02-21 NOTE — Anesthesia Procedure Notes (Signed)
Arterial Line Insertion Start/End4/17/2024 8:12 AM, 02/21/2023 8:21 AM Performed by: Jairo Ben, MD, Audie Pinto, CRNA, anesthesiologist  Patient location: Pre-op. Preanesthetic checklist: patient identified, IV checked, risks and benefits discussed, surgical consent, monitors and equipment checked, pre-op evaluation, timeout performed and anesthesia consent Lidocaine 1% used for infiltration Right, radial was placed Catheter size: 20 G Hand hygiene performed  and maximum sterile barriers used   Attempts: 2 Procedure performed using ultrasound guided technique. Ultrasound Notes:anatomy identified, needle tip was noted to be adjacent to the nerve/plexus identified, no ultrasound evidence of intravascular and/or intraneural injection and image(s) printed for medical record Following insertion, dressing applied and Biopatch. Post procedure assessment: normal  Patient tolerated the procedure well with no immediate complications.

## 2023-02-21 NOTE — Progress Notes (Signed)
Carmie Kanner came to patient's room to assist placing foley catheter on patient. While prepping patient for placement one white oblong pill with M523/10-325 was found in the patient's bed in his gown. Patient stated that his daughter, who has rheumatoid arthritis, gave him the pill just incase he needed it. Carmie Kanner, RN then educated the patient on the importance of only taking medications within the hospital that have been prescribed. I stayed with the patient while second RN took the pill to the pharmacist. When second RN returned to room she asked the patient if there were an other medications in the room that we needed to be aware of. He then said that he sent all other medications to pharmacy. [place] asked patient if it was okay if she searched through his patient belong bags to make sure that all medications from home had been sent down to pharmacy. Patient stated, "That's fine." Searched patient belonging bags and no additional medications identified. Searched patient home CPAP machine and found a sock that had pills in it. Patient was told that pills had been found in his CPAP machine and he responded by saying, "Okay I am going to tell you the truth..I have really bad pain issues that have been overlooked during prior hospitalizations.I just wanted to keep some extra pills just incase I needed them." Continued education with the patient that he had pain medication ordered that would be given within order parameters as the patient needed them. I tried to contact the physician, cath lab staff reported Dr. Clifton James was in a procedure, I paged the mid-level, Georgie Chard, NP twice, with no response. RN stated that we would secure his medications in a safe place in pharmacy and at the same time patient grabbed sock filled with medication out of nurses hand. 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 I have been taking them for years. I can't just stop taking  them." RN continued to remind patient that we will be frequently assessing his pain and that pain medications are ordered. RN told patient that we can't keep narcotics unsecured in a hospital for the safety of him and others. Patient gave RN all but two pills from the sock. Patient stated, "You can have all of those but I can't give you the last two." Patient continued to vocalize that he was worried nurses would give him less than the larger dose, forget to give it to him, or refuse to give it to him when he needed it. RN continued to build a trusting relationship with patient and help the patient understand that we want him to be comfortable and we don't want him to be in pain either. Patient was supported and encouraged. Patient gave last two pills to RN.   All pills were taken and placed in a bag. Medication was verified again by Carmie Kanner and Calton Dach. Pills were counted. There were 21 white oblong pills labeled M523/10-325. One of these pills were already split in half and both halves were present. Pharmacy med sheet was logged and medication was taken to main pharmacy for storage. Copy of sheet places in patient's chart.

## 2023-02-21 NOTE — Progress Notes (Signed)
Orthopedic Tech Progress Note Patient Details:  Dylan Bruce July 29, 1946 [id number] Knee Immobilizers were delivered to the patients room. Patient was getting an in and out catheter so RN will apply knee immobilizers afterwards.  Ortho Devices Type of Ortho Device: Knee Immobilizer Ortho Device/Splint Location: Bi LE Ortho Device/Splint Interventions: Ordered      Dylan Bruce 02/21/2023, 1:21 PM

## 2023-02-21 NOTE — Transfer of Care (Signed)
Immediate Anesthesia Transfer of Care Note  Patient: Dylan Bruce  Procedure(s) Performed: Transcatheter Aortic Valve Replacement, Transfemoral (Left) INTRAOPERATIVE TRANSTHORACIC ECHOCARDIOGRAM  Patient Location: ICU  Anesthesia Type:General  Level of Consciousness: drowsy and patient cooperative  Airway & Oxygen Therapy: Patient Spontanous Breathing and Patient connected to face mask oxygen  Post-op Assessment: Report given to RN and Post -op Vital signs reviewed and stable  Post vital signs: Reviewed and stable  Last Vitals:  Vitals Value Taken Time  BP 126/46 (aline) 02/21/2023 1147  Temp    Pulse 64 02/21/23 1147  Resp 21 02/21/23 1147  SpO2 99 % 02/21/23 1147  Vitals shown include unvalidated device data.  Last Pain:  Vitals:   02/21/23 0949  TempSrc:   PainSc: Asleep         Complications: There were no known notable events for this encounter.

## 2023-02-21 NOTE — H&P (Signed)
No significant change in history, plan for TAVR today.   [address]       [place] [address]             [phone]                                                   Dylan Bruce [facility] Medical Record #[id number] Date of Birth: 04-Jul-1946   Referring: Dylan Bruce* Primary Care: Dylan Qua, MD Primary Cardiologist: Dylan Fus, MD   Chief Complaint:        Chief Complaint  Patient presents with   Aortic Stenosis      Consult for TAVR      History of Present Illness:    Dylan Bruce 77 y.o. male referred in consultation for transcatheter aortic valve implantation.    Lives at home with wife and without a health aide.    At up 9 lb since Tuesday   Was in the hospital with this.    Her HPI per cardiology involves " 77 y.o. male with hx CAD s/p CABG, EF normal, had mid-distal latera/anterolateral wall motion abn. He has known occluded SVG-Diag. He presented with acute decompensated HFpEF with orthopnea/PND/ BNP 13,500 . His trop was significant 11K--> down to 4k. He is CP free. His rhythm is stable, sinus; wenckebach. Stopped BB. Planning on LHC/RHC today, now that he can lie at about 30 degrees. He has more severe AS, with gradient 35 mmhg and AVA 0.65 cm2. We discussed outpatient TAVR eval. If symptoms do not resolve and wedge not too high, can consider inpatient eval.   "   I have independently confirmed increasing symptoms of valvular heart disease   He's having more leg edema. He manages most meds with PCP. He doesn't follow as closely with cards he says Plan is call PCP And get this managed. He understands if much worse may need admission and inpatient TAVR         Past Medical History:  Diagnosis Date   Aortic stenosis     ARTHRITIS     ASTHMA     CAD (coronary artery disease)     CHF (congestive heart failure) (HCC)     DM     GERD     HYPERLIPIDEMIA     Hypertension     PSORIASIS     SLEEP APNEA     SPINA BIFIDA              Past Surgical History:  Procedure Laterality Date   BACK SURGERY       CARPAL TUNNEL RELEASE Left 08/21/2019    Procedure: CARPAL TUNNEL RELEASE;  Surgeon: Cindee Salt, MD;  Location: [facility];  Service: Orthopedics;  Laterality: Left;  AXILLARY BLOCK   CORONARY ARTERY BYPASS GRAFT       JOINT REPLACEMENT Left      shoulder   LEFT HEART CATH AND CORS/GRAFTS ANGIOGRAPHY N/A 06/18/2018    Procedure: LEFT HEART CATH AND CORS/GRAFTS ANGIOGRAPHY;  Surgeon: Runell Gess, MD;  Location: [facility];  Service: Cardiovascular;  Laterality: N/A;   RIGHT/LEFT HEART CATH AND CORONARY/GRAFT ANGIOGRAPHY N/A 01/11/2023    Procedure: RIGHT/LEFT HEART CATH AND CORONARY/GRAFT ANGIOGRAPHY;  Surgeon: Dylan Hazel, MD;  Location: [facility];  Service:  Cardiovascular;  Laterality: N/A;   TRIGGER FINGER RELEASE Left 08/21/2019    Procedure: RELEASE TRIGGER LEFT SMALL FINGER LEFT INDEX;  Surgeon: Cindee Salt, MD;  Location: [facility];  Service: Orthopedics;  Laterality: Left;   ULNAR NERVE TRANSPOSITION Left 08/21/2019    Procedure: DECOMPRESSION WITH ULNAR NERVE LEFT CUBITAL TUNNEL ULNAR;  Surgeon: Cindee Salt, MD;  Location: [facility];  Service: Orthopedics;  Laterality: Left;           Family History  Problem Relation Age of Onset   Valvular heart disease Father     Prostate cancer Father          Social History       Tobacco Use  Smoking Status Never  Smokeless Tobacco Never    Social History       Substance and Sexual Activity  Alcohol Use Not Currently             Allergies  Allergen Reactions   Cefuroxime Diarrhea and Nausea And Vomiting      Severe   Ibuprofen Anaphylaxis and Shortness Of Breath            Cefpodoxime Rash   Ciprofloxacin Rash   Doxycycline Rash   Sulfamethoxazole-Trimethoprim Rash   Fluocinolone Other (See Comments)      unknown   Probenecid Other (See Comments)       unknown   Allopurinol Rash   Azithromycin Rash   Sulfa Antibiotics Rash            Current Outpatient Medications  Medication Sig Dispense Refill   albuterol (PROVENTIL HFA;VENTOLIN HFA) 108 (90 Base) MCG/ACT inhaler Inhale 2 puffs into the lungs every 4 (four) hours as needed for wheezing.       aspirin EC 81 MG EC tablet Take 1 tablet (81 mg total) by mouth daily. 90 tablet 3   atorvastatin (LIPITOR) 80 MG tablet Take 1 tablet (80 mg total) by mouth daily. 30 tablet 0   BYDUREON 2 MG SUSR Inject 2 mg into the skin once a week.       clobetasol cream (TEMOVATE) 0.05 % Apply 1 application topically daily as needed (rash).        clopidogrel (PLAVIX) 75 MG tablet Take 1 tablet (75 mg total) by mouth daily. 90 tablet 3   COSENTYX SENSOREADY, 300 MG, 150 MG/ML SOAJ Inject 300 mg into the skin every 30 (thirty) days.       cyclobenzaprine (FLEXERIL) 10 MG tablet Take 10 mg by mouth at bedtime.        desonide (DESOWEN) 0.05 % cream Apply 1 application topically daily as needed (rash).        docusate sodium (COLACE) 100 MG capsule Take 100 mg by mouth daily.        esomeprazole (NEXIUM) 40 MG capsule Take 40 mg by mouth daily before breakfast.       ezetimibe (ZETIA) 10 MG tablet Take 10 mg by mouth daily.       FINACEA 15 % cream Apply 1 application topically 2 (two) times daily as needed (rash).        hydrocortisone (ANUCORT-HC) 25 MG suppository Place 1 suppository rectally 2 (two) times daily as needed for hemorrhoids.       hydrOXYzine (VISTARIL) 25 MG capsule TAKE ONE CAPSULE BY MOUTH EVERY 6 HOURS AS NEEDED       insulin lispro (HUMALOG KWIKPEN) 100 UNIT/ML KiwkPen Inject 18 Units into the skin 3 (three) times  daily with meals.        isosorbide mononitrate (IMDUR) 30 MG 24 hr tablet Take 1 tablet (30 mg total) by mouth daily. Please schedule appointment for refills. 30 tablet 0   LANTUS SOLOSTAR 100 UNIT/ML Solostar Pen Inject 22 Units into the skin at bedtime.   5   LYRICA 100 MG  capsule Take 1 capsule (100 mg total) by mouth 2 (two) times daily.   5   metoprolol tartrate (LOPRESSOR) 50 MG tablet Take as directed prior to 3/27 CT scan 1 tablet 0   mirtazapine (REMERON) 30 MG tablet Take 1 tablet by mouth at bedtime.        nitroGLYCERIN (NITROSTAT) 0.4 MG SL tablet Place 1 tablet (0.4 mg total) under the tongue every 5 (five) minutes as needed for chest pain. (Patient taking differently: Place 1.2 mg under the tongue at bedtime.) 90 tablet 3   nystatin-triamcinolone (MYCOLOG II) cream Apply 1 application topically daily as needed (rash).        oxyCODONE-acetaminophen (PERCOCET) 10-325 MG per tablet Take 1-2 tablets by mouth 2 (two) times daily as needed for pain.        polycarbophil (FIBERCON) 625 MG tablet Take 625 mg by mouth daily.       sodium fluoride (PREVIDENT) 1.1 % GEL dental gel Place 1 application onto teeth daily.        SOOLANTRA 1 % CREA Apply 1 application  topically daily as needed (rash).   3   tamsulosin (FLOMAX) 0.4 MG CAPS capsule Take 0.4 capsules by mouth daily.       torsemide (DEMADEX) 20 MG tablet Take 1 tablet (20 mg total) by mouth daily. (Take 2 tablets in case of weight gain of 2 to 3 lbs in 24 hrs or 5 lbs in 7 days. 30 tablet 0   ULORIC 40 MG tablet Take 1 tablet by mouth Daily.       VIIBRYD 40 MG TABS Take 40 mg by mouth Daily.         No current facility-administered medications for this visit.      ROS 14 point ROS reviewed and negative except as per HPI     PHYSICAL EXAMINATION: BP 118/77   Pulse 81   Resp 20   Ht  (1.88 m)   Wt 229 lb (103.9 kg)   SpO2 92% Comment: RA  BMI 29.40 kg/m    Gen: NAD Neuro: Alert and oriented CV: + systolic murmur Resp: Nonlaboured Abd: Soft, ntnd Extr: WWP, 3+ LE edema   Diagnostic Studies & Laboratory data:     Recent Radiology Findings:    Imaging Results  CT CORONARY MORPH W/CTA COR W/SCORE W/CA W/CM &/OR WO/CM   Addendum Date: 02/07/2023   ADDENDUM REPORT: 02/07/2023  10:07 CLINICAL DATA:  Aortic valve replacement (TAVR), pre-op eval EXAM: Cardiac TAVR CT TECHNIQUE: The patient was scanned on a Siemens Force 192 slice scanner. A 120 kV retrospective scan was triggered in the descending thoracic aorta at 111 HU's. Gantry rotation speed was 270 msecs and collimation was .9 mm. The 3D data set was reconstructed in 5% intervals of the R-R cycle. Systolic and diastolic phases were analyzed on a dedicated work station using MPR, MIP and VRT modes. The patient received 80mL OMNIPAQUE IOHEXOL 350 MG/ML SOLN of contrast. 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 Severely thickened and severely calcified aortic valve cusps. AV calcium score: 6409 Virtual Basal Annulus Measurements: Measured at 15%  R-R and 25% R-R, similar valve size range. Maximum/Minimum Diameter: 30.7 x 25.3 mm Perimeter: 86.9 mm Area:  578 mm2 No significant LVOT calcifications. Membranous septal length: 7 mm Based on these measurements, the annulus would be suitable for a 29 mm Sapien 3 valve. Alternatively, Heart Team can consider 34 mm Evolut valve. Recommend Heart Team discussion for valve selection. Sinus of Valsalva Measurements: Non-coronary:  40 mm Right - coronary:  39 mm Left - coronary:  39 mm Sinus of Valsalva Height: Left: 26 mm Right: 24 mm Aorta: Common origin of the brachiocephalic and left common carotid arteries off the aortic arch. Sinotubular Junction:  35 mm Ascending Thoracic Aorta:  40 mm Aortic Arch:  33 mm Descending Thoracic Aorta: 29 mm Coronary Artery Height above Annulus: Left main: 20 mm Right coronary: 20 mm Coronary Arteries: Normal coronary origin. Right dominance. Prior CABG. 3 vessel native CAD. LIMA to LAD patent. SVG to OM patent. SVG to RCA appears stented and occluded distal to stent. SVG to diagonal occluded. Optimum Fluoroscopic Angle for Delivery: RAO 2 CRA 1 OTHER: Fatty metaplasia of the subendocardial left ventricular lateral wall  and apex consistent with prior myocardial infarction. See screen captures. Atria: Grossly normal Left atrial appendage: No thrombus.  Small caliber. Mitral valve: Grossly normal, no mitral annular calcifications, trivial anterior leaflet calcification. Pulmonary artery: Moderately dilated, 38 mm. Pulmonary veins: Normal anatomy. IMPRESSION: 1. Tricuspid aortic valve with severely reduced cusp excursion. Severely thickened and severely calcified aortic valve cusps. 2. Aortic valve calcium score: 6409 3. Annulus area: 578 mm2, suitable for 29 mm Sapien 3 valve. No LVOT calcifications. Membranous septal length 7 mm. 4. Sufficient coronary artery heights from annulus. 5. Optimum fluoroscopic angle for delivery:  RAO 2 CRA 1 Electronically Signed   By: Weston Brass M.D.   On: 02/07/2023 10:07    Result Date: 02/07/2023 EXAM: OVER-READ INTERPRETATION  CT CHEST The following report is a limited chest CT over-read performed by radiologist Dr. Allegra Lai of [facility] on 02/06/2023. This over-read does not include interpretation of cardiac or coronary anatomy or pathology. The cardiac TAVR interpretation by the cardiologist is attached. COMPARISON:  None Available. FINDINGS: Extracardiac findings will be described separately under dictation for contemporaneously obtained CTA chest, abdomen and pelvis. IMPRESSION: Please see separate dictation for contemporaneously obtained CTA chest, abdomen and pelvis dated 02/06/2023 for full description of relevant extracardiac findings. Electronically Signed: By: Allegra Lai M.D. On: 02/06/2023 12:30    CT ANGIO ABDOMEN PELVIS  W &/OR WO CONTRAST   Result Date: 02/06/2023 CLINICAL DATA:  Aortic valve replacement preop evaluation EXAM: CT ANGIOGRAPHY CHEST, ABDOMEN AND PELVIS TECHNIQUE: Non-contrast CT of the chest was initially obtained. Multidetector CT imaging through the chest, abdomen and pelvis was performed using the standard protocol during bolus  administration of intravenous contrast. Multiplanar reconstructed images and MIPs were obtained and reviewed to evaluate the vascular anatomy. RADIATION DOSE REDUCTION: This exam was performed according to the departmental dose-optimization program which includes automated exposure control, adjustment of the mA and/or kV according to patient size and/or use of iterative reconstruction technique. CONTRAST:  80mL OMNIPAQUE IOHEXOL 350 MG/ML SOLN COMPARISON:  CT abdomen and pelvis dated November 23, 2018 FINDINGS: CTA CHEST FINDINGS Cardiovascular: Cardiomegaly. No pericardial effusion. Normal caliber thoracic aorta with mild atherosclerotic disease. Aortic valve thickening and calcifications. Main and three-vessel coronary artery calcifications status post CABG. Patent LIMA graft. Subendocardial fat deposition of the left ventricular lateral wall, likely due to prior infarct.  Mediastinum/Nodes: Esophagus is unremarkable. Left-sided thyroid nodule measuring 2.5 cm. No enlarged lymph nodes seen in the chest. Lungs/Pleura: Central airways are patent. Bilateral mosaic attenuation. No consolidation, pleural effusion or pneumothorax. Scattered small solid pulmonary nodules. Reference nodule of the right upper lobe measuring 4 mm on series 9, image 44. Musculoskeletal: Prior median sternotomy. Prior total left shoulder replacement. No aggressive appearing osseous lesions. CTA ABDOMEN AND PELVIS FINDINGS Hepatobiliary: No focal liver abnormality is seen. No gallstones, gallbladder wall thickening, or biliary dilatation. Pancreas: Unremarkable. No pancreatic ductal dilatation or surrounding inflammatory changes. Spleen: No splenic injury or perisplenic hematoma. Adrenals/Urinary Tract: Bilateral adrenal glands are unremarkable. Hydronephrosis or nephrolithiasis. Indeterminate (HU 30) exophytic lesion of the anterior left kidney measuring 1.5 cm on series 8, image 147, previously measured 1.0 cm. Additional bilateral  low-attenuation renal lesions, largest are compatible with simple cysts, others are too small to accurately characterize, no specific follow-up imaging is recommended. Bladder is unremarkable. Stomach/Bowel: Stomach is within normal limits. Appendix appears normal. Diverticulosis. No evidence of bowel wall thickening, distention, or inflammatory changes. Vascular/lymphatic: Normal caliber abdominal aorta with moderate atherosclerotic disease. Severe narrowing of the proximal SMA due to calcified and noncalcified plaque. No pathologically enlarged lymph nodes seen in the abdomen or pelvis. Reproductive: Prostate is unremarkable. Other: Small ventral abdominal wall hernia containing fat a single nondilated loop of small bowel. No abdominopelvic ascites. Musculoskeletal: Dextrocurvature of the lumbar spine. Old left-sided rib fractures. No aggressive appearing osseous lesions. VASCULAR MEASUREMENTS PERTINENT TO TAVR: AORTA: Minimal Aortic Diameter-14.1 mm Severity of Aortic Calcification-moderate RIGHT PELVIS: Right Common Iliac Artery - Minimal Diameter-5.1 mm Tortuosity-moderate Calcification-severe Right External Iliac Artery - Minimal Diameter-5.8 mm Tortuosity-moderate Calcification-mild Right Common Femoral Artery - Minimal Diameter-5.0 mm Tortuosity-none Calcification-severe LEFT PELVIS: Left Common Iliac Artery - Minimal Diameter-7.0 mm Tortuosity-mild Calcification-severe Left External Iliac Artery - Minimal Diameter-7.3 mm Tortuosity-moderate Calcification-mild Left Common Femoral Artery - Minimal Diameter-6.5 mm Tortuosity-none Calcification-moderate Review of the MIP images confirms the above findings. IMPRESSION: Vascular: 1. Vascular findings and measurements pertinent to potential TAVR procedure, as detailed above. 2. Thickening and calcification of the aortic valve, compatible with reported clinical history of aortic stenosis. 3. 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 Severe narrowing of the  proximal SMA due to calcified and noncalcified plaque. 4. Left main and 3 vessel coronary artery disease. Nonvascular: 1. Left-sided thyroid nodule measuring 2.5 cm. Recommend thyroid ultrasound further evaluation. 2. Indeterminate lesion of the anterior left kidney is increased in size when compared with prior exam. Recommend renal protocol MRI for further evaluation. 3. Bilateral mosaic attenuation, likely due to air trapping which can be seen in the setting of small airways disease. 4. Scattered small solid pulmonary nodules measuring up to 4 mm. No follow-up needed if patient is low-risk. Non-contrast chest CT can be considered in 12 months if patient is high-risk. This recommendation follows the consensus statement: Guidelines for Management of Incidental Pulmonary Nodules Detected on CT Images: From the Fleischner Society 2017; Radiology 2017; [id number]. Electronically Signed   By: Allegra Lai M.D.   On: 02/06/2023 13:50    CT ANGIO CHEST AORTA W/CM & OR WO/CM   Result Date: 02/06/2023 CLINICAL DATA:  Aortic valve replacement preop evaluation EXAM: CT ANGIOGRAPHY CHEST, ABDOMEN AND PELVIS TECHNIQUE: Non-contrast CT of the chest was initially obtained. Multidetector CT imaging through the chest, abdomen and pelvis was performed using the standard protocol during bolus administration of intravenous contrast. Multiplanar reconstructed images and MIPs were obtained and reviewed to evaluate the vascular anatomy.  RADIATION DOSE REDUCTION: This exam was performed according to the departmental dose-optimization program which includes automated exposure control, adjustment of the mA and/or kV according to patient size and/or use of iterative reconstruction technique. CONTRAST:  80mL OMNIPAQUE IOHEXOL 350 MG/ML SOLN COMPARISON:  CT abdomen and pelvis dated November 23, 2018 FINDINGS: CTA CHEST FINDINGS Cardiovascular: Cardiomegaly. No pericardial effusion. Normal caliber thoracic aorta with mild atherosclerotic  disease. Aortic valve thickening and calcifications. Main and three-vessel coronary artery calcifications status post CABG. Patent LIMA graft. Subendocardial fat deposition of the left ventricular lateral wall, likely due to prior infarct. Mediastinum/Nodes: Esophagus is unremarkable. Left-sided thyroid nodule measuring 2.5 cm. No enlarged lymph nodes seen in the chest. Lungs/Pleura: Central airways are patent. Bilateral mosaic attenuation. No consolidation, pleural effusion or pneumothorax. Scattered small solid pulmonary nodules. Reference nodule of the right upper lobe measuring 4 mm on series 9, image 44. Musculoskeletal: Prior median sternotomy. Prior total left shoulder replacement. No aggressive appearing osseous lesions. CTA ABDOMEN AND PELVIS FINDINGS Hepatobiliary: No focal liver abnormality is seen. No gallstones, gallbladder wall thickening, or biliary dilatation. Pancreas: Unremarkable. No pancreatic ductal dilatation or surrounding inflammatory changes. Spleen: No splenic injury or perisplenic hematoma. Adrenals/Urinary Tract: Bilateral adrenal glands are unremarkable. Hydronephrosis or nephrolithiasis. Indeterminate (HU 30) exophytic lesion of the anterior left kidney measuring 1.5 cm on series 8, image 147, previously measured 1.0 cm. Additional bilateral low-attenuation renal lesions, largest are compatible with simple cysts, others are too small to accurately characterize, no specific follow-up imaging is recommended. Bladder is unremarkable. Stomach/Bowel: Stomach is within normal limits. Appendix appears normal. Diverticulosis. No evidence of bowel wall thickening, distention, or inflammatory changes. Vascular/lymphatic: Normal caliber abdominal aorta with moderate atherosclerotic disease. Severe narrowing of the proximal SMA due to calcified and noncalcified plaque. No pathologically enlarged lymph nodes seen in the abdomen or pelvis. Reproductive: Prostate is unremarkable. Other: Small ventral  abdominal wall hernia containing fat a single nondilated loop of small bowel. No abdominopelvic ascites. Musculoskeletal: Dextrocurvature of the lumbar spine. Old left-sided rib fractures. No aggressive appearing osseous lesions. VASCULAR MEASUREMENTS PERTINENT TO TAVR: AORTA: Minimal Aortic Diameter-14.1 mm Severity of Aortic Calcification-moderate RIGHT PELVIS: Right Common Iliac Artery - Minimal Diameter-5.1 mm Tortuosity-moderate Calcification-severe Right External Iliac Artery - Minimal Diameter-5.8 mm Tortuosity-moderate Calcification-mild Right Common Femoral Artery - Minimal Diameter-5.0 mm Tortuosity-none Calcification-severe LEFT PELVIS: Left Common Iliac Artery - Minimal Diameter-7.0 mm Tortuosity-mild Calcification-severe Left External Iliac Artery - Minimal Diameter-7.3 mm Tortuosity-moderate Calcification-mild Left Common Femoral Artery - Minimal Diameter-6.5 mm Tortuosity-none Calcification-moderate Review of the MIP images confirms the above findings. IMPRESSION: Vascular: 1. Vascular findings and measurements pertinent to potential TAVR procedure, as detailed above. 2. Thickening and calcification of the aortic valve, compatible with reported clinical history of aortic stenosis. 3. 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 Severe narrowing of the proximal SMA due to calcified and noncalcified plaque. 4. Left main and 3 vessel coronary artery disease. Nonvascular: 1. Left-sided thyroid nodule measuring 2.5 cm. Recommend thyroid ultrasound further evaluation. 2. Indeterminate lesion of the anterior left kidney is increased in size when compared with prior exam. Recommend renal protocol MRI for further evaluation. 3. Bilateral mosaic attenuation, likely due to air trapping which can be seen in the setting of small airways disease. 4. Scattered small solid pulmonary nodules measuring up to 4 mm. No follow-up needed if patient is low-risk. Non-contrast chest CT can be considered in 12 months if  patient is high-risk. This recommendation follows the consensus statement: Guidelines for Management of Incidental Pulmonary Nodules  Detected on CT Images: From the Fleischner Society 2017; Radiology 2017; [phone]. Electronically Signed   By: Allegra Lai M.D.   On: 02/06/2023 13:50    CARDIAC CATHETERIZATION   Result Date: 01/11/2023   Prox LAD lesion is 100% stenosed.   Mid RCA lesion is 100% stenosed.   Origin to Prox Graft lesion is 100% stenosed.   Origin to Prox Graft lesion is 100% stenosed.   Ost 1st Diag lesion is 90% stenosed.   Ost 1st Mrg lesion is 100% stenosed.   Prox RCA lesion is 60% stenosed.   Mid LAD to Dist LAD lesion is 50% stenosed.   Ost LM to Mid LM lesion is 40% stenosed.   SVG graft was not injected.   SVG graft was not injected.   LIMA graft was visualized by angiography. Chronic total occlusion of the mid LAD. The mid and distal LAD has diffuse moderate disease and fills from the patent LIMA graft.  The small to moderate caliber diagonal branch has diffuse severe disease, unchanged from last cath. The SVG to the diagonal is known to be occluded. The Circumflex has mild proximal stenosis. The first OM branch is chronically occluded and fills from the patent SVG graft. The large dominant RCA has chronic total occlusion in the mid vessel. The SVG to the distal RCA is known to be occluded. The distal RCA branches fill from left to right collaterals. RA 1 RV 30/0/3 PA 33/12 mean 24 PCWP 4 AO 105/54 Recommendations: Continue medical management of CAD. Diffuse three vessel CAD with continued patency of the LIMA to LAD and SVG to OM. No focal targets for PCI. Normal right heart pressures. Will continue workup for TAVR. He will likely need to have his Pre-TAVR CT scans staged a week from now given the contrast load today. He can be discharged home tomorrow if stable and we can see him in our structural heart office to discuss TAVR in detail and plan his scans.           I have  independently reviewed the above radiology studies  and reviewed the findings with the patient.    Recent Lab Findings:       Assessment / Plan:   DEMARRIO MENGES 77 y.o. male referred in consultation for transcatheter aortic valve implantation   + Severe, symptomatic aortic stenosis.   Meets criteria for AV replacement.   STS risk is 2.55%   Risks/benefits/alternatives of TAVR were discussed at length (90% standard recovery, 6-9% morbidity [any organ, typically access site vascular complication needing surgery, stroke, or pacemaker] and <1% mortality. Options are TAVR, SAVR or medical treatment. Patient understands SAVR is typically higher risk and takes longer recovery than TAVR in elderly patients. As well, discussed that medical Tx yields around 50% mortality for aortic stenosis in 2 years if left untreated, and this is an option.    I told him may need a pacemaker given recent Mobitz I and II during recent hospitalization.     Valve: 29mm Sapien 3 (Area 523mm2 ) Bailout: Yes Access:  Transfemoral NYHA:II    Misc/procedural: Perhaps Left leg for access, better vessels.   The patient was counseled at length regarding treatment alternatives for management of severe symptomatic aortic stenosis. The risks and benefits of surgical intervention has been discussed in detail. Long-term prognosis with medical therapy was discussed. Alternative approaches such as conventional surgical aortic valve replacement, transcatheter aortic valve replacement, and palliative medical therapy were compared and contrasted at length.  This discussion was placed in the context of the patient's own specific clinical presentation and past medical history. All of her questions have been addressed.    Waverly Ferrari Jenya Putz

## 2023-02-21 NOTE — Progress Notes (Signed)
  [facility]   MULTIDISCIPLINARY HEART VALVE TEAM  Patient doing well s/p TAVR. He is hemodynamically stable. Having some post op confusion/disorientation however quickly reorients. Groin sites stable. Telemetry review with no pacing. Remains in rate controlled AF. Plan to start PO Eliquis this evening. Issues with urinary retention. In and out cath with residual. Ok to use foley catheter if needed. Will place order.   Georgie Chard NP-C Structural Heart Team  Pager: [phone] Phone: [phone]

## 2023-02-21 NOTE — Progress Notes (Signed)
Primary nurse Mickie Kay, RN needed assistance placing foley catheter on patient. As RN was prepping patient for placement One white oblong pill with M523/10-325 was found in the patient's bed in his gown. Patient stated " My daughter gave it to me just incase I was having too much pain and needed it." Patient educated on the importance of only taking medications within the hospital that have been prescribed. Patient educated that taking additional medications with scheduled medications in the hospital could have adverse effects. Single pill was identified by Pharmacist Sherron Monday and medication was visualized being wasted into the stericycle.   Mickie Kay, RN stayed at bedside during this time and was able to visualize patient at all times. Returned to room to assist with catheter insertion and asked patient, "Are there any other medications in your room that we need to be aware of?" Patient said, "No." I asked patient if it was okay if I searched through his patient belong bags to make sure that all medications from home had been sent down to pharmacy. Patient stated, "That's fine. I sent the rest of my medications down to pharmacy." Searched patient belonging bags and no additional medications identified. Searched patient home CPAP machine and found a sock that had pills in it. Patient was told that pills had been found in his CPAP machine and he responded by saying, "Okay I am going to tell you the truth..I have really bad pain issues that have been overlooked during prior hospitalizations.I just wanted to keep some extra pills just incase I needed them." Continued education with the patient that he had pain medication ordered that would be given within order parameters as the patient needed them and that we could reach out to the provider if he did not feel like his pain was adequately being managed. Patient appeared apprehensive. RN stated that we would secure his medications in a safe place in pharmacy  and at the same time patient grabbed sock filled with medication out of nurses hand. 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 I have been taking them for years. I can't just stop taking them." RN continued to remind patient that we will be frequently assessing his pain and that pain medications are ordered. RN told patient that we can't keep narcotics unsecured in a hospital for the safety of him and others. Patient gave RN all but two pills from the sock. Patient stated, "You can have all of those but I can't give you the last two." Patient continued to vocalize that he was worried nurses would give him less than the larger dose, forget to give it to him, or refuse to give it to him when he needed it. RN continued to build a trusting relationship with patient and help the patient understand that we want him to be comfortable and we don't want him to be in pain either. Patient was supported and encouraged. Patient gave last two pills to RN.  All pills were taken and placed in a bag. Medication was verified again with pharmacy by Calton Dach. Pills were counted. There were 21 white oblong pills labeled M523/10-325. One of these pills was already split in half and both halves were present. Count completed by RN and pharmacist and Pharmacy secure med sheet logged and medication taken to main pharmacy for storage. Copy of sheet placed in patient's chart.

## 2023-02-21 NOTE — Anesthesia Procedure Notes (Signed)
Procedure Name: Intubation Date/Time: 02/21/2023 9:28 AM  Performed by: Audie Pinto, CRNAPre-anesthesia Checklist: Patient identified, Emergency Drugs available, Suction available and Patient being monitored Patient Re-evaluated:Patient Re-evaluated prior to induction Oxygen Delivery Method: Circle system utilized Preoxygenation: Pre-oxygenation with 100% oxygen Induction Type: IV induction Ventilation: Mask ventilation without difficulty Laryngoscope Size: Mac and 4 Grade View: Grade I Tube type: Oral Tube size: 7.5 mm Number of attempts: 1 Airway Equipment and Method: Stylet and Oral airway Placement Confirmation: ETT inserted through vocal cords under direct vision, positive ETCO2 and breath sounds checked- equal and bilateral Secured at: 23 cm Tube secured with: Tape Dental Injury: Teeth and Oropharynx as per pre-operative assessment

## 2023-02-22 ENCOUNTER — Inpatient Hospital Stay (HOSPITAL_COMMUNITY): Payer: BC Managed Care – PPO

## 2023-02-22 DIAGNOSIS — Z952 Presence of prosthetic heart valve: Secondary | ICD-10-CM

## 2023-02-22 DIAGNOSIS — I350 Nonrheumatic aortic (valve) stenosis: Secondary | ICD-10-CM | POA: Diagnosis not present

## 2023-02-22 DIAGNOSIS — Z006 Encounter for examination for normal comparison and control in clinical research program: Secondary | ICD-10-CM | POA: Diagnosis not present

## 2023-02-22 DIAGNOSIS — Z1152 Encounter for screening for COVID-19: Secondary | ICD-10-CM | POA: Diagnosis not present

## 2023-02-22 DIAGNOSIS — R339 Retention of urine, unspecified: Secondary | ICD-10-CM | POA: Insufficient documentation

## 2023-02-22 DIAGNOSIS — I5033 Acute on chronic diastolic (congestive) heart failure: Secondary | ICD-10-CM | POA: Diagnosis not present

## 2023-02-22 LAB — CBC
HCT: 36.9 % — ABNORMAL LOW (ref 39.0–52.0)
Hemoglobin: 11 g/dL — ABNORMAL LOW (ref 13.0–17.0)
MCH: 25.2 pg — ABNORMAL LOW (ref 26.0–34.0)
MCHC: 29.8 g/dL — ABNORMAL LOW (ref 30.0–36.0)
MCV: 84.6 fL (ref 80.0–100.0)
Platelets: 405 10*3/uL — ABNORMAL HIGH (ref 150–400)
RBC: 4.36 MIL/uL (ref 4.22–5.81)
RDW: 20 % — ABNORMAL HIGH (ref 11.5–15.5)
WBC: 15.2 10*3/uL — ABNORMAL HIGH (ref 4.0–10.5)
nRBC: 0 % (ref 0.0–0.2)

## 2023-02-22 LAB — BASIC METABOLIC PANEL
Anion gap: 10 (ref 5–15)
BUN: 44 mg/dL — ABNORMAL HIGH (ref 8–23)
CO2: 25 mmol/L (ref 22–32)
Calcium: 9.1 mg/dL (ref 8.9–10.3)
Chloride: 103 mmol/L (ref 98–111)
Creatinine, Ser: 1.46 mg/dL — ABNORMAL HIGH (ref 0.61–1.24)
GFR, Estimated: 50 mL/min — ABNORMAL LOW (ref >60)
Glucose, Bld: 248 mg/dL — ABNORMAL HIGH (ref 70–99)
Potassium: 4.1 mmol/L (ref 3.5–5.1)
Sodium: 138 mmol/L (ref 135–145)

## 2023-02-22 LAB — ECHOCARDIOGRAM COMPLETE
AR max vel: 3.76 cm2
AV Area VTI: 3.74 cm2
AV Area mean vel: 3.75 cm2
AV Mean grad: 3.5 mmHg
AV Peak grad: 6.6 mmHg
Ao pk vel: 1.28 m/s
Area-P 1/2: 4.06 cm2
Calc EF: 53.4 %
Height: 74 in
MV M vel: 4.46 m/s
MV Peak grad: 79.6 mmHg
MV VTI: 2.18 cm2
Radius: 0.8 cm
S' Lateral: 3.7 cm
Single Plane A2C EF: 54.8 %
Single Plane A4C EF: 52.6 %
Weight: 3485.03 oz

## 2023-02-22 LAB — MAGNESIUM: Magnesium: 2.1 mg/dL (ref 1.7–2.4)

## 2023-02-22 LAB — GLUCOSE, CAPILLARY
Glucose-Capillary: 260 mg/dL — ABNORMAL HIGH (ref 70–99)
Glucose-Capillary: 257 mg/dL — ABNORMAL HIGH (ref 70–99)

## 2023-02-22 MED ORDER — HYDROXYZINE HCL 25 MG PO TABS
25.0000 mg | ORAL_TABLET | Freq: Three times a day (TID) | ORAL | Status: DC | PRN
  Administered 2023-02-22: 25 mg via ORAL
  Filled 2023-02-22 (×2): qty 1

## 2023-02-22 MED ORDER — HYDRALAZINE HCL 20 MG/ML IJ SOLN
5.0000 mg | Freq: Four times a day (QID) | INTRAMUSCULAR | Status: DC | PRN
  Administered 2023-02-22: 5 mg via INTRAVENOUS
  Filled 2023-02-22: qty 1

## 2023-02-22 MED ORDER — TORSEMIDE 40 MG PO TABS: 40.0000 mg | ORAL_TABLET | Freq: Every day | ORAL | 2 refills | Status: DC

## 2023-02-22 MED ORDER — APIXABAN 5 MG PO TABS: 5.0000 mg | ORAL_TABLET | Freq: Two times a day (BID) | ORAL | 2 refills | Status: DC

## 2023-02-22 MED FILL — Midazolam HCl Inj 2 MG/2ML (Base Equivalent): INTRAMUSCULAR | Qty: 1 | Status: AC

## 2023-02-22 NOTE — TOC Initial Note (Addendum)
Transition of Care [facility]) - Initial/Assessment Note    Patient Details  Name: Dylan Bruce MRN: [id number] Date of Birth: [birth year]  Transition [facility] CM/SW Contact:    Elliot Cousin, RN Phone Number: [phone] 02/22/2023, 12:00 PM  Clinical Narrative:                 CM spoke to pt at bedside. States he is independent PTA. States he drives to his appts. Pt states he has upcoming appt with PCP, Dr Mikey Bussing. 4/25 at 3 pm  Expected Discharge Plan: Home/Self Care Barriers to Discharge: No Barriers Identified   Patient Goals and CMS Choice Patient states their goals for this hospitalization and ongoing recovery are:: wants to remain independent     Expected Discharge Plan and Services   Discharge Planning Services: CM Consult   Living arrangements for the past 2 months: Single Family Home                   Prior Living Arrangements/Services Living arrangements for the past 2 months: Single Family Home Lives with:: Spouse Patient language and need for interpreter reviewed:: Yes Do you feel safe going back to the place where you live?: Yes      Need for Family Participation in Patient Care: No (Comment) Care giver support system in place?: Yes (comment) Current home services: DME (CPAP) Criminal Activity/Legal Involvement Pertinent to Current Situation/Hospitalization: No - Comment as needed  Activities of Daily Living Home Assistive Devices/Equipment: Eyeglasses, CBG Meter ADL Screening (condition at time of admission) Patient's cognitive ability adequate to safely complete daily activities?: Yes Is the patient deaf or have difficulty hearing?: No Does the patient have difficulty seeing, even when wearing glasses/contacts?: No Does the patient have difficulty concentrating, remembering, or making decisions?: No Patient able to express need for assistance with ADLs?: Yes Does the patient have difficulty dressing or bathing?: No Independently performs  ADLs?: Yes (appropriate for developmental age) Does the patient have difficulty walking or climbing stairs?: No Weakness of Legs: None Weakness of Arms/Hands: None  Permission Sought/Granted Permission sought to share information with : Case Manager, Family Supports, PCP Permission granted to share information with : Yes, Verbal Permission Granted  Share Information with NAME: Truong Delcastillo     Permission granted to share info w Relationship: wife  Permission granted to share info w Contact Information: [phone]  Emotional Assessment Appearance:: Appears stated age Attitude/Demeanor/Rapport: Engaged Affect (typically observed): Accepting Orientation: : Oriented to Self, Oriented to Place, Oriented to  Time, Oriented to Situation   Psych Involvement: No (comment)  Admission diagnosis:  Acute CHF (congestive heart failure) [I50.9] Acute congestive heart failure, unspecified heart failure type [I50.9] Patient Active Problem List   Diagnosis Date Noted   Urinary retention 02/22/2023   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   1st degree AV block 02/21/2023   PAF (paroxysmal atrial fibrillation) 02/21/2023   Acute CHF (congestive heart failure) 02/19/2023   Elevated troponin 01/11/2023   Acute on chronic systolic heart failure 01/11/2023   Overweight (BMI 25.0-29.9) 01/08/2023   NSTEMI (non-ST elevated myocardial infarction) 01/07/2023   Acute on chronic diastolic CHF (congestive heart failure) 01/07/2023   Diabetic neuropathy 01/07/2023   Chronic kidney disease, stage 3b 01/07/2023   Chronic gout of foot 09/15/2020   Anxiety 06/20/2018   Chest pain 06/17/2018   Anemia, unspecified 01/31/2018   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 06/05/2017   Depression 01/14/2014   Essential hypertension  09/10/2013   Chronic back pain 05/07/2013   Bradycardia 08/14/2012   Severe aortic stenosis 08/14/2012   CAD (coronary artery disease) of artery  bypass graft 07/17/2012   Type 2 diabetes mellitus with hyperlipidemia 09/03/2009   Hyperlipidemia 09/03/2009   Asthma 09/03/2009   GERD 09/03/2009   PSORIASIS 09/03/2009   ARTHRITIS 09/03/2009   SPINA BIFIDA 09/03/2009   Sleep apnea 09/03/2009   PCP:  Lindwood Qua, MD Pharmacy:   [facility] - [place], [place] - [place] - [address] [place] [address] Phone: [phone] Fax: [phone]  [facility] - [place], [place] - [address] [place] [address] Phone: [phone] Fax: [phone]  [facility] - [place], [place] - [address] [place] [address] Phone: [phone] Fax: [phone]     Social Determinants of Health (SDOH) Social History: SDOH Screenings   Food Insecurity: No Food Insecurity (02/20/2023)  Housing: Low Risk  (02/20/2023)  Transportation Needs: No Transportation Needs (02/20/2023)  Utilities: Not At Risk (02/20/2023)  Tobacco Use: Low Risk  (02/21/2023)   SDOH Interventions:     Readmission Risk Interventions     No data to display

## 2023-02-22 NOTE — Telephone Encounter (Signed)
Returned call to patients son- advised him that we have no received any forms here at the [facility] office. MD box and folder was checked no paper work here for patients son. Patients son states that he does not have any extra copies of forms. Advised patient I would send message back to [facility] st office to see if forms can be re-faxed over. Patients son verbalized understanding.

## 2023-02-22 NOTE — Inpatient Diabetes Management (Signed)
Inpatient Diabetes Program Recommendations  AACE/ADA: New Consensus Statement on Inpatient Glycemic Control (2015)  Target Ranges:  Prepandial:   less than 140 mg/dL      Peak postprandial:   less than 180 mg/dL (1-2 hours)      Critically ill patients:  140 - 180 mg/dL   Lab Results  Component Value Date   GLUCAP 260 (H) 02/22/2023   HGBA1C 8.5 (H) 02/20/2023    Review of Glycemic Control  Latest Reference Range & Units 02/21/23 14:07 02/21/23 18:27 02/21/23 21:38 02/22/23 06:50  Glucose-Capillary 70 - 99 mg/dL 643 (H) 329 (H) 518 (H) 260 (H)  (H): Data is abnormally high Diabetes history: Type 2 DM Outpatient Diabetes medications: Lantus 22 units QHS, Humalog 18-24 units TID, Jardiance 10 mg QD, Metformin 1000 mg BID Current orders for Inpatient glycemic control: Novolog 0-15 units TID & HS  Inpatient Diabetes Program Recommendations:    Consider adding Novolog 4 units TID (assuming patient consuming >50% of meals) and adding Semglee 12 units QD.   Thanks, Lujean Rave, MSN, RNC-OB Diabetes Coordinator [phone] (8a-5p)

## 2023-02-22 NOTE — Telephone Encounter (Signed)
Son's is calling in bout the forms. Please advise

## 2023-02-22 NOTE — Progress Notes (Signed)
CARDIAC REHAB PHASE I   PRE:  Rate/Rhythm: 71 SR  BP:  Sitting: 132/74      SaO2: 100 RA  MODE:  Ambulation: 100 ft   POST:  Rate/Rhythm: 78 SR  BP:  Sitting: 142/73      SaO2: 96 RA  Pt ambulated with assistance using front wheel walker. Tolerated well with no SOB, pain or dizziness. Returned to chair with bedside table and call bell in reach. Post TAVR education including site care, restrictions,risk factors, heart healthy diabetic diet, exercise guidelines and CRP2 reviewed. All questions and concerns addressed. Pt is not interested in CRP2 referral at this time. Plan for home later today.   [id number]  Woodroe Chen, RN BSN 02/22/2023 2:12 PM

## 2023-02-23 ENCOUNTER — Telehealth: Payer: Self-pay

## 2023-02-23 NOTE — Telephone Encounter (Signed)
Patient contacted regarding discharge from [facility] on 02/22/2023.  Patient understands to follow up with provider Mila Merry on 02/26/2023 at 9:15 AM at [facility]. Patient understands discharge instructions? yes Patient understands medications and regiment? yes Patient understands to bring all medications to this visit? Yes  The pt went to [facility] ER last night for evaluation due to hematuria.  The pt did have a foley catheter while hospitalized and was started on DOAC.  Today the pt's urine is back to normal and hematuria has resolved.  While on the phone the pt is very upset at the ER doctor for wanting to work up his confusion which he has a history of for the past 5 years. I reviewed CT and no acute findings.

## 2023-02-26 ENCOUNTER — Ambulatory Visit (INDEPENDENT_AMBULATORY_CARE_PROVIDER_SITE_OTHER): Payer: BC Managed Care – PPO | Admitting: Student

## 2023-02-26 ENCOUNTER — Encounter: Payer: Self-pay | Admitting: Student

## 2023-02-26 VITALS — BP 130/62 | HR 60 | Ht 74.0 in | Wt 227.2 lb

## 2023-02-26 DIAGNOSIS — I2510 Atherosclerotic heart disease of native coronary artery without angina pectoris: Secondary | ICD-10-CM

## 2023-02-26 DIAGNOSIS — Z794 Long term (current) use of insulin: Secondary | ICD-10-CM

## 2023-02-26 DIAGNOSIS — Z952 Presence of prosthetic heart valve: Secondary | ICD-10-CM

## 2023-02-26 DIAGNOSIS — I350 Nonrheumatic aortic (valve) stenosis: Secondary | ICD-10-CM

## 2023-02-26 DIAGNOSIS — I10 Essential (primary) hypertension: Secondary | ICD-10-CM

## 2023-02-26 DIAGNOSIS — R319 Hematuria, unspecified: Secondary | ICD-10-CM

## 2023-02-26 DIAGNOSIS — N289 Disorder of kidney and ureter, unspecified: Secondary | ICD-10-CM

## 2023-02-26 DIAGNOSIS — E118 Type 2 diabetes mellitus with unspecified complications: Secondary | ICD-10-CM

## 2023-02-26 DIAGNOSIS — I4892 Unspecified atrial flutter: Secondary | ICD-10-CM | POA: Diagnosis not present

## 2023-02-26 DIAGNOSIS — Z951 Presence of aortocoronary bypass graft: Secondary | ICD-10-CM | POA: Diagnosis not present

## 2023-02-26 DIAGNOSIS — E041 Nontoxic single thyroid nodule: Secondary | ICD-10-CM

## 2023-02-26 DIAGNOSIS — I441 Atrioventricular block, second degree: Secondary | ICD-10-CM

## 2023-02-26 DIAGNOSIS — I739 Peripheral vascular disease, unspecified: Secondary | ICD-10-CM

## 2023-02-26 DIAGNOSIS — I5032 Chronic diastolic (congestive) heart failure: Secondary | ICD-10-CM

## 2023-02-26 DIAGNOSIS — N183 Chronic kidney disease, stage 3 unspecified: Secondary | ICD-10-CM

## 2023-02-26 DIAGNOSIS — R911 Solitary pulmonary nodule: Secondary | ICD-10-CM

## 2023-02-26 DIAGNOSIS — E785 Hyperlipidemia, unspecified: Secondary | ICD-10-CM

## 2023-02-26 MED ORDER — AMOXICILLIN 500 MG PO TABS: ORAL_TABLET | ORAL | 3 refills | Status: DC

## 2023-02-26 NOTE — Patient Instructions (Addendum)
Medication Instructions:   TAKE Amoxicillin 2000 mg (4-500 mg tablets) 1 hour prior to dental procedures   *If you need a refill on your cardiac medications before your next appointment, please call your pharmacy*  Lab Work: Marjie Skiff, PA-C recommends that you have lab work TODAY:  BMP CBC  If you have labs (blood work) drawn today and your tests are completely normal, you will receive your results only by: MyChart Message (if you have MyChart) OR A paper copy in the mail If you have any lab test that is abnormal or we need to change your treatment, we will call you to review the results.  Testing/Procedures: NONE ordered at this time of appointment   Follow-Up: At [facility], you and your health needs are our priority.  As part of our continuing mission to provide you with exceptional heart care, we have created designated Provider Care Teams.  These Care Teams include your primary Cardiologist (physician) and Advanced Practice Providers (APPs -  Physician Assistants and Nurse Practitioners) who all work together to provide you with the care you need, when you need it.   Your next appointment:   6 month(s)  Provider:   Maisie Fus, MD  or Marjie Skiff, PA-C        Other Instructions From recent CT scan please discuss with PCP about need for a Thyroid Ultrasound and MRI Nonvascular:  1. Left-sided thyroid nodule measuring 2.5 cm. Recommend thyroid ultrasound further evaluation. 2. Indeterminate lesion of the anterior left kidney is increased in size when compared with prior exam. Recommend renal protocol MRI for further evaluation. 3. Bilateral mosaic attenuation, likely due to air trapping which can be seen in the setting of small airways disease. 4. Scattered small solid pulmonary nodules measuring up to 4 mm. No follow-up needed if patient is low-risk. Non-contrast chest CT can be considered in 12 months if patient is high-risk. This recommendation  follows the consensus statement: Guidelines for Management of Incidental Pulmonary Nodules Detected on CT Images: From the Fleischner Society 2017; Radiology 2017; 284:228-243.

## 2023-02-27 LAB — CBC
Hematocrit: 36 % — ABNORMAL LOW (ref 37.5–51.0)
Hemoglobin: 11 g/dL — ABNORMAL LOW (ref 13.0–17.7)
MCH: 26.1 pg — ABNORMAL LOW (ref 26.6–33.0)
MCHC: 30.6 g/dL — ABNORMAL LOW (ref 31.5–35.7)
MCV: 85 fL (ref 79–97)
Platelets: 315 10*3/uL (ref 150–450)
RBC: 4.22 x10E6/uL (ref 4.14–5.80)
RDW: 17 % — ABNORMAL HIGH (ref 11.6–15.4)
WBC: 9.6 10*3/uL (ref 3.4–10.8)

## 2023-02-27 LAB — BASIC METABOLIC PANEL
BUN/Creatinine Ratio: 26 — ABNORMAL HIGH (ref 10–24)
BUN: 39 mg/dL — ABNORMAL HIGH (ref 8–27)
CO2: 24 mmol/L (ref 20–29)
Calcium: 9.3 mg/dL (ref 8.6–10.2)
Chloride: 101 mmol/L (ref 96–106)
Creatinine, Ser: 1.52 mg/dL — ABNORMAL HIGH (ref 0.76–1.27)
Glucose: 400 mg/dL — ABNORMAL HIGH (ref 70–99)
Potassium: 4 mmol/L (ref 3.5–5.2)
Sodium: 141 mmol/L (ref 134–144)
eGFR: 47 mL/min/{1.73_m2} — ABNORMAL LOW (ref >59)

## 2023-02-27 NOTE — Telephone Encounter (Signed)
Message to front desk/registration to see if a copy was kept there.

## 2023-03-01 NOTE — Telephone Encounter (Signed)
Confirmed forms received by [facility] RN.

## 2023-03-02 ENCOUNTER — Ambulatory Visit: Payer: BC Managed Care – PPO

## 2023-03-02 ENCOUNTER — Telehealth: Payer: Self-pay | Admitting: Student

## 2023-03-02 NOTE — Telephone Encounter (Signed)
Pt called requesting a callback in regards to some fluid problems and wants to let Marjie Skiff, PA know that his DOXYCYCLINE he takes is 20mg . Pt stated he didn't know when he was in the office so he called to provide info. Please advise

## 2023-03-02 NOTE — Telephone Encounter (Addendum)
Pt stated he has gained 7 lb this week. He mentioned mild swelling LLE and RLE, he does wear compression stockings. He is also c/o mild wheezing and chest congestion, does take Mucinex, however, did not take the medication this morning. Patient did have TAVR on 4/17 and prior to surgery he had parameters  with  torsemide. Since then he parameters were discontinued.  Patient will like to know if he can take additional dose of torsemide. Spoke to the DOD, patient was advised to take additional torsemide today and if this becomes more frequent please make an appointment with our office for further evaluation. Patient voiced understanding.    Daily weights  4/26 228 lb 4/25 226 lb 4/24 224 lb 4/23 222 lb 4/22 222 lb

## 2023-03-06 ENCOUNTER — Telehealth: Payer: Self-pay | Admitting: Cardiology

## 2023-03-06 NOTE — Telephone Encounter (Signed)
Patient is calling to speak to the nurse. Please advise

## 2023-03-06 NOTE — Telephone Encounter (Signed)
Returned pt's call, no answer, left message and call back number.

## 2023-03-06 NOTE — Telephone Encounter (Signed)
Attempted to call patient's son to inquire about dates needed for FMLA, and frequency etc needed for forms. Left message to call back.

## 2023-03-06 NOTE — Telephone Encounter (Signed)
Pt's son calling back  about his FMLA paperwork- so he can help with his father's care. He reports that the paperwork was lost, but he recently received a call that it was found last week- last Friday 03/02/23.    FMLA paperwork was found and sent to Dr Verna Czech office; was told that the primary Cardiologist has to fill out this paperwork. He said it is time sensitive (it was "lost" for several weeks and needs to be sent asap)  I told him that we would be on the lookout for this paperwork, but it would be a good idea to bring it to us/or fax to [place] as well.   He states that he will bring a copy up to the office- to Dr Wyline Mood as well. (We will look to see if we have it, if we do, we will let him know).

## 2023-03-06 NOTE — Telephone Encounter (Signed)
Pt returning call

## 2023-03-07 NOTE — Telephone Encounter (Signed)
Returned call to patients son- got needed information on form. Patients son just needed intermittent FMLA for the days from 01/07/2023- 02/26/23 while he provided care for the patient. Forms filled out and signed by Dr. Wyline Mood. Given back to front desk to be processed and faxed. Patients son does request copy of forms.

## 2023-03-07 NOTE — Telephone Encounter (Signed)
Patient's son is returning call and is requesting return call.

## 2023-03-12 ENCOUNTER — Telehealth: Payer: Self-pay | Admitting: Cardiology

## 2023-03-12 NOTE — Telephone Encounter (Signed)
Pt's son is requesting a callback from [place] in regards to [facility] paperwork he spoke with her about last week. Please advise

## 2023-03-12 NOTE — Telephone Encounter (Signed)
Addendum: Verified with Lowella Bandy Cocklin that the paperwork will be faxed today

## 2023-03-12 NOTE — Telephone Encounter (Signed)
He called the company this morning and they still have not received a fax of  the FMLA.   Verified that this paperwork will be faxed today. Gave this information to Madelaine Bhat, he verbalized understanding.

## 2023-03-13 ENCOUNTER — Telehealth: Payer: Self-pay | Admitting: Internal Medicine

## 2023-03-13 NOTE — Telephone Encounter (Signed)
FMLA forms faxed to [employer] 03/12/23 @ 12:06pm. Spoke to son on 5/7@ 2:21pm to let him know they were faxed. He requested to pick up the forms. Paperwork placed in envelope and put in pick up bin. Payment processed 5/7.

## 2023-03-15 ENCOUNTER — Telehealth: Payer: Self-pay | Admitting: Student

## 2023-03-15 NOTE — Telephone Encounter (Signed)
Patient doctor note is from 02/26/23. HR states He missed work starting 02/14/21. She  needs to verify the note can be for back to this date for medical disability.  Fax to [phone] He does not qualify for FMLA so they need a note.

## 2023-03-15 NOTE — Telephone Encounter (Signed)
Lauren-from [employer]- HR is calling to clarify some notes on patients OV from 02/26/2023.

## 2023-03-16 ENCOUNTER — Telehealth: Payer: Self-pay | Admitting: Internal Medicine

## 2023-03-16 NOTE — Telephone Encounter (Signed)
Son is calling regarding the FMLA forms submitted for him. He reports his request was denied due to two questions on the forms not being answered. He is requesting a callback to discuss. Please advise.

## 2023-03-16 NOTE — Telephone Encounter (Signed)
Son states that received denial  for question 6 and 7 were not filled out.  Looked at paperwork and reviewed the questions were answered and the answer given.  Advised that the denial may be based on the response but not absence of response.  He states understanding.  Nothing further needed.

## 2023-03-16 NOTE — Telephone Encounter (Signed)
Looks like he was admitted on 02/19/2023 so OK for note to cover from 02/19/2023 to 03/05/2023 which is when I told him he could go back to work at my visit with him.  Thanks so much!

## 2023-03-19 ENCOUNTER — Telehealth: Payer: Self-pay | Admitting: Internal Medicine

## 2023-03-19 NOTE — Telephone Encounter (Signed)
Caller wants to get clarification on patient's doctor's note.  Confirmation for absences 4/11 through 4/22.

## 2023-03-19 NOTE — Telephone Encounter (Signed)
Confirmation for absences 4/11 through 4/22. Spoke with HR in relation that he was having difficulties on the 10th and spoke with our office.  Discussion ith patient again on the 11th and then hospitalization on the 15th through 18th.  He was then scheduled with [facility] for the 22nd.  Based on information she fells enough information to clear patient from work dating back to April 11 through the 22nd. Nothing further needed

## 2023-03-20 NOTE — Progress Notes (Signed)
Looks like pt was admitted on 02/19/2023 so OK for note to cover from 02/19/2023 to 03/05/2023 which is when I told him he could go back to work at my visit with him. Marjie Skiff, PA-C 03/16/2023 12:53 AM see telephone note dated 03/15/2023 10:29 AM .

## 2023-03-20 NOTE — Telephone Encounter (Signed)
Returned call to pt notified that he was admitted on 02/19/2023 and we can write his return to work note to cover from 02/19/2023 to 03/05/2023. Pt verbalized understanding. Called pt's HR spoke with Jeronimo Norma, informed her of the dates that will be covered, from 02/19/2023 to 03/05/2023. See letter dated 03-20-23.

## 2023-03-30 NOTE — Progress Notes (Deleted)
[facility]   MULTIDISCIPLINARY HEART VALVE CLINIC                                     Cardiology Office Note:    Date:  03/30/2023   ID:  Dylan Bruce, DOB 10/09/1946, MRN [id number]  PCP:  Lindwood Qua, MD  [facility] Cardiologist:  Maisie Fus, MD /Dr. Clifton James, MD & Dr. Delia Chimes, MD (TAVR)  [facility] Electrophysiologist:  None   Referring MD: Lindwood Qua, MD   No chief complaint on file. ***  History of Present Illness:    Dylan Bruce is a 77 y.o. male with a hx of CAD s/p 4V CABG 2003 with PCI of SVG 2010, chronic diastolic CHF, HTN, hyperlipidemia, diabetes, sleep apnea, anxiety, depression, gout, PAD, neuropathy, psoriatic arthritis, chronic kidney disease stage III, GERD and severe aortic stenosis who presented to [facility] on 02/19/23 with acute CHF prior to TAVR scheduled for 02/21/23.    Dylan Bruce is now followed by Dr. Carolan Clines for his cardiology care. He was remotely followed by Dr. Jens Som and then [facility] from 2018 until recently.  He has a hx of PAD and has undergone several vascular procedures by a vascular surgeon in [place], [place]. His CAD dates back to 2003 when he underwent CABG x4 and subsequent PCI to the SVG to PDA in 2010. He was found to have moderate AS in 2018 by echocardiogram. He was recently admitted to [facility] 01/07/23 with dyspnea, orthopnea and LE edema. Echocardiogram 01/08/23 showed an LVEF at 55% with mild mitral regurgitation, and severe aortic stenosis with mean gradient 32 mmHg, AVA 0.65 cm2, DI 0.18, SVI 27. Cardiac cath 01/11/23 showed 2/4 patent bypass grafts (LIMA to LAD, SVG to OM) with collateral filling of the distal RCA. His CAD was felt to be stable with continued medical treatment. He was discharged from [facility] on 01/12/23 and was doing much better clinically with improvement in LE edema and dyspnea.    He was then seen in structural heart consultation 01/22/23 at which time TAVR was discussed. Plan was to proceed with pre TAVR imaging.  He was then evaluated by the multidisciplinary valve team and felt to have severe, symptomatic aortic stenosis and to be a suitable candidate for TAVR, which was set up for 02/21/23.    He then called the [facility] office 4/10 with weight gain and SOB. He reported doubling his Lasix to help with symptoms. By follow up call, he was feeling much better. He then presented to PAT and was found to be in new atrial flutter, felt to likely be driving his HF symptoms. He was then admitted for IV diuresis and AF control along with TAVR on 02/21/23.  S/p TAVR  Saw Callie in TOC follow up and was doing well with no concerns.    Acute on chronic HF: Likely multifactorial with new onset atrial flutter and severe aortic stenosis. The patient was admitted for IV diuresis prior to TAVR. LVEDP at . Patient on home torsemide 20mg . Appears euvolemic on exam today. Plan to increase torsemide to 40mg  QD and reassess at [facility] follow up next week. BMET at that time.    Severe AS: s/p successful TAVR with a 29 mm Edwards Sapien 3 THV via the TF approach on 02/21/23. Post operative echo pending. Groin sites are stable. ECG with AF and no high grade heart block. R IJ  temp wire left in place post TAVR with no pacing overnight. Wire removed and patient monitored on telemetry with no issues. Started on Eliquis given new AF at PAT and will continue Plavix given underlying vascular disease. Discussed with Dr. Clifton James. Post op instructions reviewed with understanding. Will require lifelong dental SBE; to be provided at [facility] follow up. He has ambulated with CRI with no issues.    CAD: s/p 4V CABG 2003 with PCI of SVG 2010: Denies anginal symptoms. Continue medical treatment.    HTN: Stable with no changes needed at this time.    HLD: Continue statin   DM2: Covered with SSI while inpatient and resumed on home regimen at discharge. Follow with PCP   CKD Stage III: Baseline Cr appears to be in the 1.5 range. Plan to follow with BMET at  appointment next week given increase in torsemide to 40mg  QD.    PAD: Previously taking ASA and Plavix for hx of vascular disease however given new AF, ASA was stopped and patient will remain on Eliquis and Plavix. Check CBC at [facility].    Urinary retention: Foley catheter placed due to residual urinary retention of post TAVR. Plan to d/c this now and monitor post removal. Will need to urinate prior to discharge.    Incidental findings: Left-sided thyroid nodule measuring 2.5 cm. Recommend thyroid ultrasound further evaluation. Indeterminate lesion of the anterior left kidney is increased in size when compared with prior exam. Recommend renal protocol MRI for further evaluation. Scattered small solid pulmonary nodules measuring up to 4 mm. No follow-up needed if patient is low-risk. Non-contrast chest CT can be considered in 12 months if patient is high-risk.    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 Clifton James and Dr. Delia Chimes   SLEEP APNEA    SPINA BIFIDA     Past Surgical History:  Procedure Laterality Date   BACK SURGERY     CARPAL TUNNEL RELEASE Left 08/21/2019   Procedure: CARPAL TUNNEL RELEASE;  Surgeon: Cindee Salt, MD;  Location: [facility];  Service: Orthopedics;  Laterality: Left;  AXILLARY BLOCK   CORONARY ARTERY BYPASS GRAFT     INTRAOPERATIVE TRANSTHORACIC ECHOCARDIOGRAM N/A 02/21/2023   Procedure: INTRAOPERATIVE TRANSTHORACIC ECHOCARDIOGRAM;  Surgeon: Kathleene Hazel, MD;  Location: [facility];  Service: Open Heart Surgery;  Laterality: N/A;   JOINT REPLACEMENT Left    shoulder   LEFT HEART CATH AND CORS/GRAFTS ANGIOGRAPHY N/A 06/18/2018    Procedure: LEFT HEART CATH AND CORS/GRAFTS ANGIOGRAPHY;  Surgeon: Runell Gess, MD;  Location: [facility] INVASIVE CV LAB;  Service: Cardiovascular;  Laterality: N/A;   RIGHT/LEFT HEART CATH AND CORONARY/GRAFT ANGIOGRAPHY N/A 01/11/2023   Procedure: RIGHT/LEFT HEART CATH AND CORONARY/GRAFT ANGIOGRAPHY;  Surgeon: Kathleene Hazel, MD;  Location: [facility]AS[facility];  Service: Cardiovascular;  Laterality: N/A;   TRANSCATHETER AORTIC VALVE REPLACEMENT, TRANSFEMORAL Left 02/21/2023   Procedure: Transcatheter Aortic Valve Replacement, Transfemoral;  Surgeon: Kathleene Hazel, MD;  Location: [facility];  Service: Open Heart Surgery;  Laterality: Left;   TRIGGER FINGER RELEASE Left  08/21/2019   Procedure: RELEASE TRIGGER LEFT SMALL FINGER LEFT INDEX;  Surgeon: Cindee Salt, MD;  Location: [facility];  Service: Orthopedics;  Laterality: Left;   ULNAR NERVE TRANSPOSITION Left 08/21/2019   Procedure: DECOMPRESSION WITH ULNAR NERVE LEFT CUBITAL TUNNEL ULNAR;  Surgeon: Cindee Salt, MD;  Location: [facility];  Service: Orthopedics;  Laterality: Left;    Current Medications: No outpatient medications have been marked as taking for the 04/04/23 encounter (Appointment) with [facility].     Allergies:   Cefuroxime, Ibuprofen, Iodinated contrast media, Cefpodoxime, Ciprofloxacin, Doxycycline, Sulfamethoxazole-trimethoprim, Fluocinolone, Probenecid, Allopurinol, Azithromycin, and Sulfa antibiotics   Social History   Socioeconomic History   Marital status: Married    Spouse name: Not on file   Number of children: 2   Years of education: Not on file   Highest education level: Not on file  Occupational History   Occupation: Retired-Owned a Administrator, sports  Tobacco Use   Smoking status: Never   Smokeless tobacco: Never  Substance and Sexual Activity   Alcohol use: Not Currently   Drug use: No   Sexual activity: Not Currently  Other Topics  Concern   Not on file  Social History Narrative   Not on file   Social Determinants of Health   Financial Resource Strain: Not on file  Food Insecurity: No Food Insecurity (02/20/2023)   Hunger Vital Sign    Worried About Running Out of Food in the Last Year: Never true    Ran Out of Food in the Last Year: Never true  Transportation Needs: No Transportation Needs (02/20/2023)   PRAPARE - Administrator, Civil Service (Medical): No    Lack of Transportation (Non-Medical): No  Physical Activity: Not on file  Stress: Not on file  Social Connections: Not on file     Family History: The patient's ***family history includes Prostate cancer in his father; Valvular heart disease in his father.  ROS:   Please see the history of present illness.    All other systems reviewed and are negative.  EKGs/Labs/Other Studies Reviewed:    The following studies were reviewed today:  [facility]  TAVR OPERATIVE NOTE     Date of Procedure:                02/21/2023   Preoperative Diagnosis:      Severe Aortic Stenosis    Postoperative Diagnosis:    Same    Procedure:        Transcatheter Aortic Valve Replacement - Transfemoral Approach             Edwards Sapien 3 THV (size 29 mm, model # [id number], serial # [id number] )              Co-Surgeons:                        Verne Carrow, MD and Clare Charon , MD    Anesthesiologist:                  Jean Rosenthal   Echocardiographer:              Croitoru   Pre-operative Echo Findings: Severe aortic stenosis Normal left ventricular systolic function   Post-operative Echo Findings: No paravalvular leak Normal left ventricular systolic function _____________  Echocardiogram 02/22/23:   1. Left ventricular ejection fraction, by estimation, is 55 to 60%. The  left ventricle has normal  function. The left ventricle demonstrates  regional wall motion abnormalities (see scoring diagram/findings for  description).  Left ventricular diastolic  function could not be evaluated. There is moderate hypokinesis of the left  ventricular, mid-apical anterolateral wall.   2. Right ventricular systolic function is normal. The right ventricular  size is normal.   3. Left atrial size was severely dilated.   4. The mitral valve is degenerative. Moderate mitral valve regurgitation.  Moderate mitral annular calcification.   5. The aortic valve has been repaired/replaced. Aortic valve  regurgitation is not visualized. There is a 29 mm Sapien prosthetic (TAVR)  valve present in the aortic position. Procedure Date: 02/21/2023. Aortic  valve mean gradient measures 3.5 mmHg. Aortic   valve Vmax measures 1.28 m/s. Aortic valve acceleration time measures 70  msec.   6. There is mild dilatation of the ascending aorta, measuring 42 mm.   Comparison(s): The left ventricular function is unchanged. The left  ventricular wall motion abnormality is unchanged.   EKG:  EKG is *** ordered today.  The ekg ordered today demonstrates ***  Recent Labs: 02/19/2023: ALT 48; B Natriuretic Peptide 331.6 02/20/2023: TSH 2.667 02/22/2023: Magnesium 2.1 02/26/2023: BUN 39; Creatinine, Ser 1.52; Hemoglobin 11.0; Platelets 315; Potassium 4.0; Sodium 141  Recent Lipid Panel    Component Value Date/Time   CHOL 91 01/10/2023 0216   TRIG 149 01/10/2023 0216   HDL 24 (L) 01/10/2023 0216   CHOLHDL 3.8 01/10/2023 0216   VLDL 30 01/10/2023 0216   LDLCALC 37 01/10/2023 0216     Risk Assessment/Calculations:   {Does this patient have ATRIAL FIBRILLATION?:[phone]}   Physical Exam:    VS:  There were no vitals taken for this visit.    Wt Readings from Last 3 Encounters:  02/26/23 227 lb 3.2 oz (103.1 kg)  02/22/23 217 lb 13 oz (98.8 kg)  02/08/23 229 lb (103.9 kg)     GEN: *** Well nourished, well developed in no acute distress HEENT: Normal NECK: No JVD; No carotid bruits LYMPHATICS: No lymphadenopathy CARDIAC: ***RRR, no  murmurs, rubs, gallops RESPIRATORY:  Clear to auscultation without rales, wheezing or rhonchi  ABDOMEN: Soft, non-tender, non-distended MUSCULOSKELETAL:  No edema; No deformity  SKIN: Warm and dry NEUROLOGIC:  Alert and oriented x 3 PSYCHIATRIC:  Normal affect   ASSESSMENT:    No diagnosis found. PLAN:    In order of problems listed above:       {Are you ordering a CV Procedure (e.g. stress test, cath, DCCV, TEE, etc)?   Press F2        :[id number]}    Medication Adjustments/Labs and Tests Ordered: Current medicines are reviewed at length with the patient today.  Concerns regarding medicines are outlined above.  No orders of the defined types were placed in this encounter.  No orders of the defined types were placed in this encounter.   There are no Patient Instructions on file for this visit.   Signed, Georgie Chard, NP  03/30/2023 10:23 AM    [facility]

## 2023-04-04 ENCOUNTER — Ambulatory Visit (HOSPITAL_COMMUNITY): Payer: BC Managed Care – PPO

## 2023-04-04 ENCOUNTER — Ambulatory Visit: Payer: BC Managed Care – PPO

## 2023-04-09 ENCOUNTER — Telehealth: Payer: Self-pay

## 2023-04-09 NOTE — Telephone Encounter (Signed)
Pt was scheduled for post TAVR follow up on 04/04/2023 and he had to cancel due to a fall. The pt is having sleep issues and is experiencing sleep walking. At this time the 75 day window ends on July 1st and this is the first opening available on the echo schedule.  I have arranged for the pt to have an Echo and OV on 7/1.    KCCQ completed over the phone.  The pt states that he feels better after undergoing TAVR and has noticed an improvement in his breathing and energy level.  He does not feel limited at this time with his activities, NYHA class I.

## 2023-04-18 ENCOUNTER — Telehealth: Payer: Self-pay | Admitting: Internal Medicine

## 2023-04-18 NOTE — Telephone Encounter (Signed)
FMLA forms put in providers box for completion.

## 2023-04-19 NOTE — Telephone Encounter (Signed)
Forms placed in Dr. Princess Perna folder- will forward to Covering RN

## 2023-04-23 ENCOUNTER — Telehealth: Payer: Self-pay

## 2023-04-23 NOTE — Telephone Encounter (Signed)
Called pt to let him know Dr. Wyline Mood can not fill out FMLA due to the fact she only seen the pt in the hospital. Spoke with pt and his daughter. They understanding the reasoning. The paperwork will be left at the front desk for the pt's son to pick up. No concerns expressed at this time.

## 2023-05-02 NOTE — Progress Notes (Unsigned)
[facility]   MULTIDISCIPLINARY HEART VALVE CLINIC                                     Cardiology Office Note:    Date:  05/06/2023   ID:  Dylan Bruce, DOB 1945/12/29, MRN [id number]  PCP:  Lindwood Qua, MD  [facility] Heart[facility] Cardiologist:  Maisie Fus, MD  [facility] Electrophysiologist:  None   Referring MD: Lindwood Qua, MD   1 month s/p TAVR  History of Present Illness:    Dylan Bruce is a 77 y.o. 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   Patient was remotely followed by Dr. Jens Som and then [facility]iology and now follows with Dr. Carolan Clines. He has a long history of CAD s/p remote CABG x4 with LIMA to LAD, SVG to OM, SVG to 1st Diag, and SVG to distal RCA  in 2003 and subsequent PCI with DES of SVG to PDA in 2010. He also has a history of PAD with self reported severe lower extremity disease bilaterally. He follows with Vascular Surgery in [place], [place] and reportedly has had multiple interventions (last one was about 1 year ago). Patient was recently admitted to [facility] in 01/2023 for NSTEMI and acute on chronic diastolic CHF. High-sensitivity troponin peaked at 11,393. Echo showed LVEF of 55% with hypokinesis of mid/distal lateral walls and mild LVH, normal RV, mild left atrial enlargement, mild MR, and severe aortic stenosis with mean gradient of 32 mmHg and AVA (VTI) of 0.8 cm^2. R/LHC showed 2/4 patent grafts (LIMA to LAD and SVG to OM) with known occlusion of SVG to distal RCA and SVG to 1st Diag. There were no focal targets for PCI and continued medical therapy was recommended for  CAD. He was aggressively diuresed with high dose IV Lasix and Metolazone and then transitioned to Torsemide. He was noted to have intermittent 2nd degree Mobitz type I and II during admission and beta-blocker was held. He was referred to Dr. Clifton James for his severe aortic stenosis and TAVR was recommended. This was scheduled for 02/21/2023. However, prior to he started having signs of decompensated CHF again with shortness of breath and weight gain as well as elevated Pro-BNP so he was admitted on 02/19/2023 for IV diuresis prior to procedure. He was also noted to be in new onset atrial flutter and was started on IV Heparin. He underwent successful TAVR with a 29 mm Edwards Sapien 3 THV on 02/21/2023. He was started on Eliquis given new atrial flutter after procedure and continued on Plavix given underlying vascular disease but Aspirin was stopped. Home Torsemide was increased at discharge.   Patient was seen in the [facility] ED on 02/23/2023 for further evaluation of hematuria. Urinalysis showed large blood, >30 RBC, 4 WBC, few bacteria, 100 mg/dL of protein, and 409 mg/dL of glucose.  Hematuria was felt to be due to irritation from Foley during recent admission. Head CT was also performed due to reports of a brief episode of confusion and was unremarkable. Patient reported this  was not new for him and he has had confusion for years. He was advised to follow-up with his PCP.  Last seen in our office on 02/26/23 with Dylan Skiff PA-C and doing well.  Today the patient presents to clinic for follow up. He has had a marked clinical improvement since TAVR with resolution of dyspnea on exertion and orthopnea since surgery. No more hematuria. Been working out in heat which makes him a little tired. Has some mild swelling in left leg. No CP or SOB. No dizziness or syncope. No blood in stool or urine. No palpitations.     Past Medical History:  Diagnosis Date   Aortic stenosis    ARTHRITIS    ASTHMA    CAD  (coronary artery disease)    CHF (congestive heart failure) (HCC)    DM    GERD    HYPERLIPIDEMIA    Hypertension    PSORIASIS    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 Clifton James and Dr. Delia Chimes   SLEEP APNEA    SPINA BIFIDA     Past Surgical History:  Procedure Laterality Date   BACK SURGERY     CARPAL TUNNEL RELEASE Left 08/21/2019   Procedure: CARPAL TUNNEL RELEASE;  Surgeon: Cindee Salt, MD;  Location: [facility];  Service: Orthopedics;  Laterality: Left;  AXILLARY BLOCK   CORONARY ARTERY BYPASS GRAFT     INTRAOPERATIVE TRANSTHORACIC ECHOCARDIOGRAM N/A 02/21/2023   Procedure: INTRAOPERATIVE TRANSTHORACIC ECHOCARDIOGRAM;  Surgeon: Kathleene Hazel, MD;  Location: [facility];  Service: Open Heart Surgery;  Laterality: N/A;   JOINT REPLACEMENT Left    shoulder   LEFT HEART CATH AND CORS/GRAFTS ANGIOGRAPHY N/A 06/18/2018   Procedure: LEFT HEART CATH AND CORS/GRAFTS ANGIOGRAPHY;  Surgeon: Runell Gess, MD;  Location: [facility];  Service: Cardiovascular;  Laterality: N/A;   RIGHT/LEFT HEART CATH AND CORONARY/GRAFT ANGIOGRAPHY N/A 01/11/2023   Procedure: RIGHT/LEFT HEART CATH AND CORONARY/GRAFT ANGIOGRAPHY;  Surgeon: Kathleene Hazel, MD;  Location: [facility];  Service: Cardiovascular;  Laterality: N/A;   TRANSCATHETER AORTIC VALVE REPLACEMENT, TRANSFEMORAL Left 02/21/2023   Procedure: Transcatheter Aortic Valve Replacement, Transfemoral;  Surgeon: Kathleene Hazel, MD;  Location: [facility];  Service: Open Heart Surgery;  Laterality: Left;   TRIGGER FINGER RELEASE Left 08/21/2019   Procedure: RELEASE TRIGGER LEFT SMALL FINGER LEFT INDEX;  Surgeon: Cindee Salt, MD;  Location: [facility];  Service: Orthopedics;  Laterality: Left;   ULNAR NERVE TRANSPOSITION Left 08/21/2019   Procedure: DECOMPRESSION WITH ULNAR NERVE LEFT CUBITAL TUNNEL ULNAR;   Surgeon: Cindee Salt, MD;  Location:  SURGERY CENTER;  Service: Orthopedics;  Laterality: Left;    Current Medications: Current Meds  Medication Sig   albuterol (PROVENTIL HFA;VENTOLIN HFA) 108 (90 Base) MCG/ACT inhaler Inhale 2 puffs into the lungs every 4 (four) hours as needed for wheezing.   amoxicillin (AMOXIL) 500 MG tablet Prophylaxis for dental procedure. Take 2 grams (4x 500mg  tablets) 1 hour before procedure.   apixaban (ELIQUIS) 5 MG TABS tablet Take 1 tablet (5 mg total) by mouth 2 (two) times daily.   atorvastatin (LIPITOR) 80 MG tablet Take 1 tablet (80 mg total) by mouth daily.   budesonide-formoterol (SYMBICORT) 160-4.5 MCG/ACT inhaler Inhale 2 puffs into the lungs in the morning and at bedtime.   clobetasol cream (TEMOVATE) 0.05 % Apply 1 application topically daily as needed (rash).  clopidogrel (PLAVIX) 75 MG tablet Take 1 tablet (75 mg total) by mouth daily.   COSENTYX SENSOREADY, 300 MG, 150 MG/ML SOAJ Inject 300 mg into the skin every 30 (thirty) days.   cyclobenzaprine (FLEXERIL) 10 MG tablet Take 10 mg by mouth at bedtime.    desonide (DESOWEN) 0.05 % cream Apply 1 application topically daily as needed (rash).    docusate sodium (COLACE) 100 MG capsule Take 100 mg by mouth daily.    DOXYCYCLINE CALCIUM PO Take by mouth in the morning and at bedtime.   esomeprazole (NEXIUM) 40 MG capsule Take 40 mg by mouth daily before breakfast.   ezetimibe (ZETIA) 10 MG tablet Take 10 mg by mouth daily.   fluticasone (FLONASE) 50 MCG/ACT nasal spray Place 1 spray into both nostrils in the morning and at bedtime.   hydrocortisone (ANUCORT-HC) 25 MG suppository Place 1 suppository rectally 2 (two) times daily as needed for hemorrhoids.   hydrOXYzine (VISTARIL) 25 MG capsule Take 25 mg by mouth daily as needed for itching (sleep).   insulin lispro (HUMALOG KWIKPEN) 100 UNIT/ML KiwkPen Inject 18-24 Units into the skin 3 (three) times daily with meals.   isosorbide mononitrate  (IMDUR) 30 MG 24 hr tablet Take 1 tablet (30 mg total) by mouth daily. Please schedule appointment for refills.   JARDIANCE 10 MG TABS tablet Take 10 mg by mouth daily.   LANTUS SOLOSTAR 100 UNIT/ML Solostar Pen Inject 22 Units into the skin at bedtime.   LYRICA 100 MG capsule Take 1 capsule (100 mg total) by mouth 2 (two) times daily.   metFORMIN (GLUCOPHAGE) 1000 MG tablet Take 1,000 mg by mouth 2 (two) times daily.   mirtazapine (REMERON) 30 MG tablet Take 1 tablet by mouth at bedtime.    nitroGLYCERIN (NITROSTAT) 0.4 MG SL tablet Place 1 tablet (0.4 mg total) under the tongue every 5 (five) minutes as needed for chest pain. (Patient taking differently: Place 0.4 mg under the tongue daily as needed (for difficulty breathing d/t fluid in lungs).)   nystatin cream (MYCOSTATIN) Apply 1 Application topically 2 (two) times daily.   nystatin-triamcinolone (MYCOLOG II) cream Apply 1 application topically daily as needed (rash).    oxyCODONE-acetaminophen (PERCOCET) 10-325 MG per tablet Take 1-2 tablets by mouth 2 (two) times daily as needed for pain.    polycarbophil (FIBERCON) 625 MG tablet Take 625 mg by mouth daily.   SOOLANTRA 1 % CREA Apply 1 application  topically daily.   tamsulosin (FLOMAX) 0.4 MG CAPS capsule Take 0.4 capsules by mouth daily.   tiZANidine (ZANAFLEX) 4 MG tablet Take 4 mg by mouth at bedtime.   Torsemide 40 MG TABS Take 40 mg by mouth daily.   triamcinolone cream (KENALOG) 0.1 % Apply 1 Application topically 2 (two) times daily as needed (for itching).   ULORIC 40 MG tablet Take 1 tablet by mouth Daily.   VIIBRYD 40 MG TABS Take 40 mg by mouth Daily.      Allergies:   Cefuroxime, Ibuprofen, Iodinated contrast media, Cefpodoxime, Ciprofloxacin, Doxycycline, Sulfamethoxazole-trimethoprim, Fluocinolone, Probenecid, Allopurinol, Azithromycin, and Sulfa antibiotics   Social History   Socioeconomic History   Marital status: Married    Spouse name: Not on file   Number of  children: 2   Years of education: Not on file   Highest education level: Not on file  Occupational History   Occupation: Retired-Owned a lumberyard/sawmill  Tobacco Use   Smoking status: Never   Smokeless tobacco: Never  Substance and Sexual Activity  Alcohol use: Not Currently   Drug use: No   Sexual activity: Not Currently  Other Topics Concern   Not on file  Social History Narrative   Not on file   Social Determinants of Health   Financial Resource Strain: Not on file  Food Insecurity: No Food Insecurity (02/20/2023)   Hunger Vital Sign    Worried About Running Out of Food in the Last Year: Never true    Ran Out of Food in the Last Year: Never true  Transportation Needs: No Transportation Needs (02/20/2023)   PRAPARE - Administrator, Civil Service (Medical): No    Lack of Transportation (Non-Medical): No  Physical Activity: Not on file  Stress: Not on file  Social Connections: Not on file     Family History: The patient's family history includes Prostate cancer in his father; Valvular heart disease in his father.  ROS:   Please see the history of present illness.    All other systems reviewed and are negative.  EKGs/Labs/Other Studies Reviewed:    Cardiac Studies & Procedures   CARDIAC CATHETERIZATION  CARDIAC CATHETERIZATION 01/11/2023  Narrative   Prox LAD lesion is 100% stenosed.   Mid RCA lesion is 100% stenosed.   Origin to Prox Graft lesion is 100% stenosed.   Origin to Prox Graft lesion is 100% stenosed.   Ost 1st Diag lesion is 90% stenosed.   Ost 1st Mrg lesion is 100% stenosed.   Prox RCA lesion is 60% stenosed.   Mid LAD to Dist LAD lesion is 50% stenosed.   Ost LM to Mid LM lesion is 40% stenosed.   SVG graft was not injected.   SVG graft was not injected.   LIMA graft was visualized by angiography.  Chronic total occlusion of the mid LAD. The mid and distal LAD has diffuse moderate disease and fills from the patent LIMA graft.  The  small to moderate caliber diagonal branch has diffuse severe disease, unchanged from last cath. The SVG to the diagonal is known to be occluded. The Circumflex has mild proximal stenosis. The first OM branch is chronically occluded and fills from the patent SVG graft. The large dominant RCA has chronic total occlusion in the mid vessel. The SVG to the distal RCA is known to be occluded. The distal RCA branches fill from left to right collaterals.  RA 1 RV 30/0/3 PA 33/12 mean 24 PCWP 4 AO 105/54  Recommendations: Continue medical management of CAD. Diffuse three vessel CAD with continued patency of the LIMA to LAD and SVG to OM. No focal targets for PCI. Normal right heart pressures. Will continue workup for TAVR. He will likely need to have his Pre-TAVR CT scans staged a week from now given the contrast load today. He can be discharged home tomorrow if stable and we can see him in our structural heart office to discuss TAVR in detail and plan his scans.  Findings Coronary Findings Diagnostic  Dominance: Right  Left Main Ost LM to Mid LM lesion is 40% stenosed.  Left Anterior Descending Prox LAD lesion is 100% stenosed. Mid LAD to Dist LAD lesion is 50% stenosed.  First Diagonal Branch Ost 1st Diag lesion is 90% stenosed.  Left Circumflex  First Obtuse Marginal Branch Ost 1st Mrg lesion is 100% stenosed.  Right Coronary Artery Vessel is large. Prox RCA lesion is 60% stenosed. Mid RCA lesion is 100% stenosed.  Right Posterior Atrioventricular Artery Collaterals RPAV filled by collaterals from 2nd Sept.  Saphenous Graft To Dist RCA SVG graft was not injected. Origin to Prox Graft lesion is 100% stenosed. The lesion was previously treated .  Saphenous Graft To 1st Diag SVG graft was not injected. Origin to Prox Graft lesion is 100% stenosed.  LIMA LIMA Graft To Mid LAD LIMA graft was visualized by angiography.  Graft To 1st Mrg  Intervention  No interventions have  been documented.   CARDIAC CATHETERIZATION  CARDIAC CATHETERIZATION 06/18/2018  Narrative Images from the original result were not included.   Prox LAD lesion is 100% stenosed.  Ost 1st Diag lesion is 90% stenosed.  1st Mrg lesion is 100% stenosed.  Mid RCA lesion is 100% stenosed.  Origin to Prox Graft lesion is 100% stenosed.  Origin to Prox Graft lesion is 100% stenosed.  The left ventricular systolic function is normal.  LV end diastolic pressure is mildly elevated.  The left ventricular ejection fraction is 50-55% by visual estimate.  Dylan Bruce is a 77 y.o. male   [id number] LOCATION:  FACILITY: [facility] PHYSICIAN: Nanetta Batty, M.D. [birth year]   DATE OF PROCEDURE:  06/18/2018  DATE OF DISCHARGE:     CARDIAC CATHETERIZATION    History obtained from chart review.Mr. Lotz is a pleasant 77 year old Caucasian male with past medical history of hypertension, CAD s/p CABG in 2003, obstructive sleep apnea on CPAP and history of PAD.  His last cardiac catheterization was in 2010 at which time he underwent PCI to DES to PDA.  As part of the preop clearance, he underwent repeat echocardiogram and a stress test in 2018.  Echocardiogram obtained on 04/23/2017 showed EF 60 to 65%, mild to moderate LVH, grade 2 DD, mild aortic stenosis, dilated ascending aorta.  Myoview obtained in July 2018 showed large in size, severe, fixed defect involving basal inferolateral, basal anterolateral, mid inferolateral, mid anterolateral, and apical lateral segment consistent with his previous scar, no significant ischemia was noted.  He was in his usual state of health until he woke up around 3 AM in the morning of 06/17/2018.  He describes the symptom as sharp pain between the shoulder blades.  He says this is similar to what he has experienced in the past.  He eventually sought medical attention at [facility] emergency department.  EKG showed normal sinus rhythm, first-degree AV block, T wave  inversion noted in lead I and aVL with very mild nonspecific changes in V5 and V6.  Similar to previous tracing in June 2018.  Troponin was negative.  Creatinine was 1.10.  Potassium in the liver function normal.  His symptom was concerning for unstable angina and that he was subsequently transferred to [facility] for further evaluation.  Because of chest pain and known graft disease despite negative enzymes it was elected to proceed with diagnostic coronary angiography.  Impression Mr. Wilson Singer has an occluded vein to the distal right and to the diagonal branch.  The LIMA to the LAD is widely patent.  He does have left to right collaterals.  The vein to it OM branch is patent as well.  He has normal LV function.  Medical therapy will be recommended.  The sheath was removed and pressure held in the groin to achieve hemostasis.  The patient left the lab in stable condition.  Nanetta Batty. MD, [facility] 06/18/2018 10:28 AM      No indication for antiplatelet therapy at this time.  Findings Coronary Findings Diagnostic  Dominance: Right  Left Anterior Descending Prox LAD lesion is 100% stenosed.  First Diagonal Branch Ost 1st Diag lesion is 90% stenosed.  Left Circumflex  First Obtuse Marginal Branch 1st Mrg lesion is 100% stenosed.  Right Coronary Artery Mid RCA lesion is 100% stenosed.  Right Posterior Atrioventricular Artery Collaterals RPAV filled by collaterals from Mid LAD.  Graft To 1st Mrg  LIMA Graft To Mid LAD  Graft To Dist RCA Origin to Prox Graft lesion is 100% stenosed. The lesion was previously treated.  Graft To 1st Diag Origin to Prox Graft lesion is 100% stenosed.  Intervention  No interventions have been documented.     ECHOCARDIOGRAM  ECHOCARDIOGRAM COMPLETE 05/04/2023  Narrative ECHOCARDIOGRAM REPORT    Patient Name:   Dylan Bruce [facility] Date of Exam: 05/04/2023 Medical Rec #:  [id number]      Height:       74.0 in Accession #:    [id number]      Weight:       227.2 lb Date of Birth:  [birth year]      BSA:          2.295 m Patient Age:    77 years       BP:           130/62 mmHg Patient Gender: M              HR:           60 bpm. Exam Location:  [address]  Procedure: 2D Echo, Cardiac Doppler and Color Doppler  Indications:    Z95.2 Status post TAVR  History:        Patient has prior history of Echocardiogram examinations, most recent 02/22/2023. CHF, CAD, TAVR-29 mm S3UR; Risk Factors:Diabetes, Dyslipidemia and Sleep Apnea.  Sonographer:    Daphine Deutscher RDCS Referring Phys: [phone] JILL D MCDANIEL  IMPRESSIONS   1. Left ventricular ejection fraction, by estimation, is 60 to 65%. The left ventricle has normal function. The left ventricle has no regional wall motion abnormalities. Left ventricular diastolic parameters are indeterminate. 2. Right ventricular systolic function is normal. The right ventricular size is normal. 3. Left atrial size was severely dilated. 4. The mitral valve is degenerative. Mild mitral valve regurgitation. No evidence of mitral stenosis. 5. The aortic valve has been repaired/replaced. Aortic valve regurgitation is not visualized. No aortic stenosis is present. Echo findings are consistent with normal structure and function of the aortic valve prosthesis. Aortic valve mean gradient measures 5.8 mmHg. Aortic valve Vmax measures 1.53 m/s. 6. There is borderline dilatation of the aortic root, measuring 38 mm. There is mild dilatation of the ascending aorta, measuring 41 mm. 7. The inferior vena cava is dilated in size with >50% respiratory variability, suggesting right atrial pressure of 8 mmHg.  FINDINGS Left Ventricle: Left ventricular ejection fraction, by estimation, is 60 to 65%. The left ventricle has normal function. The left ventricle has no regional wall motion abnormalities. The left ventricular internal cavity size was normal in size. There is no left ventricular hypertrophy. Left  ventricular diastolic parameters are indeterminate.  Right Ventricle: The right ventricular size is normal. No increase in right ventricular wall thickness. Right ventricular systolic function is normal.  Left Atrium: Left atrial size was severely dilated.  Right Atrium: Right atrial size was normal in size.  Pericardium: There is no evidence of pericardial effusion.  Mitral Valve: The mitral valve is degenerative in appearance. Mild mitral valve regurgitation. No evidence of mitral valve stenosis.  Tricuspid Valve: The tricuspid valve is normal in structure. Tricuspid valve  regurgitation is not demonstrated. No evidence of tricuspid stenosis.  Aortic Valve: The aortic valve has been repaired/replaced. Aortic valve regurgitation is not visualized. No aortic stenosis is present. Aortic valve mean gradient measures 5.8 mmHg. Aortic valve peak gradient measures 9.3 mmHg. There is a 29 mm Sapien prosthetic, stented (TAVR) valve present in the aortic position. Echo findings are consistent with normal structure and function of the aortic valve prosthesis.  Pulmonic Valve: The pulmonic valve was normal in structure. Pulmonic valve regurgitation is trivial. No evidence of pulmonic stenosis.  Aorta: The aortic root is normal in size and structure. There is borderline dilatation of the aortic root, measuring 38 mm. There is mild dilatation of the ascending aorta, measuring 41 mm.  Venous: The inferior vena cava is dilated in size with greater than 50% respiratory variability, suggesting right atrial pressure of 8 mmHg.  IAS/Shunts: No atrial level shunt detected by color flow Doppler.   LEFT VENTRICLE PLAX 2D LVIDd:         5.30 cm Diastology LVIDs:         3.40 cm LV e' medial:    5.98 cm/s LV PW:         0.90 cm LV E/e' medial:  22.3 LV IVS:        0.90 cm LV e' lateral:   11.33 cm/s LV E/e' lateral: 11.8   RIGHT VENTRICLE             IVC RV Basal diam:  4.00 cm     IVC diam: 2.50 cm RV  S prime:     11.95 cm/s TAPSE (M-mode): 1.6 cm  LEFT ATRIUM             Index        RIGHT ATRIUM           Index LA diam:        5.00 cm 2.18 cm/m   RA Area:     19.30 cm LA Vol (A2C):   78.4 ml 34.17 ml/m  RA Volume:   52.70 ml  22.97 ml/m LA Vol (A4C):   80.8 ml 35.21 ml/m LA Biplane Vol: 80.9 ml 35.26 ml/m AORTIC VALVE AV Vmax:           152.80 cm/s AV Vmean:          111.580 cm/s AV VTI:            0.302 m AV Peak Grad:      9.3 mmHg AV Mean Grad:      5.8 mmHg LVOT Vmax:         120.50 cm/s LVOT Vmean:        71.850 cm/s LVOT VTI:          0.209 m LVOT/AV VTI ratio: 0.69  AORTA Ao Root diam: 3.80 cm Ao Asc diam:  4.10 cm  MITRAL VALVE MV Area (PHT): 4.35 cm     SHUNTS MV Decel Time: 175 msec     Systemic VTI: 0.21 m MR Peak grad: 76.9 mmHg MR Mean grad: 48.5 mmHg MR Vmax:      438.50 cm/s MR Vmean:     327.5 cm/s MV E velocity: 133.50 cm/s MV A velocity: 42.50 cm/s MV E/A ratio:  3.14  Arvilla Meres MD Electronically signed by Arvilla Meres MD Signature Date/Time: 05/04/2023/6:47:42 PM    Final     CT SCANS  CT CORONARY MORPH W/CTA COR W/SCORE 02/07/2023  Addendum 02/07/2023 10:09 AM ADDENDUM REPORT: 02/07/2023 10:07  CLINICAL DATA:  Aortic valve replacement (TAVR), pre-op eval  EXAM: Cardiac TAVR CT  TECHNIQUE: The patient was scanned on a Siemens Force 192 slice scanner. A 120 kV retrospective scan was triggered in the descending thoracic aorta at 111 HU's. Gantry rotation speed was 270 msecs and collimation was .9 mm. The 3D data set was reconstructed in 5% intervals of the R-R cycle. Systolic and diastolic phases were analyzed on a dedicated work station using MPR, MIP and VRT modes. The patient received 80mL OMNIPAQUE IOHEXOL 350 MG/ML SOLN of contrast.  FINDINGS: Aortic Valve:  Tricuspid aortic valve with severely reduced cusp excursion. Severely thickened and severely calcified aortic valve cusps.  AV calcium score:  6409  Virtual Basal Annulus Measurements: Measured at 15% R-R and 25% R-R, similar valve size range.  Maximum/Minimum Diameter: 30.7 x 25.3 mm  Perimeter: 86.9 mm  Area:  578 mm2  No significant LVOT calcifications.  Membranous septal length: 7 mm  Based on these measurements, the annulus would be suitable for a 29 mm Sapien 3 valve. Alternatively, Heart Team can consider 34 mm Evolut valve. Recommend Heart Team discussion for valve selection.  Sinus of Valsalva Measurements:  Non-coronary:  40 mm  Right - coronary:  39 mm  Left - coronary:  39 mm  Sinus of Valsalva Height:  Left: 26 mm  Right: 24 mm  Aorta: Common origin of the brachiocephalic and left common carotid arteries off the aortic arch.  Sinotubular Junction:  35 mm  Ascending Thoracic Aorta:  40 mm  Aortic Arch:  33 mm  Descending Thoracic Aorta: 29 mm  Coronary Artery Height above Annulus:  Left main: 20 mm  Right coronary: 20 mm  Coronary Arteries: Normal coronary origin. Right dominance. Prior CABG. 3 vessel native CAD. LIMA to LAD patent. SVG to OM patent. SVG to RCA appears stented and occluded distal to stent. SVG to diagonal occluded.  Optimum Fluoroscopic Angle for Delivery: RAO 2 CRA 1  OTHER:  Fatty metaplasia of the subendocardial left ventricular lateral wall and apex consistent with prior myocardial infarction. See screen captures.  Atria: Grossly normal  Left atrial appendage: No thrombus.  Small caliber.  Mitral valve: Grossly normal, no mitral annular calcifications, trivial anterior leaflet calcification.  Pulmonary artery: Moderately dilated, 38 mm.  Pulmonary veins: Normal anatomy.  IMPRESSION: 1. Tricuspid aortic valve with severely reduced cusp excursion. Severely thickened and severely calcified aortic valve cusps. 2. Aortic valve calcium score: 6409 3. Annulus area: 578 mm2, suitable for 29 mm Sapien 3 valve. No LVOT calcifications. Membranous septal  length 7 mm. 4. Sufficient coronary artery heights from annulus. 5. Optimum fluoroscopic angle for delivery:  RAO 2 CRA 1   Electronically Signed By: Weston Brass M.D. On: 02/07/2023 10:07  Narrative EXAM: OVER-READ INTERPRETATION  CT CHEST  The following report is a limited chest CT over-read performed by radiologist Dr. Allegra Lai of [facility] on 02/06/2023. This over-read does not include interpretation of cardiac or coronary anatomy or pathology. The cardiac TAVR interpretation by the cardiologist is attached.  COMPARISON:  None Available.  FINDINGS: Extracardiac findings will be described separately under dictation for contemporaneously obtained CTA chest, abdomen and pelvis.  IMPRESSION: Please see separate dictation for contemporaneously obtained CTA chest, abdomen and pelvis dated 02/06/2023 for full description of relevant extracardiac findings.  Electronically Signed: By: Allegra Lai M.D. On: 02/06/2023 12:30          EKG:  EKG is ordered today.  The ekg  ordered today demonstrates atrial flutter with 4:1 AV conduction, HR 68 bpm  Recent Labs: 02/19/2023: ALT 48; B Natriuretic Peptide 331.6 02/20/2023: TSH 2.667 02/22/2023: Magnesium 2.1 02/26/2023: BUN 39; Creatinine, Ser 1.52; Hemoglobin 11.0; Platelets 315; Potassium 4.0; Sodium 141  Recent Lipid Panel    Component Value Date/Time   CHOL 91 01/10/2023 0216   TRIG 149 01/10/2023 0216   HDL 24 (L) 01/10/2023 0216   CHOLHDL 3.8 01/10/2023 0216   VLDL 30 01/10/2023 0216   LDLCALC 37 01/10/2023 0216     Risk Assessment/Calculations:    CHA2DS2-VASc Score = 6   This indicates a 9.7% annual risk of stroke. The patient's score is based upon: CHF History: 1 HTN History: 1 Diabetes History: 1 Stroke History: 0 Vascular Disease History: 1 Age Score: 2 Gender Score: 0      Physical Exam:    VS:  BP 122/62   Pulse 68   Ht 6\' 2"  (1.88 m)   Wt 209 lb 3.2 oz (94.9 kg)    SpO2 93%   BMI 26.86 kg/m     Wt Readings from Last 3 Encounters:  05/04/23 209 lb 3.2 oz (94.9 kg)  02/26/23 227 lb 3.2 oz (103.1 kg)  02/22/23 217 lb 13 oz (98.8 kg)     GEN:  Well nourished, well developed in no acute distress HEENT: Normal NECK: No JVD LYMPHATICS: No lymphadenopathy CARDIAC: RRR, no murmurs, rubs, gallops RESPIRATORY:  Clear to auscultation without rales, wheezing or rhonchi  ABDOMEN: Soft, non-tender, non-distended MUSCULOSKELETAL:  1+ LE edema, chronic venous stasis with bandages; No deformity  SKIN: Warm and dry NEUROLOGIC:  Alert and oriented x 3 PSYCHIATRIC:  Normal affect   ASSESSMENT:    1. S/P TAVR (transcatheter aortic valve replacement)   2. S/P CABG (coronary artery bypass graft)   3. Chronic diastolic CHF (congestive heart failure) (HCC)   4. Atrial flutter, unspecified type (HCC)   5. History of 2nd degree AV block   6. PAD (peripheral artery disease) (HCC)   7. Primary hypertension   8. Hyperlipidemia, unspecified hyperlipidemia type   9. Stage 3 chronic kidney disease, unspecified whether stage 3a or 3b CKD (HCC)   10. Hematuria, unspecified type   11. Thyroid nodule   12. Pulmonary nodule   13. Kidney lesion    PLAN:    In order of problems listed above:  Severe AS s/p TAVR: echo today shows EF 65%, normally functioning TAVR with a mean gradient of 5.8 mm hg and no PVL. He has NYHA class I symptoms. He has Amoxicillin 2g for SBE prophylaxis. Continue on Eliquis and plavix. I will see him back in 1 year for follow up with echo.    CAD s/p CABG:  Last R/LHC in 01/2023 showed 2/4 patent grafts (LIMA to LAD and SVG to OM) with known occlusion of SVG to distal RCA and SVG to 1st Diag. There were no focal targets for PCI and continued medical therapy was recommended. Continue Imdur 30mg  daily, Plavix monotherapy (no aspirin given need for full anticoagulation), statin/ Zetia.   Chronic Diastolic CHF: appears euvolemic. Continue on Torsemide  40mg  daily and Jardiance 10mg  daily.    Persistent Atrial Flutter: this was newly diagnosed after TAVR. Rate well controlled off AV nodal blocking agents. Continue Eliquis 5 twice daily. Remains in atrial flutter today. I offered to set up DCCV but he feels well at this time and declines this.   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 Continue Plavix monotherapy. Aspirin stopped during recent admission due to need for full anticoagulation. Continue statin/ Zetia.  HTN: BP well controlled. No changes made   HLD: lipid panel in 01/2023: Total Cholesterol 91, Triglycerides 149, HDL 24, LDL 37. Continue Lipitor 80mg  daily and Zetia 10mg  daily.   CKD Stage III: baseline creatinine around 1.7. Creatinine stable at 1.30 on 04/10/23  Hematuria: this resolved.    _________________  Incidental findings:   Thyroid Nodule Pulmonary Nodule Lesion on Left Kidney These were discussed again today. The thyroid nodule and kidney nodule are being followed by his PCP. The pulmonary nodule requires no follow up as he is low risk.   Medication Adjustments/Labs and Tests Ordered: Current medicines are reviewed at length with the patient today.  Concerns regarding medicines are outlined above.  Orders Placed This Encounter  Procedures   EKG 12-Lead   No orders of the defined types were placed in this encounter.   Patient Instructions  Medication Instructions:  Your physician recommends that you continue on your current medications as directed. Please refer to the Current Medication list given to you today.  *If you need a refill on your cardiac medications before your next appointment, please call your pharmacy*   Lab Work: NONE If you have labs (blood work) drawn today and your tests are completely normal, you will receive your  results only by: MyChart Message (if you have MyChart) OR A paper copy in the mail If you have any lab test that is abnormal or we need to change your treatment, we will call you to review the results.   Testing/Procedures: NONE   Follow-Up: At [facility], you and your health needs are our priority.  As part of our continuing mission to provide you with exceptional heart care, we have created designated Provider Care Teams.  These Care Teams include your primary Cardiologist (physician) and Advanced Practice Providers (APPs -  Physician Assistants and Nurse Practitioners) who all work together to provide you with the care you need, when you need it.  We recommend signing up for the patient portal called "MyChart".  Sign up information is provided on this After Visit Summary.  MyChart is used to connect with patients for Virtual Visits (Telemedicine).  Patients are able to view lab/test results, encounter notes, upcoming appointments, etc.  Non-urgent messages can be sent to your provider as well.   To learn more about what you can do with MyChart, go to [email or web address].    Your next appointment:   KEEP SCHEDULED APPOINTMENT    Signed, Cline Crock, PA-C  05/06/2023 8:35 PM    [facility]

## 2023-05-04 ENCOUNTER — Ambulatory Visit (INDEPENDENT_AMBULATORY_CARE_PROVIDER_SITE_OTHER): Payer: 59 | Admitting: Physician Assistant

## 2023-05-04 ENCOUNTER — Ambulatory Visit (HOSPITAL_COMMUNITY): Payer: 59 | Attending: Cardiology

## 2023-05-04 VITALS — BP 122/62 | HR 68 | Ht 74.0 in | Wt 209.2 lb

## 2023-05-04 DIAGNOSIS — E041 Nontoxic single thyroid nodule: Secondary | ICD-10-CM | POA: Insufficient documentation

## 2023-05-04 DIAGNOSIS — Z951 Presence of aortocoronary bypass graft: Secondary | ICD-10-CM | POA: Insufficient documentation

## 2023-05-04 DIAGNOSIS — I4892 Unspecified atrial flutter: Secondary | ICD-10-CM | POA: Insufficient documentation

## 2023-05-04 DIAGNOSIS — R319 Hematuria, unspecified: Secondary | ICD-10-CM | POA: Insufficient documentation

## 2023-05-04 DIAGNOSIS — I5032 Chronic diastolic (congestive) heart failure: Secondary | ICD-10-CM | POA: Insufficient documentation

## 2023-05-04 DIAGNOSIS — N183 Chronic kidney disease, stage 3 unspecified: Secondary | ICD-10-CM

## 2023-05-04 DIAGNOSIS — I739 Peripheral vascular disease, unspecified: Secondary | ICD-10-CM | POA: Insufficient documentation

## 2023-05-04 DIAGNOSIS — Z952 Presence of prosthetic heart valve: Secondary | ICD-10-CM | POA: Insufficient documentation

## 2023-05-04 DIAGNOSIS — E785 Hyperlipidemia, unspecified: Secondary | ICD-10-CM | POA: Insufficient documentation

## 2023-05-04 DIAGNOSIS — I10 Essential (primary) hypertension: Secondary | ICD-10-CM | POA: Insufficient documentation

## 2023-05-04 DIAGNOSIS — I441 Atrioventricular block, second degree: Secondary | ICD-10-CM | POA: Insufficient documentation

## 2023-05-04 DIAGNOSIS — R911 Solitary pulmonary nodule: Secondary | ICD-10-CM | POA: Insufficient documentation

## 2023-05-04 DIAGNOSIS — N289 Disorder of kidney and ureter, unspecified: Secondary | ICD-10-CM

## 2023-05-04 LAB — ECHOCARDIOGRAM COMPLETE
AV Mean grad: 5.8 mmHg
AV Peak grad: 9.3 mmHg
Ao pk vel: 1.53 m/s
Area-P 1/2: 4.35 cm2
MV M vel: 4.39 m/s
MV Peak grad: 76.9 mmHg
S' Lateral: 3.4 cm

## 2023-05-04 NOTE — Patient Instructions (Signed)
Medication Instructions:  Your physician recommends that you continue on your current medications as directed. Please refer to the Current Medication list given to you today.  *If you need a refill on your cardiac medications before your next appointment, please call your pharmacy*   Lab Work: NONE If you have labs (blood work) drawn today and your tests are completely normal, you will receive your results only by: MyChart Message (if you have MyChart) OR A paper copy in the mail If you have any lab test that is abnormal or we need to change your treatment, we will call you to review the results.   Testing/Procedures: NONE   Follow-Up: At [facility], you and your health needs are our priority.  As part of our continuing mission to provide you with exceptional heart care, we have created designated Provider Care Teams.  These Care Teams include your primary Cardiologist (physician) and Advanced Practice Providers (APPs -  Physician Assistants and Nurse Practitioners) who all work together to provide you with the care you need, when you need it.  We recommend signing up for the patient portal called "MyChart".  Sign up information is provided on this After Visit Summary.  MyChart is used to connect with patients for Virtual Visits (Telemedicine).  Patients are able to view lab/test results, encounter notes, upcoming appointments, etc.  Non-urgent messages can be sent to your provider as well.   To learn more about what you can do with MyChart, go to [email or web address].    Your next appointment:   KEEP SCHEDULED APPOINTMENT  

## 2023-05-07 ENCOUNTER — Other Ambulatory Visit (HOSPITAL_COMMUNITY): Payer: BC Managed Care – PPO

## 2023-05-07 ENCOUNTER — Ambulatory Visit: Payer: BC Managed Care – PPO

## 2023-06-09 ENCOUNTER — Inpatient Hospital Stay (HOSPITAL_COMMUNITY)
Admission: AD | Admit: 2023-06-09 | Discharge: 2023-06-16 | DRG: 480 | Disposition: A | Discharge status: Rehabilitation Facility | Payer: Commercial Managed Care - PPO | Source: Other Acute Inpatient Hospital | Attending: Internal Medicine | Admitting: Internal Medicine

## 2023-06-09 ENCOUNTER — Encounter (HOSPITAL_COMMUNITY): Payer: Self-pay

## 2023-06-09 ENCOUNTER — Inpatient Hospital Stay (HOSPITAL_COMMUNITY): Payer: Commercial Managed Care - PPO

## 2023-06-09 ENCOUNTER — Other Ambulatory Visit: Payer: Self-pay | Admitting: Internal Medicine

## 2023-06-09 ENCOUNTER — Encounter (HOSPITAL_COMMUNITY): Payer: Self-pay | Admitting: Internal Medicine

## 2023-06-09 ENCOUNTER — Other Ambulatory Visit: Payer: Self-pay

## 2023-06-09 DIAGNOSIS — E119 Type 2 diabetes mellitus without complications: Secondary | ICD-10-CM | POA: Diagnosis not present

## 2023-06-09 DIAGNOSIS — K219 Gastro-esophageal reflux disease without esophagitis: Secondary | ICD-10-CM | POA: Diagnosis present

## 2023-06-09 DIAGNOSIS — M7501 Adhesive capsulitis of right shoulder: Secondary | ICD-10-CM | POA: Diagnosis present

## 2023-06-09 DIAGNOSIS — I129 Hypertensive chronic kidney disease with stage 1 through stage 4 chronic kidney disease, or unspecified chronic kidney disease: Secondary | ICD-10-CM | POA: Diagnosis not present

## 2023-06-09 DIAGNOSIS — Z951 Presence of aortocoronary bypass graft: Secondary | ICD-10-CM

## 2023-06-09 DIAGNOSIS — I83019 Varicose veins of right lower extremity with ulcer of unspecified site: Secondary | ICD-10-CM

## 2023-06-09 DIAGNOSIS — I2510 Atherosclerotic heart disease of native coronary artery without angina pectoris: Secondary | ICD-10-CM | POA: Diagnosis present

## 2023-06-09 DIAGNOSIS — N1832 Chronic kidney disease, stage 3b: Secondary | ICD-10-CM | POA: Diagnosis not present

## 2023-06-09 DIAGNOSIS — N400 Enlarged prostate without lower urinary tract symptoms: Secondary | ICD-10-CM | POA: Diagnosis present

## 2023-06-09 DIAGNOSIS — L89159 Pressure ulcer of sacral region, unspecified stage: Secondary | ICD-10-CM | POA: Diagnosis present

## 2023-06-09 DIAGNOSIS — Z952 Presence of prosthetic heart valve: Secondary | ICD-10-CM | POA: Diagnosis not present

## 2023-06-09 DIAGNOSIS — M549 Dorsalgia, unspecified: Secondary | ICD-10-CM | POA: Diagnosis present

## 2023-06-09 DIAGNOSIS — E43 Unspecified severe protein-calorie malnutrition: Secondary | ICD-10-CM | POA: Insufficient documentation

## 2023-06-09 DIAGNOSIS — Q059 Spina bifida, unspecified: Secondary | ICD-10-CM | POA: Diagnosis not present

## 2023-06-09 DIAGNOSIS — E1140 Type 2 diabetes mellitus with diabetic neuropathy, unspecified: Secondary | ICD-10-CM | POA: Diagnosis present

## 2023-06-09 DIAGNOSIS — Z8249 Family history of ischemic heart disease and other diseases of the circulatory system: Secondary | ICD-10-CM

## 2023-06-09 DIAGNOSIS — S2242XD Multiple fractures of ribs, left side, subsequent encounter for fracture with routine healing: Secondary | ICD-10-CM | POA: Diagnosis not present

## 2023-06-09 DIAGNOSIS — I83018 Varicose veins of right lower extremity with ulcer other part of lower leg: Secondary | ICD-10-CM | POA: Diagnosis present

## 2023-06-09 DIAGNOSIS — L719 Rosacea, unspecified: Secondary | ICD-10-CM | POA: Diagnosis present

## 2023-06-09 DIAGNOSIS — K5900 Constipation, unspecified: Secondary | ICD-10-CM | POA: Diagnosis not present

## 2023-06-09 DIAGNOSIS — E1169 Type 2 diabetes mellitus with other specified complication: Secondary | ICD-10-CM | POA: Diagnosis present

## 2023-06-09 DIAGNOSIS — Z794 Long term (current) use of insulin: Secondary | ICD-10-CM | POA: Diagnosis not present

## 2023-06-09 DIAGNOSIS — Z6825 Body mass index (BMI) 25.0-25.9, adult: Secondary | ICD-10-CM

## 2023-06-09 DIAGNOSIS — I4892 Unspecified atrial flutter: Secondary | ICD-10-CM | POA: Diagnosis present

## 2023-06-09 DIAGNOSIS — Z79899 Other long term (current) drug therapy: Secondary | ICD-10-CM

## 2023-06-09 DIAGNOSIS — I110 Hypertensive heart disease with heart failure: Secondary | ICD-10-CM | POA: Diagnosis not present

## 2023-06-09 DIAGNOSIS — L97919 Non-pressure chronic ulcer of unspecified part of right lower leg with unspecified severity: Secondary | ICD-10-CM

## 2023-06-09 DIAGNOSIS — E785 Hyperlipidemia, unspecified: Secondary | ICD-10-CM | POA: Diagnosis present

## 2023-06-09 DIAGNOSIS — D72829 Elevated white blood cell count, unspecified: Secondary | ICD-10-CM | POA: Diagnosis not present

## 2023-06-09 DIAGNOSIS — E1122 Type 2 diabetes mellitus with diabetic chronic kidney disease: Secondary | ICD-10-CM | POA: Diagnosis present

## 2023-06-09 DIAGNOSIS — L4050 Arthropathic psoriasis, unspecified: Secondary | ICD-10-CM | POA: Diagnosis not present

## 2023-06-09 DIAGNOSIS — G8929 Other chronic pain: Secondary | ICD-10-CM | POA: Diagnosis present

## 2023-06-09 DIAGNOSIS — L97519 Non-pressure chronic ulcer of other part of right foot with unspecified severity: Secondary | ICD-10-CM | POA: Diagnosis present

## 2023-06-09 DIAGNOSIS — W19XXXA Unspecified fall, initial encounter: Secondary | ICD-10-CM | POA: Diagnosis present

## 2023-06-09 DIAGNOSIS — E1165 Type 2 diabetes mellitus with hyperglycemia: Secondary | ICD-10-CM | POA: Diagnosis present

## 2023-06-09 DIAGNOSIS — Z85828 Personal history of other malignant neoplasm of skin: Secondary | ICD-10-CM

## 2023-06-09 DIAGNOSIS — I83891 Varicose veins of right lower extremities with other complications: Secondary | ICD-10-CM | POA: Diagnosis not present

## 2023-06-09 DIAGNOSIS — I480 Paroxysmal atrial fibrillation: Secondary | ICD-10-CM | POA: Diagnosis not present

## 2023-06-09 DIAGNOSIS — F411 Generalized anxiety disorder: Secondary | ICD-10-CM | POA: Diagnosis not present

## 2023-06-09 DIAGNOSIS — L97419 Non-pressure chronic ulcer of right heel and midfoot with unspecified severity: Secondary | ICD-10-CM

## 2023-06-09 DIAGNOSIS — C4492 Squamous cell carcinoma of skin, unspecified: Secondary | ICD-10-CM

## 2023-06-09 DIAGNOSIS — Z96612 Presence of left artificial shoulder joint: Secondary | ICD-10-CM | POA: Diagnosis present

## 2023-06-09 DIAGNOSIS — S72002A Fracture of unspecified part of neck of left femur, initial encounter for closed fracture: Secondary | ICD-10-CM | POA: Diagnosis not present

## 2023-06-09 DIAGNOSIS — Z7901 Long term (current) use of anticoagulants: Secondary | ICD-10-CM

## 2023-06-09 DIAGNOSIS — Z5986 Financial insecurity: Secondary | ICD-10-CM

## 2023-06-09 DIAGNOSIS — K5901 Slow transit constipation: Secondary | ICD-10-CM | POA: Diagnosis not present

## 2023-06-09 DIAGNOSIS — F4322 Adjustment disorder with anxiety: Secondary | ICD-10-CM | POA: Diagnosis not present

## 2023-06-09 DIAGNOSIS — Z882 Allergy status to sulfonamides status: Secondary | ICD-10-CM

## 2023-06-09 DIAGNOSIS — E1151 Type 2 diabetes mellitus with diabetic peripheral angiopathy without gangrene: Secondary | ICD-10-CM | POA: Diagnosis present

## 2023-06-09 DIAGNOSIS — S72145A Nondisplaced intertrochanteric fracture of left femur, initial encounter for closed fracture: Principal | ICD-10-CM | POA: Diagnosis present

## 2023-06-09 DIAGNOSIS — S2242XA Multiple fractures of ribs, left side, initial encounter for closed fracture: Secondary | ICD-10-CM | POA: Diagnosis not present

## 2023-06-09 DIAGNOSIS — I10 Essential (primary) hypertension: Secondary | ICD-10-CM | POA: Diagnosis present

## 2023-06-09 DIAGNOSIS — Z7984 Long term (current) use of oral hypoglycemic drugs: Secondary | ICD-10-CM

## 2023-06-09 DIAGNOSIS — I5043 Acute on chronic combined systolic (congestive) and diastolic (congestive) heart failure: Secondary | ICD-10-CM | POA: Diagnosis not present

## 2023-06-09 DIAGNOSIS — L97909 Non-pressure chronic ulcer of unspecified part of unspecified lower leg with unspecified severity: Secondary | ICD-10-CM | POA: Diagnosis not present

## 2023-06-09 DIAGNOSIS — E11621 Type 2 diabetes mellitus with foot ulcer: Secondary | ICD-10-CM | POA: Diagnosis present

## 2023-06-09 DIAGNOSIS — N179 Acute kidney failure, unspecified: Secondary | ICD-10-CM | POA: Diagnosis not present

## 2023-06-09 DIAGNOSIS — R296 Repeated falls: Secondary | ICD-10-CM | POA: Diagnosis present

## 2023-06-09 DIAGNOSIS — Z881 Allergy status to other antibiotic agents status: Secondary | ICD-10-CM

## 2023-06-09 DIAGNOSIS — D631 Anemia in chronic kidney disease: Secondary | ICD-10-CM | POA: Diagnosis not present

## 2023-06-09 DIAGNOSIS — I130 Hypertensive heart and chronic kidney disease with heart failure and stage 1 through stage 4 chronic kidney disease, or unspecified chronic kidney disease: Secondary | ICD-10-CM | POA: Diagnosis present

## 2023-06-09 DIAGNOSIS — Z91041 Radiographic dye allergy status: Secondary | ICD-10-CM

## 2023-06-09 DIAGNOSIS — R600 Localized edema: Secondary | ICD-10-CM | POA: Diagnosis present

## 2023-06-09 DIAGNOSIS — Z8042 Family history of malignant neoplasm of prostate: Secondary | ICD-10-CM

## 2023-06-09 DIAGNOSIS — J690 Pneumonitis due to inhalation of food and vomit: Secondary | ICD-10-CM | POA: Diagnosis not present

## 2023-06-09 DIAGNOSIS — J4489 Other specified chronic obstructive pulmonary disease: Secondary | ICD-10-CM | POA: Diagnosis present

## 2023-06-09 DIAGNOSIS — I959 Hypotension, unspecified: Secondary | ICD-10-CM | POA: Diagnosis not present

## 2023-06-09 DIAGNOSIS — W19XXXD Unspecified fall, subsequent encounter: Secondary | ICD-10-CM | POA: Diagnosis present

## 2023-06-09 DIAGNOSIS — Z7951 Long term (current) use of inhaled steroids: Secondary | ICD-10-CM

## 2023-06-09 DIAGNOSIS — G4733 Obstructive sleep apnea (adult) (pediatric): Secondary | ICD-10-CM | POA: Diagnosis present

## 2023-06-09 DIAGNOSIS — Z7902 Long term (current) use of antithrombotics/antiplatelets: Secondary | ICD-10-CM

## 2023-06-09 DIAGNOSIS — E11649 Type 2 diabetes mellitus with hypoglycemia without coma: Secondary | ICD-10-CM | POA: Diagnosis not present

## 2023-06-09 DIAGNOSIS — L89152 Pressure ulcer of sacral region, stage 2: Secondary | ICD-10-CM | POA: Diagnosis not present

## 2023-06-09 DIAGNOSIS — Z886 Allergy status to analgesic agent status: Secondary | ICD-10-CM

## 2023-06-09 DIAGNOSIS — S72142A Displaced intertrochanteric fracture of left femur, initial encounter for closed fracture: Secondary | ICD-10-CM | POA: Diagnosis not present

## 2023-06-09 DIAGNOSIS — Z888 Allergy status to other drugs, medicaments and biological substances status: Secondary | ICD-10-CM

## 2023-06-09 DIAGNOSIS — U071 COVID-19: Secondary | ICD-10-CM | POA: Diagnosis not present

## 2023-06-09 DIAGNOSIS — S72142D Displaced intertrochanteric fracture of left femur, subsequent encounter for closed fracture with routine healing: Secondary | ICD-10-CM | POA: Diagnosis not present

## 2023-06-09 DIAGNOSIS — Z8616 Personal history of COVID-19: Secondary | ICD-10-CM | POA: Diagnosis not present

## 2023-06-09 LAB — GLUCOSE, CAPILLARY: Glucose-Capillary: 258 mg/dL — ABNORMAL HIGH (ref 70–99)

## 2023-06-09 MED ORDER — MORPHINE SULFATE (PF) 2 MG/ML IV SOLN: 0.5000 mg | INTRAVENOUS | Status: DC | PRN

## 2023-06-09 MED ORDER — HYDROCODONE-ACETAMINOPHEN 5-325 MG PO TABS
1.0000 | ORAL_TABLET | Freq: Four times a day (QID) | ORAL | Status: DC | PRN
  Administered 2023-06-09: 2 via ORAL
  Filled 2023-06-09: qty 2

## 2023-06-09 MED ORDER — CEFAZOLIN SODIUM-DEXTROSE 2-4 GM/100ML-% IV SOLN
2.0000 g | Freq: Three times a day (TID) | INTRAVENOUS | Status: DC
  Administered 2023-06-10 – 2023-06-12 (×7): 2 g via INTRAVENOUS
  Filled 2023-06-09 (×7): qty 100

## 2023-06-09 MED ORDER — HYDROXYZINE HCL 25 MG PO TABS
25.0000 mg | ORAL_TABLET | Freq: Every day | ORAL | Status: DC | PRN
  Administered 2023-06-10 – 2023-06-15 (×9): 25 mg via ORAL
  Filled 2023-06-09 (×11): qty 1

## 2023-06-09 MED ORDER — INSULIN ASPART 100 UNIT/ML IJ SOLN
0.0000 [IU] | INTRAMUSCULAR | Status: DC
  Administered 2023-06-10: 9 [IU] via SUBCUTANEOUS
  Administered 2023-06-10: 5 [IU] via SUBCUTANEOUS
  Administered 2023-06-10: 9 [IU] via SUBCUTANEOUS

## 2023-06-09 MED ORDER — MORPHINE SULFATE (PF) 2 MG/ML IV SOLN
1.0000 mg | INTRAVENOUS | Status: DC | PRN
  Administered 2023-06-10 (×2): 2 mg via INTRAVENOUS
  Filled 2023-06-09 (×2): qty 1

## 2023-06-09 MED ORDER — INSULIN GLARGINE-YFGN 100 UNIT/ML ~~LOC~~ SOLN
20.0000 [IU] | Freq: Every day | SUBCUTANEOUS | Status: DC
  Administered 2023-06-09 – 2023-06-11 (×3): 20 [IU] via SUBCUTANEOUS
  Filled 2023-06-09 (×4): qty 0.2

## 2023-06-09 NOTE — H&P (Signed)
History and Physical    Patient: Dylan Bruce OAC:[id number] DOB: Jun 09, 1946 DOA: 06/09/2023 DOS: the patient was seen and examined on 06/09/2023 PCP: Lindwood Qua, MD  Patient coming from: Outside Hospital  Chief Complaint: Hip fx HPI: Dylan Bruce is a 77 y.o. male with medical history significant of TAVR, DM, HTN.   Patient presented the day prior to the ED at [facility], after a fall following PT session that he was at for frequent falls.  At that time CT head did not show any acute abnormality, x-ray showed left rib fracture, femur x-ray showed no acute abnormality.   Patient returns to ED today after continued left hip pain and rib pain.  CT today confirmed intertrochanteric left hip fracture.  Orthopedics here consulted based on patient's prior treatment and preference and accepted patient to be seen for surgical intervention at [facility] with hospitalist to admit.   Review of Systems: As mentioned in the history of present illness. All other systems reviewed and are negative. 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 Clifton James and Dr. Delia Chimes   SLEEP APNEA    SPINA BIFIDA    Past Surgical History:  Procedure Laterality Date   BACK SURGERY     CARPAL TUNNEL RELEASE Left 08/21/2019   Procedure: CARPAL TUNNEL RELEASE;  Surgeon: Cindee Salt, MD;  Location: [facility];  Service: Orthopedics;  Laterality: Left;  AXILLARY BLOCK   CORONARY ARTERY BYPASS GRAFT     INTRAOPERATIVE TRANSTHORACIC ECHOCARDIOGRAM N/A 02/21/2023   Procedure: INTRAOPERATIVE TRANSTHORACIC ECHOCARDIOGRAM;  Surgeon: Kathleene Hazel, MD;  Location: [facility];  Service: Open Heart Surgery;  Laterality:  N/A;   JOINT REPLACEMENT Left    shoulder   LEFT HEART CATH AND CORS/GRAFTS ANGIOGRAPHY N/A 06/18/2018   Procedure: LEFT HEART CATH AND CORS/GRAFTS ANGIOGRAPHY;  Surgeon: Runell Gess, MD;  Location: [facility];  Service: Cardiovascular;  Laterality: N/A;   RIGHT/LEFT HEART CATH AND CORONARY/GRAFT ANGIOGRAPHY N/A 01/11/2023   Procedure: RIGHT/LEFT HEART CATH AND CORONARY/GRAFT ANGIOGRAPHY;  Surgeon: Kathleene Hazel, MD;  Location: [facility];  Service: Cardiovascular;  Laterality: N/A;   TRANSCATHETER AORTIC VALVE REPLACEMENT, TRANSFEMORAL Left 02/21/2023   Procedure: Transcatheter Aortic Valve Replacement, Transfemoral;  Surgeon: Kathleene Hazel, MD;  Location: [facility];  Service: Open Heart Surgery;  Laterality: Left;   TRIGGER FINGER RELEASE Left 08/21/2019   Procedure: RELEASE TRIGGER LEFT SMALL FINGER LEFT INDEX;  Surgeon: Cindee Salt, MD;  Location: [facility];  Service: Orthopedics;  Laterality: Left;   ULNAR NERVE TRANSPOSITION Left 08/21/2019   Procedure: DECOMPRESSION WITH ULNAR NERVE LEFT CUBITAL TUNNEL ULNAR;  Surgeon: Cindee Salt, MD;  Location: [facility];  Service: Orthopedics;  Laterality: Left;   Social History:  reports that he has never smoked. He has never used smokeless tobacco. He reports that he does not currently use alcohol. He reports that he does not use drugs.  Allergies  Allergen Reactions   Cefuroxime Diarrhea and Nausea And Vomiting    Severe. Can take Ancef 2g IV on 02/21/2023 without reporting  any issue   Ibuprofen Anaphylaxis and Shortness Of Breath        Iodinated Contrast Media Shortness Of Breath and Other (See Comments)    Pt developed burning of face and SOB after 02/06/2023 CT scan    Cefpodoxime Rash   Ciprofloxacin Rash   Doxycycline Rash   Sulfamethoxazole-Trimethoprim Rash   Fluocinolone Other (See Comments)    Unknown reaction   Probenecid Other (See Comments)     Unknown reaction   Allopurinol Rash   Azithromycin Rash   Sulfa Antibiotics Rash    Family History  Problem Relation Age of Onset   Valvular heart disease Father    Prostate cancer Father     Prior to Admission medications   Medication Sig Start Date End Date Taking? Authorizing Provider  albuterol (PROVENTIL HFA;VENTOLIN HFA) 108 (90 Base) MCG/ACT inhaler Inhale 2 puffs into the lungs every 4 (four) hours as needed for wheezing. 04/16/17   [provider]  amoxicillin (AMOXIL) 500 MG tablet Prophylaxis for dental procedure. Take 2 grams (4x 500mg  tablets) 1 hour before procedure. 02/26/23   Corrin Parker, PA-C  apixaban (ELIQUIS) 5 MG TABS tablet Take 1 tablet (5 mg total) by mouth 2 (two) times daily. 02/22/23   Filbert Schilder, NP  atorvastatin (LIPITOR) 80 MG tablet Take 1 tablet (80 mg total) by mouth daily. 01/12/23 02/19/24  Arrien, York Ram, MD  budesonide-formoterol [facility]) 160-4.5 MCG/ACT inhaler Inhale 2 puffs into the lungs in the morning and at bedtime. 02/09/23   [provider]  clobetasol cream (TEMOVATE) 0.05 % Apply 1 application topically daily as needed (rash).  08/29/13   [provider]  clopidogrel (PLAVIX) 75 MG tablet Take 1 tablet (75 mg total) by mouth daily. 06/18/18   Kroeger, Dot Lanes M., PA-C  COSENTYX SENSOREADY, 300 MG, 150 MG/ML SOAJ Inject 300 mg into the skin every 30 (thirty) days. 04/26/18   [provider]  cyclobenzaprine (FLEXERIL) 10 MG tablet Take 10 mg by mouth at bedtime.  01/11/15   [provider]  desonide (DESOWEN) 0.05 % cream Apply 1 application topically daily as needed (rash).     [provider]  docusate sodium (COLACE) 100 MG capsule Take 100 mg by mouth daily.     [provider]  DOXYCYCLINE CALCIUM PO Take by mouth in the morning and at bedtime.    [provider]  esomeprazole (NEXIUM) 40 MG capsule Take 40 mg by mouth daily before breakfast.    [provider]  ezetimibe (ZETIA) 10 MG tablet Take 10 mg by mouth daily. 07/13/17   [provider]  fluticasone (FLONASE) 50 MCG/ACT nasal spray Place 1 spray into both nostrils in the morning and at bedtime. 01/31/23   [provider]  hydrocortisone (ANUCORT-HC) 25 MG suppository Place 1 suppository rectally 2 (two) times daily as needed for hemorrhoids. 01/12/17   [provider]  hydrOXYzine (VISTARIL) 25 MG capsule Take 25 mg by mouth daily as needed for itching (sleep). 01/15/23   [provider]  insulin lispro (HUMALOG KWIKPEN) 100 UNIT/ML KiwkPen Inject 18-24 Units into the skin 3 (three) times daily with meals. 07/16/17   [provider]  isosorbide mononitrate (IMDUR) 30 MG 24 hr tablet Take 1 tablet (30 mg total) by mouth daily. Please schedule appointment for refills. 01/12/23   Arrien, York Ram, MD  JARDIANCE 10 MG TABS tablet Take 10 mg by mouth daily. 02/06/23   [provider]  Nelly Laurence  100 UNIT/ML Solostar Pen Inject 22 Units into the skin at bedtime. 04/29/18   [provider]  LYRICA 100 MG capsule Take 1 capsule (100 mg total) by mouth 2 (two) times daily. 01/12/23   Arrien, York Ram, MD  metFORMIN (GLUCOPHAGE) 1000 MG tablet Take 1,000 mg by mouth 2 (two) times daily. 02/06/23   [provider]  mirtazapine (REMERON) 30 MG tablet Take 1 tablet by mouth at bedtime.  12/16/14   [provider]  nitroGLYCERIN (NITROSTAT) 0.4 MG SL tablet Place 1 tablet (0.4 mg total) under the tongue every 5 (five) minutes as needed for chest pain. Patient taking differently: Place 0.4 mg under the tongue daily as needed (for difficulty breathing d/t fluid in lungs). 08/14/12   Lewayne Bunting, MD  nystatin cream (MYCOSTATIN) Apply 1 Application topically 2 (two) times daily. 01/20/23   [provider]  nystatin-triamcinolone (MYCOLOG II) cream Apply 1 application topically daily as needed (rash).      [provider]  oxyCODONE-acetaminophen (PERCOCET) 10-325 MG per tablet Take 1-2 tablets by mouth 2 (two) times daily as needed for pain.     [provider]  polycarbophil (FIBERCON) 625 MG tablet Take 625 mg by mouth daily.    [provider]  SOOLANTRA 1 % CREA Apply 1 application  topically daily. 06/01/18   [provider]  tamsulosin (FLOMAX) 0.4 MG CAPS capsule Take 0.4 capsules by mouth daily. 04/23/18   [provider]  tiZANidine (ZANAFLEX) 4 MG tablet Take 4 mg by mouth at bedtime. 01/30/23   [provider]  Torsemide 40 MG TABS Take 40 mg by mouth daily. 02/22/23   Georgie Chard D, NP  triamcinolone cream (KENALOG) 0.1 % Apply 1 Application topically 2 (two) times daily as needed (for itching). 02/12/23   [provider]  ULORIC 40 MG tablet Take 1 tablet by mouth Daily. 06/04/12   [provider]  VIIBRYD 40 MG TABS Take 40 mg by mouth Daily.  06/12/12   [provider]    Physical Exam: Vitals:   06/09/23 2128 06/09/23 2140  BP: (!) 186/88   Pulse: 69   Resp: 20   Temp: 98.6 F (37 C)   TempSrc: Oral   SpO2: 98%   Weight:  90.2 kg  Height:  6\' 2"  (1.88 m)   Constitutional: NAD, calm, comfortable Respiratory: clear to auscultation bilaterally, no wheezing, no crackles. Normal respiratory effort. No accessory muscle use.  Cardiovascular: Regular rate and rhythm, no murmurs / rubs / gallops. No extremity edema. 2+ pedal pulses. No carotid bruits.  Abdomen: no tenderness, no masses palpated. No hepatosplenomegaly. Bowel sounds positive.  Musculoskeletal: L hip TTP Skin: Skin tear to L elbow and upper arm, sutures in place where squamous cell skin cancer reportedly removed by pts dermatologist on L forearm.  Has venous stasis changes of BLE shins with a small ulcer on the R shin.  Mild increased erythema around ulcer and yellowish discharge from ulcer.  No crepitus, no pain surrounding ulcer but seems to  have sensation loss of BLE below knees suspicious for diabetic neuropathy.  Has R foot pre-ulcerative callus without surrounding erythema, edema, exudate, etc. Neurologic: CN 2-12 grossly intact. DTR normal. Strength 5/5 in all 4.  Psychiatric: Normal judgment and insight. Alert and oriented x 3. Normal mood.   Data Reviewed:  Results are pending, will review when available.  Assessment and Plan: * Closed left hip fracture, initial encounter (HCC) Hip fx pathway  Npo after MN Surgical repair planned for 10am Pain control per hip fx pathway. Still having breakthrough pain though so increasing PRN morphine to 1-2mg  Q2H PRN Use cont pulse ox due to report of oversedation at OSH earlier.  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 Does look like he has some yellowish exudate coming from the ulcer. No crepitus, no pain with palpation; however, does have loss of sensation over bilateral lower legs (likely significant degree of diabetic neuropathy present). No systemic signs of infection / SIRS. Starting Ancef for possible mild cellulitis at this point Check MRSA pcr nares Wound culture Wound care consult for this and other wounds as described  Fracture of multiple ribs of left side Incentive spirometry. No obvious complicating factors on todays CXR at [facility] (radiologist didn't even call rib fractures on read).  Squamous cell carcinoma of skin Removed by Pts dermatologist from patients R forearm.  Supposed to have the forearm sutures removed on Monday, but suspect he will still be in hospital at that time.  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 2d echo 05/04/23 = nl EF, and normally functioning TAVR  Diabetic ulcer of right foot (HCC) More a pre-ulcerative callus at this stage, no surrounding erythema, exudate, edema, crepitus, or other  finding on foot to suggest localized infection. Needs podiatry follow up  PAF (paroxysmal atrial fibrillation) (HCC) Holding eliquis for hip fx repair, resume following repair.  Longer term however; may need to have a more in-depth discussion about risks / benefits given that he has had multiple mechanical falls recently. Doesn't appear to be on any rate or rhythm control meds (med rec pending still).  Chronic kidney disease, stage 3b (HCC) Checking CMP with admit labs.  Essential hypertension Med rec pending, plan to continue home BP meds once completed.  Type 2 diabetes mellitus with hyperlipidemia (HCC) Hold home oral hypoglycemics Semglee 20u at bedtime Sensitive SSI Q4H while NPO      Advance Care Planning:   Code Status: Full Code  Consults: Ortho  Family Communication: No family in room  Severity of Illness: The appropriate patient status for this patient is INPATIENT. Inpatient status is judged to be reasonable and necessary in order to provide the required intensity of service to ensure the patient's safety. The patient's presenting symptoms, physical exam findings, and initial radiographic and laboratory data in the context of their chronic comorbidities is felt to place them at high risk for further clinical deterioration. Furthermore, it is not anticipated that the patient will be medically stable for discharge from the hospital within 2 midnights of admission.   * I certify that at the point of admission it is my clinical judgment that the patient will require inpatient hospital care spanning beyond 2 midnights from the point of admission due to high intensity of service, high risk for further deterioration and high frequency of surveillance required.*  Author: Hillary Bow., DO 06/09/2023 10:05 PM  For on call review [email or web address].

## 2023-06-09 NOTE — Assessment & Plan Note (Addendum)
Hip fx pathway Npo after MN Surgical repair planned for 10am Pain control per hip fx pathway. Still having breakthrough pain though so increasing PRN morphine to 1-2mg  Q2H PRN Use cont pulse ox due to report of oversedation at OSH earlier.

## 2023-06-09 NOTE — Progress Notes (Signed)
Patient being transferred to [facility] hospital from [facility] for surgical fixation of left intertrochanteric hip fracture. He will need repeat x-ray of left hip when he arrives. Plan for left hip IM nail with Dr. [name] tomorrow morning. Please keep NPO after midnight.   Janine Ores, PA-C

## 2023-06-09 NOTE — Assessment & Plan Note (Signed)
Med rec pending, plan to continue home BP meds once completed.

## 2023-06-09 NOTE — Progress Notes (Signed)
Plan of Care Note for accepted transfer   Patient: Dylan Bruce MRN: [id number]   DOA: (Not on file)  Facility requesting transfer: [facility] Requesting Provider: EDP Reason for transfer: Hip fracture Facility course: Patient presented the day prior to the ED after a fall following PT session that he was at for frequent falls.  At that time CT head did not show any acute abnormality, x-ray showed left rib fracture, femur x-ray showed no acute abnormality.  Patient returns today after continued left hip pain and rib pain.  CT confirmed intertrochanteric left hip fracture.  Orthopedics here consulted based on patient's prior treatment and preference and accepted patient to be seen for surgical intervention at [facility] with hospitalist to admit.  Patient has baseline opiate use for chronic pain and was intermittently drowsy after requiring additional opiates for breakthrough pain from this fracture.  Will monitor/except to telemetry unit and will need continuous pulse ox.  Plan of care: The patient is accepted for admission to Telemetry unit, at [facility]..   Author: Synetta Fail, MD 06/09/2023  Check [email or web address] for on-call coverage.  Nursing staff, Please call [facility] Admits & Consults System-Wide number on Amion as soon as patient's arrival, so appropriate admitting provider can evaluate the pt.

## 2023-06-09 NOTE — Assessment & Plan Note (Addendum)
Holding eliquis for hip fx repair, resume following repair.  Longer term however; may need to have a more in-depth discussion about risks / benefits given that he has had multiple mechanical falls recently. Doesn't appear to be on any rate or rhythm control meds (med rec pending still).

## 2023-06-09 NOTE — Assessment & Plan Note (Addendum)
Slight amount of increased surrounding erythema per patient more than baseline. Does look like he has some yellowish exudate coming from the ulcer. No crepitus, no pain with palpation; however, does have loss of sensation over bilateral lower legs (likely significant degree of diabetic neuropathy present). No systemic signs of infection / SIRS. Starting Ancef for possible mild cellulitis at this point Check MRSA pcr nares Wound culture Wound care consult for this and other wounds as described

## 2023-06-09 NOTE — Assessment & Plan Note (Signed)
Hold home oral hypoglycemics Semglee 20u at bedtime Sensitive SSI Q4H while NPO

## 2023-06-09 NOTE — Assessment & Plan Note (Signed)
More a pre-ulcerative callus at this stage, no surrounding erythema, exudate, edema, crepitus, or other finding on foot to suggest localized infection. Needs podiatry follow up

## 2023-06-09 NOTE — Assessment & Plan Note (Addendum)
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 2d echo 05/04/23 = nl EF, and normally functioning TAVR

## 2023-06-09 NOTE — Assessment & Plan Note (Signed)
Removed by Pts dermatologist from patients R forearm.  Supposed to have the forearm sutures removed on Monday, but suspect he will still be in hospital at that time.

## 2023-06-09 NOTE — Assessment & Plan Note (Addendum)
Incentive spirometry. No obvious complicating factors on todays CXR at [facility] (radiologist didn't even call rib fractures on read).

## 2023-06-09 NOTE — Assessment & Plan Note (Signed)
Checking CMP with admit labs.

## 2023-06-10 ENCOUNTER — Inpatient Hospital Stay (HOSPITAL_COMMUNITY): Payer: Commercial Managed Care - PPO

## 2023-06-10 ENCOUNTER — Inpatient Hospital Stay (HOSPITAL_COMMUNITY): Payer: Commercial Managed Care - PPO | Admitting: Certified Registered Nurse Anesthetist

## 2023-06-10 ENCOUNTER — Encounter (HOSPITAL_COMMUNITY)
Admission: AD | Disposition: A | Discharge status: Rehabilitation Facility | Payer: Self-pay | Source: Other Acute Inpatient Hospital | Attending: Family Medicine

## 2023-06-10 ENCOUNTER — Other Ambulatory Visit: Payer: Self-pay

## 2023-06-10 ENCOUNTER — Inpatient Hospital Stay: Admit: 2023-06-10 | Payer: Medicare Other

## 2023-06-10 ENCOUNTER — Encounter (HOSPITAL_COMMUNITY): Payer: Self-pay | Admitting: Internal Medicine

## 2023-06-10 DIAGNOSIS — I110 Hypertensive heart disease with heart failure: Secondary | ICD-10-CM | POA: Diagnosis not present

## 2023-06-10 DIAGNOSIS — S72142A Displaced intertrochanteric fracture of left femur, initial encounter for closed fracture: Secondary | ICD-10-CM

## 2023-06-10 DIAGNOSIS — S72002A Fracture of unspecified part of neck of left femur, initial encounter for closed fracture: Secondary | ICD-10-CM

## 2023-06-10 DIAGNOSIS — I2510 Atherosclerotic heart disease of native coronary artery without angina pectoris: Secondary | ICD-10-CM

## 2023-06-10 DIAGNOSIS — I5043 Acute on chronic combined systolic (congestive) and diastolic (congestive) heart failure: Secondary | ICD-10-CM | POA: Diagnosis not present

## 2023-06-10 HISTORY — PX: INTRAMEDULLARY (IM) NAIL INTERTROCHANTERIC: SHX5875

## 2023-06-10 LAB — CBC
HCT: 37.4 % — ABNORMAL LOW (ref 39.0–52.0)
Hemoglobin: 12 g/dL — ABNORMAL LOW (ref 13.0–17.0)
MCH: 25.5 pg — ABNORMAL LOW (ref 26.0–34.0)
MCHC: 32.1 g/dL (ref 30.0–36.0)
MCV: 79.4 fL — ABNORMAL LOW (ref 80.0–100.0)
Platelets: 273 10*3/uL (ref 150–400)
RBC: 4.71 MIL/uL (ref 4.22–5.81)
RDW: 18.6 % — ABNORMAL HIGH (ref 11.5–15.5)
WBC: 10.3 10*3/uL (ref 4.0–10.5)
nRBC: 0 % (ref 0.0–0.2)

## 2023-06-10 LAB — COMPREHENSIVE METABOLIC PANEL WITH GFR
ALT: 30 U/L (ref 0–44)
AST: 24 U/L (ref 15–41)
Albumin: 2.6 g/dL — ABNORMAL LOW (ref 3.5–5.0)
Alkaline Phosphatase: 110 U/L (ref 38–126)
Anion gap: 10 (ref 5–15)
BUN: 26 mg/dL — ABNORMAL HIGH (ref 8–23)
CO2: 24 mmol/L (ref 22–32)
Calcium: 8.8 mg/dL — ABNORMAL LOW (ref 8.9–10.3)
Chloride: 104 mmol/L (ref 98–111)
Creatinine, Ser: 0.92 mg/dL (ref 0.61–1.24)
GFR, Estimated: >60 mL/min (ref >60)
Glucose, Bld: 235 mg/dL — ABNORMAL HIGH (ref 70–99)
Potassium: 3.8 mmol/L (ref 3.5–5.1)
Sodium: 138 mmol/L (ref 135–145)
Total Bilirubin: 0.6 mg/dL (ref 0.3–1.2)
Total Protein: 6.4 g/dL — ABNORMAL LOW (ref 6.5–8.1)

## 2023-06-10 LAB — MRSA NEXT GEN BY PCR, NASAL: MRSA by PCR Next Gen: NOT DETECTED

## 2023-06-10 LAB — GLUCOSE, CAPILLARY
Glucose-Capillary: 177 mg/dL — ABNORMAL HIGH (ref 70–99)
Glucose-Capillary: 191 mg/dL — ABNORMAL HIGH (ref 70–99)
Glucose-Capillary: 226 mg/dL — ABNORMAL HIGH (ref 70–99)
Glucose-Capillary: 288 mg/dL — ABNORMAL HIGH (ref 70–99)
Glucose-Capillary: 360 mg/dL — ABNORMAL HIGH (ref 70–99)
Glucose-Capillary: 364 mg/dL — ABNORMAL HIGH (ref 70–99)
Glucose-Capillary: 379 mg/dL — ABNORMAL HIGH (ref 70–99)
Glucose-Capillary: 406 mg/dL — ABNORMAL HIGH (ref 70–99)

## 2023-06-10 LAB — PREALBUMIN: Prealbumin: 12 mg/dL — ABNORMAL LOW (ref 18–38)

## 2023-06-10 LAB — TYPE AND SCREEN
ABO/RH(D): A POS
Antibody Screen: NEGATIVE

## 2023-06-10 LAB — C-REACTIVE PROTEIN: CRP: 16.5 mg/dL — ABNORMAL HIGH (ref <1.0)

## 2023-06-10 LAB — SEDIMENTATION RATE: Sed Rate: 34 mm/h — ABNORMAL HIGH (ref 0–16)

## 2023-06-10 SURGERY — FIXATION, FRACTURE, INTERTROCHANTERIC, WITH INTRAMEDULLARY ROD
Anesthesia: General | Laterality: Left

## 2023-06-10 MED ORDER — POTASSIUM CHLORIDE IN NACL 20-0.9 MEQ/L-% IV SOLN
INTRAVENOUS | Status: DC
  Filled 2023-06-10 (×2): qty 1000

## 2023-06-10 MED ORDER — PANTOPRAZOLE SODIUM 40 MG PO TBEC
40.0000 mg | DELAYED_RELEASE_TABLET | Freq: Every day | ORAL | Status: DC
  Administered 2023-06-11 – 2023-06-16 (×6): 40 mg via ORAL
  Filled 2023-06-10 (×6): qty 1

## 2023-06-10 MED ORDER — FENTANYL CITRATE (PF) 250 MCG/5ML IJ SOLN
INTRAMUSCULAR | Status: DC | PRN
  Administered 2023-06-10 (×3): 50 ug via INTRAVENOUS
  Administered 2023-06-10: 25 ug via INTRAVENOUS

## 2023-06-10 MED ORDER — PROPOFOL 10 MG/ML IV BOLUS
INTRAVENOUS | Status: DC | PRN
Start: 2023-06-10 — End: 2023-06-10
  Administered 2023-06-10: 80 mg via INTRAVENOUS
  Administered 2023-06-10: 40 mg via INTRAVENOUS

## 2023-06-10 MED ORDER — ROCURONIUM BROMIDE 10 MG/ML (PF) SYRINGE
PREFILLED_SYRINGE | INTRAVENOUS | Status: DC | PRN
  Administered 2023-06-10: 50 mg via INTRAVENOUS

## 2023-06-10 MED ORDER — TAMSULOSIN HCL 0.4 MG PO CAPS
0.4000 mg | ORAL_CAPSULE | Freq: Every day | ORAL | Status: DC
  Administered 2023-06-11 – 2023-06-16 (×6): 0.4 mg via ORAL
  Filled 2023-06-10 (×6): qty 1

## 2023-06-10 MED ORDER — ACETAMINOPHEN 500 MG PO TABS
500.0000 mg | ORAL_TABLET | Freq: Four times a day (QID) | ORAL | Status: AC
  Administered 2023-06-10 – 2023-06-11 (×3): 500 mg via ORAL
  Filled 2023-06-10 (×5): qty 1

## 2023-06-10 MED ORDER — ONDANSETRON HCL 4 MG/2ML IJ SOLN
INTRAMUSCULAR | Status: AC
  Filled 2023-06-10: qty 2

## 2023-06-10 MED ORDER — BISACODYL 10 MG RE SUPP: 10.0000 mg | Freq: Every day | RECTAL | Status: DC | PRN

## 2023-06-10 MED ORDER — EZETIMIBE 10 MG PO TABS
10.0000 mg | ORAL_TABLET | Freq: Every day | ORAL | Status: DC
  Administered 2023-06-11 – 2023-06-16 (×6): 10 mg via ORAL
  Filled 2023-06-10 (×6): qty 1

## 2023-06-10 MED ORDER — FLUTICASONE PROPIONATE 50 MCG/ACT NA SUSP
1.0000 | Freq: Every day | NASAL | Status: DC
  Administered 2023-06-11 – 2023-06-15 (×4): 1 via NASAL
  Filled 2023-06-10: qty 16

## 2023-06-10 MED ORDER — ALUM & MAG HYDROXIDE-SIMETH 200-200-20 MG/5ML PO SUSP
30.0000 mL | ORAL | Status: DC | PRN
  Administered 2023-06-12 – 2023-06-13 (×2): 30 mL via ORAL
  Filled 2023-06-10 (×2): qty 30

## 2023-06-10 MED ORDER — EMPAGLIFLOZIN 10 MG PO TABS
10.0000 mg | ORAL_TABLET | Freq: Every day | ORAL | Status: DC
  Administered 2023-06-11: 10 mg via ORAL
  Filled 2023-06-10: qty 1

## 2023-06-10 MED ORDER — POVIDONE-IODINE 10 % EX SWAB: 2.0000 | Freq: Once | CUTANEOUS | Status: DC

## 2023-06-10 MED ORDER — ACETAMINOPHEN 500 MG PO TABS: 1000.0000 mg | ORAL_TABLET | Freq: Once | ORAL | Status: DC

## 2023-06-10 MED ORDER — FENTANYL CITRATE (PF) 250 MCG/5ML IJ SOLN
INTRAMUSCULAR | Status: AC
  Filled 2023-06-10: qty 5

## 2023-06-10 MED ORDER — ONDANSETRON HCL 4 MG/2ML IJ SOLN
INTRAMUSCULAR | Status: DC | PRN
Start: 2023-06-10 — End: 2023-06-10
  Administered 2023-06-10: 4 mg via INTRAVENOUS

## 2023-06-10 MED ORDER — FENTANYL CITRATE (PF) 100 MCG/2ML IJ SOLN
INTRAMUSCULAR | Status: AC
  Filled 2023-06-10: qty 2

## 2023-06-10 MED ORDER — DEXAMETHASONE SODIUM PHOSPHATE 10 MG/ML IJ SOLN
INTRAMUSCULAR | Status: AC
  Filled 2023-06-10: qty 1

## 2023-06-10 MED ORDER — LIDOCAINE 2% (20 MG/ML) 5 ML SYRINGE
INTRAMUSCULAR | Status: DC | PRN
  Administered 2023-06-10: 60 mg via INTRAVENOUS

## 2023-06-10 MED ORDER — SENNA 8.6 MG PO TABS
1.0000 | ORAL_TABLET | Freq: Two times a day (BID) | ORAL | Status: DC
  Administered 2023-06-10 – 2023-06-16 (×12): 8.6 mg via ORAL
  Filled 2023-06-10 (×12): qty 1

## 2023-06-10 MED ORDER — MAGNESIUM OXIDE -MG SUPPLEMENT 400 (240 MG) MG PO TABS
400.0000 mg | ORAL_TABLET | Freq: Every day | ORAL | Status: DC
  Administered 2023-06-11 – 2023-06-16 (×6): 400 mg via ORAL
  Filled 2023-06-10 (×6): qty 1

## 2023-06-10 MED ORDER — LACTATED RINGERS IV SOLN: INTRAVENOUS | Status: DC

## 2023-06-10 MED ORDER — LIDOCAINE 2% (20 MG/ML) 5 ML SYRINGE
INTRAMUSCULAR | Status: AC
  Filled 2023-06-10: qty 5

## 2023-06-10 MED ORDER — ACETAMINOPHEN 10 MG/ML IV SOLN
INTRAVENOUS | Status: DC | PRN
Start: 2023-06-10 — End: 2023-06-10
  Administered 2023-06-10: 1000 mg via INTRAVENOUS

## 2023-06-10 MED ORDER — ORAL CARE MOUTH RINSE: 15.0000 mL | Freq: Once | OROMUCOSAL | Status: AC

## 2023-06-10 MED ORDER — CHLORHEXIDINE GLUCONATE 4 % EX SOLN: 60.0000 mL | Freq: Once | CUTANEOUS | Status: DC

## 2023-06-10 MED ORDER — ACETAMINOPHEN 325 MG PO TABS: 325.0000 mg | ORAL_TABLET | Freq: Four times a day (QID) | ORAL | Status: DC | PRN

## 2023-06-10 MED ORDER — ISOSORBIDE MONONITRATE ER 30 MG PO TB24
30.0000 mg | ORAL_TABLET | Freq: Every day | ORAL | Status: DC
  Administered 2023-06-11 – 2023-06-16 (×6): 30 mg via ORAL
  Filled 2023-06-10 (×6): qty 1

## 2023-06-10 MED ORDER — HYDROCODONE-ACETAMINOPHEN 7.5-325 MG PO TABS
1.0000 | ORAL_TABLET | ORAL | Status: DC | PRN
  Administered 2023-06-10 – 2023-06-11 (×2): 1 via ORAL
  Filled 2023-06-10: qty 2
  Filled 2023-06-10 (×2): qty 1

## 2023-06-10 MED ORDER — MAGNESIUM CITRATE PO SOLN: 1.0000 | Freq: Once | ORAL | Status: DC | PRN

## 2023-06-10 MED ORDER — POLYETHYLENE GLYCOL 3350 17 G PO PACK
17.0000 g | PACK | Freq: Every day | ORAL | Status: DC | PRN
  Administered 2023-06-15: 17 g via ORAL
  Filled 2023-06-10 (×2): qty 1

## 2023-06-10 MED ORDER — FENTANYL CITRATE (PF) 100 MCG/2ML IJ SOLN
25.0000 ug | INTRAMUSCULAR | Status: DC | PRN
  Administered 2023-06-10: 25 ug via INTRAVENOUS
  Administered 2023-06-10: 50 ug via INTRAVENOUS
  Administered 2023-06-10: 25 ug via INTRAVENOUS

## 2023-06-10 MED ORDER — INSULIN ASPART 100 UNIT/ML IJ SOLN
0.0000 [IU] | INTRAMUSCULAR | Status: DC
  Administered 2023-06-10: 20 [IU] via SUBCUTANEOUS
  Administered 2023-06-11: 4 [IU] via SUBCUTANEOUS
  Administered 2023-06-11: 20 [IU] via SUBCUTANEOUS

## 2023-06-10 MED ORDER — TRANEXAMIC ACID-NACL 1000-0.7 MG/100ML-% IV SOLN
1000.0000 mg | INTRAVENOUS | Status: AC
  Administered 2023-06-10: 1000 mg via INTRAVENOUS

## 2023-06-10 MED ORDER — DEXAMETHASONE SODIUM PHOSPHATE 10 MG/ML IJ SOLN
INTRAMUSCULAR | Status: DC | PRN
  Administered 2023-06-10: 5 mg via INTRAVENOUS

## 2023-06-10 MED ORDER — PROPOFOL 10 MG/ML IV BOLUS
INTRAVENOUS | Status: AC
  Filled 2023-06-10: qty 20

## 2023-06-10 MED ORDER — ORAL CARE MOUTH RINSE: 15.0000 mL | OROMUCOSAL | Status: DC | PRN

## 2023-06-10 MED ORDER — CHLORHEXIDINE GLUCONATE 0.12 % MT SOLN: 15.0000 mL | Freq: Once | OROMUCOSAL | Status: AC

## 2023-06-10 MED ORDER — ROCURONIUM BROMIDE 10 MG/ML (PF) SYRINGE
PREFILLED_SYRINGE | INTRAVENOUS | Status: AC
  Filled 2023-06-10: qty 10

## 2023-06-10 MED ORDER — APIXABAN 5 MG PO TABS
5.0000 mg | ORAL_TABLET | Freq: Two times a day (BID) | ORAL | Status: DC
  Administered 2023-06-10 – 2023-06-16 (×12): 5 mg via ORAL
  Filled 2023-06-10 (×12): qty 1

## 2023-06-10 MED ORDER — SUGAMMADEX SODIUM 200 MG/2ML IV SOLN
INTRAVENOUS | Status: DC | PRN
  Administered 2023-06-10: 200 mg via INTRAVENOUS

## 2023-06-10 MED ORDER — ONDANSETRON HCL 4 MG/2ML IJ SOLN: 4.0000 mg | Freq: Four times a day (QID) | INTRAMUSCULAR | Status: DC | PRN

## 2023-06-10 MED ORDER — ONDANSETRON HCL 4 MG PO TABS: 4.0000 mg | ORAL_TABLET | Freq: Four times a day (QID) | ORAL | Status: DC | PRN

## 2023-06-10 MED ORDER — TORSEMIDE 20 MG PO TABS
20.0000 mg | ORAL_TABLET | Freq: Every day | ORAL | Status: DC
  Administered 2023-06-11 – 2023-06-16 (×6): 20 mg via ORAL
  Filled 2023-06-10 (×6): qty 1

## 2023-06-10 MED ORDER — CHLORHEXIDINE GLUCONATE 4 % EX SOLN
60.0000 mL | Freq: Once | CUTANEOUS | Status: DC
  Filled 2023-06-10: qty 60

## 2023-06-10 MED ORDER — CEFAZOLIN SODIUM-DEXTROSE 2-4 GM/100ML-% IV SOLN: 2.0000 g | INTRAVENOUS | Status: DC

## 2023-06-10 MED ORDER — MOMETASONE FURO-FORMOTEROL FUM 200-5 MCG/ACT IN AERO
2.0000 | INHALATION_SPRAY | Freq: Two times a day (BID) | RESPIRATORY_TRACT | Status: DC
  Administered 2023-06-11 – 2023-06-16 (×11): 2 via RESPIRATORY_TRACT
  Filled 2023-06-10: qty 8.8

## 2023-06-10 MED ORDER — MENTHOL 3 MG MT LOZG: 1.0000 | LOZENGE | OROMUCOSAL | Status: DC | PRN

## 2023-06-10 MED ORDER — CEFAZOLIN SODIUM-DEXTROSE 2-4 GM/100ML-% IV SOLN
2.0000 g | INTRAVENOUS | Status: AC
  Administered 2023-06-10: 2 g via INTRAVENOUS

## 2023-06-10 MED ORDER — ATORVASTATIN CALCIUM 80 MG PO TABS
80.0000 mg | ORAL_TABLET | Freq: Every day | ORAL | Status: DC
  Administered 2023-06-11 – 2023-06-16 (×6): 80 mg via ORAL
  Filled 2023-06-10 (×6): qty 1

## 2023-06-10 MED ORDER — CEFAZOLIN SODIUM-DEXTROSE 2-4 GM/100ML-% IV SOLN
INTRAVENOUS | Status: AC
  Filled 2023-06-10: qty 100

## 2023-06-10 MED ORDER — HYDROCODONE-ACETAMINOPHEN 5-325 MG PO TABS
1.0000 | ORAL_TABLET | ORAL | Status: DC | PRN
  Administered 2023-06-11: 2 via ORAL
  Filled 2023-06-10: qty 2

## 2023-06-10 MED ORDER — TRANEXAMIC ACID-NACL 1000-0.7 MG/100ML-% IV SOLN
INTRAVENOUS | Status: AC
  Filled 2023-06-10: qty 100

## 2023-06-10 MED ORDER — VILAZODONE HCL 40 MG PO TABS
40.0000 mg | ORAL_TABLET | Freq: Every day | ORAL | Status: DC
  Filled 2023-06-10: qty 1

## 2023-06-10 MED ORDER — DOCUSATE SODIUM 100 MG PO CAPS
100.0000 mg | ORAL_CAPSULE | Freq: Two times a day (BID) | ORAL | Status: DC
  Administered 2023-06-10 – 2023-06-16 (×13): 100 mg via ORAL
  Filled 2023-06-10 (×14): qty 1

## 2023-06-10 MED ORDER — FERROUS SULFATE 325 (65 FE) MG PO TABS
325.0000 mg | ORAL_TABLET | Freq: Three times a day (TID) | ORAL | Status: DC
  Administered 2023-06-10 – 2023-06-16 (×19): 325 mg via ORAL
  Filled 2023-06-10 (×19): qty 1

## 2023-06-10 MED ORDER — CHLORHEXIDINE GLUCONATE 0.12 % MT SOLN
OROMUCOSAL | Status: AC
  Administered 2023-06-10: 15 mL via OROMUCOSAL
  Filled 2023-06-10: qty 15

## 2023-06-10 MED ORDER — TRANEXAMIC ACID-NACL 1000-0.7 MG/100ML-% IV SOLN
1000.0000 mg | Freq: Once | INTRAVENOUS | Status: AC
  Administered 2023-06-10: 1000 mg via INTRAVENOUS
  Filled 2023-06-10: qty 100

## 2023-06-10 MED ORDER — VILAZODONE HCL 40 MG PO TABS: 40.0000 mg | ORAL_TABLET | Freq: Every day | ORAL | Status: DC

## 2023-06-10 MED ORDER — INSULIN ASPART 100 UNIT/ML IJ SOLN: 0.0000 [IU] | INTRAMUSCULAR | Status: DC | PRN

## 2023-06-10 MED ORDER — MORPHINE SULFATE (PF) 2 MG/ML IV SOLN
0.5000 mg | INTRAVENOUS | Status: DC | PRN
  Administered 2023-06-10: 1 mg via INTRAVENOUS
  Filled 2023-06-10: qty 1

## 2023-06-10 MED ORDER — 0.9 % SODIUM CHLORIDE (POUR BTL) OPTIME
TOPICAL | Status: DC | PRN
  Administered 2023-06-10: 1000 mL

## 2023-06-10 MED ORDER — ACETAMINOPHEN 10 MG/ML IV SOLN
INTRAVENOUS | Status: AC
  Filled 2023-06-10: qty 100

## 2023-06-10 MED ORDER — PHENOL 1.4 % MT LIQD: 1.0000 | OROMUCOSAL | Status: DC | PRN

## 2023-06-10 MED ORDER — CLOPIDOGREL BISULFATE 75 MG PO TABS
75.0000 mg | ORAL_TABLET | Freq: Every day | ORAL | Status: DC
  Administered 2023-06-11 – 2023-06-16 (×6): 75 mg via ORAL
  Filled 2023-06-10 (×6): qty 1

## 2023-06-10 SURGICAL SUPPLY — 46 items
ADH SKN CLS LQ APL DERMABOND (GAUZE/BANDAGES/DRESSINGS) ×1
APL SKNCLS STERI-STRIP NONHPOA (GAUZE/BANDAGES/DRESSINGS) ×1
BAG COUNTER SPONGE SURGICOUNT (BAG) ×1 IMPLANT
BAG SPNG CNTER NS LX DISP (BAG) ×1
BENZOIN TINCTURE PRP APPL 2/3 (GAUZE/BANDAGES/DRESSINGS) ×1 IMPLANT
BIT DRILL CANN LG 4.3MM (BIT) IMPLANT
BOOTCOVER CLEANROOM LRG (PROTECTIVE WEAR) ×2 IMPLANT
CLSR STERI-STRIP ANTIMIC 1/2X4 (GAUZE/BANDAGES/DRESSINGS) ×1 IMPLANT
COVER PERINEAL POST (MISCELLANEOUS) ×1 IMPLANT
COVER SURGICAL LIGHT HANDLE (MISCELLANEOUS) ×1 IMPLANT
DERMABOND ADVANCED .7 DNX6 (GAUZE/BANDAGES/DRESSINGS) IMPLANT
DRAPE STERI IOBAN 125X83 (DRAPES) ×1 IMPLANT
DRESSING MEPILEX FLEX 4X4 (GAUZE/BANDAGES/DRESSINGS) ×2 IMPLANT
DRILL BIT CANN LG 4.3MM (BIT) ×1
DRSG MEPILEX FLEX 4X4 (GAUZE/BANDAGES/DRESSINGS) ×2
DRSG MEPILEX POST OP 4X8 (GAUZE/BANDAGES/DRESSINGS) IMPLANT
DRSG TEGADERM 4X4.5 CHG (GAUZE/BANDAGES/DRESSINGS) IMPLANT
DURAPREP 26ML APPLICATOR (WOUND CARE) ×1 IMPLANT
ELECT CAUTERY BLADE 6.4 (BLADE) ×1 IMPLANT
ELECT REM PT RETURN 9FT ADLT (ELECTROSURGICAL) ×1
ELECTRODE REM PT RTRN 9FT ADLT (ELECTROSURGICAL) ×1 IMPLANT
EVACUATOR 1/8 PVC DRAIN (DRAIN) IMPLANT
FACESHIELD WRAPAROUND (MASK) ×2 IMPLANT
FACESHIELD WRAPAROUND OR TEAM (MASK) ×2 IMPLANT
GAUZE SPONGE 4X4 12PLY STRL (GAUZE/BANDAGES/DRESSINGS) IMPLANT
GAUZE XEROFORM 5X9 LF (GAUZE/BANDAGES/DRESSINGS) ×1 IMPLANT
GLOVE BIO SURGEON STRL SZ7 (GLOVE) ×2 IMPLANT
GLOVE BIOGEL PI IND STRL 7.0 (GLOVE) ×1 IMPLANT
GLOVE ORTHO TXT STRL SZ7.5 (GLOVE) ×2 IMPLANT
GOWN STRL SURGICAL XL XLNG (GOWN DISPOSABLE) ×2 IMPLANT
GUIDEPIN VERSANAIL DSP 3.2X444 (ORTHOPEDIC DISPOSABLE SUPPLIES) IMPLANT
HIP FR NAIL LAG SCREW 10.5X110 (Orthopedic Implant) ×1 IMPLANT
KIT TURNOVER KIT B (KITS) ×1 IMPLANT
MANIFOLD NEPTUNE II (INSTRUMENTS) ×1 IMPLANT
NAIL HIP FRACT 130D 9X180 (Orthopedic Implant) IMPLANT
NS IRRIG 1000ML POUR BTL (IV SOLUTION) ×1 IMPLANT
PACK GENERAL/GYN (CUSTOM PROCEDURE TRAY) ×1 IMPLANT
PAD ARMBOARD 7.5X6 YLW CONV (MISCELLANEOUS) ×2 IMPLANT
SCREW BONE CORTICAL 5.0X42 (Screw) IMPLANT
SCREW LAG HIP FR NAIL 10.5X110 (Orthopedic Implant) IMPLANT
SUT VIC AB 0 CT1 27 (SUTURE) ×1
SUT VIC AB 0 CT1 27XBRD ANBCTR (SUTURE) ×1 IMPLANT
SUT VIC AB 3-0 SH 8-18 (SUTURE) ×1 IMPLANT
TOWEL GREEN STERILE (TOWEL DISPOSABLE) ×1 IMPLANT
TOWEL GREEN STERILE FF (TOWEL DISPOSABLE) ×1 IMPLANT
WATER STERILE IRR 1000ML POUR (IV SOLUTION) ×1 IMPLANT

## 2023-06-10 NOTE — Op Note (Signed)
DATE OF SURGERY:  06/10/2023  TIME: 10:55 AM  PATIENT NAME:  Dylan Bruce  AGE: 77 y.o.  PRE-OPERATIVE DIAGNOSIS: Left intertrochanteric hip fracture  POST-OPERATIVE DIAGNOSIS:  SAME  PROCEDURE:  INTRAMEDULLARY (IM) NAIL INTERTROCHANTERIC  SURGEON:  Eulas Post  ASSISTANT:  Janine Ores, PA-C, present and scrubbed throughout the case, critical for assistance with exposure, retraction, instrumentation, and closure.  OPERATIVE IMPLANTS:   Implant Name Type Inv. Item Serial No. Manufacturer Lot No. LRB No. Used Action  NAIL HIP FRACT 130D 9X180 - [id number] Orthopedic Implant NAIL HIP FRACT 130D 9X180  [employer]IM[employer](ORTH,TRAU,BIO,SG) [id number] Left 1 Implanted  HIP FR NAIL LAG SCREW 10.5X110 - [id number] Orthopedic Implant HIP FR NAIL LAG SCREW 10.5X110  [employer]ON(ORTH,TRAU,BIO,SG) [id number] Left 1 Implanted  SCREW BONE CORTICAL 5.0X42 - [id number] Screw SCREW BONE CORTICAL 5.0X42  ZIMMER RECON(ORTH,TRAU,BIO,SG) [id number] Left 1 Implanted    UNIQUE ASPECTS OF THE CASE: The bone quality was actually relatively good.  He had an area over the abdominal fold that had excoriation and erythema, and I sealed this out of the surgical field, but it was very close to where his greater trochanter was, I barely had enough room to get access without including that questionable skin area in the field.  1 where another, it was repaired with a preoperative prescrub as well as the DuraPrep scrub.  His anatomy was fairly large, and the nail had to be fairly countersunk to be in the appropriate place, I used a size 9 nail, because the bone felt reasonably good, although the ultimate entry angle was a little bit valgus, but the nail sat smoothly within the canal.  Of note he also has contralateral tibial ulcerations and lower extremity ulcerations that are chronic.  ESTIMATED BLOOD LOSS: 75 mL  PREOPERATIVE INDICATIONS:  Dylan Bruce is a 77 y.o. year old who fell and suffered a hip fracture. He was  brought into the ER and then admitted and optimized and then elected for surgical intervention.  He initially presented to an outside facility and requested transfer.  The risks benefits and alternatives were discussed with the patient including but not limited to the risks of nonoperative treatment, versus surgical intervention including infection, bleeding, nerve injury, malunion, nonunion, hardware prominence, hardware failure, need for hardware removal, blood clots, cardiopulmonary complications, morbidity, mortality, among others, and they were willing to proceed.    OPERATIVE PROCEDURE:  The patient was brought to the operating room and placed in the supine position. Anesthesia was administered. He was placed on the fracture table.  Closed reduction was performed under C-arm guidance.  Time out was then performed after sterile prep and drape. He received preoperative antibiotics.  Incision was made proximal to the greater trochanter. A guidewire was placed in the appropriate position. Confirmation was made on AP and lateral views.  The above-named nail was opened. I opened the proximal femur with a reamer. I then placed the nail by hand down. I did not need to ream the femur.  Once the nail was completely seated, I placed a guidepin into the femoral head into the center center position. I measured the length, and then reamed the lateral cortex and up into the head. I then placed the cephalomedullary screw.   I then secured the proximal interlocking bolt, and locked the nail distally using the jig.  I took final C-arm pictures AP and lateral.   Anatomic reconstruction was achieved, and the wounds were irrigated copiously and closed with Vicryl followed by  Steri-Strips and sterile gauze for the skin. The patient was awakened and returned to PACU in stable and satisfactory condition. There were no complications and the patient tolerated the procedure well.  He will be weightbearing as tolerated,  and will be on chemoprophylaxis for a period of four weeks after discharge.   Teryl Lucy, M.D.

## 2023-06-10 NOTE — Discharge Instructions (Signed)
Diet: As you were doing prior to hospitalization   Shower:  May shower but keep the wounds dry, use an occlusive plastic wrap, NO SOAKING IN TUB.  If the bandage gets wet, change with a clean dry gauze.  If you have a splint on, leave the splint in place and keep the splint dry with a plastic bag.  Dressing:  You may change your dressing 3-5 days after surgery, unless you have a splint.  If you have a splint, then just leave the splint in place and we will change your bandages during your first follow-up appointment.    If you had hand or foot surgery, we will plan to remove your stitches in about 2 weeks in the office.  For all other surgeries, there are sticky tapes (steri-strips) on your wounds and all the stitches are absorbable.  Leave the steri-strips in place when changing your dressings, they will peel off with time, usually 2-3 weeks.  Activity:  Increase activity slowly as tolerated, but follow the weight bearing instructions below.  The rules on driving is that you can not be taking narcotics while you drive, and you must feel in control of the vehicle.    Weight Bearing:   weight bearing as tolerated left leg  To prevent constipation: you may use a stool softener such as -  Colace (over the counter) 100 mg by mouth twice a day  Drink plenty of fluids (prune juice may be helpful) and high fiber foods Miralax (over the counter) for constipation as needed.    Itching:  If you experience itching with your medications, try taking only a single pain pill, or even half a pain pill at a time.  You may take up to 10 pain pills per day, and you can also use benadryl over the counter for itching or also to help with sleep.   Precautions:  If you experience chest pain or shortness of breath - call 911 immediately for transfer to the hospital emergency department!!  If you develop a fever greater that 101 F, purulent drainage from wound, increased redness or drainage from wound, or calf pain --  Call the office at [phone]                                                Follow- Up Appointment:  Please call for an appointment to be seen in 2 weeks [place] - [phone]

## 2023-06-10 NOTE — Anesthesia Postprocedure Evaluation (Signed)
Anesthesia Post Note  Patient: Dylan Bruce  Procedure(s) Performed: INTRAMEDULLARY (IM) NAIL INTERTROCHANTERIC (Left)     Patient location during evaluation: PACU Anesthesia Type: General Level of consciousness: awake and alert Pain management: pain level controlled Vital Signs Assessment: post-procedure vital signs reviewed and stable Respiratory status: spontaneous breathing, nonlabored ventilation, respiratory function stable and patient connected to nasal cannula oxygen Cardiovascular status: blood pressure returned to baseline and stable Postop Assessment: no apparent nausea or vomiting Anesthetic complications: no  No notable events documented.  Last Vitals:  Vitals:   06/10/23 1230 06/10/23 1245  BP: 135/72   Pulse: 68   Resp: 17 20  Temp: 36.9 C   SpO2: 98%     Last Pain:  Vitals:   06/10/23 1230  TempSrc:   PainSc: 2                  ,W. EDMOND

## 2023-06-10 NOTE — Consult Note (Signed)
ORTHOPAEDIC CONSULTATION  REQUESTING PHYSICIAN: [name], MD  Chief Complaint: Left hip pain  HPI: Dylan Bruce is a 77 y.o. male who complains of left hip pain that has been there for probably over a week.  He initially presented to an outside facility, had x-rays taken that were read as negative.  He then went home, and then presented back afterwards with increasing hip pain.  CT scan from [facility] demonstrated a nondisplaced intertrochanteric hip fracture, I was only able to review one of the series from that CT scan because that was the only one that was loaded into [employer].  Nonetheless he has elected transfer to [facility] because he receives his care here, and has had significant pain with ambulation, worse with movement, better with rest.  Located around the left hip.  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 Clifton James and Dr. Delia Chimes   SLEEP APNEA    SPINA BIFIDA    Past Surgical History:  Procedure Laterality Date   BACK SURGERY     CARPAL TUNNEL RELEASE Left 08/21/2019   Procedure: CARPAL TUNNEL RELEASE;  Surgeon: Cindee Salt, MD;  Location: [facility];  Service: Orthopedics;  Laterality: Left;  AXILLARY BLOCK   CORONARY ARTERY BYPASS GRAFT     INTRAOPERATIVE TRANSTHORACIC ECHOCARDIOGRAM N/A 02/21/2023   Procedure: INTRAOPERATIVE TRANSTHORACIC ECHOCARDIOGRAM;  Surgeon: Kathleene Hazel, MD;  Location: [facility] INVASIVE CV [facility];  Service: Open Heart Surgery;  Laterality: N/A;   JOINT REPLACEMENT Left    shoulder   LEFT HEART CATH AND CORS/GRAFTS ANGIOGRAPHY N/A 06/18/2018   Procedure: LEFT HEART CATH AND CORS/GRAFTS ANGIOGRAPHY;  Surgeon: Runell Gess, MD;  Location: [facility]  INVASIVE CV LAB;  Service: Cardiovascular;  Laterality: N/A;   RIGHT/LEFT HEART CATH AND CORONARY/GRAFT ANGIOGRAPHY N/A 01/11/2023   Procedure: RIGHT/LEFT HEART CATH AND CORONARY/GRAFT ANGIOGRAPHY;  Surgeon: Kathleene Hazel, MD;  Location: [facility];  Service: Cardiovascular;  Laterality: N/A;   TRANSCATHETER AORTIC VALVE REPLACEMENT, TRANSFEMORAL Left 02/21/2023   Procedure: Transcatheter Aortic Valve Replacement, Transfemoral;  Surgeon: Kathleene Hazel, MD;  Location: [facility];  Service: Open Heart Surgery;  Laterality: Left;   TRIGGER FINGER RELEASE Left 08/21/2019   Procedure: RELEASE TRIGGER LEFT SMALL FINGER LEFT INDEX;  Surgeon: Cindee Salt, MD;  Location: [facility];  Service: Orthopedics;  Laterality: Left;   ULNAR NERVE TRANSPOSITION Left 08/21/2019   Procedure: DECOMPRESSION WITH ULNAR NERVE LEFT CUBITAL TUNNEL ULNAR;  Surgeon: Cindee Salt, MD;  Location: [facility];  Service: Orthopedics;  Laterality: Left;   Social History   Socioeconomic History   Marital status: Married    Spouse name: Not on file   Number of children: 2   Years of education: Not on file   Highest education level: Not on file  Occupational History   Occupation: Retired-Owned a lumberyard/sawmill  Tobacco Use   Smoking status: Never   Smokeless tobacco: Never  Substance and Sexual Activity   Alcohol use: Not Currently   Drug use: No   Sexual activity: Not Currently  Other Topics Concern  Not on file  Social History Narrative   Not on file   Social Determinants of Health   Financial Resource Strain: Medium Risk (05/25/2023)   Received from [facility]   Overall Financial Resource Strain (CARDIA)    Difficulty of Paying Living Expenses: Somewhat hard  Food Insecurity: No Food Insecurity (06/09/2023)   Hunger Vital Sign    Worried About Running Out of Food in the Last Year: Never true    Ran Out of Food in the Last Year: Never true   Transportation Needs: No Transportation Needs (06/09/2023)   PRAPARE - Administrator, Civil Service (Medical): No    Lack of Transportation (Non-Medical): No  Physical Activity: Inactive (05/25/2023)   [facility] from [facility]   Exercise Vital Sign    Days of Exercise per Week: 0 days    Minutes of Exercise per Session: 0 min  Stress: Stress Concern Present (05/25/2023)   Received from [facility] - Occupational Stress Questionnaire    Feeling of Stress : To some extent  Social Connections: Socially Integrated (05/25/2023)   Received from [facility]   Social Connection and Isolation Panel [NHANES]    Frequency of Communication with Friends and Family: More than three times a week    Frequency of Social Gatherings with Friends and Family: More than three times a week    Attends Religious Services: More than 4 times per year    Active Member of [employer] or Organizations: Yes    Attends Engineer, structural: More than 4 times per year    Marital Status: Married   Family History  Problem Relation Age of Onset   Valvular heart disease Father    Prostate cancer Father    Allergies  Allergen Reactions   Cefuroxime Diarrhea and Nausea And Vomiting    Severe. Can take Ancef 2g IV on 02/21/2023 without reporting any issue   Ibuprofen Anaphylaxis and Shortness Of Breath        Iodinated Contrast Media Shortness Of Breath and Other (See Comments)    Pt developed burning of face and SOB after 02/06/2023 CT scan    Cefpodoxime Rash   Ciprofloxacin Rash   Doxycycline Rash   Sulfamethoxazole-Trimethoprim Rash   Fluocinolone Other (See Comments)    Unknown reaction   Probenecid Other (See Comments)    Unknown reaction   Allopurinol Rash   Azithromycin Rash   Sulfa Antibiotics Rash     Positive ROS: All other systems have been reviewed and were otherwise negative with the exception of those mentioned in the HPI and  as above.  Physical Exam: BP (!) 167/83 (BP Location: Right Arm)   Pulse 66   Temp 99.1 F (37.3 C) (Oral)   Resp 19   Ht 6\' 2"  (1.88 m)   Wt 90.2 kg   SpO2 96%   BMI 25.53 kg/m   General: Alert, no acute distress Cardiovascular: No pedal edema Respiratory: No cyanosis, no use of accessory musculature GI: No organomegaly, abdomen is soft and non-tender Skin: No lesions in the area of chief complaint Neurologic: Sensation intact distally Psychiatric: Patient is competent for consent with normal mood and affect Lymphatic: No axillary or cervical lymphadenopathy  MUSCULOSKELETAL: Left hip has a painful logroll, EHL and FHL are intact.  Assessment: Principal Problem:   Closed left hip fracture, initial encounter [facility]) Active Problems:   Type 2 diabetes mellitus with hyperlipidemia (HCC)  Essential hypertension   Chronic kidney disease, stage 3b (HCC)   S/P TAVR (transcatheter aortic valve replacement)   PAF (paroxysmal atrial fibrillation) (HCC)   Fracture of multiple ribs of left side   Diabetic ulcer of right foot (HCC)   Venous stasis ulcer of right lower leg with edema of right lower leg (HCC)   Squamous cell carcinoma of skin    Plan: This is an acute significant injury, and he has significant risks as indicated above.  We will focus on his hip fracture although we may need to give consideration to management of the diabetic foot ulcer as well.  The risks benefits and alternatives were discussed with the patient including but not limited to the risks of nonoperative treatment, versus surgical intervention including infection, bleeding, nerve injury, malunion, nonunion, the need for revision surgery, hardware prominence, hardware failure, the need for hardware removal, blood clots, cardiopulmonary complications, morbidity, mortality, among others, and they were willing to proceed.    On the CAT scan it looks like the fracture extended up into the neck as well, this may  be a little bit of a basicervical type pattern, it is unusual, but I think internal fixation is most appropriate next course of action, I do not think we need to jump to arthroplasty.    Eulas Post, MD Cell [phone]   06/10/2023 8:52 AM

## 2023-06-10 NOTE — Transfer of Care (Signed)
Immediate Anesthesia Transfer of Care Note  Patient: Dylan Bruce  Procedure(s) Performed: INTRAMEDULLARY (IM) NAIL INTERTROCHANTERIC (Left)  Patient Location: PACU  Anesthesia Type:General  Level of Consciousness: awake, alert , and oriented  Airway & Oxygen Therapy: Patient Spontanous Breathing  Post-op Assessment: Report given to RN and Post -op Vital signs reviewed and stable  Post vital signs: Reviewed and stable  Last Vitals:  Vitals Value Taken Time  BP 153/60 06/10/23 1115  Temp    Pulse 67 06/10/23 1115  Resp 15 06/10/23 1115  SpO2 91 % 06/10/23 1115  Vitals shown include unfiled device data.  Last Pain:  Vitals:   06/10/23 0924  TempSrc:   PainSc: 7          Complications: No notable events documented.

## 2023-06-10 NOTE — Consult Note (Signed)
WOC Nurse Consult Note: Reason for Consult:Right LE pretibial full thickness wound in the presence of venous insufficiency, pre ulcerative callus to right foot Wound type:venous insufficiency, neuropathic Pressure Injury POA: N/A Measurement:Right LE wound to be measured by bedside RN with application of next dressing change today Wound bed:See also photo documentation uploaded to EMR by Provider, red, moist Drainage (amount, consistency, odor) light yellow exudate Periwound:hemosiderin staining Dressing procedure/placement/frequency: I have provided Nursing with guidance for the care of the right pretibial ulcer using a daily cleanse and dressing with antimicrobial nonadherent gauze (xeroform).   Recommendations: The pre callus area on the right foot can be managed with a post acute referral to Podiatric Medicine. 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  Turning and repositioning to prevent sacral PI is an important element of the POC as is floatation of the heels to prevent PI to those areas.  WOC nursing team will not follow, but will remain available to this patient, the nursing and medical teams.  Please re-consult if needed.  Thank you for inviting [place] to participate in this patient's Plan of Care.  Ladona Mow, MSN, RN, CNS, GNP, Leda Min, [employer], [employer] phone:  [phone]

## 2023-06-10 NOTE — Anesthesia Procedure Notes (Signed)
Procedure Name: Intubation Date/Time: 06/10/2023 9:46 AM  Performed by: Garfield Cornea, CRNAPre-anesthesia Checklist: Patient identified, Emergency Drugs available, Suction available and Patient being monitored Patient Re-evaluated:Patient Re-evaluated prior to induction Oxygen Delivery Method: Circle System Utilized Preoxygenation: Pre-oxygenation with 100% oxygen Induction Type: IV induction Ventilation: Mask ventilation without difficulty and Oral airway inserted - appropriate to patient size Laryngoscope Size: Mac and 4 Grade View: Grade I Tube type: Oral Tube size: 7.5 mm Number of attempts: 1 Airway Equipment and Method: Stylet and Oral airway Placement Confirmation: ETT inserted through vocal cords under direct vision, positive ETCO2 and breath sounds checked- equal and bilateral Secured at: 22 cm Tube secured with: Tape Dental Injury: Teeth and Oropharynx as per pre-operative assessment
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
## 2023-06-10 NOTE — Plan of Care (Signed)
  Problem: Education: Goal: Knowledge of General Education information will improve Description: Including pain rating scale, medication(s)/side effects and non-pharmacologic comfort measures Outcome: Progressing   Problem: Health Behavior/Discharge Planning: Goal: Ability to manage health-related needs will improve Outcome: Progressing   Problem: Clinical Measurements: Goal: Ability to maintain clinical measurements within normal limits will improve Outcome: Progressing Goal: Will remain free from infection Outcome: Progressing Goal: Diagnostic test results will improve Outcome: Progressing Goal: Respiratory complications will improve Outcome: Progressing Goal: Cardiovascular complication will be avoided Outcome: Progressing   Problem: Activity: Goal: Risk for activity intolerance will decrease Outcome: Progressing   Problem: Nutrition: Goal: Adequate nutrition will be maintained Outcome: Progressing   Problem: Coping: Goal: Level of anxiety will decrease Outcome: Progressing   Problem: Elimination: Goal: Will not experience complications related to bowel motility Outcome: Progressing Goal: Will not experience complications related to urinary retention Outcome: Progressing   Problem: Pain Managment: Goal: General experience of comfort will improve Outcome: Progressing   Problem: Safety: Goal: Ability to remain free from injury will improve Outcome: Progressing   Problem: Skin Integrity: Goal: Risk for impaired skin integrity will decrease Outcome: Progressing   Problem: Education: Goal: Ability to describe self-care measures that may prevent or decrease complications (Diabetes Survival Skills Education) will improve Outcome: Progressing Goal: Individualized Educational Video(s) Outcome: Progressing   Problem: Coping: Goal: Ability to adjust to condition or change in health will improve Outcome: Progressing   Problem: Fluid Volume: Goal: Ability to  maintain a balanced intake and output will improve Outcome: Progressing   Problem: Health Behavior/Discharge Planning: Goal: Ability to identify and utilize available resources and services will improve Outcome: Progressing Goal: Ability to manage health-related needs will improve Outcome: Progressing   Problem: Metabolic: Goal: Ability to maintain appropriate glucose levels will improve Outcome: Progressing   Problem: Nutritional: Goal: Maintenance of adequate nutrition will improve Outcome: Progressing Goal: Progress toward achieving an optimal weight will improve Outcome: Progressing   Problem: Skin Integrity: Goal: Risk for impaired skin integrity will decrease Outcome: Progressing   Problem: Tissue Perfusion: Goal: Adequacy of tissue perfusion will improve Outcome: Progressing   Problem: Education: Goal: Verbalization of understanding the information provided (i.e., activity precautions, restrictions, etc) will improve Outcome: Progressing Goal: Individualized Educational Video(s) Outcome: Progressing   Problem: Activity: Goal: Ability to ambulate and perform ADLs will improve Outcome: Progressing   Problem: Clinical Measurements: Goal: Postoperative complications will be avoided or minimized Outcome: Progressing   Problem: Self-Concept: Goal: Ability to maintain and perform role responsibilities to the fullest extent possible will improve Outcome: Progressing   Problem: Pain Management: Goal: Pain level will decrease Outcome: Progressing   

## 2023-06-10 NOTE — Anesthesia Preprocedure Evaluation (Addendum)
Anesthesia Evaluation  Patient identified by MRN, date of birth, ID band Patient awake    Reviewed: Allergy & Precautions, H&P , NPO status , Patient's Chart, lab work & pertinent test results  Airway Mallampati: II  TM Distance: >3 FB Neck ROM: Full    Dental no notable dental hx. (+) Edentulous Upper, Partial Lower, Dental Advisory Given   Pulmonary sleep apnea and Continuous Positive Airway Pressure Ventilation    Pulmonary exam normal breath sounds clear to auscultation       Cardiovascular hypertension, + CAD, + Past MI, + CABG and +CHF   Rhythm:Regular Rate:Normal     Neuro/Psych   Anxiety Depression    negative neurological ROS     GI/Hepatic Neg liver ROS,GERD  Medicated,,  Endo/Other  diabetes, Type 2, Oral Hypoglycemic Agents, Insulin Dependent    Renal/GU Renal InsufficiencyRenal disease  negative genitourinary   Musculoskeletal  (+) Arthritis , Osteoarthritis,    Abdominal   Peds  Hematology  (+) Blood dyscrasia, anemia   Anesthesia Other Findings   Reproductive/Obstetrics negative OB ROS                             Anesthesia Physical Anesthesia Plan  ASA: 3  Anesthesia Plan: General   Post-op Pain Management:    Induction: Intravenous  PONV Risk Score and Plan: 3 and Ondansetron, Dexamethasone and Treatment may vary due to age or medical condition  Airway Management Planned: Oral ETT  Additional Equipment:   Intra-op Plan:   Post-operative Plan: Extubation in OR  Informed Consent: I have reviewed the patients History and Physical, chart, labs and discussed the procedure including the risks, benefits and alternatives for the proposed anesthesia with the patient or authorized representative who has indicated his/her understanding and acceptance.     Dental advisory given  Plan Discussed with: CRNA  Anesthesia Plan Comments:        Anesthesia Quick  Evaluation

## 2023-06-11 ENCOUNTER — Inpatient Hospital Stay (HOSPITAL_COMMUNITY): Payer: Commercial Managed Care - PPO

## 2023-06-11 DIAGNOSIS — E43 Unspecified severe protein-calorie malnutrition: Secondary | ICD-10-CM | POA: Insufficient documentation

## 2023-06-11 DIAGNOSIS — S72002A Fracture of unspecified part of neck of left femur, initial encounter for closed fracture: Secondary | ICD-10-CM | POA: Diagnosis not present

## 2023-06-11 DIAGNOSIS — L97909 Non-pressure chronic ulcer of unspecified part of unspecified lower leg with unspecified severity: Secondary | ICD-10-CM | POA: Diagnosis not present

## 2023-06-11 LAB — VAS US ABI WITH/WO TBI: Left ABI: 1.32

## 2023-06-11 LAB — GLUCOSE, CAPILLARY
Glucose-Capillary: 154 mg/dL — ABNORMAL HIGH (ref 70–99)
Glucose-Capillary: 103 mg/dL — ABNORMAL HIGH (ref 70–99)
Glucose-Capillary: 298 mg/dL — ABNORMAL HIGH (ref 70–99)
Glucose-Capillary: 223 mg/dL — ABNORMAL HIGH (ref 70–99)
Glucose-Capillary: 227 mg/dL — ABNORMAL HIGH (ref 70–99)

## 2023-06-11 MED ORDER — OXYCODONE HCL 5 MG PO TABS
10.0000 mg | ORAL_TABLET | ORAL | Status: DC | PRN
  Administered 2023-06-11 – 2023-06-14 (×13): 15 mg via ORAL
  Administered 2023-06-14: 10 mg via ORAL
  Administered 2023-06-14 – 2023-06-15 (×5): 15 mg via ORAL
  Administered 2023-06-15: 10 mg via ORAL
  Administered 2023-06-15 – 2023-06-16 (×4): 15 mg via ORAL
  Filled 2023-06-11 (×22): qty 3

## 2023-06-11 MED ORDER — PREGABALIN 50 MG PO CAPS
100.0000 mg | ORAL_CAPSULE | Freq: Two times a day (BID) | ORAL | Status: DC
  Administered 2023-06-11 – 2023-06-16 (×10): 100 mg via ORAL
  Filled 2023-06-11 (×10): qty 2

## 2023-06-11 MED ORDER — VILAZODONE HCL 20 MG PO TABS
40.0000 mg | ORAL_TABLET | Freq: Every day | ORAL | Status: DC
  Administered 2023-06-11 – 2023-06-16 (×6): 40 mg via ORAL
  Filled 2023-06-11 (×9): qty 2

## 2023-06-11 MED ORDER — INSULIN ASPART 100 UNIT/ML IJ SOLN
6.0000 [IU] | Freq: Three times a day (TID) | INTRAMUSCULAR | Status: DC
  Administered 2023-06-11 – 2023-06-12 (×3): 6 [IU] via SUBCUTANEOUS

## 2023-06-11 MED ORDER — DIPHENHYDRAMINE-ZINC ACETATE 2-0.1 % EX CREA
TOPICAL_CREAM | Freq: Once | CUTANEOUS | Status: DC | PRN
  Filled 2023-06-11: qty 28

## 2023-06-11 MED ORDER — INSULIN ASPART 100 UNIT/ML IJ SOLN
0.0000 [IU] | Freq: Three times a day (TID) | INTRAMUSCULAR | Status: DC
  Administered 2023-06-11: 11 [IU] via SUBCUTANEOUS
  Administered 2023-06-11: 7 [IU] via SUBCUTANEOUS
  Administered 2023-06-12: 3 [IU] via SUBCUTANEOUS
  Administered 2023-06-12: 7 [IU] via SUBCUTANEOUS
  Administered 2023-06-12: 20 [IU] via SUBCUTANEOUS
  Administered 2023-06-13: 11 [IU] via SUBCUTANEOUS
  Administered 2023-06-13 – 2023-06-14 (×3): 7 [IU] via SUBCUTANEOUS
  Administered 2023-06-14 (×2): 11 [IU] via SUBCUTANEOUS
  Administered 2023-06-15: 4 [IU] via SUBCUTANEOUS
  Administered 2023-06-15: 11 [IU] via SUBCUTANEOUS
  Administered 2023-06-15: 7 [IU] via SUBCUTANEOUS
  Administered 2023-06-16: 4 [IU] via SUBCUTANEOUS
  Administered 2023-06-16: 15 [IU] via SUBCUTANEOUS

## 2023-06-11 MED ORDER — HYDROCERIN EX CREA
TOPICAL_CREAM | Freq: Two times a day (BID) | CUTANEOUS | Status: DC
  Filled 2023-06-11: qty 113

## 2023-06-11 MED ORDER — ADULT MULTIVITAMIN W/MINERALS CH
1.0000 | ORAL_TABLET | Freq: Every day | ORAL | Status: DC
  Administered 2023-06-12 – 2023-06-16 (×5): 1 via ORAL
  Filled 2023-06-11 (×5): qty 1

## 2023-06-11 MED ORDER — HYDROMORPHONE HCL 1 MG/ML IJ SOLN: 0.5000 mg | INTRAMUSCULAR | Status: DC | PRN

## 2023-06-11 MED ORDER — OXYCODONE HCL 5 MG PO TABS
5.0000 mg | ORAL_TABLET | ORAL | Status: DC | PRN
  Administered 2023-06-12: 10 mg via ORAL
  Filled 2023-06-11 (×3): qty 2

## 2023-06-11 MED ORDER — ALBUTEROL SULFATE (2.5 MG/3ML) 0.083% IN NEBU: 2.5000 mg | INHALATION_SOLUTION | RESPIRATORY_TRACT | Status: DC | PRN

## 2023-06-11 MED ORDER — MIRTAZAPINE 15 MG PO TABS
30.0000 mg | ORAL_TABLET | Freq: Every day | ORAL | Status: DC
  Administered 2023-06-11 – 2023-06-15 (×5): 30 mg via ORAL
  Filled 2023-06-11 (×5): qty 2

## 2023-06-11 MED ORDER — GLUCERNA SHAKE PO LIQD
237.0000 mL | Freq: Three times a day (TID) | ORAL | Status: DC
  Administered 2023-06-11 – 2023-06-16 (×14): 237 mL via ORAL

## 2023-06-11 NOTE — Progress Notes (Signed)
Initial Nutrition Assessment  DOCUMENTATION CODES:   Severe malnutrition in context of chronic illness  INTERVENTION:  Continue Regular diet as ordered Glucerna Shake po TID, each supplement provides 220 kcal and 10 grams of protein MVI with minerals daily "High Calorie, High Protein Nutrition Therapy" handout added to AVS  NUTRITION DIAGNOSIS:   Severe Malnutrition related to chronic illness (AS s/p TAVR, CKD stage IIIa, T2DM) as evidenced by percent weight loss, severe muscle depletion, mild fat depletion.  GOAL:   Patient will meet greater than or equal to 90% of their needs  MONITOR:   PO intake, Supplement acceptance, Labs, Weight trends, Skin  REASON FOR ASSESSMENT:   Consult Wound healing  ASSESSMENT:   Pt transferred from [facility] on 8/3 with L rib fracture and intertrochanteric L hip fracture. PMH significant for AS s/p TAVR, T2DM, CKD stage IIIa, HTN, and frequent falls.  8/4 - s/p intramedullary nail on L hip  Pt sitting up in chair at time of visit with multiple family members present throughout visit. He is s/p TAVR in April which is when he feels he had a significant decline in his appetite. Since then his appetite has slightly improved but is not back to baseline. Pt recalls eating an egg or sausage mcmuffin for breakfast. Lunch may include a pack of Nabs or a small hamburger. Dinner he reports is a small portion size but in the evening he recalls feeling so hungry that he snacks the rest of the night and feels as though he cannot catch up. Encouraged pt to consume foods more frequently throughout the day and include protein containing foods to help with satiety.   Meal completions: 8/4: 50% dinner 8/5: 100% breakfast  Pt states that about 2 years ago at a doctor's appointment, his physician was concerned about his muscle/fat losses. He reports that his weight loss has remained ongoing since that time especially in the setting of decreased appetite.  Reviewed  weight history on file. Since 04/22, pt is noted to have had a weight loss of 12.5% which is clinically significant for time frame.   Pt manages his blood sugar at home with insulin. He typically checks his blood sugar before meals. His wife reports that his blood sugars have been elevated for a while.  Medications: colace, jardiance, ferrous sulfate, SSI 0- 20 units TID, semglee 20 units daily, mag-ox, protonix, senna, torsemide Drips: NaCl with KCl @75ml /hr, abx  Labs: BUN 35, Cr 1.34, GFR 55, CBG's 103-406 x24 hours  NUTRITION - FOCUSED PHYSICAL EXAM:  Flowsheet Row Most Recent Value  Orbital Region Mild depletion  Upper Arm Region Severe depletion  Thoracic and Lumbar Region No depletion  Buccal Region Mild depletion  Temple Region Mild depletion  Clavicle Bone Region Severe depletion  Clavicle and Acromion Bone Region Severe depletion  Scapular Bone Region Moderate depletion  Dorsal Hand Severe depletion  Patellar Region Moderate depletion  Anterior Thigh Region Moderate depletion  Posterior Calf Region Mild depletion  Edema (RD Assessment) None  Hair Reviewed  Eyes Reviewed  Mouth Reviewed  Skin Reviewed  Nails Reviewed       Diet Order:   Diet Order             Diet regular Room service appropriate? Yes; Fluid consistency: Thin  Diet effective now                   EDUCATION NEEDS:   Education needs have been addressed  Skin:  Skin Assessment: Skin Integrity  Issues: Skin Integrity Issues:: Other (Comment), Incisions Incisions: L hip closed incision Other: Per WOC: Right LE pretibial full thickness wound in the presence of venous insufficiency, pre ulcerative callus to right foot  Last BM:  8/5  Height:   Ht Readings from Last 1 Encounters:  06/09/23 6\' 2"  (1.88 m)    Weight:   Wt Readings from Last 1 Encounters:  06/09/23 90.2 kg   BMI:  Body mass index is 25.53 kg/m.  Estimated Nutritional Needs:   Kcal:  2250-2450  Protein:   115-130g  Fluid:  >/=2L  Drusilla Kanner, RDN, LDN Clinical Nutrition

## 2023-06-11 NOTE — Inpatient Diabetes Management (Signed)
Inpatient Diabetes Program Recommendations  AACE/ADA: New Consensus Statement on Inpatient Glycemic Control (2015)  Target Ranges:  Prepandial:   less than 140 mg/dL      Peak postprandial:   less than 180 mg/dL (1-2 hours)      Critically ill patients:  140 - 180 mg/dL    Latest Reference Range & Units 06/10/23 09:24 06/10/23 11:14 06/10/23 15:50 06/10/23 20:13  Glucose-Capillary 70 - 99 mg/dL 295 (H)  5 mg Decadron @1004  191 (H) 360 (H)  9 units Novolog  406 (H)  20 units Novolog  20 units Semglee  (H): Data is abnormally high  Latest Reference Range & Units 06/11/23 07:56 06/11/23 12:01  Glucose-Capillary 70 - 99 mg/dL 188 (H) 416 (H)  11 units Novolog   (H): Data is abnormally high    Home DM Meds: Lantus 22 units at bedtime        Humalog 18-24 units TID        Jardiance 10 mg Daily        Metformin 1000 mg BID        Ozempic 4 mg Qweek   Current Orders: Semglee 20 units at bedtime      Novolog Resistant Correction Scale/ SSI (0-20 units) TID AC       Jarrdiance 10 mg Daily    MD- Please consider starting Novolog Meal Coverage:  Novolog 6 units TID with meals (takes 18-24 units Humalog TID with meals at home) HOLD if pt NPO HOLD if pt eats <50% meals     --Will follow patient during hospitalization--  Ambrose Finland RN, MSN, CDCES Diabetes Coordinator Inpatient Glycemic Control Team Team Pager: [phone] (8a-5p)

## 2023-06-11 NOTE — Progress Notes (Signed)
ABI study completed.   Please see CV Procedures for preliminary results.   , RVT  3:18 PM 06/11/23

## 2023-06-11 NOTE — Progress Notes (Addendum)
Subjective: 1 Day Post-Op s/p Procedure(s): INTRAMEDULLARY (IM) NAIL INTERTROCHANTERIC   Patient is alert, oriented. Reports pain at ribs and left hip is severe. He takes percocet 10/325 3-4 times a day at baseline.  Denies chest pain, SOB, Calf pain. No nausea/vomiting. No other complaints.     Objective:  PE: VITALS:   Vitals:   06/11/23 0025 06/11/23 0402 06/11/23 0744 06/11/23 0755  BP:  138/71  137/77  Pulse: 70 63 70 68  Resp: 20 16 16 17   Temp:  98.1 F (36.7 C)  97.9 F (36.6 C)  TempSrc:  Oral  Oral  SpO2: 95% 98% 92% 99%  Weight:      Height:       General: sitting up in bed eating breakfast in no acute distress MSK:  Sensation intact distally Intact pulses distally Dorsiflexion/Plantar flexion intact Incision: dressing C/D/I  LABS  Results for orders placed or performed during the hospital encounter of 06/09/23 (from the past 24 hour(s))  Glucose, capillary     Status: Abnormal   Collection Time: 06/10/23  9:24 AM  Result Value Ref Range   Glucose-Capillary 177 (H) 70 - 99 mg/dL  Glucose, capillary     Status: Abnormal   Collection Time: 06/10/23 11:14 AM  Result Value Ref Range   Glucose-Capillary 191 (H) 70 - 99 mg/dL  Glucose, capillary     Status: Abnormal   Collection Time: 06/10/23  3:50 PM  Result Value Ref Range   Glucose-Capillary 360 (H) 70 - 99 mg/dL  Glucose, capillary     Status: Abnormal   Collection Time: 06/10/23  8:13 PM  Result Value Ref Range   Glucose-Capillary 406 (H) 70 - 99 mg/dL  Glucose, capillary     Status: Abnormal   Collection Time: 06/10/23 11:23 PM  Result Value Ref Range   Glucose-Capillary 364 (H) 70 - 99 mg/dL  CBC     Status: Abnormal   Collection Time: 06/11/23 12:03 AM  Result Value Ref Range   WBC 13.0 (H) 4.0 - 10.5 K/uL   RBC 4.30 4.22 - 5.81 MIL/uL   Hemoglobin 10.9 (L) 13.0 - 17.0 g/dL   HCT 16.1 (L) 09.6 - 04.5 %   MCV 80.9 80.0 - 100.0 fL   MCH 25.3 (L) 26.0 - 34.0 pg   MCHC 31.3 30.0 - 36.0  g/dL   RDW 40.9 (H) 81.1 - 91.4 %   Platelets 268 150 - 400 K/uL   nRBC 0.0 0.0 - 0.2 %  Basic metabolic panel     Status: Abnormal   Collection Time: 06/11/23 12:03 AM  Result Value Ref Range   Sodium 136 135 - 145 mmol/L   Potassium 4.4 3.5 - 5.1 mmol/L   Chloride 103 98 - 111 mmol/L   CO2 24 22 - 32 mmol/L   Glucose, Bld 349 (H) 70 - 99 mg/dL   BUN 35 (H) 8 - 23 mg/dL   Creatinine, Ser 7.82 (H) 0.61 - 1.24 mg/dL   Calcium 8.6 (L) 8.9 - 10.3 mg/dL   GFR, Estimated 55 (L) >60 mL/min   Anion gap 9 5 - 15  Glucose, capillary     Status: Abnormal   Collection Time: 06/11/23  4:33 AM  Result Value Ref Range   Glucose-Capillary 154 (H) 70 - 99 mg/dL  Glucose, capillary     Status: Abnormal   Collection Time: 06/11/23  7:56 AM  Result Value Ref Range   Glucose-Capillary 103 (H) 70 - 99  mg/dL    DG HIP UNILAT WITH PELVIS 2-3 VIEWS LEFT  Result Date: 06/10/2023 CLINICAL DATA:  Status post ORIF of the left femur. EXAM: DG HIP (WITH OR WITHOUT PELVIS) 2-3V LEFT COMPARISON:  06/09/2023 FINDINGS: Five images obtained via portable C-arm radiography in the operating room show open reduction and internal fixation of the proximal left femur fracture. Placement of IM nail and hip screw noted. Hardware components are in anatomic alignment. IMPRESSION: Status post ORIF of the proximal left femur fracture. Electronically Signed   By: Signa Kell M.D.   On: 06/10/2023 12:33   DG Hip Port Unilat With Pelvis 1V Left  Result Date: 06/10/2023 CLINICAL DATA:  Left femur fracture status post ORIF EXAM: DG HIP (WITH OR WITHOUT PELVIS) 1V PORT LEFT COMPARISON:  06/09/23 FINDINGS: Postop change from ORIF of the proximal left femur fracture. There is been interval placement of IM nail and hip screw. Hardware components and fracture fragments are in anatomic alignment. IMPRESSION: Postop change from ORIF of the proximal left femur fracture. Electronically Signed   By: Signa Kell M.D.   On: 06/10/2023 12:05    DG C-Arm 1-60 Min-No Report  Result Date: 06/10/2023 Fluoroscopy was utilized by the requesting physician.  No radiographic interpretation.   DG HIP UNILAT WITH PELVIS 2-3 VIEWS LEFT  Result Date: 06/09/2023 CLINICAL DATA:  [id number] Fracture [id number] EXAM: DG HIP (WITH OR WITHOUT PELVIS) 2-3V LEFT COMPARISON:  CT abdomen pelvis 02/06/2023 FINDINGS: Limited evaluation due to overlapping osseous structures and overlying soft tissues. There is no evidence of definite acute displaced hip fracture or dislocation of the left hip. Question vague intertrochanteric lucency that could represent a nondisplaced fracture. Frontal view the right hip unremarkable. No acute displaced fracture or diastasis of the bones of the pelvis. There is no evidence of arthropathy or other focal bone abnormality. Vascular calcification. L5-S1 surgical hardware. IMPRESSION: Limited evaluation due to overlapping osseous structures and overlying soft tissues. Negative for definite acute traumatic injury. Question vague intertrochanteric lucency that could represent a nondisplaced fracture. If high clinical suspicion for traumatic injury, please consider CT. Electronically Signed   By: Tish Frederickson M.D.   On: 06/09/2023 23:06   DG CHEST PORT 1 VIEW  Result Date: 06/09/2023 CLINICAL DATA:  Coronary artery disease, preoperative examination EXAM: PORTABLE CHEST 1 VIEW COMPARISON:  02/19/2023 FINDINGS: The lungs are symmetrically expanded. Previously noted interstitial pulmonary edema has resolved. Coronary artery bypass grafting has been performed. Cardiac size it is within normal limits. Central pulmonary vascular congestion again noted. No pneumothorax or pleural effusion. No acute bone abnormality. Left total shoulder arthroplasty partially visualized. Advanced degenerative changes noted within the right shoulder. IMPRESSION: 1. Central pulmonary vascular congestion without superimposed overt pulmonary edema. Electronically Signed   By:  Helyn Numbers M.D.   On: 06/09/2023 22:39    Assessment/Plan:  1 Day Post-Op s/p Procedure(s): INTRAMEDULLARY (IM) NAIL INTERTROCHANTERIC - Hbg stable at 10.9 today  Weightbearing: WBAT LLE Insicional and dressing care: Reinforce dressings as needed VTE prophylaxis: Home Eliquis restarted today Pain control: Will increase him from low dose pathway to high dose pathway with oxycodone q 4 hrs Follow - up plan: 2 weeks with Dr. [name] Dispo: pending PT/OT evals today  Contact information:   Janine Ores, PA-C Weekdays 8-5  After hours and holidays please check [email or web address] for group call information for [facility]  Armida Sans 06/11/2023, 8:32 AM

## 2023-06-11 NOTE — Progress Notes (Signed)
[name]  Dylan Bruce (DOB: 01-29-1946) LKG:[id number] PCP: Lindwood Qua, MD  Brief Narrative: Dylan Bruce is a 77 y.o. male with a history of AS s/p TAVR, T2DM, HTN, frequent falls who was transferred from [facility] on 8/3 to [facility] for orthopedic evaluation. He had fallen at a PT session, found to have left rib fracture initially and returned the next day to their ED with hip pain found to have intertrochanteric left hip fracture for which he requested admission to [facility]. He had IM nail placement 8/4 by Dr. [name].   Subjective: Seen with PT evaluation, will need rehabilitation. Pain is not controlled on lower doses of percocet, ortho has already made changes. No other complaints. Eating well, sugars up. Son confirms pt drowses off in the middle of conversations often.  Objective: BP 122/67 (BP Location: Left Arm)   Pulse 70   Temp 98.1 F (36.7 C) (Oral)   Resp 16   Ht 6\' 2"  (1.88 m)   Wt 90.2 kg   SpO2 100%   BMI 25.53 kg/m   Gen: No distress Pulm: Clear, nonlabored  CV: RRR, no MRG GI: Soft, NT, ND, +BS  Neuro: Alert and oriented, does fall asleep in the middle of our discussion but is otherwise not drowsy at all. No new focal deficits. Ext: Warm, no deformities. L thigh wounds c/d/i Skin: R shin with skin tear, healthy tissue underlying dressing.    Assessment & Plan: Intertrochanteric left hip fracture: s/p IM nail 8/4 by Dr. [name].  - Eliquis for VTE ppx - Pain control per orthopedics, augmented dosing based on opioid tolerance/dependence PTA. Ok to restart lyrica.  - WBAT, PT/OT to eval 8/5 > recommending SNF. Family/pt in agreement.     Right lower leg wound in presence of venous insufficiency:  - Continue ancef for now, actually appears healthy currently.  - Local wound care.     Fracture of multiple ribs of left side:  - Continue pain control, incentive spirometry.   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 Normal function and LVEF June 2024.  - Continue plavix, continue home meds including isosorbide and demadex.   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 - Needs podiatry follow up   PAF:  - Resume eliquis postoperatively. We have discussed r/b/a with his frequent falls including son in discussion. We will continue DOAC for now.  - No rate control agents on PTA med list, monitor HR.    Stage IIIa CKD: Based on current CrCl.  - Monitor BMP postoperatively, stop empagliflozin. If CrCl worse in AM, hold demadex.   HTN:  - Continue imdur.    T2DM: HbA1c 8.5%.  - Continue glargine 20u qHS, resistant SSI, add 6u TIDWC novolog. Eating well, stop IVF  BPH:  - Continue tamsulosin  HLD:  - Continue statin, zetia  Concern for sleep disorder, known OSA: I suspect this may have been construed as oversedation previously. It may be exaggerated in the current clinical setting.  - Needs referral to neurology. D/w pt and son. - Restrictions on driving, etc. discussed at length and will be reinforced.  - Continue CPAP qHS and would likely benefit from titration to r/o undertreatment  COPD: Quiescent.  -  Continue scheduled BD, prn albuterol  [name], MD [facility] [email or web address] 06/11/2023, 3:52 PM

## 2023-06-11 NOTE — Progress Notes (Signed)
? ?  Inpatient Rehab Admissions Coordinator : ? ?Per therapy recommendations, patient was screened for CIR candidacy by   RN MSN.  At this time patient appears to be a potential candidate for CIR. I will place a rehab consult per protocol for full assessment. Please call me with any questions. ? ?  RN MSN ?Admissions Coordinator ?[phone] ?  ?

## 2023-06-11 NOTE — Progress Notes (Signed)
No wound to Right LE of the heel,pt was observed with thick callus and not open,Patient has wound to his right anterior shin, which was measured and dressing applied

## 2023-06-11 NOTE — Progress Notes (Signed)
   06/11/23 0309  Provider Notification  Provider Name/Title Levon Hedger  Date Provider Notified 06/11/23  Time Provider Notified 0309  Method of Notification Page  Notification Reason Other (Comment) (Patient stated he has Psoriasis, to back ,abdomen,both arms,need cream for rash,on call Johann Capers ,NP informed)  Provider response See new orders  Date of Provider Response 06/11/23  Time of Provider Response [phone]

## 2023-06-11 NOTE — Progress Notes (Signed)
   06/11/23 0025  BiPAP/CPAP/SIPAP  $ Non-Invasive Ventilator  Non-Invasive Vent Set Up;Non-Invasive Vent Initial;Non-Invasive Vent Subsequent  $ Face Mask Medium Yes  BiPAP/CPAP/SIPAP Pt Type Adult  BiPAP/CPAP/SIPAP DREAMSTATIOND  Mask Type Full face mask  Mask Size Medium  Respiratory Rate 20 breaths/min  FiO2 (%) 21 %  Flow Rate 0 lpm  Patient Home Equipment No  Auto Titrate Yes  CPAP/SIPAP surface wiped down Yes  BiPAP/CPAP /SiPAP Vitals  Pulse Rate 70  Resp 20  SpO2 95 %  Bilateral Breath Sounds Clear;Diminished  MEWS Score/Color  MEWS Score 0  MEWS Score Color Green

## 2023-06-11 NOTE — Evaluation (Signed)
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  Clinical Impression   Pt admitted with above diagnosis. Lives at home with his wife, in a single-level home with a level entry; Prior to admission, pt was able to manage independently, walk without an assistive device; was undergoing Outpt PT to work on improving his balance; Presents to PT with decr AROM and strength LLE, effecting ability to transfer and walk, decr funcitonal mobility, rib pain effecting full deep breaths;  Today, pt needed mod assist to come to sit EOB, light mod assist to power up to stand to RW from EOB, and min assist to initaite gait training; Participated very well despite considerable pain, and very motivated to get back to independence; Patient will benefit from intensive inpatient follow up therapy, >3 hours/day   Pt currently with functional limitations due to the deficits listed below (see PT Problem List). Pt will benefit from skilled PT to increase their independence and safety with mobility to allow discharge to the venue listed below.           If plan is discharge home, recommend the following:     Can travel by private vehicle        Equipment Recommendations Rolling walker (2 wheels);BSC/3in1;Other (comment) (will defer bathroom equipment to OT)  Recommendations for Other Services  Rehab consult;OT consult (ordered per protocol)    Functional Status Assessment Patient has had a recent decline in their functional status and  demonstrates the ability to make significant improvements in function in a reasonable and predictable amount of time.     Precautions / Restrictions Precautions Precautions: Fall Precaution Comments: watch closely; possible narcolepsy Restrictions Weight Bearing Restrictions: No LLE Weight Bearing: Weight bearing as tolerated      Mobility  Bed Mobility Overal bed mobility: Needs Assistance Bed Mobility: Supine to Sit     Supine to sit: Mod assist     General bed mobility comments: Cues for technqiue, Mod assist to help elevate trunk and use of bed pad to square off hips at EOB; tends to have antalgic R lean    Transfers Overall transfer level: Needs assistance Equipment used: Rolling walker (2 wheels) Transfers: Sit to/from Stand, Bed to chair/wheelchair/BSC Sit to Stand: Mod assist   Step pivot transfers: Min assist       General transfer comment: Light mod assist to pwoer up to stand from bed; able to take pivot steps, mostly heel-toe pivots on R foot, to move from bed to recliner on pt's right; heavy dependence on UE support from RW    Ambulation/Gait Ambulation/Gait assistance: Min assist, +2 safety/equipment (son assisted with chair follow) Gait Distance (Feet): 3 Feet Assistive device: Rolling walker (2 wheels) Gait Pattern/deviations: Step-to pattern       General Gait Details: Cues for sequence and to self-monitor for activity tolerance  Stairs            Wheelchair Mobility     Tilt Bed    Modified Rankin (Stroke Patients Only)       Balance Overall balance assessment: Needs assistance   Sitting balance-Leahy  Scale: Poor (approaching fair) Sitting balance - Comments: antalgic lean Postural control: Right lateral lean Standing balance support: Bilateral upper extremity supported, During functional activity Standing balance-Leahy Scale: Poor                               Pertinent Vitals/Pain Pain Assessment Pain  Assessment: 0-10 Pain Score: 7  Pain Location: L hip Pain Descriptors / Indicators: Aching Pain Intervention(s): Monitored during session, Repositioned, Patient requesting pain meds-RN notified    Home Living Family/patient expects to be discharged to:: Private residence Living Arrangements: Spouse/significant other Available Help at Discharge: Family Type of Home: House Home Access: Stairs to enter Entrance Stairs-Rails: None Secretary/administrator of Steps: 3   Home Layout: One level Home Equipment: Information systems manager      Prior Function Prior Level of Function : Independent/Modified Independent             Mobility Comments: Walks without assistive device ADLs Comments: independent     Hand Dominance        Extremity/Trunk Assessment   Upper Extremity Assessment Upper Extremity Assessment: Defer to OT evaluation (L rib pain with some LUE movements)    Lower Extremity Assessment Lower Extremity Assessment: LLE deficits/detail;Generalized weakness;RLE deficits/detail RLE Deficits / Details: wound anterior lower leg dressed LLE Deficits / Details: Painful and uncomfortable with movement; grossly decr AROM and strength, limited by pain post fx and surgical repair LLE Coordination: decreased gross motor       Communication   Communication: No difficulties  Cognition Arousal/Alertness: Awake/alert (eyes occasionally drifting closed, mostly during pt interview) Behavior During Therapy: WFL for tasks assessed/performed Overall Cognitive Status: Impaired/Different from baseline Area of Impairment: Memory                     Memory: Decreased short-term memory         General Comments: Overall pleasant and hard-working; notable that had to repeat instructions often during session        General Comments General comments (skin integrity, edema, etc.): Dr. Jarvis Newcomer present during part of session; assisted Dr and pt with re-dressing teh wound after dr  examined    Exercises     Assessment/Plan    PT Assessment Patient needs continued PT services  PT Problem List Decreased strength;Decreased range of motion;Decreased activity tolerance;Decreased balance;Decreased skin integrity;Decreased mobility;Decreased coordination;Decreased knowledge of use of DME;Decreased knowledge of precautions;Pain       PT Treatment Interventions DME instruction;Gait training;Stair training;Functional mobility training;Therapeutic activities;Therapeutic exercise;Balance training;Neuromuscular re-education;Cognitive remediation;Patient/family education    PT Goals (Current goals can be found in the Care Plan section)  Acute Rehab PT Goals Patient Stated Goal: be independent to be able to get home PT Goal Formulation: With patient Time For Goal Achievement: 06/25/23 Potential to Achieve Goals: Good    Frequency Min 1X/week     Co-evaluation               AM-PAC PT "6 Clicks" Mobility  Outcome Measure Help needed turning from your back to your side while in a flat bed without using bedrails?: A Little Help needed moving from lying on your back to sitting on the side of a flat bed without using bedrails?: A Lot Help needed moving to and from a bed to a chair (including a wheelchair)?: A Lot Help needed standing up from a chair using your arms (e.g., wheelchair or bedside chair)?: A Lot Help needed to  walk in hospital room?: A Lot Help needed climbing 3-5 steps with a railing? : A Lot 6 Click Score: 13    End of Session Equipment Utilized During Treatment: Gait belt Activity Tolerance: Patient tolerated treatment well Patient left: in chair;with call bell/phone within reach;with chair alarm set Nurse Communication: Mobility status;Patient requests pain meds PT Visit Diagnosis: Unsteadiness on feet (R26.81);Other abnormalities of gait and mobility (R26.89);Pain Pain - Right/Left: Left Pain - part of body: Hip    Time: 1035-1119 (minus approx  10 minutes as MD was talkign with pt and son) PT Time Calculation (min) (ACUTE ONLY): 44 min   Charges:   PT Evaluation $PT Eval Moderate Complexity: 1 Mod PT Treatments $Gait Training: 8-22 mins PT General Charges $$ ACUTE PT VISIT: 1 Visit         Van Clines, PT  Acute Rehabilitation Services Office [phone] Secure Chat welcomed   Levi Aland 06/11/2023, 5:31 PM

## 2023-06-12 ENCOUNTER — Encounter (HOSPITAL_COMMUNITY): Payer: Self-pay | Admitting: Orthopedic Surgery

## 2023-06-12 DIAGNOSIS — S72002A Fracture of unspecified part of neck of left femur, initial encounter for closed fracture: Secondary | ICD-10-CM | POA: Diagnosis not present

## 2023-06-12 LAB — GLUCOSE, CAPILLARY
Glucose-Capillary: 304 mg/dL — ABNORMAL HIGH (ref 70–99)
Glucose-Capillary: 362 mg/dL — ABNORMAL HIGH (ref 70–99)
Glucose-Capillary: 255 mg/dL — ABNORMAL HIGH (ref 70–99)
Glucose-Capillary: 139 mg/dL — ABNORMAL HIGH (ref 70–99)
Glucose-Capillary: 237 mg/dL — ABNORMAL HIGH (ref 70–99)

## 2023-06-12 LAB — TROPONIN I (HIGH SENSITIVITY)
Troponin I (High Sensitivity): 23 ng/L — ABNORMAL HIGH (ref <18)
Troponin I (High Sensitivity): 22 ng/L — ABNORMAL HIGH (ref <18)

## 2023-06-12 MED ORDER — NITROGLYCERIN 0.4 MG SL SUBL
0.4000 mg | SUBLINGUAL_TABLET | SUBLINGUAL | Status: DC | PRN
  Administered 2023-06-12: 0.4 mg via SUBLINGUAL

## 2023-06-12 MED ORDER — NITROGLYCERIN 0.4 MG SL SUBL
SUBLINGUAL_TABLET | SUBLINGUAL | Status: AC
  Filled 2023-06-12: qty 1

## 2023-06-12 MED ORDER — SODIUM CHLORIDE 0.9 % IV BOLUS: 500.0000 mL | Freq: Once | INTRAVENOUS | Status: DC | PRN

## 2023-06-12 MED ORDER — INSULIN GLARGINE-YFGN 100 UNIT/ML ~~LOC~~ SOLN
28.0000 [IU] | Freq: Every day | SUBCUTANEOUS | Status: DC
  Administered 2023-06-12: 28 [IU] via SUBCUTANEOUS
  Filled 2023-06-12 (×2): qty 0.28

## 2023-06-12 MED ORDER — INSULIN ASPART 100 UNIT/ML IJ SOLN
10.0000 [IU] | Freq: Three times a day (TID) | INTRAMUSCULAR | Status: DC
  Administered 2023-06-12 – 2023-06-14 (×6): 10 [IU] via SUBCUTANEOUS

## 2023-06-12 NOTE — Evaluation (Signed)
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   Clinical Impression   Pt admitted with the above diagnosis. Pt currently with functional limitations due to the deficits listed below (see OT Problem List). Prior to admit, pt was living at home with his wife, independent/Mod I with all ADL tasks and functional mobility.  Pt will benefit from acute skilled OT to increase their safety and independence with ADL and functional mobility for ADL to facilitate discharge. Patient will benefit from intensive inpatient follow up therapy, >3 hours/day. OT will continue to follow patient acutely.         Recommendations for follow up therapy are one component of a multi-disciplinary discharge planning process, led by the attending physician.  Recommendations may be updated based on patient status, additional functional criteria and insurance authorization.   Assistance Recommended at Discharge Frequent or constant Supervision/Assistance  Patient can return home with the following A lot of help with walking and/or transfers;A lot of help with bathing/dressing/bathroom;Assistance with cooking/housework;Assist for transportation;Help with stairs or ramp for entrance    Functional Status Assessment  Patient has had a recent decline in their functional status and demonstrates the ability to make significant improvements in function in a reasonable and predictable amount of time.  Equipment Recommendations  Other (comment) (defer  to next venue of care)    Recommendations for Other Services Rehab consult     Precautions / Restrictions Precautions Precautions: Fall Precaution Comments: watch closely; possible narcolepsy Restrictions Weight Bearing Restrictions: No LLE Weight Bearing: Weight bearing as tolerated      Mobility Bed Mobility Overal bed mobility: Needs Assistance Bed Mobility: Sit to Sidelying         Sit to sidelying: Mod assist General bed mobility comments: VC for technique. Physical assist provided to bring BLE up onto bed. Pt demonstrating increased pain during transition.    Transfers   Equipment used: Rolling walker (2 wheels) Transfers: Sit to/from Stand, Bed to chair/wheelchair/BSC Sit to Stand: Max assist     Step pivot transfers: Min assist     General transfer comment: Increased physical assist required to power up into standing d/t pain level and low level sitting surface (recliner). VC provided for hand placement during RW management. Pain causing pt to laterally lean to the right to offload left side of body.      Balance Overall balance assessment: Needs assistance   Sitting balance-Leahy Scale: Zero Sitting balance - Comments: sitting in recliner reliant on posterior support. Unable to sit unsupported. Postural control: Right lateral lean Standing balance support: Bilateral upper extremity supported, During functional activity, Reliant on assistive device for balance Standing balance-Leahy Scale: Poor Standing balance comment: reliant on RW        ADL either performed or assessed with clinical judgement   ADL Overall ADL's : Needs assistance/impaired Eating/Feeding: Modified independent;Sitting   Grooming: Oral care;Set up;Sitting   Upper Body Bathing: Set up;Sitting   Lower Body Bathing: Maximal assistance;Sit to/from stand   Upper Body Dressing : Minimal assistance;Sitting Upper Body Dressing Details (indicate cue type and reason): required assist to  pull shirt down in the back and front while seated in recliner. Lower Body Dressing: Total assistance Lower Body Dressing Details (indicate cue type and reason): sitting in recliner. sock management                     Vision Baseline Vision/History: 1 Wears glasses (readers) Ability to See in Adequate Light: 0 Adequate Patient Visual Report: No change from baseline Vision Assessment?: No apparent visual deficits            Pertinent Vitals/Pain Pain Assessment Pain Assessment: 0-10 Pain Score: 7  Pain Descriptors / Indicators: Aching, Sore Pain Intervention(s): Limited activity within patient's tolerance, Monitored during session, Repositioned     Hand Dominance Right   Extremity/Trunk Assessment Upper Extremity Assessment Upper Extremity Assessment: Generalized weakness (limited strength d/t left rib fracture)   Lower Extremity Assessment Lower Extremity Assessment: Generalized weakness   Cervical / Trunk Assessment Cervical / Trunk Assessment: Other exceptions Cervical / Trunk Exceptions: forward head, rounded shoulders, posterior pelvic tilt   Communication Communication Communication: No difficulties   Cognition Arousal/Alertness: Awake/alert Behavior During Therapy: WFL for tasks assessed/performed Overall Cognitive Status: Impaired/Different from baseline Area of Impairment: Memory, Following commands, Problem solving        Memory: Decreased short-term memory Following Commands: Follows one step commands with increased time     Problem Solving: Requires verbal cues, Difficulty sequencing, Slow processing       General Comments  BP checked several times during session by NT and nursing. BP soft with both manual and automatic technique. IV bleeding while pt seated in recliner. Nursing aware and removed during session.            Home Living Family/patient expects to be discharged to:: Private residence Living Arrangements: Spouse/significant  other Available Help at Discharge: Family Type of Home: House Home Access: Stairs to enter Secretary/administrator of Steps: 3 Entrance Stairs-Rails: None Home Layout: One level     Bathroom Shower/Tub: Chief Strategy Officer: Standard Bathroom Accessibility: Yes   Home Equipment: Shower seat          Prior Functioning/Environment Prior Level of Function : Independent/Modified Independent        Mobility Comments: Walks without assistive device ADLs Comments: independent        OT Problem List: Decreased strength;Pain;Decreased safety awareness;Decreased activity tolerance;Impaired balance (sitting and/or standing);Decreased knowledge of use of DME or AE;Impaired UE functional use      OT Treatment/Interventions: Self-care/ADL training;Therapeutic exercise;Therapeutic activities;Energy conservation;Cognitive remediation/compensation;Patient/family education;DME and/or AE instruction;Manual therapy;Balance training;Modalities    OT Goals(Current goals can be found in the care plan section) Acute Rehab OT Goals Patient Stated Goal: to get back into bed OT Goal Formulation: Patient unable to participate in goal setting Time For Goal Achievement: 06/26/23 Potential to Achieve Goals: Good  OT Frequency: Min 1X/week       AM-PAC OT "6 Clicks" Daily Activity     Outcome Measure Help from another person eating meals?: None Help from another person taking care of personal grooming?: A Little Help from another person toileting, which includes using toliet, bedpan, or urinal?: A Lot Help from another person bathing (including washing, rinsing, drying)?: Total Help from another person to put on and taking off regular upper body clothing?: A Little Help from another person to put on and taking off regular lower body clothing?: Total 6 Click Score: 14   End of Session Equipment Utilized During Treatment: Gait belt;Rolling walker (2 wheels) Nurse  Communication:  Mobility status  Activity Tolerance: Patient tolerated treatment well Patient left: in bed;with call bell/phone within reach;with bed alarm set;with nursing/sitter in room  OT Visit Diagnosis: Unsteadiness on feet (R26.81);Muscle weakness (generalized) (M62.81);Pain Pain - Right/Left: Left Pain - part of body: Hip (ribs)                Time: 9147-8295 OT Time Calculation (min): 53 min Charges:  OT General Charges $OT Visit: 1 Visit OT Evaluation $OT Eval High Complexity: 1 High OT Treatments $Self Care/Home Management : 23-37 mins  Limmie Patricia, OTR/L,CBIS  Supplemental OT - [facility] and [facility] Secure Chat Preferred    , Charisse March 06/12/2023, 5:09 PM

## 2023-06-12 NOTE — Inpatient Diabetes Management (Addendum)
Inpatient Diabetes Program Recommendations  AACE/ADA: New Consensus Statement on Inpatient Glycemic Control (2015)  Target Ranges:  Prepandial:   less than 140 mg/dL      Peak postprandial:   less than 180 mg/dL (1-2 hours)      Critically ill patients:  140 - 180 mg/dL   Lab Results  Component Value Date   GLUCAP 362 (H) 06/12/2023   HGBA1C 8.5 (H) 02/20/2023    Review of Glycemic Control  Latest Reference Range & Units 06/11/23 07:56 06/11/23 12:01 06/11/23 16:46 06/11/23 20:16 06/12/23 08:27 06/12/23 10:04  Glucose-Capillary 70 - 99 mg/dL 132 (H) 440 (H) 102 (H) 227 (H) 304 (H) 362 (H)  (H): Data is abnormally high  Diabetes history: DM2  Outpatient Diabetes medications:  Semglee 32 units every day Humalog 20 units TID Metformin 1000 mg BID Ozempic 4 mg weekly  Current orders for Inpatient glycemic control:  Semglee 20 units at bedtime Novolog 0-20 units TID  Novolog 6 units TID with meals  Last PCP visit was 7/30 which shows above home medications   Inpatient Diabetes Program Recommendations:    Please consider:  Semglee 28 units at bedtime (home dose is 32 units QD) Novolog 10 units TID with meals if consumes at least 50%  Will continue to follow while inpatient.  Thank you, Dulce Sellar, MSN, CDCES Diabetes Coordinator Inpatient Diabetes Program [phone] (team pager from 8a-5p)

## 2023-06-12 NOTE — Plan of Care (Signed)

## 2023-06-12 NOTE — Significant Event (Signed)
Rapid Response Event Note   Reason for Call :  Chest pain; could not immediately arrive. Patient A&Ox4. Asked to page MD, grab EKG, vitals, place on O2   Initial Focused Assessment:  Patient in bed A&O x4, in no apparent distress. Skin warm and dry. Lungs clear, heart tones normal. C/o mid/left sternal chest pain 9/10, not radiating. Patient also c/o indigestion and passing gas "it's been all day."  120/67 (84) HR 70 RR 18 O2 97% 2L  Interventions:  EKG: aflutter 4:1 troponin Nitroglycerin SL 0.4mg  x1 MD paged Maalox oral 30ml Tele ordered  Plan of Care:  Medication given to relieve indigestion and CP. May need more work up if trop elevated.   Event Summary:  MD Notified: Aubery Lapping MD Call Time: 1610 Arrival Time:  End Time: 1725  Truddie Crumble, RN

## 2023-06-12 NOTE — Progress Notes (Signed)
[name]  Dylan Bruce (DOB: December 11, 1945) UUV:[id number] PCP: Lindwood Qua, MD  Brief Narrative: Dylan Bruce is a 77 y.o. male with a history of AS s/p TAVR, T2DM, HTN, frequent falls who was transferred from [facility] on 8/3 to [facility] for orthopedic evaluation. He had fallen at a PT session, found to have left rib fracture initially and returned the next day to their ED with hip pain found to have intertrochanteric left hip fracture for which he requested admission to [facility]. He had IM nail placement 8/4 by Dr. [name].   Subjective: No overnight events, eating ok, no complaints.   Objective: BP (!) 83/57   Pulse 74   Temp 98.8 F (37.1 C) (Oral)   Resp 17   Ht 6\' 2"  (1.88 m)   Wt 90.2 kg   SpO2 100%   BMI 25.53 kg/m   Gen: No distress Pulm: Clear, nonlabored  CV: RRR, no MRG or edema GI: Soft, NT, ND, +BS  Neuro: Alert and oriented. No new focal deficits. Ext: Warm, no deformities. LLE with lateral surgical dressings c/d/I, scattered surrounding ecchymoses. Skin: R shin wound without odor, exudate, surrounding erythema.  Assessment & Plan: Intertrochanteric left hip fracture: s/p IM nail 8/4 by Dr. [name].  - Eliquis for VTE ppx - Pain control per orthopedics, augmented dosing based on opioid tolerance/dependence PTA. Ok to restart lyrica.  - WBAT, PT/OT to eval 8/5 > recommending CIR, formal consult placed.    Right lower leg wound in presence of venous insufficiency:  - No longer appears infected, DC abx.  - Local wound care.     Fracture of multiple ribs of left side:  - Continue pain control, incentive spirometry.   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 Normal function and LVEF June 2024.  - Continue plavix, continue home meds including isosorbide and demadex.    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 - Needs podiatry follow up   PAF:  - Resume eliquis postoperatively. We have discussed r/b/a with his frequent falls including son in discussion. We will continue DOAC for now.  - No rate control agents on PTA med list, monitor HR.    Stage IIIa CKD: Based on current CrCl.  - Monitor BMP postoperatively, stop empagliflozin. If CrCl worse in AM, hold demadex.   HTN:  - Continue imdur.    T2DM: HbA1c 8.5%.  - Continue glargine 20u qHS, resistant SSI, add 6u TIDWC novolog. Eating well, stop IVF  BPH:  - Continue tamsulosin  HLD:  - Continue statin, zetia  Concern for sleep disorder, known OSA: I suspect this may have been construed as oversedation previously. It may be exaggerated in the current clinical setting.  - Needs referral to neurology. D/w pt and son. - Restrictions on driving, etc. discussed at length and will be reinforced.  - Continue CPAP qHS and would likely benefit from titration to r/o undertreatment  COPD: Quiescent.  - Continue scheduled BD, prn albuterol  [name], MD [facility] [email or web address] 06/12/2023, 4:03 PM

## 2023-06-12 NOTE — Progress Notes (Signed)
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    PT Comments  Continuing work on functional mobility and activity tolerance;  Notably smoother progression from supine to sit today, though still needing a considerable amount of assist; able to stand and walk further today, chair pulled behind for safety; Pt is concerned that his pain is not being managed fully, and that is making moblity and ADLs difficult; still, he is motivated and particiapting well; Patient will benefit from intensive inpatient follow up therapy, >3 hours/day     If plan is discharge home, recommend the following: A lot of help with walking and/or transfers;A lot of help with bathing/dressing/bathroom;Assistance with cooking/housework;Assist for transportation   Can travel by private vehicle        Equipment Recommendations  Rolling walker (2 wheels);BSC/3in1;Other (comment) (OT for bathroom recs)    Recommendations for Other Services       Precautions / Restrictions Precautions Precautions: Fall Precaution Comments: watch closely; possible narcolepsy Restrictions Weight Bearing Restrictions: No LLE Weight Bearing: Weight bearing as tolerated     Mobility  Bed Mobility Overal bed mobility: Needs Assistance Bed Mobility: Supine to Sit     Supine to sit: Mod assist     General bed mobility comments: Mod assist for LLE management, and cues for technqiue; smoother progression    Transfers Overall transfer level: Needs  assistance Equipment used: Rolling walker (2 wheels) Transfers: Sit to/from Stand Sit to Stand: Mod assist           General transfer comment: stood from elevated bed; mod assist to rise and steady; cues for hand placement    Ambulation/Gait Ambulation/Gait assistance: Min assist Gait Distance (Feet): 9 Feet Assistive device: Rolling walker (2 wheels) Gait Pattern/deviations: Step-to pattern, Antalgic       General Gait Details: Cues for sequence and to self-monitor for activity tolerance; very slow steps   Stairs             Wheelchair Mobility     Tilt Bed    Modified Rankin (Stroke Patients Only)       Balance     Sitting balance-Leahy Scale: Poor Sitting balance - Comments: Antalgic lean sitting EOB Postural control: Right lateral lean   Standing balance-Leahy Scale: Poor Standing balance comment: reliant on RW                            Cognition Arousal/Alertness: Awake/alert Behavior During Therapy: WFL for tasks assessed/performed Overall Cognitive Status: Impaired/Different from baseline Area of Impairment: Memory, Following commands, Problem solving                     Memory: Decreased short-term memory Following Commands: Follows one step commands with increased time     Problem Solving: Requires verbal cues, Difficulty sequencing, Slow processing          Exercises      General Comments General comments (skin integrity, edema, etc.): BP checked several times during session by NT and nursing. BP soft with both manual and automatic  technique. IV bleeding while pt seated in recliner. Nursing aware and removed during session.      Pertinent Vitals/Pain Pain Assessment Pain Assessment: 0-10 Pain Score: 7  Pain Location: L hip Pain Descriptors / Indicators: Aching, Sore Pain Intervention(s): Monitored during session    Home Living Family/patient expects to be discharged to:: Private residence Living  Arrangements: Spouse/significant other Available Help at Discharge: Family Type of Home: House Home Access: Stairs to enter Entrance Stairs-Rails: None Secretary/administrator of Steps: 3   Home Layout: One level Home Equipment: Shower seat      Prior Function            PT Goals (current goals can now be found in the care plan section) Acute Rehab PT Goals Patient Stated Goal: be independent to be able to get home PT Goal Formulation: With patient Time For Goal Achievement: 06/25/23 Potential to Achieve Goals: Good Progress towards PT goals: Progressing toward goals    Frequency    Min 1X/week      PT Plan Current plan remains appropriate    Co-evaluation              AM-PAC PT "6 Clicks" Mobility   Outcome Measure  Help needed turning from your back to your side while in a flat bed without using bedrails?: A Little Help needed moving from lying on your back to sitting on the side of a flat bed without using bedrails?: A Lot Help needed moving to and from a bed to a chair (including a wheelchair)?: A Lot Help needed standing up from a chair using your arms (e.g., wheelchair or bedside chair)?: A Lot Help needed to walk in hospital room?: A Lot Help needed climbing 3-5 steps with a railing? : A Lot 6 Click Score: 13    End of Session Equipment Utilized During Treatment: Gait belt Activity Tolerance: Patient tolerated treatment well Patient left: in chair;with call bell/phone within reach;with chair alarm set Nurse Communication: Mobility status;Patient requests pain meds PT Visit Diagnosis: Unsteadiness on feet (R26.81);Other abnormalities of gait and mobility (R26.89);Pain Pain - Right/Left: Left Pain - part of body: Hip     Time: 1240-1311 PT Time Calculation (min) (ACUTE ONLY): 31 min  Charges:    $Gait Training: 8-22 mins $Therapeutic Activity: 8-22 mins PT General Charges $$ ACUTE PT VISIT: 1 Visit                     Van Clines, PT   Acute Rehabilitation Services Office [phone] Secure Chat welcomed    Dylan Bruce 06/12/2023, 5:50 PM

## 2023-06-12 NOTE — Care Management Important Message (Signed)
Important Message  Patient Details  Name: Dylan Bruce MRN: [id number] Date of Birth: 01-Oct-1946   Medicare Important Message Given:  Yes     Renie Ora 06/12/2023, 4:26 PM

## 2023-06-12 NOTE — Progress Notes (Signed)
Subjective: 2 Days Post-Op s/p Procedure(s): INTRAMEDULLARY (IM) NAIL INTERTROCHANTERIC   Patient is alert, oriented. States pain is improved since increase from hydrocodone to oxycodone yesterday. He takes percocet 10/325 3-4 times a day at baseline.  Denies chest pain, SOB, Calf pain. No nausea/vomiting. No other complaints.     Objective:  PE: VITALS:   Vitals:   06/11/23 1546 06/11/23 2003 06/11/23 2017 06/12/23 0420  BP: 122/67  139/72 (!) 153/78  Pulse: 70  72 (!) 49  Resp: 16  18 17   Temp: 98.1 F (36.7 C)  97.6 F (36.4 C) 98.4 F (36.9 C)  TempSrc: Oral  Oral Oral  SpO2: 100% 100% 100% 98%  Weight:      Height:       General: sitting up in bed in no acute distress MSK:  Sensation intact distally Intact pulses distally Dorsiflexion/Plantar flexion intact Incision: dressing C/D/I  LABS  Results for orders placed or performed during the hospital encounter of 06/09/23 (from the past 24 hour(s))  Glucose, capillary     Status: Abnormal   Collection Time: 06/11/23  7:56 AM  Result Value Ref Range   Glucose-Capillary 103 (H) 70 - 99 mg/dL  Glucose, capillary     Status: Abnormal   Collection Time: 06/11/23 12:01 PM  Result Value Ref Range   Glucose-Capillary 298 (H) 70 - 99 mg/dL   Comment 1 Notify RN   Glucose, capillary     Status: Abnormal   Collection Time: 06/11/23  4:46 PM  Result Value Ref Range   Glucose-Capillary 223 (H) 70 - 99 mg/dL   Comment 1 Notify RN   Glucose, capillary     Status: Abnormal   Collection Time: 06/11/23  8:16 PM  Result Value Ref Range   Glucose-Capillary 227 (H) 70 - 99 mg/dL  CBC     Status: Abnormal   Collection Time: 06/11/23 11:44 PM  Result Value Ref Range   WBC 11.0 (H) 4.0 - 10.5 K/uL   RBC 3.93 (L) 4.22 - 5.81 MIL/uL   Hemoglobin 10.0 (L) 13.0 - 17.0 g/dL   HCT 27.2 (L) 53.6 - 64.4 %   MCV 82.2 80.0 - 100.0 fL   MCH 25.4 (L) 26.0 - 34.0 pg   MCHC 31.0 30.0 - 36.0 g/dL   RDW 03.4 (H) 74.2 - 59.5 %    Platelets 279 150 - 400 K/uL   nRBC 0.0 0.0 - 0.2 %  Basic metabolic panel     Status: Abnormal   Collection Time: 06/11/23 11:44 PM  Result Value Ref Range   Sodium 137 135 - 145 mmol/L   Potassium 3.8 3.5 - 5.1 mmol/L   Chloride 104 98 - 111 mmol/L   CO2 22 22 - 32 mmol/L   Glucose, Bld 337 (H) 70 - 99 mg/dL   BUN 36 (H) 8 - 23 mg/dL   Creatinine, Ser 6.38 (H) 0.61 - 1.24 mg/dL   Calcium 8.3 (L) 8.9 - 10.3 mg/dL   GFR, Estimated 56 (L) >60 mL/min   Anion gap 11 5 - 15    VAS [place] ABI WITH/WO TBI  Result Date: 06/11/2023  LOWER EXTREMITY DOPPLER STUDY Patient Name:  Dylan Bruce  Date of Exam:   06/11/2023 Medical Rec #: [id number]       Accession #:    [id number] Date of Birth: June 02, 1946       Patient Gender: M Patient Age:   77 years Exam Location:  [facility]  Procedure:      VAS [place] ABI WITH/WO TBI Referring Phys: Janine Ores --------------------------------------------------------------------------------  Indications: Ulceration. High Risk Factors: Hypertension, hyperlipidemia, Diabetes, no history of                    smoking, coronary artery disease.  Comparison Study: Previous 09/03/18 Performing Technologist: McKayla Maag RVT, [place]  Examination Guidelines: A complete evaluation includes at minimum, Doppler waveform signals and systolic blood pressure reading at the level of bilateral brachial, anterior tibial, and posterior tibial arteries, when vessel segments are accessible. Bilateral testing is considered an integral part of a complete examination. Photoelectric Plethysmograph (PPG) waveforms and toe systolic pressure readings are included as required and additional duplex testing as needed. Limited examinations for reoccurring indications may be performed as noted.  ABI Findings: +---------+------------------+-----+---------+----------------+ Right    Rt Pressure (mmHg)IndexWaveform Comment          +---------+------------------+-----+---------+----------------+ Brachial  118                    triphasic                 +---------+------------------+-----+---------+----------------+ PTA      164               1.39 triphasic                 +---------+------------------+-----+---------+----------------+ DP       255               2.16 triphasicNon-compressible +---------+------------------+-----+---------+----------------+ Great Toe34                0.29 Abnormal                  +---------+------------------+-----+---------+----------------+ +---------+------------------+-----+---------+---------------------------------+ Left     Lt Pressure (mmHg)IndexWaveform Comment                           +---------+------------------+-----+---------+---------------------------------+ Brachial                        triphasicNot pressure obtained due to IV                                            placement                         +---------+------------------+-----+---------+---------------------------------+ PTA      156               1.32 triphasic                                  +---------+------------------+-----+---------+---------------------------------+ DP       118               1.00 triphasic                                  +---------+------------------+-----+---------+---------------------------------+ Great Toe52                0.44 Abnormal                                   +---------+------------------+-----+---------+---------------------------------+ +-------+----------------+-----------+------------+----------------+  ABI/TBIToday's ABI     Today's TBIPrevious ABIPrevious TBI     +-------+----------------+-----------+------------+----------------+ Right  Non-compressible0.29       0.91        Non-compressible +-------+----------------+-----------+------------+----------------+ Left   1.32            0.44       1.01        Non-compressible  +-------+----------------+-----------+------------+----------------+  Summary: Right: Resting right ankle-brachial index indicates noncompressible right lower extremity arteries. The right toe-brachial index is abnormal. Left: Resting left ankle-brachial index is within normal range. The left toe-brachial index is abnormal. *See table(s) above for measurements and observations.  Electronically signed by Gerarda Fraction on 06/11/2023 at 7:45:32 PM.    Final    DG HIP UNILAT WITH PELVIS 2-3 VIEWS LEFT  Result Date: 06/10/2023 CLINICAL DATA:  Status post ORIF of the left femur. EXAM: DG HIP (WITH OR WITHOUT PELVIS) 2-3V LEFT COMPARISON:  06/09/2023 FINDINGS: Five images obtained via portable C-arm radiography in the operating room show open reduction and internal fixation of the proximal left femur fracture. Placement of IM nail and hip screw noted. Hardware components are in anatomic alignment. IMPRESSION: Status post ORIF of the proximal left femur fracture. Electronically Signed   By: Signa Kell M.D.   On: 06/10/2023 12:33   DG Hip Port Unilat With Pelvis 1V Left  Result Date: 06/10/2023 CLINICAL DATA:  Left femur fracture status post ORIF EXAM: DG HIP (WITH OR WITHOUT PELVIS) 1V PORT LEFT COMPARISON:  06/09/23 FINDINGS: Postop change from ORIF of the proximal left femur fracture. There is been interval placement of IM nail and hip screw. Hardware components and fracture fragments are in anatomic alignment. IMPRESSION: Postop change from ORIF of the proximal left femur fracture. Electronically Signed   By: Signa Kell M.D.   On: 06/10/2023 12:05   DG C-Arm 1-60 Min-No Report  Result Date: 06/10/2023 Fluoroscopy was utilized by the requesting physician.  No radiographic interpretation.    Assessment/Plan:  2 Days Post-Op s/p Procedure(s): INTRAMEDULLARY (IM) NAIL INTERTROCHANTERIC  Weightbearing: WBAT LLE Insicional and dressing care: Reinforce dressings as needed VTE prophylaxis: Home Eliquis  restarted  Pain control: continue current regimen Follow - up plan: 2 weeks with Dr. [name] Dispo: pending possible CIR placement, ok to discharge to CIR when cleared by medicine  Contact information:   Janine Ores, PA-C Weekdays 8-5  After hours and holidays please check [email or web address] for group call information for [facility]  Armida Sans 06/12/2023, 7:27 AM

## 2023-06-12 NOTE — Progress Notes (Signed)
Inpatient Rehabilitation Admissions Coordinator   I await OT eval and therapy progress to begin discussions concerning rehab venue options.  Ottie Glazier, RN, MSN Rehab Admissions Coordinator [phone] 06/12/2023 4:39 PM

## 2023-06-13 DIAGNOSIS — S72002A Fracture of unspecified part of neck of left femur, initial encounter for closed fracture: Secondary | ICD-10-CM | POA: Diagnosis not present

## 2023-06-13 LAB — GLUCOSE, CAPILLARY
Glucose-Capillary: 279 mg/dL — ABNORMAL HIGH (ref 70–99)
Glucose-Capillary: 229 mg/dL — ABNORMAL HIGH (ref 70–99)
Glucose-Capillary: 219 mg/dL — ABNORMAL HIGH (ref 70–99)
Glucose-Capillary: 225 mg/dL — ABNORMAL HIGH (ref 70–99)

## 2023-06-13 MED ORDER — INSULIN GLARGINE-YFGN 100 UNIT/ML ~~LOC~~ SOLN
35.0000 [IU] | Freq: Every day | SUBCUTANEOUS | Status: DC
  Administered 2023-06-13: 35 [IU] via SUBCUTANEOUS
  Filled 2023-06-13 (×2): qty 0.35

## 2023-06-13 MED ORDER — MODAFINIL 100 MG PO TABS
100.0000 mg | ORAL_TABLET | Freq: Every day | ORAL | Status: DC
  Administered 2023-06-14 – 2023-06-16 (×3): 100 mg via ORAL
  Filled 2023-06-13 (×3): qty 1

## 2023-06-13 NOTE — Progress Notes (Signed)
  Inpatient Rehabilitation Admissions Coordinator   Met with patient and son at bedside for rehab assessment. We discussed goals and expectations of a possible CIR admit. They prefer CIR for rehab. Wife works ,but other family can provide assist at discharge. Patient's insurance is [place] health Care primary and Medicare secondary. I will ask rehab MD to consult to assist with obtaining insurance approval for Cir. I will begin Auth with commercial [employer] today. Policy number is [id number]. Please call me with any questions.   Ottie Glazier, RN, MSN Rehab Admissions Coordinator [phone]

## 2023-06-13 NOTE — Progress Notes (Signed)
[name]  Dylan Bruce (DOB: 1945-11-18) KVQ:[id number] PCP: Lindwood Qua, MD  Brief Narrative: Dylan Bruce is a 77 y.o. male with a history of AS s/p TAVR, T2DM, HTN, frequent falls who was transferred from [name] on 8/3 to [facility] for orthopedic evaluation. He had fallen at a PT session, found to have left rib fracture initially and returned the next day to their ED with hip pain found to have intertrochanteric left hip fracture for which he requested admission to [facility]. He had IM nail placement 8/4 by Dr. [name].   Subjective: Had chest discomfort for which I assessed him yesterday afternoon, this resolved with maalox. Work up negative for ACS. He has had no recurrence, feels well this morning.  Objective: BP 129/70 (BP Location: Right Arm)   Pulse 70   Temp 97.8 F (36.6 C) (Oral)   Resp 17   Ht 6\' 2"  (1.88 m)   Wt 90.2 kg   SpO2 97%   BMI 25.53 kg/m   Gen: No distress, older gentleman  Pulm: Clear, nonlabored  CV: RRR, no MRG or pitting edema GI: Soft, NT, ND, +BS Neuro: Alert and oriented, no drowsiness. No new focal deficits. Ext: Warm, dry, R shin dressing is c/d/I, scattered ecchymoses on RUE are stable. L thigh wound dressing is c/d/I with stable surrounding ecchymosis Skin: No new rashes, lesions or ulcers on visualized skin   Assessment & Plan: Intertrochanteric left hip fracture: s/p IM nail 8/4 by Dr. [name].  - Eliquis for VTE ppx - Pain control per orthopedics, augmented dosing based on opioid tolerance/dependence PTA. Continue home lyrica.  - WBAT, PT/OT to eval 8/5 > recommending CIR, formal consult placed. Pt's medical issues are stabilized and he is stable for transfer once insurance authorization obtained and bed available.    Right lower leg wound in presence of venous insufficiency:  - No longer appears infected, DC abx.  - Local wound care.   - Tx venous insufficiency w/compression/elevation   Fracture of multiple ribs of left  side:  - Continue pain control, incentive spirometry.   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 Normal function and LVEF June 2024.  - Continue plavix, continue home meds including isosorbide and demadex.   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 - Needs podiatry follow up   PAF: Has consistently been in 4:1 atrial flutter.  - Resumed eliquis postoperatively. We have discussed r/b/a with his frequent falls including son in discussion. We will continue DOAC for now.  - No rate control agents on PTA med list, monitor HR.    Atypical chest discomfort: Developed 8/6. Troponins reassuring 23 > 22, ECG without ST elevations, not provoked by exertion. Pt has had postoperative flatulence and these symptoms improved with maalox, so suspect GI etiology.  - Continue monitoring, continue home imdur, continue prn maalox.   Stage IIIa CKD: Based on current CrCl.  - Monitor BMP postoperatively, stopped empagliflozin. CrCl stable, will continue torsemide.   HTN:  - Continue imdur.    T2DM: HbA1c 8.5%.  - Continue glargine, augment to 35u qHS, continue augmented mealtime novolog (10u TIDWC) and resistant SSI.   BPH:  - Continue tamsulosin  HLD:  - Continue statin, zetia  Concern for sleep disorder, known OSA: Pt seems to fall asleep abruptly inappropriately and return to full wakefulness abruptly once disturbed.  This has been more chronic and worsening, likely exaggerated in the perioperative period. He denies daytime sleepiness. - Needs referral to neurology. D/w pt and son. - Restrictions on driving, etc. discussed at length and will be reinforced.  - Continue CPAP qHS and would likely benefit from titration to r/o undertreatment  COPD: Quiescent.  - Continue scheduled BD, prn albuterol  [name], MD [facility] [email or web address] 06/13/2023, 10:57 AM

## 2023-06-13 NOTE — Progress Notes (Signed)
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    PT Comments  Continuing work on functional mobility and activity tolerance;  Session focused on therapeutic exercise today for increasing muscle activation and muscle control about L hip to improve strength and functional mobility; overall tolerated session well, including very nice pulls on incentive spirometry; Pt is hopeful for AIR stay to maximize independence and safety with mobility and ADLs in prep for getting home    If plan is discharge home, recommend the following: A lot of help with walking and/or transfers;A lot of help with bathing/dressing/bathroom;Assistance with cooking/housework;Assist for transportation   Can travel by private vehicle        Equipment Recommendations  Rolling walker (2 wheels);BSC/3in1;Other (comment)    Recommendations for Other Services       Precautions / Restrictions Precautions Precautions: Fall Precaution Comments: watch closely; possible narcolepsy Restrictions LLE Weight Bearing: Weight bearing as tolerated     Balance                                            Cognition                                                Exercises Total Joint Exercises Ankle Circles/Pumps: AROM, Both, 20 reps Quad Sets: AROM, Left, 10 reps Short Arc Quad: AAROM, AROM, Left, 10 reps Heel Slides: AAROM, Left, 10 reps (self-AAROM using gait belt) Hip ABduction/ADduction: AAROM, Left, 10  reps (pt used gait belt to assist) Other Exercises Other Exercises: Perfomed incentive spirometry well; 10 reps    General Comments General comments (skin integrity, edema, etc.): Pt able to use the cane in his room to help with placing the gait belt on his L foot      Pertinent Vitals/Pain Pain Assessment Pain Assessment: Faces Faces Pain Scale: Hurts little more Pain Location: L hip Pain Descriptors / Indicators: Aching, Sore Pain Intervention(s): Monitored during session, Premedicated before session    Home Living                          Prior Function            PT Goals (current goals can now be found in the care plan section) Acute Rehab PT Goals Patient Stated Goal: be independent to be able to get home PT Goal Formulation: With patient Time For Goal Achievement: 06/25/23 Potential to Achieve Goals: Good Progress towards PT goals: Progressing toward goals    Frequency    Min 1X/week      PT Plan Current plan remains appropriate    Co-evaluation              AM-PAC PT "6 Clicks" Mobility   Outcome Measure  Help needed turning from your back to your side while in a  flat bed without using bedrails?: A Little Help needed moving from lying on your back to sitting on the side of a flat bed without using bedrails?: A Lot Help needed moving to and from a bed to a chair (including a wheelchair)?: A Lot Help needed standing up from a chair using your arms (e.g., wheelchair or bedside chair)?: A Lot Help needed to walk in hospital room?: A Lot Help needed climbing 3-5 steps with a railing? : A Lot 6 Click Score: 13    End of Session Equipment Utilized During Treatment: Gait belt Activity Tolerance: Patient tolerated treatment well Patient left: in bed;with call bell/phone within reach;with family/visitor present Nurse Communication: Mobility status PT Visit Diagnosis: Unsteadiness on feet (R26.81);Other abnormalities of gait and mobility  (R26.89);Pain Pain - Right/Left: Left Pain - part of body: Hip     Time: 1236-1300 PT Time Calculation (min) (ACUTE ONLY): 24 min  Charges:    $Therapeutic Exercise: 23-37 mins PT General Charges $$ ACUTE PT VISIT: 1 Visit                    Van Clines, PT  Acute Rehabilitation Services Office [phone] Secure Chat welcomed    Levi Aland 06/13/2023, 4:12 PM

## 2023-06-13 NOTE — Plan of Care (Signed)
  Problem: Education: Goal: Knowledge of General Education information will improve Description: Including pain rating scale, medication(s)/side effects and non-pharmacologic comfort measures Outcome: Progressing   Problem: Clinical Measurements: Goal: Ability to maintain clinical measurements within normal limits will improve Outcome: Progressing Goal: Cardiovascular complication will be avoided Outcome: Progressing   Problem: Activity: Goal: Risk for activity intolerance will decrease Outcome: Progressing   Problem: Coping: Goal: Level of anxiety will decrease Outcome: Progressing   Problem: Elimination: Goal: Will not experience complications related to bowel motility Outcome: Progressing   Problem: Pain Managment: Goal: General experience of comfort will improve Outcome: Progressing   Problem: Skin Integrity: Goal: Risk for impaired skin integrity will decrease Outcome: Progressing   Problem: Fluid Volume: Goal: Ability to maintain a balanced intake and output will improve Outcome: Progressing   Problem: Metabolic: Goal: Ability to maintain appropriate glucose levels will improve Outcome: Progressing   Problem: Skin Integrity: Goal: Risk for impaired skin integrity will decrease Outcome: Progressing

## 2023-06-13 NOTE — Plan of Care (Signed)
  Problem: Education: Goal: Knowledge of General Education information will improve Description: Including pain rating scale, medication(s)/side effects and non-pharmacologic comfort measures Outcome: Adequate for Discharge   Problem: Health Behavior/Discharge Planning: Goal: Ability to manage health-related needs will improve Outcome: Adequate for Discharge   Problem: Clinical Measurements: Goal: Ability to maintain clinical measurements within normal limits will improve Outcome: Adequate for Discharge Goal: Will remain free from infection Outcome: Adequate for Discharge Goal: Diagnostic test results will improve Outcome: Adequate for Discharge Goal: Respiratory complications will improve Outcome: Adequate for Discharge Goal: Cardiovascular complication will be avoided Outcome: Adequate for Discharge   Problem: Activity: Goal: Risk for activity intolerance will decrease Outcome: Adequate for Discharge   Problem: Nutrition: Goal: Adequate nutrition will be maintained Outcome: Adequate for Discharge   Problem: Coping: Goal: Level of anxiety will decrease Outcome: Adequate for Discharge   Problem: Elimination: Goal: Will not experience complications related to bowel motility Outcome: Adequate for Discharge Goal: Will not experience complications related to urinary retention Outcome: Adequate for Discharge   Problem: Pain Managment: Goal: General experience of comfort will improve Outcome: Adequate for Discharge   Problem: Safety: Goal: Ability to remain free from injury will improve Outcome: Adequate for Discharge   Problem: Skin Integrity: Goal: Risk for impaired skin integrity will decrease Outcome: Adequate for Discharge   Problem: Education: Goal: Ability to describe self-care measures that may prevent or decrease complications (Diabetes Survival Skills Education) will improve Outcome: Adequate for Discharge Goal: Individualized Educational Video(s) Outcome:  Adequate for Discharge   Problem: Coping: Goal: Ability to adjust to condition or change in health will improve Outcome: Adequate for Discharge   Problem: Fluid Volume: Goal: Ability to maintain a balanced intake and output will improve Outcome: Adequate for Discharge   Problem: Health Behavior/Discharge Planning: Goal: Ability to identify and utilize available resources and services will improve Outcome: Adequate for Discharge Goal: Ability to manage health-related needs will improve Outcome: Adequate for Discharge   Problem: Metabolic: Goal: Ability to maintain appropriate glucose levels will improve Outcome: Adequate for Discharge   Problem: Nutritional: Goal: Maintenance of adequate nutrition will improve Outcome: Adequate for Discharge Goal: Progress toward achieving an optimal weight will improve Outcome: Adequate for Discharge   Problem: Skin Integrity: Goal: Risk for impaired skin integrity will decrease Outcome: Adequate for Discharge   Problem: Tissue Perfusion: Goal: Adequacy of tissue perfusion will improve Outcome: Adequate for Discharge   Problem: Education: Goal: Verbalization of understanding the information provided (i.e., activity precautions, restrictions, etc) will improve Outcome: Adequate for Discharge Goal: Individualized Educational Video(s) Outcome: Adequate for Discharge   Problem: Activity: Goal: Ability to ambulate and perform ADLs will improve Outcome: Adequate for Discharge   Problem: Clinical Measurements: Goal: Postoperative complications will be avoided or minimized Outcome: Adequate for Discharge   Problem: Self-Concept: Goal: Ability to maintain and perform role responsibilities to the fullest extent possible will improve Outcome: Adequate for Discharge   Problem: Pain Management: Goal: Pain level will decrease Outcome: Adequate for Discharge

## 2023-06-13 NOTE — Progress Notes (Signed)
Subjective: 3 Days Post-Op s/p Procedure(s): INTRAMEDULLARY (IM) NAIL INTERTROCHANTERIC   Patient is alert, oriented. States pain well controlled. He takes percocet 10/325 3-4 times a day at baseline.  Denies chest pain, SOB, Calf pain. No nausea/vomiting. No other complaints.     Objective:  PE: VITALS:   Vitals:   06/12/23 2032 06/12/23 2323 06/13/23 0349 06/13/23 0752  BP: (!) 112/57 (!) 155/80 139/77 129/70  Pulse: 61 67 69 70  Resp:  16 18 17   Temp: 98.4 F (36.9 C) 98.3 F (36.8 C) 98.5 F (36.9 C) 97.8 F (36.6 C)  TempSrc: Oral Oral Oral Oral  SpO2: 98% 96% 92% 97%  Weight:      Height:       General: sitting up in bed in no acute distress MSK:  Sensation intact distally Intact pulses distally Dorsiflexion/Plantar flexion intact Incision: dressing C/D/I  LABS  Results for orders placed or performed during the hospital encounter of 06/09/23 (from the past 24 hour(s))  Glucose, capillary     Status: Abnormal   Collection Time: 06/12/23  4:36 PM  Result Value Ref Range   Glucose-Capillary 139 (H) 70 - 99 mg/dL  Troponin I (High Sensitivity)     Status: Abnormal   Collection Time: 06/12/23  5:41 PM  Result Value Ref Range   Troponin I (High Sensitivity) 23 (H) <18 ng/L  Troponin I (High Sensitivity)     Status: Abnormal   Collection Time: 06/12/23  8:02 PM  Result Value Ref Range   Troponin I (High Sensitivity) 22 (H) <18 ng/L  Glucose, capillary     Status: Abnormal   Collection Time: 06/12/23 10:22 PM  Result Value Ref Range   Glucose-Capillary 237 (H) 70 - 99 mg/dL  CBC     Status: Abnormal   Collection Time: 06/13/23 12:08 AM  Result Value Ref Range   WBC 9.4 4.0 - 10.5 K/uL   RBC 4.11 (L) 4.22 - 5.81 MIL/uL   Hemoglobin 10.2 (L) 13.0 - 17.0 g/dL   HCT 40.9 (L) 81.1 - 91.4 %   MCV 80.8 80.0 - 100.0 fL   MCH 24.8 (L) 26.0 - 34.0 pg   MCHC 30.7 30.0 - 36.0 g/dL   RDW 78.2 (H) 95.6 - 21.3 %   Platelets 299 150 - 400 K/uL   nRBC 0.0 0.0 -  0.2 %  Basic metabolic panel     Status: Abnormal   Collection Time: 06/13/23 12:08 AM  Result Value Ref Range   Sodium 135 135 - 145 mmol/L   Potassium 4.3 3.5 - 5.1 mmol/L   Chloride 102 98 - 111 mmol/L   CO2 24 22 - 32 mmol/L   Glucose, Bld 287 (H) 70 - 99 mg/dL   BUN 39 (H) 8 - 23 mg/dL   Creatinine, Ser 0.86 (H) 0.61 - 1.24 mg/dL   Calcium 8.4 (L) 8.9 - 10.3 mg/dL   GFR, Estimated 54 (L) >60 mL/min   Anion gap 9 5 - 15  Glucose, capillary     Status: Abnormal   Collection Time: 06/13/23  7:51 AM  Result Value Ref Range   Glucose-Capillary 279 (H) 70 - 99 mg/dL  Glucose, capillary     Status: Abnormal   Collection Time: 06/13/23 12:17 PM  Result Value Ref Range   Glucose-Capillary 229 (H) 70 - 99 mg/dL    VAS [place] ABI WITH/WO TBI  Result Date: 06/11/2023  LOWER EXTREMITY DOPPLER STUDY Patient Name:  Dylan Bruce  Date of Exam:   06/11/2023 Medical Rec #: [id number]       Accession #:    [id number] Date of Birth: 06-14-1946       Patient Gender: M Patient Age:   77 years Exam Location:  [facility] Procedure:      VAS [place] ABI WITH/WO TBI Referring Phys: Janine Ores --------------------------------------------------------------------------------  Indications: Ulceration. High Risk Factors: Hypertension, hyperlipidemia, Diabetes, no history of                    smoking, coronary artery disease.  Comparison Study: Previous 09/03/18 Performing Technologist: McKayla Maag RVT, [place]  Examination Guidelines: A complete evaluation includes at minimum, Doppler waveform signals and systolic blood pressure reading at the level of bilateral brachial, anterior tibial, and posterior tibial arteries, when vessel segments are accessible. Bilateral testing is considered an integral part of a complete examination. Photoelectric Plethysmograph (PPG) waveforms and toe systolic pressure readings are included as required and additional duplex testing as needed. Limited examinations for reoccurring  indications may be performed as noted.  ABI Findings: +---------+------------------+-----+---------+----------------+ Right    Rt Pressure (mmHg)IndexWaveform Comment          +---------+------------------+-----+---------+----------------+ Brachial 118                    triphasic                 +---------+------------------+-----+---------+----------------+ PTA      164               1.39 triphasic                 +---------+------------------+-----+---------+----------------+ DP       255               2.16 triphasicNon-compressible +---------+------------------+-----+---------+----------------+ Great Toe34                0.29 Abnormal                  +---------+------------------+-----+---------+----------------+ +---------+------------------+-----+---------+---------------------------------+ Left     Lt Pressure (mmHg)IndexWaveform Comment                           +---------+------------------+-----+---------+---------------------------------+ Brachial                        triphasicNot pressure obtained due to IV                                            placement                         +---------+------------------+-----+---------+---------------------------------+ PTA      156               1.32 triphasic                                  +---------+------------------+-----+---------+---------------------------------+ DP       118               1.00 triphasic                                  +---------+------------------+-----+---------+---------------------------------+ Barry Dienes  0.44 Abnormal                                   +---------+------------------+-----+---------+---------------------------------+ +-------+----------------+-----------+------------+----------------+ ABI/TBIToday's ABI     Today's TBIPrevious ABIPrevious TBI     +-------+----------------+-----------+------------+----------------+ Right   Non-compressible0.29       0.91        Non-compressible +-------+----------------+-----------+------------+----------------+ Left   1.32            0.44       1.01        Non-compressible +-------+----------------+-----------+------------+----------------+  Summary: Right: Resting right ankle-brachial index indicates noncompressible right lower extremity arteries. The right toe-brachial index is abnormal. Left: Resting left ankle-brachial index is within normal range. The left toe-brachial index is abnormal. *See table(s) above for measurements and observations.  Electronically signed by Gerarda Fraction on 06/11/2023 at 7:45:32 PM.    Final     Assessment/Plan:  3 Days Post-Op s/p Procedure(s): INTRAMEDULLARY (IM) NAIL INTERTROCHANTERIC  Weightbearing: WBAT LLE Insicional and dressing care: Reinforce dressings as needed VTE prophylaxis: Home Eliquis restarted  Pain control: continue current regimen Follow - up plan: 2 weeks with Dr. [name] Dispo: pending possible CIR placement, ok to discharge to CIR when cleared by medicine  Contact information:   Janine Ores, PA-C Weekdays 8-5  After hours and holidays please check [email or web address] for group call information for [facility]  Armida Sans 06/13/2023, 2:11 PM

## 2023-06-13 NOTE — Consult Note (Signed)
Physical Medicine and Rehabilitation Consult Reason for Consult:Closed left hip fracture Referring Physician: Hazeline Junker, MD   HPI: Dylan Bruce is a 77 y.o. male with a history of AS s/p TAVR, type 2 DM, HTN, frequent falls, who was transferred from [facility] on 8/3 to [facility] for orthopedic eval. He had fallen at a PT session and was found  to have a left rib fracture initially, and returned the next day to their ED with hip pain and was found to have an intertrochanteric left hip fracture for which he requested admission to [facility]. He has IM nail placement on 8/4 by Dr. [name]. Physical Medicine & Rehabilitation was consulted to assess candidacy for CIR.     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 Clifton James and Dr. Delia Chimes   SLEEP APNEA    SPINA BIFIDA    Past Surgical History:  Procedure Laterality Date   BACK SURGERY     CARPAL TUNNEL RELEASE Left 08/21/2019   Procedure: CARPAL TUNNEL RELEASE;  Surgeon: Cindee Salt, MD;  Location: [facility];  Service: Orthopedics;  Laterality: Left;  AXILLARY BLOCK   CORONARY ARTERY BYPASS GRAFT     INTRAMEDULLARY (IM) NAIL INTERTROCHANTERIC Left 06/10/2023   Procedure: INTRAMEDULLARY (IM) NAIL INTERTROCHANTERIC;  Surgeon: Teryl Lucy, MD;  Location: [facility] OR;  Service: Orthopedics;  Laterality: Left;   INTRAOPERATIVE TRANSTHORACIC ECHOCARDIOGRAM N/A 02/21/2023   Procedure: INTRAOPERATIVE TRANSTHORACIC ECHOCARDIOGRAM;  Surgeon: Kathleene Hazel, MD;  Location: [facility];  Service: Open Heart Surgery;  Laterality: N/A;   JOINT REPLACEMENT Left    shoulder   LEFT HEART CATH AND CORS/GRAFTS ANGIOGRAPHY N/A 06/18/2018   Procedure:  LEFT HEART CATH AND CORS/GRAFTS ANGIOGRAPHY;  Surgeon: Runell Gess, MD;  Location: [facility];  Service: Cardiovascular;  Laterality: N/A;   RIGHT/LEFT HEART CATH AND CORONARY/GRAFT ANGIOGRAPHY N/A 01/11/2023   Procedure: RIGHT/LEFT HEART CATH AND CORONARY/GRAFT ANGIOGRAPHY;  Surgeon: Kathleene Hazel, MD;  Location: [facility];  Service: Cardiovascular;  Laterality: N/A;   TRANSCATHETER AORTIC VALVE REPLACEMENT, TRANSFEMORAL Left 02/21/2023   Procedure: Transcatheter Aortic Valve Replacement, Transfemoral;  Surgeon: Kathleene Hazel, MD;  Location: [facility];  Service: Open Heart Surgery;  Laterality: Left;   TRIGGER FINGER RELEASE Left 08/21/2019   Procedure: RELEASE TRIGGER LEFT SMALL FINGER LEFT INDEX;  Surgeon: Cindee Salt, MD;  Location: [facility];  Service: Orthopedics;  Laterality: Left;   ULNAR NERVE TRANSPOSITION Left 08/21/2019   Procedure: DECOMPRESSION WITH ULNAR NERVE LEFT CUBITAL TUNNEL ULNAR;  Surgeon: Cindee Salt, MD;  Location: [facility];  Service: Orthopedics;  Laterality: Left;   Family History  Problem Relation Age of Onset   Valvular heart disease Father    Prostate cancer Father    Social History:  reports that he has never smoked. He has never used smokeless tobacco. He reports that he does not currently use alcohol. He reports that he does not use drugs. Allergies:  Allergies  Allergen Reactions   Cefuroxime Diarrhea and Nausea And Vomiting  Severe. Can take Ancef 2g IV on 02/21/2023 without reporting any issue   Ibuprofen Anaphylaxis and Shortness Of Breath        Iodinated Contrast Media Shortness Of Breath and Other (See Comments)    Pt developed burning of face and SOB after 02/06/2023 CT scan    Cefpodoxime Rash   Ciprofloxacin Rash   Doxycycline Rash   Sulfamethoxazole-Trimethoprim Rash   Fluocinolone Other (See Comments)    Unknown reaction   Probenecid Other (See Comments)     Unknown reaction   Allopurinol Rash   Azithromycin Rash   Sulfa Antibiotics Rash   Medications Prior to Admission  Medication Sig Dispense Refill   albuterol (PROVENTIL HFA;VENTOLIN HFA) 108 (90 Base) MCG/ACT inhaler Inhale 2 puffs into the lungs every 4 (four) hours as needed for wheezing.     [EXPIRED] amoxicillin-clavulanate (AUGMENTIN) 875-125 MG tablet Take 1 tablet by mouth 2 (two) times daily.     apixaban (ELIQUIS) 5 MG TABS tablet Take 1 tablet (5 mg total) by mouth 2 (two) times daily. 120 tablet 2   atorvastatin (LIPITOR) 80 MG tablet Take 1 tablet (80 mg total) by mouth daily. 30 tablet 0   budesonide-formoterol (SYMBICORT) 160-4.5 MCG/ACT inhaler Inhale 2 puffs into the lungs in the morning and at bedtime.     clobetasol cream (TEMOVATE) 0.05 % Apply 1 application topically daily as needed (rash).      clopidogrel (PLAVIX) 75 MG tablet Take 1 tablet (75 mg total) by mouth daily. 90 tablet 3   COSENTYX SENSOREADY, 300 MG, 150 MG/ML SOAJ Inject 300 mg into the skin every 30 (thirty) days.     cyclobenzaprine (FLEXERIL) 10 MG tablet Take 10 mg by mouth at bedtime.      desonide (DESOWEN) 0.05 % cream Apply 1 application topically daily as needed (rash).      docusate sodium (COLACE) 100 MG capsule Take 100 mg by mouth daily.      DOXYCYCLINE CALCIUM PO Take by mouth in the morning and at bedtime.     esomeprazole (NEXIUM) 40 MG capsule Take 40 mg by mouth daily before breakfast.     ezetimibe (ZETIA) 10 MG tablet Take 10 mg by mouth daily.     fenofibrate (TRICOR) 145 MG tablet Take 145 mg by mouth daily.     fluticasone (FLONASE) 50 MCG/ACT nasal spray Place 1 spray into both nostrils in the morning and at bedtime.     hydrocortisone (ANUCORT-HC) 25 MG suppository Place 1 suppository rectally 2 (two) times daily as needed for hemorrhoids.     hydrOXYzine (VISTARIL) 25 MG capsule Take 25 mg by mouth daily as needed for itching (sleep).     insulin lispro (HUMALOG KWIKPEN) 100  UNIT/ML KiwkPen Inject 18-24 Units into the skin 3 (three) times daily with meals.     isosorbide mononitrate (IMDUR) 30 MG 24 hr tablet Take 1 tablet (30 mg total) by mouth daily. Please schedule appointment for refills. 30 tablet 0   JARDIANCE 10 MG TABS tablet Take 10 mg by mouth daily.     LANTUS SOLOSTAR 100 UNIT/ML Solostar Pen Inject 22 Units into the skin at bedtime.  5   LYRICA 100 MG capsule Take 1 capsule (100 mg total) by mouth 2 (two) times daily.  5   magnesium oxide (MAG-OX) 400 (240 Mg) MG tablet Take 400 mg by mouth daily.     meclizine (ANTIVERT) 12.5 MG tablet Take 12.5 mg by mouth 3 (three) times daily as needed  for dizziness.     metFORMIN (GLUCOPHAGE) 1000 MG tablet Take 1,000 mg by mouth 2 (two) times daily.     mirtazapine (REMERON) 30 MG tablet Take 1 tablet by mouth at bedtime.      nitroGLYCERIN (NITROSTAT) 0.4 MG SL tablet Place 1 tablet (0.4 mg total) under the tongue every 5 (five) minutes as needed for chest pain. (Patient taking differently: Place 0.4 mg under the tongue daily as needed (for difficulty breathing d/t fluid in lungs).) 90 tablet 3   nystatin cream (MYCOSTATIN) Apply 1 Application topically 2 (two) times daily.     oxyCODONE-acetaminophen (PERCOCET) 10-325 MG per tablet Take 1-2 tablets by mouth 2 (two) times daily as needed for pain.      Semaglutide, 1 MG/DOSE, (OZEMPIC, 1 MG/DOSE,) 4 MG/3ML SOPN Inject 4 mg into the skin once a week.     SOOLANTRA 1 % CREA Apply 1 application  topically daily.  3   tamsulosin (FLOMAX) 0.4 MG CAPS capsule Take 0.4 capsules by mouth daily.     tiZANidine (ZANAFLEX) 4 MG tablet Take 4 mg by mouth at bedtime.     torsemide (DEMADEX) 20 MG tablet Take 20 mg by mouth daily.     triamcinolone cream (KENALOG) 0.1 % Apply 1 Application topically 2 (two) times daily as needed (for itching).     ULORIC 40 MG tablet Take 1 tablet by mouth Daily.     VIIBRYD 40 MG TABS Take 40 mg by mouth Daily.      Torsemide 40 MG TABS Take  40 mg by mouth daily. (Patient not taking: Reported on 06/10/2023) 60 tablet 2    Home: Home Living Family/patient expects to be discharged to:: Private residence Living Arrangements: Spouse/significant other Available Help at Discharge: Family Type of Home: House Home Access: Stairs to enter Secretary/administrator of Steps: 3 Entrance Stairs-Rails: None Home Layout: One level Bathroom Shower/Tub: Engineer, manufacturing systems: Standard Bathroom Accessibility: Yes Home Equipment: Banker History: Prior Function Prior Level of Function : Independent/Modified Independent Mobility Comments: Walks without assistive device ADLs Comments: independent Functional Status:  Mobility: Bed Mobility Overal bed mobility: Needs Assistance Bed Mobility: Supine to Sit Supine to sit: Mod assist Sit to sidelying: Mod assist General bed mobility comments: Mod assist for LLE management, and cues for technqiue; smoother progression Transfers Overall transfer level: Needs assistance Equipment used: Rolling walker (2 wheels) Transfers: Sit to/from Stand Sit to Stand: Mod assist Bed to/from chair/wheelchair/BSC transfer type:: Step pivot Step pivot transfers: Min assist General transfer comment: stood from elevated bed; mod assist to rise and steady; cues for hand placement Ambulation/Gait Ambulation/Gait assistance: Min assist Gait Distance (Feet): 9 Feet Assistive device: Rolling walker (2 wheels) Gait Pattern/deviations: Step-to pattern, Antalgic General Gait Details: Cues for sequence and to self-monitor for activity tolerance; very slow steps    ADL: ADL Overall ADL's : Needs assistance/impaired Eating/Feeding: Modified independent, Sitting Grooming: Oral care, Set up, Sitting Upper Body Bathing: Set up, Sitting Lower Body Bathing: Maximal assistance, Sit to/from stand Upper Body Dressing : Minimal assistance, Sitting Upper Body Dressing Details (indicate cue type  and reason): required assist to pull shirt down in the back and front while seated in recliner. Lower Body Dressing: Total assistance Lower Body Dressing Details (indicate cue type and reason): sitting in recliner. sock management  Cognition: Cognition Overall Cognitive Status: Impaired/Different from baseline Orientation Level: Oriented X4 Cognition Behavior During Therapy: WFL for tasks assessed/performed Overall Cognitive Status: Impaired/Different from baseline Area  of Impairment: Memory, Following commands, Problem solving Memory: Decreased short-term memory Following Commands: Follows one step commands with increased time Problem Solving: Requires verbal cues, Difficulty sequencing, Slow processing General Comments: Overall pleasant and hard-working; notable that had to repeat instructions often during session  Blood pressure 129/70, pulse 70, temperature 97.8 F (36.6 C), temperature source Oral, resp. rate 17, height 6\' 2"  (1.88 m), weight 90.2 kg, SpO2 97%. Physical Exam Gen: no distress, normal appearing HEENT: oral mucosa pink and moist, NCAT Cardio: Reg rate Chest: normal effort, normal rate of breathing Abd: soft, non-distended Ext: no edema Psych: pleasant, normal affect Skin: incision dressing c/d/I with ecchymosis Neuro: Alert and oriented x3 Musculoskeletal: LLE strength limited by pain  Results for orders placed or performed during the hospital encounter of 06/09/23 (from the past 24 hour(s))  Glucose, capillary     Status: Abnormal   Collection Time: 06/12/23 12:13 PM  Result Value Ref Range   Glucose-Capillary 255 (H) 70 - 99 mg/dL  Glucose, capillary     Status: Abnormal   Collection Time: 06/12/23  4:36 PM  Result Value Ref Range   Glucose-Capillary 139 (H) 70 - 99 mg/dL  Troponin I (High Sensitivity)     Status: Abnormal   Collection Time: 06/12/23  5:41 PM  Result Value Ref Range   Troponin I (High Sensitivity) 23 (H) <18 ng/L  Troponin I (High  Sensitivity)     Status: Abnormal   Collection Time: 06/12/23  8:02 PM  Result Value Ref Range   Troponin I (High Sensitivity) 22 (H) <18 ng/L  Glucose, capillary     Status: Abnormal   Collection Time: 06/12/23 10:22 PM  Result Value Ref Range   Glucose-Capillary 237 (H) 70 - 99 mg/dL  CBC     Status: Abnormal   Collection Time: 06/13/23 12:08 AM  Result Value Ref Range   WBC 9.4 4.0 - 10.5 K/uL   RBC 4.11 (L) 4.22 - 5.81 MIL/uL   Hemoglobin 10.2 (L) 13.0 - 17.0 g/dL   HCT 09.3 (L) 23.5 - 57.3 %   MCV 80.8 80.0 - 100.0 fL   MCH 24.8 (L) 26.0 - 34.0 pg   MCHC 30.7 30.0 - 36.0 g/dL   RDW 22.0 (H) 25.4 - 27.0 %   Platelets 299 150 - 400 K/uL   nRBC 0.0 0.0 - 0.2 %  Basic metabolic panel     Status: Abnormal   Collection Time: 06/13/23 12:08 AM  Result Value Ref Range   Sodium 135 135 - 145 mmol/L   Potassium 4.3 3.5 - 5.1 mmol/L   Chloride 102 98 - 111 mmol/L   CO2 24 22 - 32 mmol/L   Glucose, Bld 287 (H) 70 - 99 mg/dL   BUN 39 (H) 8 - 23 mg/dL   Creatinine, Ser 6.23 (H) 0.61 - 1.24 mg/dL   Calcium 8.4 (L) 8.9 - 10.3 mg/dL   GFR, Estimated 54 (L) >60 mL/min   Anion gap 9 5 - 15  Glucose, capillary     Status: Abnormal   Collection Time: 06/13/23  7:51 AM  Result Value Ref Range   Glucose-Capillary 279 (H) 70 - 99 mg/dL   VAS [place] ABI WITH/WO TBI  Result Date: 06/11/2023  LOWER EXTREMITY DOPPLER STUDY Patient Name:  Dylan Bruce  Date of Exam:   06/11/2023 Medical Rec #: [id number]       Accession #:    [id number] Date of Birth: 1945/11/28       Patient  Gender: M Patient Age:   72 years Exam Location:  [facility] Procedure:      VAS [place] ABI WITH/WO TBI Referring Phys: Janine Ores --------------------------------------------------------------------------------  Indications: Ulceration. High Risk Factors: Hypertension, hyperlipidemia, Diabetes, no history of                    smoking, coronary artery disease.  Comparison Study: Previous 09/03/18 Performing Technologist:  McKayla Maag RVT, [place]  Examination Guidelines: A complete evaluation includes at minimum, Doppler waveform signals and systolic blood pressure reading at the level of bilateral brachial, anterior tibial, and posterior tibial arteries, when vessel segments are accessible. Bilateral testing is considered an integral part of a complete examination. Photoelectric Plethysmograph (PPG) waveforms and toe systolic pressure readings are included as required and additional duplex testing as needed. Limited examinations for reoccurring indications may be performed as noted.  ABI Findings: +---------+------------------+-----+---------+----------------+ Right    Rt Pressure (mmHg)IndexWaveform Comment          +---------+------------------+-----+---------+----------------+ Brachial 118                    triphasic                 +---------+------------------+-----+---------+----------------+ PTA      164               1.39 triphasic                 +---------+------------------+-----+---------+----------------+ DP       255               2.16 triphasicNon-compressible +---------+------------------+-----+---------+----------------+ Great Toe34                0.29 Abnormal                  +---------+------------------+-----+---------+----------------+ +---------+------------------+-----+---------+---------------------------------+ Left     Lt Pressure (mmHg)IndexWaveform Comment                           +---------+------------------+-----+---------+---------------------------------+ Brachial                        triphasicNot pressure obtained due to IV                                            placement                         +---------+------------------+-----+---------+---------------------------------+ PTA      156               1.32 triphasic                                  +---------+------------------+-----+---------+---------------------------------+ DP        118               1.00 triphasic                                  +---------+------------------+-----+---------+---------------------------------+ Great Toe52                0.44 Abnormal                                   +---------+------------------+-----+---------+---------------------------------+ +-------+----------------+-----------+------------+----------------+  ABI/TBIToday's ABI     Today's TBIPrevious ABIPrevious TBI     +-------+----------------+-----------+------------+----------------+ Right  Non-compressible0.29       0.91        Non-compressible +-------+----------------+-----------+------------+----------------+ Left   1.32            0.44       1.01        Non-compressible +-------+----------------+-----------+------------+----------------+  Summary: Right: Resting right ankle-brachial index indicates noncompressible right lower extremity arteries. The right toe-brachial index is abnormal. Left: Resting left ankle-brachial index is within normal range. The left toe-brachial index is abnormal. *See table(s) above for measurements and observations.  Electronically signed by Gerarda Fraction on 06/11/2023 at 7:45:32 PM.    Final     Assessment/Plan: Diagnosis: Closed left hip fracture Does the need for close, 24 hr/day medical supervision in concert with the patient's rehab needs make it unreasonable for this patient to be served in a less intensive setting? 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 Yes Does the patient require coordinated care of a physician, rehab nurse, therapy disciplines of PT, OT to address physical and functional deficits in the context of the above medical diagnosis(es)? Yes Addressing deficits in the following areas: balance, endurance, locomotion, strength, transferring, bowel/bladder control, bathing, dressing, feeding, grooming, toileting, and psychosocial support Can the patient actively participate in an intensive therapy program of at least 3 hrs of therapy per day at least 5 days per week? Yes The potential for patient to make measurable gains while on inpatient rehab is excellent Anticipated functional outcomes upon discharge from inpatient rehab are supervision with PT and OT Estimated rehab length of stay to reach the above functional goals is: 10-14 days Anticipated discharge destination: Home Overall Rehab/Functional Prognosis: excellent  POST ACUTE RECOMMENDATIONS: This patient's condition is appropriate for continued rehabilitative care in the following setting: CIR Patient has agreed to participate in recommended program. Yes Note that insurance prior authorization may be required for reimbursement for recommended care.    I have personally performed a face to face diagnostic evaluation of this patient. Additionally, I have examined the patient's medical record including any pertinent labs and radiographic images. If the physician assistant has documented in this note, I have reviewed and edited or otherwise concur with the physician assistant's documentation.  Thanks,  Horton Chin, MD 06/13/2023

## 2023-06-14 DIAGNOSIS — S72002A Fracture of unspecified part of neck of left femur, initial encounter for closed fracture: Secondary | ICD-10-CM | POA: Diagnosis not present

## 2023-06-14 LAB — GLUCOSE, CAPILLARY
Glucose-Capillary: 245 mg/dL — ABNORMAL HIGH (ref 70–99)
Glucose-Capillary: 296 mg/dL — ABNORMAL HIGH (ref 70–99)
Glucose-Capillary: 352 mg/dL — ABNORMAL HIGH (ref 70–99)
Glucose-Capillary: 256 mg/dL — ABNORMAL HIGH (ref 70–99)
Glucose-Capillary: 345 mg/dL — ABNORMAL HIGH (ref 70–99)

## 2023-06-14 MED ORDER — INSULIN ASPART 100 UNIT/ML IJ SOLN
0.0000 [IU] | Freq: Every day | INTRAMUSCULAR | Status: DC
  Administered 2023-06-14: 4 [IU] via SUBCUTANEOUS

## 2023-06-14 MED ORDER — INSULIN GLARGINE-YFGN 100 UNIT/ML ~~LOC~~ SOLN
40.0000 [IU] | Freq: Every day | SUBCUTANEOUS | Status: DC
  Administered 2023-06-14: 40 [IU] via SUBCUTANEOUS
  Filled 2023-06-14 (×2): qty 0.4

## 2023-06-14 MED ORDER — INSULIN ASPART 100 UNIT/ML IJ SOLN
12.0000 [IU] | Freq: Three times a day (TID) | INTRAMUSCULAR | Status: DC
  Administered 2023-06-14 – 2023-06-16 (×6): 12 [IU] via SUBCUTANEOUS

## 2023-06-14 NOTE — PMR Pre-admission (Signed)
PMR Admission Coordinator Pre-Admission Assessment  Patient: Dylan Bruce is an 77 y.o., male MRN: [id number] DOB: Jul 19, 1946 Height: 6\' 2"  (188 cm) Weight: 90.2 kg             Insurance Information HMO:     PPO: yes     PCP:      IPA:      80/20:      OTHER:  PRIMARY: UMR/[employer] Choice Plus      Policy#: [id number]      Subscriber: pt CM Name: Layla Maw      Phone#: [phone]   Fax#: [phone] Pre-Cert#: [id number]  approved for 7 days 8/10 until 8/17 f/u with Kathlen Brunswick phone [phone] fax [phone]    Employer: wife; [employer] Benefits:  Phone #: [phone]     Name: 8/8 Eff. Date: 11/06/22     Deduct: $150      Out of Pocket Max: $3000      Life Max: none  CIR: 80%      SNF: 80% Outpatient: 80%     Co-Pay: 20% Home Health: 80%      Co-Pay: 20% DME: 80%     Co-Pay: 20% Providers: in network  SECONDARY: Medicare a and b    Policy#: [id number]      passport one source active a 01/04/2013 and b 11/06/2013 checked 8/8  Financial Counselor:       Phone#:   The "Data Collection Information Summary" for patients in Inpatient Rehabilitation Facilities with attached "Privacy Act Statement-Health Care Records" was provided and verbally reviewed with: Patient and Family  Emergency Contact Information Contact Information     Name Relation Home Work Mobile   [place] Spouse [phone]  [phone]      Other Contacts     Name Relation Home Work Mobile   [place] Daughter   [phone]   Jontay, Corn[place]   [phone]      Current Medical History  Patient Admitting Diagnosis: Hip fracture  History of Present Illness: 77 yo male with history of TAVR, DM,stage IIIa CKD HTN and spina bifida. Presented on 06/08/23 to [facility] ED after a fall following his first Outpatient therapy session. Recent history of frequent falls. Sent home and returned to [facility] ED 8/3.  CT head no acute issues, xray showed left rib fracture. CT of hip confirmed  intertrochanteric left hip fracture. Orthopedic accepted for transfer on 8/3 to [facility] for surgical intervention. Underwent IM nailing by Dr [name] on 8/4.   Eliquis for VTE prophylaxis. Pain control, augmented dosing based on opoid tolerance/dependence prior to admit. Continue home Lyrica. WBAT.   Has RLE leg wound in presence of venous insufficiency. Initially given antibiotics, now discontinued. Need podiatry follow up. Has been in 4:1 atrial flutter. Resumed Eliquis. Discussed with patient and son. Will continue DOAC for now. No rate control agents on PTA med list, to monitor HR. Some atypical chest pain on 8/6. ECG without ST elevations and Troponin reassuring 23>22. Felt to be postoperative flatulence and improved with Maalox. Continue home Imdur and prn Maalox. Stage IIIA CKD . Stopped empagliflozin. CR stable. Continue torsemide and monitor. Continue glargine, and Novolog and resistant SSI. Hgb A1c 8.5. Tamsulosin for BPH. Statin and Zetia for HLD. Some concern for sleep disorder, known OSA. Began trial of Modafinil. Needs referral to Neurology . Restrict driving. Continue CPAP at HS.   Patient's medical record from [facility] has been reviewed by the rehabilitation admission coordinator and physician.  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 Clifton James and Dr. Delia Chimes   SLEEP APNEA    SPINA BIFIDA    Has the patient had major surgery during 100 days prior to admission? Yes  Family History  family history includes Prostate cancer in his father; Valvular heart disease in his father.  Current Medications   Current Facility-Administered Medications:    albuterol  (PROVENTIL) (2.5 MG/3ML) 0.083% nebulizer solution 2.5 mg, 2.5 mg, Nebulization, Q3H PRN, [name], MD   alum & mag hydroxide-simeth (MAALOX/MYLANTA) 200-200-20 MG/5ML suspension 30 mL, 30 mL, Oral, Q4H PRN, Janine Ores K, PA-C, 30 mL at 06/13/23 1732   apixaban (ELIQUIS) tablet 5 mg, 5 mg, Oral, BID, Hazeline Junker B, MD, 5 mg at 06/15/23 1007   atorvastatin (LIPITOR) tablet 80 mg, 80 mg, Oral, Daily, Hazeline Junker B, MD, 80 mg at 06/15/23 1006   bisacodyl (DULCOLAX) suppository 10 mg, 10 mg, Rectal, Daily PRN, Manson Passey, Blaine K, PA-C   clopidogrel (PLAVIX) tablet 75 mg, 75 mg, Oral, Daily, Hazeline Junker B, MD, 75 mg at 06/15/23 1007   docusate sodium (COLACE) capsule 100 mg, 100 mg, Oral, BID, Brown, Blaine K, PA-C, 100 mg at 06/15/23 1008   ezetimibe (ZETIA) tablet 10 mg, 10 mg, Oral, Daily, Hazeline Junker B, MD, 10 mg at 06/15/23 1007   feeding supplement (GLUCERNA SHAKE) (GLUCERNA SHAKE) liquid 237 mL, 237 mL, Oral, TID BM, Hazeline Junker B, MD, 237 mL at 06/15/23 1350   ferrous sulfate tablet 325 mg, 325 mg, Oral, TID PC, Brown, Blaine K, PA-C, 325 mg at 06/15/23 1255   fluticasone (FLONASE) 50 MCG/ACT nasal spray 1 spray, 1 spray, Each Nare, Daily, [name], MD, 1 spray at 06/15/23 1011   hydrocerin (EUCERIN) cream, , Topical, BID, Luiz Iron, NP, Given at 06/15/23 1008   hydrOXYzine (ATARAX) tablet 25 mg, 25 mg, Oral, Daily PRN, Manson Passey, Blaine K, PA-C, 25 mg at 06/15/23 1205   insulin aspart (novoLOG) injection 0-20 Units, 0-20 Units, Subcutaneous, TID WC, Hazeline Junker B, MD, 4 Units at 06/15/23 1203   insulin aspart (novoLOG) injection 0-5 Units, 0-5 Units, Subcutaneous, QHS, [name], MD, 4 Units at 06/14/23 2116   insulin aspart (novoLOG) injection 12 Units, 12 Units, Subcutaneous, TID WC, [name], MD, 12 Units at 06/15/23 1203   insulin glargine-yfgn (SEMGLEE) injection 25 Units, 25 Units, Subcutaneous, BID, Azucena Fallen, MD, 25 Units at 06/15/23 1345   isosorbide  mononitrate (IMDUR) 24 hr tablet 30 mg, 30 mg, Oral, Daily, Hazeline Junker B, MD, 30 mg at 06/15/23 1005   magnesium citrate solution 1 Bottle, 1 Bottle, Oral, Once PRN, Manson Passey, Blaine K, PA-C   magnesium oxide (MAG-OX) tablet 400 mg, 400 mg, Oral, Daily, Hazeline Junker B, MD, 400 mg at 06/15/23 1005   menthol-cetylpyridinium (CEPACOL) lozenge 3 mg, 1 lozenge, Oral, PRN **OR** phenol (CHLORASEPTIC) mouth spray 1 spray, 1 spray, Mouth/Throat, PRN, Manson Passey, Blaine K, PA-C   mirtazapine (REMERON) tablet 30 mg, 30 mg, Oral, QHS, Hazeline Junker B, MD, 30 mg at 06/14/23 2109   modafinil (PROVIGIL)  tablet 100 mg, 100 mg, Oral, Daily, Raulkar, Drema Pry, MD, 100 mg at 06/15/23 0836   mometasone-formoterol (DULERA) 200-5 MCG/ACT inhaler 2 puff, 2 puff, Inhalation, BID, [name], MD, 2 puff at 06/15/23 0831   multivitamin with minerals tablet 1 tablet, 1 tablet, Oral, Daily, [name], MD, 1 tablet at 06/15/23 1006   nitroGLYCERIN (NITROSTAT) SL tablet 0.4 mg, 0.4 mg, Sublingual, Q5 min PRN, Hazeline Junker B, MD, 0.4 mg at 06/12/23 1717   ondansetron (ZOFRAN) tablet 4 mg, 4 mg, Oral, Q6H PRN **OR** ondansetron (ZOFRAN) injection 4 mg, 4 mg, Intravenous, Q6H PRN, Armida Sans, PA-C   Oral care mouth rinse, 15 mL, Mouth Rinse, PRN, Manson Passey, Blaine K, PA-C   oxyCODONE (Oxy IR/ROXICODONE) immediate release tablet 10-15 mg, 10-15 mg, Oral, Q4H PRN, Manson Passey, Blaine K, PA-C, 15 mg at 06/15/23 1255   oxyCODONE (Oxy IR/ROXICODONE) immediate release tablet 5-10 mg, 5-10 mg, Oral, Q4H PRN, Manson Passey, Blaine K, PA-C, 10 mg at 06/12/23 1137   pantoprazole (PROTONIX) EC tablet 40 mg, 40 mg, Oral, Daily, Hazeline Junker B, MD, 40 mg at 06/15/23 1006   polyethylene glycol (MIRALAX / GLYCOLAX) packet 17 g, 17 g, Oral, Daily PRN, Manson Passey, Blaine K, PA-C, 17 g at 06/15/23 1430   pregabalin (LYRICA) capsule 100 mg, 100 mg, Oral, BID, Hazeline Junker B, MD, 100 mg at 06/15/23 1006   senna (SENOKOT) tablet 8.6 mg, 1 tablet, Oral, BID, Brown, Blaine K,  PA-C, 8.6 mg at 06/15/23 1006   sodium chloride 0.9 % bolus 500 mL, 500 mL, Intravenous, Once PRN, [name], MD   tamsulosin [facility]) capsule 0.4 mg, 0.4 mg, Oral, Daily, Hazeline Junker B, MD, 0.4 mg at 06/15/23 1005   torsemide (DEMADEX) tablet 20 mg, 20 mg, Oral, Daily, Hazeline Junker B, MD, 20 mg at 06/15/23 1005   Vilazodone HCl TABS 40 mg, 40 mg, Oral, Daily, Hazeline Junker B, MD, 40 mg at 06/15/23 1006  Patients Current Diet:  Diet Order             Diet regular Room service appropriate? Yes; Fluid consistency: Thin  Diet effective now                  Precautions / Restrictions Precautions Precautions: Fall Precaution Comments: watch closely; possible narcolepsy Restrictions Weight Bearing Restrictions: No LLE Weight Bearing: Weight bearing as tolerated   Has the patient had 2 or more falls or a fall with injury in the past year?Yes  Prior Activity Level Community (5-7x/wk): Mod I with cane  Prior Functional Level Prior Function Prior Level of Function : Independent/Modified Independent Mobility Comments: Walks without assistive device ADLs Comments: independent  Self Care: Did the patient need help bathing, dressing, using the toilet or eating?  Independent  Indoor Mobility: Did the patient need assistance with walking from room to room (with or without device)? Independent  Stairs: Did the patient need assistance with internal or external stairs (with or without device)? Independent  Functional Cognition: Did the patient need help planning regular tasks such as shopping or remembering to take medications? Independent  Patient Information Are you of Hispanic, Latino/a,or Spanish origin?: A. No, not of Hispanic, Latino/a, or Spanish origin What is your race?: A. White Do you need or want an interpreter to communicate with a doctor or health care staff?: 0. No  Patient's Response To:  Health Literacy and Transportation Is the patient able to respond to health  literacy and transportation needs?: Yes Health  Literacy - How often do you need to have someone help you when you read instructions, pamphlets, or other written material from your doctor or pharmacy?: Never In the past 12 months, has lack of transportation kept you from medical appointments or from getting medications?: No In the past 12 months, has lack of transportation kept you from meetings, work, or from getting things needed for daily living?: No  Journalist, newspaper / Equipment Home Assistive Devices/Equipment: None Home Equipment: Shower seat  Prior Device Use: Indicate devices/aids used by the patient prior to current illness, exacerbation or injury?  cane  Current Functional Level Cognition  Overall Cognitive Status: Within Functional Limits for tasks assessed Orientation Level: Oriented X4 Following Commands: Follows one step commands with increased time General Comments: no family present this session, pt with improved command following and was able to follow most instructions on first attempt, may be close to baseline    Extremity Assessment (includes Sensation/Coordination)  Upper Extremity Assessment: Generalized weakness (limited strength d/t left rib fracture)  Lower Extremity Assessment: Generalized weakness RLE Deficits / Details: wound anterior lower leg dressed LLE Deficits / Details: Painful and uncomfortable with movement; grossly decr AROM and strength, limited by pain post fx and surgical repair LLE Coordination: decreased gross motor    ADLs  Overall ADL's : Needs assistance/impaired Eating/Feeding: Modified independent, Sitting Grooming: Oral care, Set up, Sitting Upper Body Bathing: Set up, Sitting Lower Body Bathing: Maximal assistance, Sit to/from stand Upper Body Dressing : Minimal assistance, Sitting Upper Body Dressing Details (indicate cue type and reason): required assist to pull shirt down in the back and front while seated in recliner. Lower  Body Dressing: Total assistance Lower Body Dressing Details (indicate cue type and reason): sitting in recliner. sock management General ADL Comments: OT providing assist with helping patient return to bed and eat dinner. Focused session on progressing functional mobility    Mobility  Overal bed mobility: Needs Assistance Bed Mobility: Sit to Supine Supine to sit: Min assist Sit to supine: Mod assist Sit to sidelying: Mod assist General bed mobility comments: Mod A to help with LLE movement to bed, cues to use bed rails to assist with mobility    Transfers  Overall transfer level: Needs assistance Equipment used: Rolling walker (2 wheels) Transfers: Sit to/from Stand Sit to Stand: Mod assist Bed to/from chair/wheelchair/BSC transfer type:: Step pivot Step pivot transfers: Mod assist General transfer comment: Mod A with cues for anterior leaning, hand placeement, and bringing heels back, pt needing constant cueing.    Ambulation / Gait / Stairs / Wheelchair Mobility  Ambulation/Gait Ambulation/Gait assistance: Editor, commissioning (Feet): 6 Feet Assistive device: Rolling walker (2 wheels) Gait Pattern/deviations: Step-to pattern, Antalgic, Decreased step length - right, Decreased stance time - left, Decreased stride length, Decreased weight shift to left General Gait Details: minA to RW and minA to patient. small step-to pattern with limited wt on LLE Pre-gait activities: standing wt shift and standing marches 2 x 10    Posture / Balance Dynamic Sitting Balance Sitting balance - Comments: Pt using bed rails to maintain sitting balance, cues to tighten core and focus on anterior lean Balance Overall balance assessment: Needs assistance Sitting-balance support: Feet supported, Bilateral upper extremity supported Sitting balance-Leahy Scale: Poor Sitting balance - Comments: Pt using bed rails to maintain sitting balance, cues to tighten core and focus on anterior lean Postural  control: Right lateral lean Standing balance support: Bilateral upper extremity supported, During functional activity, Reliant on  assistive device for balance Standing balance-Leahy Scale: Poor Standing balance comment: reliant on RW    Special needs/care consideration Hgb A1c 8.5 Fall precautions CPAP at [facility]     McNichol, Bonney Aid, RN  Registered Nurse WOC   Consult Note    Signed   Date of Service: 06/10/2023  7:26 PM   Signed      Show:Clear all [x] Written[x] Templated[] Copied  Added by: [x] McNichol, Bonney Aid, RN  [] Hover for details WOC Nurse Consult Note: Reason for Consult:Right LE pretibial full thickness wound in the presence of venous insufficiency, pre ulcerative callus to right foot Wound type:venous insufficiency, neuropathic Pressure Injury POA: N/A Measurement:Right LE wound to be measured by bedside RN with application of next dressing change today Wound bed:See also photo documentation uploaded to EMR by Provider, red, moist Drainage (amount, consistency, odor) light yellow exudate Periwound:hemosiderin staining Dressing procedure/placement/frequency: I have provided Nursing with guidance for the care of the right pretibial ulcer using a daily cleanse and dressing with antimicrobial nonadherent gauze (xeroform).    Recommendations: The pre callus area on the right foot can be managed with a post acute referral to Podiatric Medicine. 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   Turning and repositioning to prevent sacral PI is an important element of the POC as is floatation of the heels to prevent PI to those areas.   WOC nursing team will not follow, but will remain available to this patient, the nursing and medical teams.  Please re-consult if needed.   Thank you for inviting [place] to participate in this patient's Plan of Care.   Ladona Mow, MSN, RN, CNS, GNP, CWOCN, CWON-AP, [facility],  [employer] phone:  [phone]                 possible narcolepsy. Began med trial on 8/8    Previous Home Environment  Living Arrangements: Spouse/significant other  Lives With: Spouse Available Help at Discharge: Family, Available 24 hours/day (wife, son and dtr) Type of Home: House Home Layout: One level Home Access: Ramped entrance Entrance Stairs-Rails: None Entrance Stairs-Number of Steps: ramp entrance Bathroom Shower/Tub: Associate Professor: Yes Home Care Services: Other (Comment) (was at his first OP therapy visit)  Discharge Living Setting Plans for Discharge Living Setting: Patient's home, Lives with (comment) (wife) Type of Home at Discharge: House Discharge Home Layout: One level Discharge Home Access: Ramped entrance Discharge Bathroom Shower/Tub: Tub/shower unit Discharge Bathroom Toilet: Standard Discharge Bathroom Accessibility: Yes How Accessible: Accessible via walker Does the patient have any problems obtaining your medications?: No  Social/Family/Support Systems Patient Roles: Spouse Contact Information: wife, Erskine Squibb Anticipated Caregiver: wife, son, dtr and possible hired Field seismologist Information: see contacts Ability/Limitations of Caregiver: wife with recent wrist fx, works for [employer] Availability: 24/7 Discharge Plan Discussed with Primary Caregiver: Yes Is Caregiver In Agreement with Plan?: Yes Does Caregiver/Family have Issues with Lodging/Transportation while Pt is in Rehab?: No  Goals Patient/Family Goal for Rehab: supervision with PT and OT Expected length of stay: ELOS 10 to 14 days Additional Information: wife with recent wrist fx. to return to work soon; family to provide 24/7 Pt/Family Agrees to Admission and willing to participate: Yes Program Orientation Provided & Reviewed with Pt/Caregiver Including Roles  & Responsibilities:  Yes  Decrease burden of Care through IP rehab admission: n/a  Possible need for SNF placement upon discharge:not anticipated  Patient Condition: This patient's medical and functional status has changed since the consult dated: 06/13/23 in which the Rehabilitation Physician determined and documented that the patient's condition is appropriate for intensive rehabilitative care in an inpatient rehabilitation facility. See "History of Present Illness" (above) for medical update. Functional changes are: overall mod assist. Patient's medical and functional status update has been discussed with the Rehabilitation physician and patient remains appropriate for inpatient rehabilitation. Will admit to inpatient rehab 06/16/23 when bed is available.  Preadmission Screen Completed By:  Clois Dupes, RN MSN 06/15/2023 3:29 PM ______________________________________________________________________   Discussed status with Dr. Berline Chough on 06/15/23 at 1500 and received approval for admission 06/16/23 when bed is available.  Admission Coordinator:  Clois Dupes, RN MSN time 1914 Date 06/15/23

## 2023-06-14 NOTE — Progress Notes (Signed)
Occupational Therapy Treatment Patient Details Name: Dylan Bruce MRN: [id number] DOB: 07/03/1946 Today's Date: 06/14/2023   History of present illness The pt is a 77 yo male presenting 8/3 from [facility] for surgical fixation of L hip fx sustained in a fall. Pt also with L rib fx on imaging. Pt s/p IM nail of L femur 8/4. PMH includes: aortic stenosis, asthma, CAD, CHF, DM II, HLD, HTN, TAVR in 2024, sleep apnea, and spina bifida.   OT comments  Pt needs Mod A for STS but needs consistent cueing for proper body mechanics to facilitate transfer, he exhibits resistance when providing facilitation of forward lean. Pt performed straight leg raises as a premobility warmup, was able to complete a step pivot with Mod A despite wanting to fatigue he persevered with encouragement from OT. Reinforced technique and use of gait belt to assist with lifting LLE as patient noted to have challenge with bed mobility. OT to continue to progress pt as able, DC plans remain appropriate for AIR.      If plan is discharge home, recommend the following:  A lot of help with walking and/or transfers;A lot of help with bathing/dressing/bathroom;Assistance with cooking/housework;Assist for transportation;Help with stairs or ramp for entrance   Equipment Recommendations  Other (comment) (defer)    Recommendations for Other Services      Precautions / Restrictions Precautions Precautions: Fall Precaution Comments: watch closely; possible narcolepsy Restrictions Weight Bearing Restrictions: No LLE Weight Bearing: Weight bearing as tolerated       Mobility Bed Mobility Overal bed mobility: Needs Assistance Bed Mobility: Sit to Supine       Sit to supine: Mod assist   General bed mobility comments: Mod A to help with LLE movement to bed, cues to use bed rails to assist with mobility    Transfers Overall transfer level: Needs assistance Equipment used: Rolling walker (2 wheels) Transfers: Sit to/from  Stand Sit to Stand: Mod assist     Step pivot transfers: Mod assist     General transfer comment: Mod A with cues for anterior leaning, hand placeement, and bringing heels back, pt needing constant cueing.     Balance Overall balance assessment: Needs assistance Sitting-balance support: Feet supported, Bilateral upper extremity supported Sitting balance-Leahy Scale: Poor Sitting balance - Comments: Pt using bed rails to maintain sitting balance, cues to tighten core and focus on anterior lean   Standing balance support: Bilateral upper extremity supported, During functional activity, Reliant on assistive device for balance Standing balance-Leahy Scale: Poor Standing balance comment: reliant on RW                           ADL either performed or assessed with clinical judgement   ADL Overall ADL's : Needs assistance/impaired                                       General ADL Comments: OT providing assist with helping patient return to bed and eat dinner. Focused session on progressing functional mobility    Extremity/Trunk Assessment              Vision       Perception     Praxis      Cognition Arousal: Alert Behavior During Therapy: WFL for tasks assessed/performed Overall Cognitive Status: Within Functional Limits for tasks assessed  Exercises      Shoulder Instructions       General Comments      Pertinent Vitals/ Pain       Pain Assessment Pain Assessment: Faces Faces Pain Scale: Hurts little more Pain Location: L hip Pain Descriptors / Indicators: Aching, Sore Pain Intervention(s): Monitored during session, Limited activity within patient's tolerance, Repositioned  Home Living                                          Prior Functioning/Environment              Frequency           Progress Toward Goals  OT Goals(current goals can  now be found in the care plan section)  Progress towards OT goals: Progressing toward goals  Acute Rehab OT Goals OT Goal Formulation: With patient Time For Goal Achievement: 06/26/23 Potential to Achieve Goals: Good  Plan      Co-evaluation                 AM-PAC OT "6 Clicks" Daily Activity     Outcome Measure   Help from another person eating meals?: None Help from another person taking care of personal grooming?: A Little Help from another person toileting, which includes using toliet, bedpan, or urinal?: A Lot Help from another person bathing (including washing, rinsing, drying)?: Total Help from another person to put on and taking off regular upper body clothing?: A Little Help from another person to put on and taking off regular lower body clothing?: Total 6 Click Score: 14    End of Session Equipment Utilized During Treatment: Gait belt;Rolling walker (2 wheels)  OT Visit Diagnosis: Unsteadiness on feet (R26.81);Muscle weakness (generalized) (M62.81);Pain Pain - Right/Left: Left Pain - part of body: Hip (ribs)   Activity Tolerance Patient tolerated treatment well   Patient Left in bed;with call bell/phone within reach;with bed alarm set   Nurse Communication Mobility status        Time: 1610-9604 OT Time Calculation (min): 28 min  Charges: OT General Charges $OT Visit: 1 Visit OT Treatments $Therapeutic Activity: 23-37 mins  06/14/2023  AB, OTR/L  Acute Rehabilitation Services  Office: [phone]   Tristan Schroeder 06/14/2023, 6:26 PM

## 2023-06-14 NOTE — Inpatient Diabetes Management (Signed)
Inpatient Diabetes Program Recommendations  AACE/ADA: New Consensus Statement on Inpatient Glycemic Control (2015)  Target Ranges:  Prepandial:   less than 140 mg/dL      Peak postprandial:   less than 180 mg/dL (1-2 hours)      Critically ill patients:  140 - 180 mg/dL   Lab Results  Component Value Date   GLUCAP 352 (H) 06/14/2023   HGBA1C 8.5 (H) 02/20/2023    Review of Glycemic Control  Latest Reference Range & Units 06/13/23 07:51 06/13/23 12:17 06/13/23 16:26 06/13/23 20:30 06/14/23 08:01 06/14/23 12:45 06/14/23 13:12  Glucose-Capillary 70 - 99 mg/dL 409 (H) 811 (H) 914 (H) 225 (H) 245 (H) 296 (H) 352 (H)  (H): Data is abnormally high  Diabetes history: DM2  Outpatient Diabetes medications:  Lantus 22 units at bedtime Humalog 18-24 units TID Jardiance 10 QAM Metformin 1000 mg BID Ozempic 4 mg weekly  Current orders for Inpatient glycemic control:  Semglee 35 units at bedtime Novolog 0-20 TID  Novolog 10 units TID with meals   Inpatient Diabetes Program Recommendations:    Semglee 40 units at bedtime Novolog 0-5 units at bedtime  Will continue to follow while inpatient.  Thank you, Dulce Sellar, MSN, CDCES Diabetes Coordinator Inpatient Diabetes Program [phone] (team pager from 8a-5p)

## 2023-06-14 NOTE — Progress Notes (Signed)
Inpatient Rehabilitation Admissions Coordinator   I await [facility] approval for possible CIR admit.  Ottie Glazier, RN, MSN Rehab Admissions Coordinator [phone] 06/14/2023 12:57 PM

## 2023-06-14 NOTE — Progress Notes (Signed)
Physical Therapy Treatment Patient Details Name: Dylan [facility] MRN: [id number] DOB: [birth year] Today's Date: 06/14/2023   History of Present Illness The pt is a 77 yo male presenting 8/3 from [facility] for surgical fixation of L hip fx sustained in a fall. Pt also with L rib fx on imaging. Pt s/p IM nail of L femur 8/4. PMH includes: aortic stenosis, asthma, CAD, CHF, DM II, HLD, HTN, TAVR in 2024, sleep apnea, and spina bifida.    PT Comments  The pt was agreeable to session, benefits from increased exercises to improve activation and ROM prior to attempts at standing and OOB mobility. The pt was then able to complete multiple sit-stand transfers with minA and use of RW, and tolerated standing wt shifts and attempts at standing marching to prepare for gait training. Pt with poor tolerance for wt shift onto his LLE due to pain, resulting in antalgic gait and limited distance. He is highly motivated to progress back to independence and improve strength and mobility of LLE, continue to recommend intensive therapies to achieve these goals.    If plan is discharge home, recommend the following: A lot of help with walking and/or transfers;A lot of help with bathing/dressing/bathroom;Assistance with cooking/housework;Assist for transportation   Can travel by private vehicle        Equipment Recommendations  Rolling walker (2 wheels);BSC/3in1;Other (comment)    Recommendations for Other Services       Precautions / Restrictions Precautions Precautions: Fall Precaution Comments: watch closely; possible narcolepsy Restrictions Weight Bearing Restrictions: No LLE Weight Bearing: Weight bearing as tolerated     Mobility  Bed Mobility Overal bed mobility: Needs Assistance Bed Mobility: Supine to Sit     Supine to sit: Min assist     General bed mobility comments: minA to complete movement with LLE, pt able to scoot using bed rail    Transfers Overall transfer level: Needs  assistance Equipment used: Rolling walker (2 wheels) Transfers: Sit to/from Stand Sit to Stand: Mod assist           General transfer comment: modA to rise to standing from EOB x3 in session. cues for hand placement    Ambulation/Gait Ambulation/Gait assistance: Min assist Gait Distance (Feet): 6 Feet Assistive device: Rolling walker (2 wheels) Gait Pattern/deviations: Step-to pattern, Antalgic, Decreased step length - right, Decreased stance time - left, Decreased stride length, Decreased weight shift to left     Pre-gait activities: standing wt shift and standing marches 2 x 10 General Gait Details: minA to RW and minA to patient. small step-to pattern with limited wt on LLE    Balance Overall balance assessment: Needs assistance Sitting-balance support: No upper extremity supported, Feet supported Sitting balance-Leahy Scale: Poor Sitting balance - Comments: leaning R due to L hip pain, BUE support to maintain Postural control: Right lateral lean Standing balance support: Bilateral upper extremity supported Standing balance-Leahy Scale: Poor Standing balance comment: reliant on RW                            Cognition Arousal: Alert Behavior During Therapy: WFL for tasks assessed/performed Overall Cognitive Status: No family/caregiver present to determine baseline cognitive functioning                         Following Commands: Follows one step commands with increased time       General Comments: no family present this session, pt  with improved command following and was able to follow most instructions on first attempt, may be close to baseline        Exercises Total Joint Exercises Hip ABduction/ADduction: AAROM, 10 reps, Both (isometric) Long Arc Quad: AROM, Both, 10 reps, Seated Knee Flexion: AROM, Both, 10 reps, Seated    General Comments General comments (skin integrity, edema, etc.): VSS on RA      Pertinent Vitals/Pain Pain  Assessment Pain Assessment: Faces Faces Pain Scale: Hurts even more Pain Location: L hip Pain Descriptors / Indicators: Aching, Sore Pain Intervention(s): Limited activity within patient's tolerance, Monitored during session, Repositioned     PT Goals (current goals can now be found in the care plan section) Acute Rehab PT Goals Patient Stated Goal: be independent to be able to get home PT Goal Formulation: With patient Time For Goal Achievement: 06/25/23 Potential to Achieve Goals: Good Progress towards PT goals: Progressing toward goals    Frequency    Min 1X/week      PT Plan Current plan remains appropriate       AM-PAC PT "6 Clicks" Mobility   Outcome Measure  Help needed turning from your back to your side while in a flat bed without using bedrails?: A Little Help needed moving from lying on your back to sitting on the side of a flat bed without using bedrails?: A Lot Help needed moving to and from a bed to a chair (including a wheelchair)?: A Lot Help needed standing up from a chair using your arms (e.g., wheelchair or bedside chair)?: A Lot Help needed to walk in hospital room?: A Lot Help needed climbing 3-5 steps with a railing? : A Lot 6 Click Score: 13    End of Session Equipment Utilized During Treatment: Gait belt Activity Tolerance: Patient tolerated treatment well Patient left: with call bell/phone within reach;in chair;with chair alarm set Nurse Communication: Mobility status PT Visit Diagnosis: Unsteadiness on feet (R26.81);Other abnormalities of gait and mobility (R26.89);Pain Pain - Right/Left: Left Pain - part of body: Hip     Time: 1610-9604 PT Time Calculation (min) (ACUTE ONLY): 37 min  Charges:    $Gait Training: 8-22 mins $Therapeutic Exercise: 8-22 mins PT General Charges $$ ACUTE PT VISIT: 1 Visit                     Vickki Muff, PT, DPT   Acute Rehabilitation Department Office [phone] Secure Chat Communication  Preferred   [facility] 06/14/2023, 11:31 AM

## 2023-06-14 NOTE — Progress Notes (Signed)
[name]  Dylan Bruce (DOB: April 18, 1946) WNU:[id number] PCP: Lindwood Qua, MD  Brief Narrative: Dylan Bruce is a 77 y.o. male with a history of AS s/p TAVR, T2DM, HTN, frequent falls who was transferred from [facility] on 8/3 to [facility] for orthopedic evaluation. He had fallen at a PT session, found to have left rib fracture initially and returned the next day to their ED with hip pain found to have intertrochanteric left hip fracture for which he requested admission to [facility]. He had IM nail placement 8/4 by Dr. [name].   Subjective: No complaints currently, said it was hard to get staff to help in his room last night. Ready for rehab and eager to try modafinil.   Objective: BP 135/61 (BP Location: Left Arm)   Pulse 67   Temp 99.1 F (37.3 C) (Oral)   Resp 17   Ht 6\' 2"  (1.88 m)   Wt 90.2 kg   SpO2 95%   BMI 25.53 kg/m   Gen: No distress Pulm: Clear, nonlabored  CV: RRR, no MRG or pitting edema GI: Soft, NT, ND, +BS Neuro: Alert and oriented. No new focal deficits. Ext: Warm, no deformities Skin: RLE dressing c/d/I, left thigh surgical sites c/d/I dressings with slight increase in ecchymosis superiorly.   Assessment & Plan: Intertrochanteric left hip fracture: s/p IM nail 8/4 by Dr. [name].  - Eliquis for VTE ppx - Pain control per orthopedics, augmented dosing based on opioid tolerance/dependence PTA. Continue home lyrica.  - WBAT, PT/OT to eval 8/5 > recommending CIR, formal consult placed. Pt's medical issues are stabilized and he is stable for transfer once insurance authorization obtained and bed available.    Right lower leg wound in presence of venous insufficiency:  - No longer appears infected, DC abx.  - Local wound care.   - Tx venous insufficiency w/compression/elevation   Fracture of multiple ribs of left side:  - Continue pain control, incentive spirometry.   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 Normal function and LVEF June 2024.  - Continue plavix, continue home meds including isosorbide and demadex.   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 - Needs podiatry follow up   PAF: Has consistently been in 4:1 atrial flutter.  - Resumed eliquis postoperatively. We have discussed r/b/a with his frequent falls including son in discussion. We will continue DOAC for now.  - No rate control agents on PTA med list, monitor HR.    Atypical chest discomfort: Developed 8/6. Troponins reassuring 23 > 22, ECG without ST elevations, not provoked by exertion. Pt has had postoperative flatulence and these symptoms improved with maalox, so suspect GI etiology.  - Continue monitoring, continue home imdur, continue prn maalox.   Stage IIIa CKD: Based on current CrCl.  - Monitor BMP postoperatively, stopped empagliflozin. CrCl stable, will continue torsemide.   HTN:  - Continue imdur.    T2DM: HbA1c 8.5%.  - Continue glargine, augment to 35u qHS, continue augmented mealtime novolog (10u TIDWC) and resistant SSI. Likely to need further titration, but will see trends today to decide.  BPH:  - Continue tamsulosin  HLD:  - Continue statin, zetia  Concern for sleep disorder, known OSA: Pt seems to fall asleep abruptly inappropriately and return to full wakefulness abruptly once disturbed. This has been more chronic and worsening, likely exaggerated in  the perioperative period. He denies daytime sleepiness. - Start trial of modafinil, d/w PM&R - Needs referral to neurology. D/w pt and son. - Restrictions on driving, etc. discussed at length and will be reinforced.  - Continue CPAP qHS and would likely benefit from titration to r/o undertreatment  COPD: Quiescent.  - Continue scheduled BD, prn albuterol  [name], MD [facility] [email or web address] 06/14/2023, 9:50 AM

## 2023-06-15 DIAGNOSIS — S72002A Fracture of unspecified part of neck of left femur, initial encounter for closed fracture: Secondary | ICD-10-CM | POA: Diagnosis not present

## 2023-06-15 LAB — GLUCOSE, CAPILLARY
Glucose-Capillary: 278 mg/dL — ABNORMAL HIGH (ref 70–99)
Glucose-Capillary: 233 mg/dL — ABNORMAL HIGH (ref 70–99)
Glucose-Capillary: 177 mg/dL — ABNORMAL HIGH (ref 70–99)
Glucose-Capillary: 242 mg/dL — ABNORMAL HIGH (ref 70–99)
Glucose-Capillary: 166 mg/dL — ABNORMAL HIGH (ref 70–99)

## 2023-06-15 MED ORDER — INSULIN GLARGINE-YFGN 100 UNIT/ML ~~LOC~~ SOLN
25.0000 [IU] | Freq: Two times a day (BID) | SUBCUTANEOUS | Status: DC
  Administered 2023-06-15 – 2023-06-16 (×3): 25 [IU] via SUBCUTANEOUS
  Filled 2023-06-15 (×4): qty 0.25

## 2023-06-15 NOTE — Inpatient Diabetes Management (Signed)
Inpatient Diabetes Program Recommendations  AACE/ADA: New Consensus Statement on Inpatient Glycemic Control (2015)  Target Ranges:  Prepandial:   less than 140 mg/dL      Peak postprandial:   less than 180 mg/dL (1-2 hours)      Critically ill patients:  140 - 180 mg/dL   Lab Results  Component Value Date   GLUCAP 278 (H) 06/15/2023   HGBA1C 8.5 (H) 02/20/2023    Review of Glycemic Control  Latest Reference Range & Units 06/14/23 08:01 06/14/23 12:45 06/14/23 13:12 06/14/23 16:05 06/14/23 21:16 06/15/23 07:55  Glucose-Capillary 70 - 99 mg/dL 657 (H)  Novolog 17 units 296 (H)  Novolog 21 units 352 (H) 256 (H)  Novolog 23 units 345 (H)  Novolog 4 units 278 (H)  Novolog 23 units   Diabetes history: DM2  Outpatient Diabetes medications:  Lantus 22 units at bedtime Humalog 18-24 units TID Jardiance 10 QAM Metformin 1000 mg BID Ozempic 4 mg weekly  Current orders for Inpatient glycemic control:  Semglee 40 units at bedtime Novolog 0-20 TID  Novolog 12 units TID with meals   Inpatient Diabetes Program Recommendations:    -   Increase Semglee 50 units at bedtime -   Increase Novolog 16 units tid meal coverage if eating >50% of meals   Will continue to follow while inpatient.  Thanks,  Christena Deem RN, MSN, BC-ADM Inpatient Diabetes Coordinator Team Pager [phone] (8a-5p)

## 2023-06-15 NOTE — Progress Notes (Addendum)
[name]  Dylan Bruce (DOB: [birth year]) ZOX:[id number] PCP: Lindwood Qua, MD  Brief Narrative: Dylan Bruce is a 77 y.o. male with a history of AS s/p TAVR, T2DM, HTN, frequent falls who was [facility] from [facility] to [facility] for orthopedic evaluation. He had fallen at a PT session, found to have left rib fracture initially and returned the next day to their ED with hip pain found to have intertrochanteric left hip fracture for which he requested admission to [facility]. He had IM nail placement 8/4 by Dr. [name].   Subjective: No complaints currently, slept well - still has pain with ambulation but improving generally.   Objective: BP (!) 147/81 (BP Location: Right Arm)   Pulse 67   Temp 98.3 F (36.8 C) (Oral)   Resp 17   Ht 6\' 2"  (1.88 m)   Wt 90.2 kg   SpO2 90%   BMI 25.53 kg/m   Gen: No distress Pulm: Clear, nonlabored  CV: RRR, no MRG or pitting edema GI: Soft, NT, ND, +BS Neuro: Alert and oriented. No new focal deficits. Ext: Warm, no deformities Skin: RLE dressing clean/dry/intact  Assessment & Plan:  Intertrochanteric left hip fracture: s/p IM nail 8/4 by Dr. [name].  - Eliquis for VTE ppx - Pain control per orthopedics, augmented dosing based on opioid tolerance/dependence PTA. Continue home lyrica.  - WBAT, PT/OT to eval 8/5 > recommending CIR, formal consult placed. Pt's medical issues are stabilized and he is stable for transfer once insurance authorization obtained and bed available.    Right lower leg wound in presence of venous insufficiency:  - No longer appears infected, DC abx.  - Local wound care.   - Tx venous insufficiency w/compression/elevation   Fracture of multiple ribs of left side, POA: - Continue pain control, incentive spirometry.   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  - Normal function and LVEF June 2024.  - Continue plavix, continue home meds  including isosorbide and demadex.   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 - Needs podiatry follow up   Paroxysmal afib/flutter:  - Stable in 4:1 atrial flutter.  - Continue eliquis postoperatively. We have discussed risks, benefits,and alternatives given his frequent falls; including son in discussion. We will continue DOAC for now but this may need to be addressed in the future if patient's risk of bleeding outweighs the benefit of anticoagulation  - Rate controlled - on no meds prior to admission    Atypical chest/epigastric discomfort:  - Noted 8/6 with peak troponin at 23; EKG without overt ST elevation - Improved with maalox -likely GI in origin  Stage IIIa CKD  - Monitor BMP postoperatively, stopped empagliflozin. CrCl stable, will continue torsemide.   HTN:  - Continue imdur.    T2DM uncontrolled with hyperglycemia: - HbA1c 8.5%.  - Continue glargine, increase to 25u BID (previously 40 at bedtime),  - Continue mealtime novolog (10u TIDWC) and resistant SSI.  - Titrate as appropriate, monitor for hypoglycemia  BPH:  - Continue tamsulosin  HLD:  - Continue statin, zetia  Concern for sleep disorder, known OSA: Pt seems to fall asleep abruptly inappropriately and return to full wakefulness abruptly once disturbed. This has been more chronic and worsening, likely exaggerated in the perioperative period. He denies daytime sleepiness. - Start trial of modafinil, d/w PM&R - Needs referral  to neurology. D/w pt and son. - Restrictions on driving, etc. discussed at length and will be reinforced.  - Continue CPAP qHS and would likely benefit from titration to r/o undertreatment  COPD: Quiescent.  - Continue scheduled BD, prn albuterol  Azucena Fallen, DO [facility] [email or web address] 06/15/2023, 7:53 AM

## 2023-06-15 NOTE — H&P (Signed)
Physical Medicine and Rehabilitation Admission H&P   CC: Functional deficits secondary to left femur fracture  HPI: Dylan Bruce is a 77 year old male who fell and initial x-rays at [facility] negative for fracture. Returned to ED on 8/03 and CT revealed intertrochanteric left hip fracture. Also, imaging significant for left rib fracture. Transferred to [facility] for definitive care. Underwent left IM nail by Dr [name] on 8/04. History of PAF: has consistently been in 4:1 atrial flutter. Normal LVEF. WBAT. Tolerating diet. WOC RN consulted for sacral pressure injury and venous stasis ulcer. Has chronic pain (see below) and is upset the last day or two with how his pain is being managed and the length of time it takes to have his pain medication brought to him. Reports decreased strength and mobility in right shoulder.  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 S/p left shoulder arthroplasty. He has history of chronic back pain and takes approximately 4 Percocet 10/325 daily (Dr. Lindwood Qua), Lyrica 100 mg BID, tizanidine 4 mg at bedtime and cyclobenzaprine 10 mg at bedtime.  The patient requires inpatient physical medicine and rehabilitation evaluations and treatment secondary to dysfunction due to left femur fracture.  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 Clifton James and Dr. Delia Chimes   SLEEP APNEA    SPINA BIFIDA    Past Surgical History:  Procedure Laterality Date   BACK SURGERY     CARPAL TUNNEL RELEASE Left 08/21/2019   Procedure: CARPAL TUNNEL RELEASE;  Surgeon:  Cindee Salt, MD;  Location: [facility];  Service: Orthopedics;  Laterality: Left;  AXILLARY BLOCK   CORONARY ARTERY BYPASS GRAFT     INTRAMEDULLARY (IM) NAIL INTERTROCHANTERIC Left 06/10/2023   Procedure: INTRAMEDULLARY (IM) NAIL INTERTROCHANTERIC;  Surgeon: Teryl Lucy, MD;  Location: [facility] OR;  Service: Orthopedics;  Laterality: Left;   INTRAOPERATIVE TRANSTHORACIC ECHOCARDIOGRAM N/A 02/21/2023   Procedure: INTRAOPERATIVE TRANSTHORACIC ECHOCARDIOGRAM;  Surgeon: Kathleene Hazel, MD;  Location: [facility];  Service: Open Heart Surgery;  Laterality: N/A;   JOINT REPLACEMENT Left    shoulder   LEFT HEART CATH AND CORS/GRAFTS ANGIOGRAPHY N/A 06/18/2018   Procedure: LEFT HEART CATH AND CORS/GRAFTS ANGIOGRAPHY;  Surgeon: Runell Gess, MD;  Location: [facility];  Service: Cardiovascular;  Laterality: N/A;   RIGHT/LEFT HEART CATH AND CORONARY/GRAFT ANGIOGRAPHY N/A 01/11/2023   Procedure: RIGHT/LEFT HEART CATH AND CORONARY/GRAFT ANGIOGRAPHY;  Surgeon: Kathleene Hazel, MD;  Location: [facility];  Service: Cardiovascular;  Laterality: N/A;   TRANSCATHETER AORTIC VALVE REPLACEMENT, TRANSFEMORAL Left 02/21/2023   Procedure: Transcatheter Aortic Valve Replacement, Transfemoral;  Surgeon: Kathleene Hazel, MD;  Location: [facility];  Service: Open Heart Surgery;  Laterality: Left;   TRIGGER FINGER RELEASE Left 08/21/2019   Procedure: RELEASE TRIGGER LEFT SMALL FINGER LEFT INDEX;  Surgeon: Cindee Salt, MD;  Location: [facility];  Service: Orthopedics;  Laterality: Left;   ULNAR NERVE TRANSPOSITION Left 08/21/2019   Procedure: DECOMPRESSION WITH ULNAR  NERVE LEFT CUBITAL TUNNEL ULNAR;  Surgeon: Cindee Salt, MD;  Location: [facility];  Service: Orthopedics;  Laterality: Left;   Family History  Problem Relation Age of Onset   Valvular heart disease Father    Prostate cancer Father    Social History:  reports that  he has never smoked. He has never used smokeless tobacco. He reports that he does not currently use alcohol. He reports that he does not use drugs. Allergies:  Allergies  Allergen Reactions   Cefuroxime Diarrhea and Nausea And Vomiting    Severe. Can take Ancef 2g IV on 02/21/2023 without reporting any issue   Ibuprofen Anaphylaxis and Shortness Of Breath        Iodinated Contrast Media Shortness Of Breath and Other (See Comments)    Pt developed burning of face and SOB after 02/06/2023 CT scan    Cefpodoxime Rash   Ciprofloxacin Rash   Doxycycline Rash   Sulfamethoxazole-Trimethoprim Rash   Fluocinolone Other (See Comments)    Unknown reaction   Probenecid Other (See Comments)    Unknown reaction   Allopurinol Rash   Azithromycin Rash   Sulfa Antibiotics Rash   Medications Prior to Admission  Medication Sig Dispense Refill   albuterol (PROVENTIL HFA;VENTOLIN HFA) 108 (90 Base) MCG/ACT inhaler Inhale 2 puffs into the lungs every 4 (four) hours as needed for wheezing.     [EXPIRED] amoxicillin-clavulanate (AUGMENTIN) 875-125 MG tablet Take 1 tablet by mouth 2 (two) times daily.     apixaban (ELIQUIS) 5 MG TABS tablet Take 1 tablet (5 mg total) by mouth 2 (two) times daily. 120 tablet 2   atorvastatin (LIPITOR) 80 MG tablet Take 1 tablet (80 mg total) by mouth daily. 30 tablet 0   budesonide-formoterol (SYMBICORT) 160-4.5 MCG/ACT inhaler Inhale 2 puffs into the lungs in the morning and at bedtime.     clobetasol cream (TEMOVATE) 0.05 % Apply 1 application topically daily as needed (rash).      clopidogrel (PLAVIX) 75 MG tablet Take 1 tablet (75 mg total) by mouth daily. 90 tablet 3   COSENTYX SENSOREADY, 300 MG, 150 MG/ML SOAJ Inject 300 mg into the skin every 30 (thirty) days.     cyclobenzaprine (FLEXERIL) 10 MG tablet Take 10 mg by mouth at bedtime.      desonide (DESOWEN) 0.05 % cream Apply 1 application topically daily as needed (rash).      docusate sodium (COLACE) 100 MG capsule  Take 100 mg by mouth daily.      DOXYCYCLINE CALCIUM PO Take by mouth in the morning and at bedtime.     esomeprazole (NEXIUM) 40 MG capsule Take 40 mg by mouth daily before breakfast.     ezetimibe (ZETIA) 10 MG tablet Take 10 mg by mouth daily.     fenofibrate (TRICOR) 145 MG tablet Take 145 mg by mouth daily.     fluticasone (FLONASE) 50 MCG/ACT nasal spray Place 1 spray into both nostrils in the morning and at bedtime.     hydrocortisone (ANUCORT-HC) 25 MG suppository Place 1 suppository rectally 2 (two) times daily as needed for hemorrhoids.     hydrOXYzine (VISTARIL) 25 MG capsule Take 25 mg by mouth daily as needed for itching (sleep).     insulin lispro (HUMALOG KWIKPEN) 100 UNIT/ML KiwkPen Inject 18-24 Units into the skin 3 (three) times daily with meals.     isosorbide mononitrate (IMDUR) 30 MG 24 hr tablet Take 1 tablet (30 mg total) by mouth daily. Please  schedule appointment for refills. 30 tablet 0   JARDIANCE 10 MG TABS tablet Take 10 mg by mouth daily.     LANTUS SOLOSTAR 100 UNIT/ML Solostar Pen Inject 22 Units into the skin at bedtime.  5   LYRICA 100 MG capsule Take 1 capsule (100 mg total) by mouth 2 (two) times daily.  5   magnesium oxide (MAG-OX) 400 (240 Mg) MG tablet Take 400 mg by mouth daily.     meclizine (ANTIVERT) 12.5 MG tablet Take 12.5 mg by mouth 3 (three) times daily as needed for dizziness.     metFORMIN (GLUCOPHAGE) 1000 MG tablet Take 1,000 mg by mouth 2 (two) times daily.     mirtazapine (REMERON) 30 MG tablet Take 1 tablet by mouth at bedtime.      nitroGLYCERIN (NITROSTAT) 0.4 MG SL tablet Place 1 tablet (0.4 mg total) under the tongue every 5 (five) minutes as needed for chest pain. (Patient taking differently: Place 0.4 mg under the tongue daily as needed (for difficulty breathing d/t fluid in lungs).) 90 tablet 3   nystatin cream (MYCOSTATIN) Apply 1 Application topically 2 (two) times daily.     oxyCODONE-acetaminophen (PERCOCET) 10-325 MG per tablet  Take 1-2 tablets by mouth 2 (two) times daily as needed for pain.      Semaglutide, 1 MG/DOSE, (OZEMPIC, 1 MG/DOSE,) 4 MG/3ML SOPN Inject 4 mg into the skin once a week.     SOOLANTRA 1 % CREA Apply 1 application  topically daily.  3   tamsulosin (FLOMAX) 0.4 MG CAPS capsule Take 0.4 capsules by mouth daily.     tiZANidine (ZANAFLEX) 4 MG tablet Take 4 mg by mouth at bedtime.     torsemide (DEMADEX) 20 MG tablet Take 20 mg by mouth daily.     triamcinolone cream (KENALOG) 0.1 % Apply 1 Application topically 2 (two) times daily as needed (for itching).     ULORIC 40 MG tablet Take 1 tablet by mouth Daily.     VIIBRYD 40 MG TABS Take 40 mg by mouth Daily.      Torsemide 40 MG TABS Take 40 mg by mouth daily. (Patient not taking: Reported on 06/10/2023) 60 tablet 2      Home: Home Living Family/patient expects to be discharged to:: Private residence Living Arrangements: Spouse/significant other Available Help at Discharge: Family, Available 24 hours/day (wife, son and dtr) Type of Home: House Home Access: Ramped entrance Entrance Stairs-Number of Steps: ramp entrance Entrance Stairs-Rails: None Home Layout: One level Bathroom Shower/Tub: Engineer, manufacturing systems: Standard Bathroom Accessibility: Yes Home Equipment: Information systems manager  Lives With: Spouse   Functional History: Prior Function Prior Level of Function : Independent/Modified Independent Mobility Comments: Walks without assistive device ADLs Comments: independent  Functional Status:  Mobility: Bed Mobility Overal bed mobility: Needs Assistance Bed Mobility: Sit to Supine Supine to sit: Min assist Sit to supine: Mod assist Sit to sidelying: Mod assist General bed mobility comments: Mod A to help with LLE movement to bed, cues to use bed rails to assist with mobility Transfers Overall transfer level: Needs assistance Equipment used: Rolling walker (2 wheels) Transfers: Sit to/from Stand Sit to Stand: Mod assist Bed  to/from chair/wheelchair/BSC transfer type:: Step pivot Step pivot transfers: Mod assist General transfer comment: Mod A with cues for anterior leaning, hand placeement, and bringing heels back, pt needing constant cueing. Ambulation/Gait Ambulation/Gait assistance: Min assist Gait Distance (Feet): 6 Feet Assistive device: Rolling walker (2 wheels) Gait Pattern/deviations: Step-to pattern, Antalgic, Decreased  step length - right, Decreased stance time - left, Decreased stride length, Decreased weight shift to left General Gait Details: minA to RW and minA to patient. small step-to pattern with limited wt on LLE Pre-gait activities: standing wt shift and standing marches 2 x 10    ADL: ADL Overall ADL's : Needs assistance/impaired Eating/Feeding: Modified independent, Sitting Grooming: Oral care, Set up, Sitting Upper Body Bathing: Set up, Sitting Lower Body Bathing: Maximal assistance, Sit to/from stand Upper Body Dressing : Minimal assistance, Sitting Upper Body Dressing Details (indicate cue type and reason): required assist to pull shirt down in the back and front while seated in recliner. Lower Body Dressing: Total assistance Lower Body Dressing Details (indicate cue type and reason): sitting in recliner. sock management General ADL Comments: OT providing assist with helping patient return to bed and eat dinner. Focused session on progressing functional mobility  Cognition: Cognition Overall Cognitive Status: Within Functional Limits for tasks assessed Orientation Level: Oriented X4 Cognition Arousal: Alert Behavior During Therapy: WFL for tasks assessed/performed Overall Cognitive Status: Within Functional Limits for tasks assessed Area of Impairment: Memory, Following commands, Problem solving Memory: Decreased short-term memory Following Commands: Follows one step commands with increased time Problem Solving: Requires verbal cues, Difficulty sequencing, Slow  processing General Comments: no family present this session, pt with improved command following and was able to follow most instructions on first attempt, may be close to baseline  Physical Exam: Blood pressure (!) 148/72, pulse 67, temperature 99.9 F (37.7 C), temperature source Oral, resp. rate 17, height 6\' 2"  (1.88 m), weight 90.2 kg, SpO2 97%. Physical Exam  Results for orders placed or performed during the hospital encounter of 06/09/23 (from the past 48 hour(s))  Glucose, capillary     Status: Abnormal   Collection Time: 06/13/23  4:26 PM  Result Value Ref Range   Glucose-Capillary 219 (H) 70 - 99 mg/dL    Comment: Glucose reference range applies only to samples taken after fasting for at least 8 hours.  Glucose, capillary     Status: Abnormal   Collection Time: 06/13/23  8:30 PM  Result Value Ref Range   Glucose-Capillary 225 (H) 70 - 99 mg/dL    Comment: Glucose reference range applies only to samples taken after fasting for at least 8 hours.  Glucose, capillary     Status: Abnormal   Collection Time: 06/14/23  8:01 AM  Result Value Ref Range   Glucose-Capillary 245 (H) 70 - 99 mg/dL    Comment: Glucose reference range applies only to samples taken after fasting for at least 8 hours.   Comment 1 Notify RN   Glucose, capillary     Status: Abnormal   Collection Time: 06/14/23 12:45 PM  Result Value Ref Range   Glucose-Capillary 296 (H) 70 - 99 mg/dL    Comment: Glucose reference range applies only to samples taken after fasting for at least 8 hours.  Glucose, capillary     Status: Abnormal   Collection Time: 06/14/23  1:12 PM  Result Value Ref Range   Glucose-Capillary 352 (H) 70 - 99 mg/dL    Comment: Glucose reference range applies only to samples taken after fasting for at least 8 hours.  Glucose, capillary     Status: Abnormal   Collection Time: 06/14/23  4:05 PM  Result Value Ref Range   Glucose-Capillary 256 (H) 70 - 99 mg/dL    Comment: Glucose reference range  applies only to samples taken after fasting for at least 8  hours.  Glucose, capillary     Status: Abnormal   Collection Time: 06/14/23  9:16 PM  Result Value Ref Range   Glucose-Capillary 345 (H) 70 - 99 mg/dL    Comment: Glucose reference range applies only to samples taken after fasting for at least 8 hours.  CBC     Status: Abnormal   Collection Time: 06/15/23  1:25 AM  Result Value Ref Range   WBC 11.2 (H) 4.0 - 10.5 K/uL   RBC 3.73 (L) 4.22 - 5.81 MIL/uL   Hemoglobin 9.5 (L) 13.0 - 17.0 g/dL   HCT 16.9 (L) 67.8 - 93.8 %   MCV 81.5 80.0 - 100.0 fL   MCH 25.5 (L) 26.0 - 34.0 pg   MCHC 31.3 30.0 - 36.0 g/dL   RDW 10.1 (H) 75.1 - 02.5 %   Platelets 337 150 - 400 K/uL   nRBC 0.0 0.0 - 0.2 %    Comment: Performed at [facility], [address], [place], [place] [address]  Basic metabolic panel     Status: Abnormal   Collection Time: 06/15/23  1:25 AM  Result Value Ref Range   Sodium 133 (L) 135 - 145 mmol/L   Potassium 4.9 3.5 - 5.1 mmol/L   Chloride 98 98 - 111 mmol/L   CO2 24 22 - 32 mmol/L   Glucose, Bld 340 (H) 70 - 99 mg/dL    Comment: Glucose reference range applies only to samples taken after fasting for at least 8 hours.   BUN 51 (H) 8 - 23 mg/dL   Creatinine, Ser 8.24 (H) 0.61 - 1.24 mg/dL   Calcium 8.6 (L) 8.9 - 10.3 mg/dL   GFR, Estimated 45 (L) >60 mL/min    Comment: (NOTE) Calculated using the CKD-EPI Creatinine Equation (2021)    Anion gap 11 5 - 15    Comment: Performed at [facility], [address] [place], [place] [address]  Glucose, capillary     Status: Abnormal   Collection Time: 06/15/23  7:55 AM  Result Value Ref Range   Glucose-Capillary 278 (H) 70 - 99 mg/dL    Comment: Glucose reference range applies only to samples taken after fasting for at least 8 hours.  Glucose, capillary     Status: Abnormal   Collection Time: 06/15/23 11:19 AM  Result Value Ref Range   Glucose-Capillary 233 (H) 70 - 99 mg/dL    Comment: Glucose reference  range applies only to samples taken after fasting for at least 8 hours.  Glucose, capillary     Status: Abnormal   Collection Time: 06/15/23 12:02 PM  Result Value Ref Range   Glucose-Capillary 177 (H) 70 - 99 mg/dL    Comment: Glucose reference range applies only to samples taken after fasting for at least 8 hours.   No results found.    Blood pressure (!) 148/72, pulse 67, temperature 99.9 F (37.7 C), temperature source Oral, resp. rate 17, height 6\' 2"  (1.88 m), weight 90.2 kg, SpO2 97%.  Medical Problem List and Plan: 1. Functional deficits secondary to ***  -patient may *** shower  -ELOS/Goals: ***  2.  Antithrombotics: -DVT/anticoagulation:  Pharmaceutical: Eliquis  -antiplatelet therapy: Plavix   3. 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 Mood/Behavior/Sleep: LCSW to evaluate and provide emotional support  -antipsychotic agents: Vilazodone 40 mg daily  -  continue Remeron 30 mg q HS  -continue Provigil 100 mg daily   5. Neuropsych/cognition: This patient *** capable of making decisions on *** own behalf.  6. Skin/Wound Care: Routine skin care checks  -sacral pressure injury; continue local care/pressure relief  -monitor surgical incision   7. Fluids/Electrolytes/Nutrition: Routine Is and Os and follow-up chemistries  -continue Mag-ox 400 mg daily  8: Hypertension: monitor TID and prn -Continue Imdur 30 mg daily -continue Demadex 20 mg daily  9: Hyperlipidemia: continue statin, Zetia  10: DM: CBGs QID; A1c = 8.5% on 02/20/2023 (Jardiance 10 mg, Metformin 1000 mg BID, Ozempic, Lantus and SSI with meals at home)  -continue SSI  -continue Novolog 12 units with meals  -continue Semglee 25 units BID  11: Venous stasis: RLE pre-tibial ulcer; continue local wound care  12: S/p left IM nail Dr. [name]  -weight bearing at  tolerated  13: Sleep apnea: CPAP at 17 nightly  14: Left rib fractures: pain control and pulmonary toilet  15: Paroxysmal atrial fib: on Eliquis, Imdur, Demadex (rate controlled no other meds at home)  16: BPH: continue Flomax  17:Psoriasis: his family brought his Desonide 0.05% cream and clobetasol 0.05% cream to apply to hands, elbows, knees daily as needed  18: Rosacea: his family brought in his Soolantra (ivermectin) 1% cream to apply to face daily as needed  19: s/p CABG 2023  20: Osteoarthritis: S/p left shoulder arthroplasty; needs the right shoulder done as well  ***  Milinda Antis, PA-C 06/15/2023

## 2023-06-15 NOTE — Progress Notes (Signed)
Inpatient Rehabilitation Admissions Coordinator   I have insurance approval and CIR bed to admit him to on Saturday. I met with him at bedside and he is in agreement.. 6 N nurse can call rehab Saturday at 12 noon to give report and to clarify when bed will be available to admit. I will make the arrangements.   He stated he felt funny and had a urinal accident this am. I provided his glucerna and notified his CNA and acute team. Requested his CBG to be checked.  Ottie Glazier, RN, MSN Rehab Admissions Coordinator [phone] 06/15/2023 11:18 AM

## 2023-06-16 ENCOUNTER — Inpatient Hospital Stay (HOSPITAL_COMMUNITY)
Admission: RE | Admit: 2023-06-16 | Discharge: 2023-07-10 | DRG: 559 | Disposition: A | Discharge status: Home Health | Payer: Commercial Managed Care - PPO | Source: Intra-hospital | Attending: Physical Medicine and Rehabilitation | Admitting: Physical Medicine and Rehabilitation

## 2023-06-16 ENCOUNTER — Other Ambulatory Visit: Payer: Self-pay

## 2023-06-16 ENCOUNTER — Encounter (HOSPITAL_COMMUNITY): Payer: Self-pay | Admitting: Physical Medicine and Rehabilitation

## 2023-06-16 DIAGNOSIS — D72829 Elevated white blood cell count, unspecified: Secondary | ICD-10-CM | POA: Diagnosis not present

## 2023-06-16 DIAGNOSIS — Z886 Allergy status to analgesic agent status: Secondary | ICD-10-CM

## 2023-06-16 DIAGNOSIS — L97519 Non-pressure chronic ulcer of other part of right foot with unspecified severity: Secondary | ICD-10-CM | POA: Diagnosis present

## 2023-06-16 DIAGNOSIS — D75839 Thrombocytosis, unspecified: Secondary | ICD-10-CM | POA: Diagnosis not present

## 2023-06-16 DIAGNOSIS — Z8616 Personal history of COVID-19: Secondary | ICD-10-CM | POA: Diagnosis not present

## 2023-06-16 DIAGNOSIS — Q059 Spina bifida, unspecified: Secondary | ICD-10-CM | POA: Diagnosis not present

## 2023-06-16 DIAGNOSIS — I480 Paroxysmal atrial fibrillation: Secondary | ICD-10-CM | POA: Diagnosis present

## 2023-06-16 DIAGNOSIS — Z79899 Other long term (current) drug therapy: Secondary | ICD-10-CM

## 2023-06-16 DIAGNOSIS — S72002A Fracture of unspecified part of neck of left femur, initial encounter for closed fracture: Secondary | ICD-10-CM | POA: Diagnosis not present

## 2023-06-16 DIAGNOSIS — G4730 Sleep apnea, unspecified: Secondary | ICD-10-CM

## 2023-06-16 DIAGNOSIS — Z981 Arthrodesis status: Secondary | ICD-10-CM

## 2023-06-16 DIAGNOSIS — L4050 Arthropathic psoriasis, unspecified: Secondary | ICD-10-CM | POA: Diagnosis present

## 2023-06-16 DIAGNOSIS — W19XXXD Unspecified fall, subsequent encounter: Secondary | ICD-10-CM | POA: Diagnosis present

## 2023-06-16 DIAGNOSIS — M7501 Adhesive capsulitis of right shoulder: Secondary | ICD-10-CM | POA: Diagnosis present

## 2023-06-16 DIAGNOSIS — S2242XD Multiple fractures of ribs, left side, subsequent encounter for fracture with routine healing: Secondary | ICD-10-CM | POA: Diagnosis not present

## 2023-06-16 DIAGNOSIS — K5901 Slow transit constipation: Secondary | ICD-10-CM | POA: Diagnosis not present

## 2023-06-16 DIAGNOSIS — I4892 Unspecified atrial flutter: Secondary | ICD-10-CM | POA: Diagnosis not present

## 2023-06-16 DIAGNOSIS — Z96612 Presence of left artificial shoulder joint: Secondary | ICD-10-CM | POA: Diagnosis present

## 2023-06-16 DIAGNOSIS — L719 Rosacea, unspecified: Secondary | ICD-10-CM | POA: Diagnosis present

## 2023-06-16 DIAGNOSIS — Z91041 Radiographic dye allergy status: Secondary | ICD-10-CM

## 2023-06-16 DIAGNOSIS — Z952 Presence of prosthetic heart valve: Secondary | ICD-10-CM | POA: Diagnosis not present

## 2023-06-16 DIAGNOSIS — D631 Anemia in chronic kidney disease: Secondary | ICD-10-CM | POA: Diagnosis present

## 2023-06-16 DIAGNOSIS — Z7984 Long term (current) use of oral hypoglycemic drugs: Secondary | ICD-10-CM

## 2023-06-16 DIAGNOSIS — N1832 Chronic kidney disease, stage 3b: Secondary | ICD-10-CM | POA: Diagnosis present

## 2023-06-16 DIAGNOSIS — E11649 Type 2 diabetes mellitus with hypoglycemia without coma: Secondary | ICD-10-CM | POA: Diagnosis not present

## 2023-06-16 DIAGNOSIS — G8929 Other chronic pain: Secondary | ICD-10-CM | POA: Diagnosis present

## 2023-06-16 DIAGNOSIS — M5431 Sciatica, right side: Secondary | ICD-10-CM | POA: Diagnosis present

## 2023-06-16 DIAGNOSIS — F4322 Adjustment disorder with anxiety: Secondary | ICD-10-CM | POA: Diagnosis not present

## 2023-06-16 DIAGNOSIS — M5432 Sciatica, left side: Secondary | ICD-10-CM | POA: Diagnosis present

## 2023-06-16 DIAGNOSIS — E11621 Type 2 diabetes mellitus with foot ulcer: Secondary | ICD-10-CM | POA: Diagnosis present

## 2023-06-16 DIAGNOSIS — Z888 Allergy status to other drugs, medicaments and biological substances status: Secondary | ICD-10-CM

## 2023-06-16 DIAGNOSIS — I129 Hypertensive chronic kidney disease with stage 1 through stage 4 chronic kidney disease, or unspecified chronic kidney disease: Secondary | ICD-10-CM | POA: Diagnosis present

## 2023-06-16 DIAGNOSIS — Z85828 Personal history of other malignant neoplasm of skin: Secondary | ICD-10-CM

## 2023-06-16 DIAGNOSIS — U071 COVID-19: Secondary | ICD-10-CM | POA: Diagnosis not present

## 2023-06-16 DIAGNOSIS — E119 Type 2 diabetes mellitus without complications: Secondary | ICD-10-CM | POA: Diagnosis not present

## 2023-06-16 DIAGNOSIS — F411 Generalized anxiety disorder: Secondary | ICD-10-CM | POA: Diagnosis not present

## 2023-06-16 DIAGNOSIS — I959 Hypotension, unspecified: Secondary | ICD-10-CM | POA: Diagnosis not present

## 2023-06-16 DIAGNOSIS — N179 Acute kidney failure, unspecified: Secondary | ICD-10-CM | POA: Diagnosis not present

## 2023-06-16 DIAGNOSIS — I878 Other specified disorders of veins: Secondary | ICD-10-CM | POA: Diagnosis present

## 2023-06-16 DIAGNOSIS — Z7985 Long-term (current) use of injectable non-insulin antidiabetic drugs: Secondary | ICD-10-CM

## 2023-06-16 DIAGNOSIS — Z794 Long term (current) use of insulin: Secondary | ICD-10-CM | POA: Diagnosis not present

## 2023-06-16 DIAGNOSIS — N400 Enlarged prostate without lower urinary tract symptoms: Secondary | ICD-10-CM | POA: Diagnosis present

## 2023-06-16 DIAGNOSIS — E785 Hyperlipidemia, unspecified: Secondary | ICD-10-CM | POA: Diagnosis present

## 2023-06-16 DIAGNOSIS — Z8781 Personal history of (healed) traumatic fracture: Secondary | ICD-10-CM

## 2023-06-16 DIAGNOSIS — E875 Hyperkalemia: Secondary | ICD-10-CM | POA: Diagnosis present

## 2023-06-16 DIAGNOSIS — L89152 Pressure ulcer of sacral region, stage 2: Secondary | ICD-10-CM | POA: Diagnosis present

## 2023-06-16 DIAGNOSIS — J690 Pneumonitis due to inhalation of food and vomit: Secondary | ICD-10-CM | POA: Diagnosis not present

## 2023-06-16 DIAGNOSIS — K5900 Constipation, unspecified: Secondary | ICD-10-CM | POA: Diagnosis not present

## 2023-06-16 DIAGNOSIS — I2510 Atherosclerotic heart disease of native coronary artery without angina pectoris: Secondary | ICD-10-CM | POA: Diagnosis present

## 2023-06-16 DIAGNOSIS — G4733 Obstructive sleep apnea (adult) (pediatric): Secondary | ICD-10-CM | POA: Diagnosis present

## 2023-06-16 DIAGNOSIS — Z7951 Long term (current) use of inhaled steroids: Secondary | ICD-10-CM

## 2023-06-16 DIAGNOSIS — Z7902 Long term (current) use of antithrombotics/antiplatelets: Secondary | ICD-10-CM

## 2023-06-16 DIAGNOSIS — I252 Old myocardial infarction: Secondary | ICD-10-CM

## 2023-06-16 DIAGNOSIS — S72142D Displaced intertrochanteric fracture of left femur, subsequent encounter for closed fracture with routine healing: Secondary | ICD-10-CM | POA: Diagnosis present

## 2023-06-16 DIAGNOSIS — Z789 Other specified health status: Secondary | ICD-10-CM

## 2023-06-16 DIAGNOSIS — K602 Anal fissure, unspecified: Secondary | ICD-10-CM | POA: Diagnosis present

## 2023-06-16 DIAGNOSIS — Z951 Presence of aortocoronary bypass graft: Secondary | ICD-10-CM

## 2023-06-16 DIAGNOSIS — K219 Gastro-esophageal reflux disease without esophagitis: Secondary | ICD-10-CM | POA: Diagnosis present

## 2023-06-16 DIAGNOSIS — F4320 Adjustment disorder, unspecified: Secondary | ICD-10-CM

## 2023-06-16 DIAGNOSIS — R296 Repeated falls: Secondary | ICD-10-CM | POA: Diagnosis present

## 2023-06-16 DIAGNOSIS — Z882 Allergy status to sulfonamides status: Secondary | ICD-10-CM

## 2023-06-16 DIAGNOSIS — Z8249 Family history of ischemic heart disease and other diseases of the circulatory system: Secondary | ICD-10-CM

## 2023-06-16 DIAGNOSIS — Z7901 Long term (current) use of anticoagulants: Secondary | ICD-10-CM

## 2023-06-16 DIAGNOSIS — Z9181 History of falling: Secondary | ICD-10-CM

## 2023-06-16 DIAGNOSIS — E1122 Type 2 diabetes mellitus with diabetic chronic kidney disease: Secondary | ICD-10-CM | POA: Diagnosis present

## 2023-06-16 DIAGNOSIS — Z881 Allergy status to other antibiotic agents status: Secondary | ICD-10-CM

## 2023-06-16 DIAGNOSIS — M19011 Primary osteoarthritis, right shoulder: Secondary | ICD-10-CM | POA: Diagnosis present

## 2023-06-16 LAB — COMPREHENSIVE METABOLIC PANEL
ALT: 93 U/L — ABNORMAL HIGH (ref 0–44)
AST: 84 U/L — ABNORMAL HIGH (ref 15–41)
Albumin: 2.1 g/dL — ABNORMAL LOW (ref 3.5–5.0)
Alkaline Phosphatase: 100 U/L (ref 38–126)
Anion gap: 10 (ref 5–15)
BUN: 60 mg/dL — ABNORMAL HIGH (ref 8–23)
CO2: 25 mmol/L (ref 22–32)
Calcium: 8.8 mg/dL — ABNORMAL LOW (ref 8.9–10.3)
Chloride: 101 mmol/L (ref 98–111)
Creatinine, Ser: 1.47 mg/dL — ABNORMAL HIGH (ref 0.61–1.24)
GFR, Estimated: 49 mL/min — ABNORMAL LOW (ref >60)
Glucose, Bld: 181 mg/dL — ABNORMAL HIGH (ref 70–99)
Potassium: 4.8 mmol/L (ref 3.5–5.1)
Sodium: 136 mmol/L (ref 135–145)
Total Bilirubin: 0.8 mg/dL (ref 0.3–1.2)
Total Protein: 6.2 g/dL — ABNORMAL LOW (ref 6.5–8.1)

## 2023-06-16 LAB — CBC WITH DIFFERENTIAL/PLATELET
Abs Immature Granulocytes: 0.1 10*3/uL — ABNORMAL HIGH (ref 0.00–0.07)
Basophils Absolute: 0.1 10*3/uL (ref 0.0–0.1)
Basophils Relative: 0 %
Eosinophils Absolute: 0.4 10*3/uL (ref 0.0–0.5)
Eosinophils Relative: 3 %
HCT: 29.7 % — ABNORMAL LOW (ref 39.0–52.0)
Hemoglobin: 9.2 g/dL — ABNORMAL LOW (ref 13.0–17.0)
Immature Granulocytes: 1 %
Lymphocytes Relative: 6 %
Lymphs Abs: 0.8 10*3/uL (ref 0.7–4.0)
MCH: 24.7 pg — ABNORMAL LOW (ref 26.0–34.0)
MCHC: 31 g/dL (ref 30.0–36.0)
MCV: 79.6 fL — ABNORMAL LOW (ref 80.0–100.0)
Monocytes Absolute: 2 10*3/uL — ABNORMAL HIGH (ref 0.1–1.0)
Monocytes Relative: 15 %
Neutro Abs: 10.3 10*3/uL — ABNORMAL HIGH (ref 1.7–7.7)
Neutrophils Relative %: 75 %
Platelets: 447 10*3/uL — ABNORMAL HIGH (ref 150–400)
RBC: 3.73 MIL/uL — ABNORMAL LOW (ref 4.22–5.81)
RDW: 18.3 % — ABNORMAL HIGH (ref 11.5–15.5)
WBC: 13.6 10*3/uL — ABNORMAL HIGH (ref 4.0–10.5)
nRBC: 0 % (ref 0.0–0.2)

## 2023-06-16 LAB — GLUCOSE, CAPILLARY
Glucose-Capillary: 156 mg/dL — ABNORMAL HIGH (ref 70–99)
Glucose-Capillary: 324 mg/dL — ABNORMAL HIGH (ref 70–99)
Glucose-Capillary: 182 mg/dL — ABNORMAL HIGH (ref 70–99)
Glucose-Capillary: 192 mg/dL — ABNORMAL HIGH (ref 70–99)

## 2023-06-16 MED ORDER — ONDANSETRON HCL 4 MG PO TABS
4.0000 mg | ORAL_TABLET | Freq: Four times a day (QID) | ORAL | Status: DC | PRN
  Administered 2023-07-09: 4 mg via ORAL
  Filled 2023-06-16: qty 1

## 2023-06-16 MED ORDER — FLUTICASONE PROPIONATE 50 MCG/ACT NA SUSP
1.0000 | Freq: Every day | NASAL | Status: DC
  Administered 2023-06-17 – 2023-07-10 (×23): 1 via NASAL
  Filled 2023-06-16 (×2): qty 16

## 2023-06-16 MED ORDER — TORSEMIDE 20 MG PO TABS
20.0000 mg | ORAL_TABLET | Freq: Every day | ORAL | Status: DC
  Administered 2023-06-17 – 2023-07-10 (×24): 20 mg via ORAL
  Filled 2023-06-16 (×24): qty 1

## 2023-06-16 MED ORDER — SORBITOL 70 % SOLN
30.0000 mL | Freq: Every day | Status: DC | PRN
  Administered 2023-06-17 – 2023-06-23 (×2): 30 mL via ORAL
  Filled 2023-06-16 (×2): qty 30

## 2023-06-16 MED ORDER — PANTOPRAZOLE SODIUM 40 MG PO TBEC
40.0000 mg | DELAYED_RELEASE_TABLET | Freq: Every day | ORAL | Status: DC
  Administered 2023-06-17 – 2023-06-23 (×7): 40 mg via ORAL
  Filled 2023-06-16 (×7): qty 1

## 2023-06-16 MED ORDER — ADULT MULTIVITAMIN W/MINERALS CH
1.0000 | ORAL_TABLET | Freq: Every day | ORAL | Status: DC
  Administered 2023-06-17 – 2023-07-04 (×18): 1 via ORAL
  Filled 2023-06-16 (×18): qty 1

## 2023-06-16 MED ORDER — INSULIN ASPART 100 UNIT/ML IJ SOLN
0.0000 [IU] | Freq: Three times a day (TID) | INTRAMUSCULAR | Status: DC
  Administered 2023-06-17: 7 [IU] via SUBCUTANEOUS
  Administered 2023-06-17 (×2): 4 [IU] via SUBCUTANEOUS
  Administered 2023-06-18: 7 [IU] via SUBCUTANEOUS
  Administered 2023-06-18: 11 [IU] via SUBCUTANEOUS
  Administered 2023-06-19: 3 [IU] via SUBCUTANEOUS
  Administered 2023-06-19 (×2): 4 [IU] via SUBCUTANEOUS
  Administered 2023-06-20: 3 [IU] via SUBCUTANEOUS
  Administered 2023-06-20: 4 [IU] via SUBCUTANEOUS
  Administered 2023-06-20: 3 [IU] via SUBCUTANEOUS
  Administered 2023-06-21: 4 [IU] via SUBCUTANEOUS
  Administered 2023-06-21: 7 [IU] via SUBCUTANEOUS
  Administered 2023-06-22 (×3): 4 [IU] via SUBCUTANEOUS
  Administered 2023-06-23: 7 [IU] via SUBCUTANEOUS
  Administered 2023-06-23: 4 [IU] via SUBCUTANEOUS
  Administered 2023-06-24: 3 [IU] via SUBCUTANEOUS
  Administered 2023-06-24: 7 [IU] via SUBCUTANEOUS
  Administered 2023-06-24: 3 [IU] via SUBCUTANEOUS
  Administered 2023-06-25: 4 [IU] via SUBCUTANEOUS
  Administered 2023-06-25 – 2023-06-26 (×3): 7 [IU] via SUBCUTANEOUS
  Administered 2023-06-27 (×2): 4 [IU] via SUBCUTANEOUS
  Administered 2023-06-28: 7 [IU] via SUBCUTANEOUS
  Administered 2023-06-28: 4 [IU] via SUBCUTANEOUS
  Administered 2023-06-29: 3 [IU] via SUBCUTANEOUS
  Administered 2023-06-30: 4 [IU] via SUBCUTANEOUS
  Administered 2023-07-01 – 2023-07-02 (×4): 3 [IU] via SUBCUTANEOUS
  Administered 2023-07-03 (×2): 4 [IU] via SUBCUTANEOUS
  Administered 2023-07-04: 2 [IU] via SUBCUTANEOUS
  Administered 2023-07-04 – 2023-07-05 (×3): 3 [IU] via SUBCUTANEOUS
  Administered 2023-07-06 – 2023-07-07 (×2): 4 [IU] via SUBCUTANEOUS
  Administered 2023-07-07: 7 [IU] via SUBCUTANEOUS
  Administered 2023-07-08: 3 [IU] via SUBCUTANEOUS
  Administered 2023-07-08: 4 [IU] via SUBCUTANEOUS
  Administered 2023-07-08: 7 [IU] via SUBCUTANEOUS
  Administered 2023-07-09: 4 [IU] via SUBCUTANEOUS
  Administered 2023-07-09 – 2023-07-10 (×3): 3 [IU] via SUBCUTANEOUS

## 2023-06-16 MED ORDER — MODAFINIL 100 MG PO TABS
100.0000 mg | ORAL_TABLET | Freq: Every day | ORAL | Status: DC
  Administered 2023-06-17 – 2023-07-10 (×24): 100 mg via ORAL
  Filled 2023-06-16 (×24): qty 1

## 2023-06-16 MED ORDER — OXYCODONE HCL 5 MG PO TABS
20.0000 mg | ORAL_TABLET | ORAL | Status: DC | PRN
  Administered 2023-06-16 – 2023-07-09 (×86): 20 mg via ORAL
  Filled 2023-06-16 (×90): qty 4

## 2023-06-16 MED ORDER — INSULIN LISPRO 100 UNIT/ML (KWIKPEN): 10.0000 [IU] | PEN_INJECTOR | Freq: Three times a day (TID) | SUBCUTANEOUS | 0 refills | Status: DC

## 2023-06-16 MED ORDER — MAGNESIUM OXIDE -MG SUPPLEMENT 400 (240 MG) MG PO TABS
400.0000 mg | ORAL_TABLET | Freq: Every day | ORAL | Status: DC
  Administered 2023-06-17 – 2023-07-04 (×18): 400 mg via ORAL
  Filled 2023-06-16 (×18): qty 1

## 2023-06-16 MED ORDER — INSULIN GLARGINE-YFGN 100 UNIT/ML ~~LOC~~ SOLN
25.0000 [IU] | Freq: Two times a day (BID) | SUBCUTANEOUS | Status: DC
  Administered 2023-06-16 – 2023-06-17 (×2): 25 [IU] via SUBCUTANEOUS
  Filled 2023-06-16 (×3): qty 0.25

## 2023-06-16 MED ORDER — PREGABALIN 50 MG PO CAPS
100.0000 mg | ORAL_CAPSULE | Freq: Two times a day (BID) | ORAL | Status: DC
  Administered 2023-06-16 – 2023-07-10 (×48): 100 mg via ORAL
  Filled 2023-06-16 (×48): qty 2

## 2023-06-16 MED ORDER — POLYETHYLENE GLYCOL 3350 17 G PO PACK
17.0000 g | PACK | Freq: Every day | ORAL | Status: DC | PRN
  Administered 2023-06-29 – 2023-07-01 (×2): 17 g via ORAL

## 2023-06-16 MED ORDER — ALUM & MAG HYDROXIDE-SIMETH 200-200-20 MG/5ML PO SUSP
30.0000 mL | ORAL | Status: DC | PRN
  Administered 2023-06-29 – 2023-07-04 (×7): 30 mL via ORAL
  Filled 2023-06-16 (×7): qty 30

## 2023-06-16 MED ORDER — HYDROXYZINE HCL 25 MG PO TABS
25.0000 mg | ORAL_TABLET | Freq: Every day | ORAL | Status: DC | PRN
  Administered 2023-06-20 – 2023-06-21 (×2): 25 mg via ORAL
  Filled 2023-06-16 (×2): qty 1

## 2023-06-16 MED ORDER — FLEET ENEMA 7-19 GM/118ML RE ENEM: 1.0000 | ENEMA | Freq: Once | RECTAL | Status: DC | PRN

## 2023-06-16 MED ORDER — GUAIFENESIN-DM 100-10 MG/5ML PO SYRP
10.0000 mL | ORAL_SOLUTION | Freq: Four times a day (QID) | ORAL | Status: DC | PRN
  Administered 2023-06-17 – 2023-07-10 (×16): 10 mL via ORAL
  Filled 2023-06-16 (×18): qty 10

## 2023-06-16 MED ORDER — OXYCODONE HCL 5 MG PO TABS: 10.0000 mg | ORAL_TABLET | ORAL | Status: DC | PRN

## 2023-06-16 MED ORDER — TAMSULOSIN HCL 0.4 MG PO CAPS
0.4000 mg | ORAL_CAPSULE | Freq: Every day | ORAL | Status: DC
  Administered 2023-06-17 – 2023-07-04 (×18): 0.4 mg via ORAL
  Filled 2023-06-16 (×18): qty 1

## 2023-06-16 MED ORDER — APIXABAN 5 MG PO TABS
5.0000 mg | ORAL_TABLET | Freq: Two times a day (BID) | ORAL | Status: DC
  Administered 2023-06-16 – 2023-07-10 (×48): 5 mg via ORAL
  Filled 2023-06-16 (×48): qty 1

## 2023-06-16 MED ORDER — ALBUTEROL SULFATE (2.5 MG/3ML) 0.083% IN NEBU
2.5000 mg | INHALATION_SOLUTION | RESPIRATORY_TRACT | Status: DC | PRN
  Administered 2023-06-17 – 2023-06-20 (×3): 2.5 mg via RESPIRATORY_TRACT
  Filled 2023-06-16 (×3): qty 3

## 2023-06-16 MED ORDER — MOMETASONE FURO-FORMOTEROL FUM 200-5 MCG/ACT IN AERO
2.0000 | INHALATION_SPRAY | Freq: Two times a day (BID) | RESPIRATORY_TRACT | Status: DC
  Administered 2023-06-18 – 2023-07-10 (×41): 2 via RESPIRATORY_TRACT
  Filled 2023-06-16: qty 8.8

## 2023-06-16 MED ORDER — CLOPIDOGREL BISULFATE 75 MG PO TABS
75.0000 mg | ORAL_TABLET | Freq: Every day | ORAL | Status: DC
  Administered 2023-06-17 – 2023-07-10 (×24): 75 mg via ORAL
  Filled 2023-06-16 (×24): qty 1

## 2023-06-16 MED ORDER — ATORVASTATIN CALCIUM 80 MG PO TABS
80.0000 mg | ORAL_TABLET | Freq: Every day | ORAL | Status: DC
  Administered 2023-06-17 – 2023-07-04 (×18): 80 mg via ORAL
  Filled 2023-06-16: qty 2
  Filled 2023-06-16 (×17): qty 1

## 2023-06-16 MED ORDER — VILAZODONE HCL 20 MG PO TABS
40.0000 mg | ORAL_TABLET | Freq: Every day | ORAL | Status: DC
  Administered 2023-06-17 – 2023-07-10 (×24): 40 mg via ORAL
  Filled 2023-06-16 (×24): qty 2

## 2023-06-16 MED ORDER — EZETIMIBE 10 MG PO TABS
10.0000 mg | ORAL_TABLET | Freq: Every day | ORAL | Status: DC
  Administered 2023-06-17 – 2023-07-04 (×18): 10 mg via ORAL
  Filled 2023-06-16 (×18): qty 1

## 2023-06-16 MED ORDER — LANTUS SOLOSTAR 100 UNIT/ML ~~LOC~~ SOPN: 30.0000 [IU] | PEN_INJECTOR | Freq: Every day | SUBCUTANEOUS | 0 refills | Status: DC

## 2023-06-16 MED ORDER — NABUMETONE 500 MG PO TABS
750.0000 mg | ORAL_TABLET | Freq: Two times a day (BID) | ORAL | Status: DC
  Administered 2023-06-16 – 2023-06-19 (×6): 750 mg via ORAL
  Filled 2023-06-16 (×6): qty 2

## 2023-06-16 MED ORDER — ONDANSETRON HCL 4 MG/2ML IJ SOLN: 4.0000 mg | Freq: Four times a day (QID) | INTRAMUSCULAR | Status: DC | PRN

## 2023-06-16 MED ORDER — FERROUS SULFATE 325 (65 FE) MG PO TABS: 325.0000 mg | ORAL_TABLET | Freq: Three times a day (TID) | ORAL | 0 refills | Status: DC

## 2023-06-16 MED ORDER — INSULIN ASPART 100 UNIT/ML IJ SOLN
12.0000 [IU] | Freq: Three times a day (TID) | INTRAMUSCULAR | Status: DC
  Administered 2023-06-17 – 2023-07-08 (×58): 12 [IU] via SUBCUTANEOUS

## 2023-06-16 MED ORDER — SENNA 8.6 MG PO TABS
1.0000 | ORAL_TABLET | Freq: Two times a day (BID) | ORAL | Status: DC
  Administered 2023-06-16 – 2023-06-24 (×17): 8.6 mg via ORAL
  Filled 2023-06-16 (×18): qty 1

## 2023-06-16 MED ORDER — HYDROCERIN EX CREA
TOPICAL_CREAM | Freq: Two times a day (BID) | CUTANEOUS | Status: DC
  Filled 2023-06-16 (×2): qty 113

## 2023-06-16 MED ORDER — FERROUS SULFATE 325 (65 FE) MG PO TABS
325.0000 mg | ORAL_TABLET | Freq: Three times a day (TID) | ORAL | Status: DC
  Administered 2023-06-16 – 2023-07-04 (×53): 325 mg via ORAL
  Filled 2023-06-16 (×53): qty 1

## 2023-06-16 MED ORDER — ACETAMINOPHEN 325 MG PO TABS
325.0000 mg | ORAL_TABLET | ORAL | Status: DC | PRN
  Administered 2023-06-28 – 2023-07-07 (×3): 650 mg via ORAL
  Filled 2023-06-16 (×5): qty 2

## 2023-06-16 MED ORDER — ACETAMINOPHEN 500 MG PO TABS
500.0000 mg | ORAL_TABLET | Freq: Four times a day (QID) | ORAL | Status: DC
  Administered 2023-06-16 – 2023-07-04 (×68): 500 mg via ORAL
  Filled 2023-06-16 (×70): qty 1

## 2023-06-16 MED ORDER — DOCUSATE SODIUM 100 MG PO CAPS
100.0000 mg | ORAL_CAPSULE | Freq: Two times a day (BID) | ORAL | Status: DC
  Administered 2023-06-16 – 2023-06-24 (×17): 100 mg via ORAL
  Filled 2023-06-16 (×18): qty 1

## 2023-06-16 MED ORDER — GLUCERNA SHAKE PO LIQD
237.0000 mL | Freq: Three times a day (TID) | ORAL | Status: DC
  Administered 2023-06-16 – 2023-06-20 (×11): 237 mL via ORAL

## 2023-06-16 MED ORDER — OXYCODONE HCL 5 MG PO TABS: 5.0000 mg | ORAL_TABLET | ORAL | Status: DC | PRN

## 2023-06-16 MED ORDER — METHOCARBAMOL 500 MG PO TABS
500.0000 mg | ORAL_TABLET | Freq: Four times a day (QID) | ORAL | Status: DC | PRN
  Administered 2023-06-17 – 2023-07-09 (×24): 500 mg via ORAL
  Filled 2023-06-16 (×24): qty 1

## 2023-06-16 MED ORDER — INSULIN ASPART 100 UNIT/ML IJ SOLN
0.0000 [IU] | Freq: Every day | INTRAMUSCULAR | Status: DC
  Administered 2023-06-18 – 2023-07-05 (×5): 2 [IU] via SUBCUTANEOUS

## 2023-06-16 MED ORDER — MIRTAZAPINE 15 MG PO TABS
30.0000 mg | ORAL_TABLET | Freq: Every day | ORAL | Status: DC
  Administered 2023-06-16 – 2023-07-09 (×22): 30 mg via ORAL
  Filled 2023-06-16 (×22): qty 2

## 2023-06-16 MED ORDER — MODAFINIL 100 MG PO TABS: 100.0000 mg | ORAL_TABLET | Freq: Every day | ORAL | 0 refills | Status: DC

## 2023-06-16 MED ORDER — ISOSORBIDE MONONITRATE ER 60 MG PO TB24
30.0000 mg | ORAL_TABLET | Freq: Every day | ORAL | Status: DC
  Administered 2023-06-17 – 2023-07-10 (×24): 30 mg via ORAL
  Filled 2023-06-16 (×24): qty 1

## 2023-06-16 NOTE — Progress Notes (Signed)
Pt Home CPAP unit set up. Pt places self on/off   06/16/23 2058  BiPAP/CPAP/SIPAP  BiPAP/CPAP/SIPAP Pt Type Adult (Home unit)  BiPAP/CPAP/SIPAP Resmed  Mask Type Nasal mask (Home mask)  EPAP 18 cmH2O  FiO2 (%) 21 %  Patient Home Equipment Yes

## 2023-06-16 NOTE — Progress Notes (Signed)
Pt places self on/off.   06/16/23 0002  BiPAP/CPAP/SIPAP  $ Non-Invasive Home Ventilator  Subsequent  BiPAP/CPAP/SIPAP Pt Type Adult  BiPAP/CPAP/SIPAP DREAMSTATIOND  Mask Type Nasal mask (Home mask)  EPAP 17 cmH2O  FiO2 (%) 21 %

## 2023-06-16 NOTE — Discharge Summary (Signed)
Physician Discharge Summary  Dylan Bruce ATF:[id number] DOB: 30-Jun-1946 DOA: 06/09/2023  PCP: Lindwood Qua, MD  Admit date: 06/09/2023 Discharge date: 06/16/2023  Admitted From: Previous facility Disposition: Inpatient rehab  Recommendations for Outpatient Follow-up:  Follow up with PCP in 1-2 weeks Follow-up with orthopedic surgery as scheduled  Discharge Condition: Stable CODE STATUS: Full Diet recommendation: Diabetic diet  Brief/Interim Summary: Dylan Bruce is a 77 y.o. male with medical history significant of TAVR, DM, HTN. Patient presented the day prior to the ED at [facility], after a fall following PT session that he was at for frequent falls. At that time CT head did not show any acute abnormality, x-ray showed left rib fracture, femur x-ray showed no acute abnormality. Patient returns to ED today after continued left hip pain and rib pain.  CT today confirmed intertrochanteric left hip fracture.  Orthopedics here consulted based on patient's prior treatment and preference and accepted patient to be seen for surgical intervention at [facility] with hospitalist to admit.  Discharge Diagnoses:  Principal Problem:   Closed left hip fracture, initial encounter (HCC) Active Problems:   Fracture of multiple ribs of left side   Venous stasis ulcer of right lower leg with edema of right lower leg (HCC)   S/P TAVR (transcatheter aortic valve replacement)   Squamous cell carcinoma of skin   Type 2 diabetes mellitus with hyperlipidemia (HCC)   Essential hypertension   Chronic kidney disease, stage 3b (HCC)   PAF (paroxysmal atrial fibrillation) (HCC)   Diabetic ulcer of right foot (HCC)   Protein-calorie malnutrition, severe  Intertrochanteric left hip fracture: s/p IM nail 8/4 by Dr. [name].  - Eliquis for VTE ppx - Pain control per orthopedics - continue home lyrica/oxycodone  - WBAT, continued therapy as above   Right lower leg wound in presence of venous insufficiency:   - No longer appears infected, DC abx.  - Local wound care.   - Tx venous insufficiency w/compression/elevation   Fracture of multiple ribs of left side, POA: - Continue pain control, incentive spirometry.   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  - Normal function and LVEF June 2024.  - Continue plavix, continue home meds including isosorbide and demadex.   Pre-ulcerative callus in diabetic foot:  - No surrounding erythema, exudate, edema, crepitus, or other finding on foot to suggest localized infection. -Outpatient podiatry follow up   Paroxysmal afib/flutter:  - Stable in 4:1 atrial flutter.  - Continue eliquis postoperatively. We have discussed risks, benefits,and alternatives given his frequent falls; including son in discussion. We will continue DOAC for now but this may need to be addressed in the future if patient's risk of bleeding outweighs the benefit of anticoagulation  - Rate controlled - on no meds prior to admission    Atypical chest/epigastric discomfort:  - Noted 8/6 with peak troponin at 23; EKG without overt ST elevation - Improved with maalox -likely GI in origin   Stage IIIa CKD  - Monitor BMP postoperatively, stopped empagliflozin. CrCl stable, will continue torsemide.   HTN:  - Continue imdur.    T2DM uncontrolled with hyperglycemia: - HbA1c 8.5%.  -Resume home p.o. medications, insulin as below   BPH:  - Continue tamsulosin   HLD:  - Continue statin, zetia   Concern for sleep disorder, known OSA: Pt seems to fall asleep abruptly inappropriately and return to full wakefulness abruptly once disturbed. This has been  more chronic and worsening, likely exaggerated in the perioperative period. He denies daytime sleepiness. - Start trial of modafinil, d/w PM&R - Needs referral to neurology. D/w pt and son. - Restrictions on driving, etc. discussed at length and will be reinforced.  -  Continue CPAP qHS and would likely benefit from titration to r/o undertreatment   COPD: Quiescent.  - Continue scheduled BD, prn albuterol  Discharge Instructions   Allergies as of 06/16/2023       Reactions   Cefuroxime Diarrhea, Nausea And Vomiting   Severe. Can take Ancef 2g IV on 02/21/2023 without reporting any issue   Ibuprofen Anaphylaxis, Shortness Of Breath      Iodinated Contrast Media Shortness Of Breath, Other (See Comments)   Pt developed burning of face and SOB after 02/06/2023 CT scan    Cefpodoxime Rash   Ciprofloxacin Rash   Doxycycline Rash   Sulfamethoxazole-trimethoprim Rash   Fluocinolone Other (See Comments)   Unknown reaction   Probenecid Other (See Comments)   Unknown reaction   Allopurinol Rash   Azithromycin Rash   Sulfa Antibiotics Rash        Medication List     STOP taking these medications    amoxicillin-clavulanate 875-125 MG tablet Commonly known as: AUGMENTIN   DOXYCYCLINE CALCIUM PO       TAKE these medications    albuterol 108 (90 Base) MCG/ACT inhaler Commonly known as: VENTOLIN HFA Inhale 2 puffs into the lungs every 4 (four) hours as needed for wheezing.   Anucort-HC 25 MG suppository Generic drug: hydrocortisone Place 1 suppository rectally 2 (two) times daily as needed for hemorrhoids.   apixaban 5 MG Tabs tablet Commonly known as: ELIQUIS Take 1 tablet (5 mg total) by mouth 2 (two) times daily.   atorvastatin 80 MG tablet Commonly known as: Lipitor Take 1 tablet (80 mg total) by mouth daily.   budesonide-formoterol 160-4.5 MCG/ACT inhaler Commonly known as: SYMBICORT Inhale 2 puffs into the lungs in the morning and at bedtime.   clobetasol cream 0.05 % Commonly known as: TEMOVATE Apply 1 application topically daily as needed (rash).   clopidogrel 75 MG tablet Commonly known as: PLAVIX Take 1 tablet (75 mg total) by mouth daily.   Cosentyx Sensoready (300 MG) 150 MG/ML Soaj Generic drug: Secukinumab (300  MG Dose) Inject 300 mg into the skin every 30 (thirty) days.   cyclobenzaprine 10 MG tablet Commonly known as: FLEXERIL Take 10 mg by mouth at bedtime.   desonide 0.05 % cream Commonly known as: DESOWEN Apply 1 application topically daily as needed (rash).   docusate sodium 100 MG capsule Commonly known as: COLACE Take 100 mg by mouth daily.   esomeprazole 40 MG capsule Commonly known as: NEXIUM Take 40 mg by mouth daily before breakfast.   ezetimibe 10 MG tablet Commonly known as: ZETIA Take 10 mg by mouth daily.   fenofibrate 145 MG tablet Commonly known as: TRICOR Take 145 mg by mouth daily.   ferrous sulfate 325 (65 FE) MG tablet Take 1 tablet (325 mg total) by mouth 3 (three) times daily after meals.   fluticasone 50 MCG/ACT nasal spray Commonly known as: FLONASE Place 1 spray into both nostrils in the morning and at bedtime.   hydrOXYzine 25 MG capsule Commonly known as: VISTARIL Take 25 mg by mouth daily as needed for itching (sleep).   insulin lispro 100 UNIT/ML KiwkPen Commonly known as: HUMALOG Inject 0.1 mLs (10 Units total) into the skin 3 (three) times daily  with meals. What changed: how much to take   isosorbide mononitrate 30 MG 24 hr tablet Commonly known as: IMDUR Take 1 tablet (30 mg total) by mouth daily. Please schedule appointment for refills.   Jardiance 10 MG Tabs tablet Generic drug: empagliflozin Take 10 mg by mouth daily.   Lantus SoloStar 100 UNIT/ML Solostar Pen Generic drug: insulin glargine Inject 30 Units into the skin at bedtime. What changed: how much to take   Lyrica 100 MG capsule Generic drug: pregabalin Take 1 capsule (100 mg total) by mouth 2 (two) times daily.   magnesium oxide 400 (240 Mg) MG tablet Commonly known as: MAG-OX Take 400 mg by mouth daily.   meclizine 12.5 MG tablet Commonly known as: ANTIVERT Take 12.5 mg by mouth 3 (three) times daily as needed for dizziness.   metFORMIN 1000 MG tablet Commonly  known as: GLUCOPHAGE Take 1,000 mg by mouth 2 (two) times daily.   mirtazapine 30 MG tablet Commonly known as: REMERON Take 1 tablet by mouth at bedtime.   modafinil 100 MG tablet Commonly known as: PROVIGIL Take 1 tablet (100 mg total) by mouth daily. Start taking on: June 17, 2023   nitroGLYCERIN 0.4 MG SL tablet Commonly known as: NITROSTAT Place 1 tablet (0.4 mg total) under the tongue every 5 (five) minutes as needed for chest pain. What changed:  when to take this reasons to take this   nystatin cream Commonly known as: MYCOSTATIN Apply 1 Application topically 2 (two) times daily.   Ozempic (1 MG/DOSE) 4 MG/3ML Sopn Generic drug: Semaglutide (1 MG/DOSE) Inject 4 mg into the skin once a week.   Percocet 10-325 MG tablet Generic drug: oxyCODONE-acetaminophen Take 1-2 tablets by mouth 2 (two) times daily as needed for pain.   Soolantra 1 % Crea Generic drug: Ivermectin Apply 1 application  topically daily.   tamsulosin 0.4 MG Caps capsule Commonly known as: FLOMAX Take 0.4 capsules by mouth daily.   tiZANidine 4 MG tablet Commonly known as: ZANAFLEX Take 4 mg by mouth at bedtime.   torsemide 20 MG tablet Commonly known as: DEMADEX Take 20 mg by mouth daily. What changed: Another medication with the same name was removed. Continue taking this medication, and follow the directions you see here.   triamcinolone cream 0.1 % Commonly known as: KENALOG Apply 1 Application topically 2 (two) times daily as needed (for itching).   Uloric 40 MG tablet Generic drug: febuxostat Take 1 tablet by mouth Daily.   Viibryd 40 MG Tabs Generic drug: Vilazodone HCl Take 40 mg by mouth Daily.        Follow-up Information     Teryl Lucy, MD. Schedule an appointment as soon as possible for a visit in 2 week(s).   Specialty: Orthopedic Surgery Contact information: [address] [place] [address] [phone]                 Allergies  Allergen Reactions   Cefuroxime Diarrhea and Nausea And Vomiting    Severe. Can take Ancef 2g IV on 02/21/2023 without reporting any issue   Ibuprofen Anaphylaxis and Shortness Of Breath        Iodinated Contrast Media Shortness Of Breath and Other (See Comments)    Pt developed burning of face and SOB after 02/06/2023 CT scan    Cefpodoxime Rash   Ciprofloxacin Rash   Doxycycline Rash   Sulfamethoxazole-Trimethoprim Rash   Fluocinolone Other (See Comments)    Unknown reaction   Probenecid  Other (See Comments)    Unknown reaction   Allopurinol Rash   Azithromycin Rash   Sulfa Antibiotics Rash    Consultations: Orthopedic surgery   Procedures/Studies: VAS [place] ABI WITH/WO TBI  Result Date: 06/11/2023  LOWER EXTREMITY DOPPLER STUDY Patient Name:  JOAO KINGMAN  Date of Exam:   06/11/2023 Medical Rec #: [id number]       Accession #:    [id number] Date of Birth: Nov 19, 1945       Patient Gender: M Patient Age:   77 years Exam Location:  [facility] Procedure:      VAS [place] ABI WITH/WO TBI Referring Phys: Janine Ores --------------------------------------------------------------------------------  Indications: Ulceration. High Risk Factors: Hypertension, hyperlipidemia, Diabetes, no history of                    smoking, coronary artery disease.  Comparison Study: Previous 09/03/18 Performing Technologist: McKayla Maag RVT, [place]  Examination Guidelines: A complete evaluation includes at minimum, Doppler waveform signals and systolic blood pressure reading at the level of bilateral brachial, anterior tibial, and posterior tibial arteries, when vessel segments are accessible. Bilateral testing is considered an integral part of a complete examination. Photoelectric Plethysmograph (PPG) waveforms and toe systolic pressure readings are included as required and additional duplex testing as needed. Limited examinations for reoccurring indications may be performed as noted.  ABI Findings:  +---------+------------------+-----+---------+----------------+ Right    Rt Pressure (mmHg)IndexWaveform Comment          +---------+------------------+-----+---------+----------------+ Brachial 118                    triphasic                 +---------+------------------+-----+---------+----------------+ PTA      164               1.39 triphasic                 +---------+------------------+-----+---------+----------------+ DP       255               2.16 triphasicNon-compressible +---------+------------------+-----+---------+----------------+ Great Toe34                0.29 Abnormal                  +---------+------------------+-----+---------+----------------+ +---------+------------------+-----+---------+---------------------------------+ Left     Lt Pressure (mmHg)IndexWaveform Comment                           +---------+------------------+-----+---------+---------------------------------+ Brachial                        triphasicNot pressure obtained due to IV                                            placement                         +---------+------------------+-----+---------+---------------------------------+ PTA      156               1.32 triphasic                                  +---------+------------------+-----+---------+---------------------------------+ DP       118  1.00 triphasic                                  +---------+------------------+-----+---------+---------------------------------+ Great Toe52                0.44 Abnormal                                   +---------+------------------+-----+---------+---------------------------------+ +-------+----------------+-----------+------------+----------------+ ABI/TBIToday's ABI     Today's TBIPrevious ABIPrevious TBI     +-------+----------------+-----------+------------+----------------+ Right  Non-compressible0.29       0.91         Non-compressible +-------+----------------+-----------+------------+----------------+ Left   1.32            0.44       1.01        Non-compressible +-------+----------------+-----------+------------+----------------+  Summary: Right: Resting right ankle-brachial index indicates noncompressible right lower extremity arteries. The right toe-brachial index is abnormal. Left: Resting left ankle-brachial index is within normal range. The left toe-brachial index is abnormal. *See table(s) above for measurements and observations.  Electronically signed by Gerarda Fraction on 06/11/2023 at 7:45:32 PM.    Final    DG HIP UNILAT WITH PELVIS 2-3 VIEWS LEFT  Result Date: 06/10/2023 CLINICAL DATA:  Status post ORIF of the left femur. EXAM: DG HIP (WITH OR WITHOUT PELVIS) 2-3V LEFT COMPARISON:  06/09/2023 FINDINGS: Five images obtained via portable C-arm radiography in the operating room show open reduction and internal fixation of the proximal left femur fracture. Placement of IM nail and hip screw noted. Hardware components are in anatomic alignment. IMPRESSION: Status post ORIF of the proximal left femur fracture. Electronically Signed   By: Signa Kell M.D.   On: 06/10/2023 12:33   DG Hip Port Unilat With Pelvis 1V Left  Result Date: 06/10/2023 CLINICAL DATA:  Left femur fracture status post ORIF EXAM: DG HIP (WITH OR WITHOUT PELVIS) 1V PORT LEFT COMPARISON:  06/09/23 FINDINGS: Postop change from ORIF of the proximal left femur fracture. There is been interval placement of IM nail and hip screw. Hardware components and fracture fragments are in anatomic alignment. IMPRESSION: Postop change from ORIF of the proximal left femur fracture. Electronically Signed   By: Signa Kell M.D.   On: 06/10/2023 12:05   DG C-Arm 1-60 Min-No Report  Result Date: 06/10/2023 Fluoroscopy was utilized by the requesting physician.  No radiographic interpretation.   DG HIP UNILAT WITH PELVIS 2-3 VIEWS LEFT  Result Date:  06/09/2023 CLINICAL DATA:  [id number] Fracture [id number] EXAM: DG HIP (WITH OR WITHOUT PELVIS) 2-3V LEFT COMPARISON:  CT abdomen pelvis 02/06/2023 FINDINGS: Limited evaluation due to overlapping osseous structures and overlying soft tissues. There is no evidence of definite acute displaced hip fracture or dislocation of the left hip. Question vague intertrochanteric lucency that could represent a nondisplaced fracture. Frontal view the right hip unremarkable. No acute displaced fracture or diastasis of the bones of the pelvis. There is no evidence of arthropathy or other focal bone abnormality. Vascular calcification. L5-S1 surgical hardware. IMPRESSION: Limited evaluation due to overlapping osseous structures and overlying soft tissues. Negative for definite acute traumatic injury. Question vague intertrochanteric lucency that could represent a nondisplaced fracture. If high clinical suspicion for traumatic injury, please consider CT. Electronically Signed   By: Tish Frederickson M.D.   On: 06/09/2023 23:06   DG CHEST PORT 1 VIEW  Result Date: 06/09/2023 CLINICAL DATA:  Coronary artery disease, preoperative examination EXAM: PORTABLE CHEST 1 VIEW COMPARISON:  02/19/2023 FINDINGS: The lungs are symmetrically expanded. Previously noted interstitial pulmonary edema has resolved. Coronary artery bypass grafting has been performed. Cardiac size it is within normal limits. Central pulmonary vascular congestion again noted. No pneumothorax or pleural effusion. No acute bone abnormality. Left total shoulder arthroplasty partially visualized. Advanced degenerative changes noted within the right shoulder. IMPRESSION: 1. Central pulmonary vascular congestion without superimposed overt pulmonary edema. Electronically Signed   By: Helyn Numbers M.D.   On: 06/09/2023 22:39     Subjective: No acute issues or events overnight   Discharge Exam: Vitals:   06/16/23 0742 06/16/23 0823  BP: 122/64   Pulse: 69 61  Resp: 18 16   Temp: 98 F (36.7 C)   SpO2: 94% 92%   Vitals:   06/15/23 2010 06/16/23 0437 06/16/23 0742 06/16/23 0823  BP: 106/60 128/61 122/64   Pulse: 72 70 69 61  Resp: 20 16 18 16   Temp: 98.1 F (36.7 C) 98.2 F (36.8 C) 98 F (36.7 C)   TempSrc: Oral Oral    SpO2: 99% 97% 94% 92%  Weight:      Height:        Gen: No distress Pulm: Clear, nonlabored  CV: RRR, no MRG or pitting edema GI: Soft, NT, ND, +BS Neuro: Alert and oriented. No new focal deficits. Ext: Warm, no deformities Skin: RLE dressing clean/dry/intact    The results of significant diagnostics from this hospitalization (including imaging, microbiology, ancillary and laboratory) are listed below for reference.     Microbiology: Recent Results (from the past 240 hour(s))  MRSA Next Gen by PCR, Nasal     Status: None   Collection Time: 06/10/23 12:43 AM   Specimen: Nasal Mucosa; Nasal Swab  Result Value Ref Range Status   MRSA by PCR Next Gen NOT DETECTED NOT DETECTED Final    Comment: (NOTE) The GeneXpert MRSA Assay (FDA approved for NASAL specimens only), is one component of a comprehensive MRSA colonization surveillance program. It is not intended to diagnose MRSA infection nor to guide or monitor treatment for MRSA infections. Test performance is not FDA approved in patients less than 42 years old. Performed at [facility], [address] [place], [place] [address]      Labs: BNP (last 3 results) Recent Labs    02/19/23 1443  BNP 331.6*   Basic Metabolic Panel: Recent Labs  Lab 06/10/23 0430 06/11/23 0003 06/11/23 2344 06/13/23 0008 06/15/23 0125  NA 138 136 137 135 133*  K 3.8 4.4 3.8 4.3 4.9  CL 104 103 104 102 98  CO2 24 24 22 24 24   GLUCOSE 235* 349* 337* 287* 340*  BUN 26* 35* 36* 39* 51*  CREATININE 0.92 1.34* 1.32* 1.37* 1.58*  CALCIUM 8.8* 8.6* 8.3* 8.4* 8.6*   Liver Function Tests: Recent Labs  Lab 06/10/23 0430  AST 24  ALT 30  ALKPHOS 110  BILITOT 0.6  PROT  6.4*  ALBUMIN 2.6*   No results for input(s): "LIPASE", "AMYLASE" in the last 168 hours. No results for input(s): "AMMONIA" in the last 168 hours. CBC: Recent Labs  Lab 06/10/23 0430 06/11/23 0003 06/11/23 2344 06/13/23 0008 06/15/23 0125  WBC 10.3 13.0* 11.0* 9.4 11.2*  HGB 12.0* 10.9* 10.0* 10.2* 9.5*  HCT 37.4* 34.8* 32.3* 33.2* 30.4*  MCV 79.4* 80.9 82.2 80.8 81.5  PLT 273 268 279 299 337   Cardiac Enzymes: No results for  input(s): "CKTOTAL", "CKMB", "CKMBINDEX", "TROPONINI" in the last 168 hours. BNP: Invalid input(s): "POCBNP" CBG: Recent Labs  Lab 06/15/23 1202 06/15/23 1709 06/15/23 2155 06/16/23 0741 06/16/23 1138  GLUCAP 177* 242* 166* 156* 324*   D-Dimer No results for input(s): "DDIMER" in the last 72 hours. Hgb A1c No results for input(s): "HGBA1C" in the last 72 hours. Lipid Profile No results for input(s): "CHOL", "HDL", "LDLCALC", "TRIG", "CHOLHDL", "LDLDIRECT" in the last 72 hours. Thyroid function studies No results for input(s): "TSH", "T4TOTAL", "T3FREE", "THYROIDAB" in the last 72 hours.  Invalid input(s): "FREET3" Anemia work up No results for input(s): "VITAMINB12", "FOLATE", "FERRITIN", "TIBC", "IRON", "RETICCTPCT" in the last 72 hours. Urinalysis    Component Value Date/Time   COLORURINE STRAW (A) 02/19/2023 0823   APPEARANCEUR CLEAR 02/19/2023 0823   LABSPEC 1.009 02/19/2023 0823   PHURINE 5.0 02/19/2023 0823   GLUCOSEU 150 (A) 02/19/2023 0823   HGBUR NEGATIVE 02/19/2023 0823   BILIRUBINUR NEGATIVE 02/19/2023 0823   KETONESUR NEGATIVE 02/19/2023 0823   PROTEINUR NEGATIVE 02/19/2023 0823   UROBILINOGEN 1.0 09/28/2007 1319   NITRITE NEGATIVE 02/19/2023 0823   LEUKOCYTESUR NEGATIVE 02/19/2023 0823   Sepsis Labs Recent Labs  Lab 06/11/23 0003 06/11/23 2344 06/13/23 0008 06/15/23 0125  WBC 13.0* 11.0* 9.4 11.2*   Microbiology Recent Results (from the past 240 hour(s))  MRSA Next Gen by PCR, Nasal     Status: None    Collection Time: 06/10/23 12:43 AM   Specimen: Nasal Mucosa; Nasal Swab  Result Value Ref Range Status   MRSA by PCR Next Gen NOT DETECTED NOT DETECTED Final    Comment: (NOTE) The GeneXpert MRSA Assay (FDA approved for NASAL specimens only), is one component of a comprehensive MRSA colonization surveillance program. It is not intended to diagnose MRSA infection nor to guide or monitor treatment for MRSA infections. Test performance is not FDA approved in patients less than 42 years old. Performed at [facility], [address] [place], [place] [address]      Time coordinating discharge: Over 30 minutes  SIGNED:   Azucena Fallen, DO [facility] 06/16/2023, 11:47 AM Pager   If 7PM-7AM, please contact night-coverage [email or web address]

## 2023-06-16 NOTE — H&P (Addendum)
Physical Medicine and Rehabilitation Admission H&P     CC: Functional deficits secondary to left femur fracture   HPI: Dylan Bruce is a 77 year old male who fell and initial x-rays at [facility] negative for fracture. Returned to ED on 8/03 and CT revealed intertrochanteric left hip fracture. Also, imaging significant for left rib fracture. Transferred to [facility] for definitive care. Underwent left IM nail by Dr [name] on 8/04. History of PAF: has consistently been in 4:1 atrial flutter. Normal LVEF. WBAT. Tolerating diet. WOC RN consulted for sacral pressure injury and venous stasis ulcer. Has chronic pain (see below) and is upset the last day or two with how his pain is being managed and the length of time it takes to have his pain medication brought to him. Reports decreased strength and mobility in right shoulder.   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 S/p left shoulder arthroplasty. He has history of chronic back pain and takes approximately 4 Percocet 10/325 daily (Dr. Lindwood Qua), Lyrica 100 mg BID, tizanidine 4 mg at bedtime and cyclobenzaprine 10 mg at bedtime.   The patient requires inpatient physical medicine and rehabilitation evaluations and treatment secondary to dysfunction due to left femur fracture.     Pt reports took Percocet 10/325 mg- 2 tabs QID at home prior to fracture- feels like getting too low of dose here. Takes for shoulder, and chronic back pain- hx of back surgery/fusion, and spina bifida and frequent falls.    Has R frozen shoulder and psoriatic arthritis- due for Cosentyx which gets monthly- wondering if can get here-    Fell 3x in last 4 weeks and gouged L elbow every time. As well as L hip, R calf, etc.      Review of Systems  Constitutional:  Positive for malaise/fatigue and weight loss.  HENT: Negative.     Eyes: Negative.   Respiratory: Negative.  Negative for shortness of breath.        Pain from rib fractures  Cardiovascular:  Positive for leg swelling. Negative for chest pain, palpitations and orthopnea.  Gastrointestinal:  Negative for constipation, diarrhea and nausea.  Genitourinary: Negative.   Musculoskeletal:  Positive for back pain, falls, joint pain and myalgias.  Skin:        Skin tears, "gouges' out of skin  Neurological:  Positive for focal weakness.  Endo/Heme/Allergies:  Bruises/bleeds easily.  Psychiatric/Behavioral:  The patient has insomnia.   All other systems reviewed and are negative.       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 Clifton James and Dr. Delia Chimes   SLEEP APNEA     SPINA BIFIDA               Past Surgical History:  Procedure Laterality Date   BACK SURGERY       CARPAL TUNNEL RELEASE Left 08/21/2019    Procedure: CARPAL TUNNEL RELEASE;  Surgeon: Cindee Salt, MD;  Location: [facility];  Service: Orthopedics;  Laterality: Left;  AXILLARY BLOCK   CORONARY ARTERY BYPASS GRAFT       INTRAMEDULLARY (IM) NAIL INTERTROCHANTERIC Left 06/10/2023    Procedure: INTRAMEDULLARY (IM) NAIL INTERTROCHANTERIC;  Surgeon: Teryl Lucy, MD;  Location: [facility];  Service: Orthopedics;  Laterality: Left;   INTRAOPERATIVE TRANSTHORACIC ECHOCARDIOGRAM N/A 02/21/2023    Procedure: INTRAOPERATIVE TRANSTHORACIC ECHOCARDIOGRAM;  Surgeon: Kathleene Hazel, MD;  Location: [facility];  Service: Open Heart Surgery;  Laterality: N/A;   JOINT REPLACEMENT Left      shoulder   LEFT HEART CATH AND CORS/GRAFTS ANGIOGRAPHY N/A 06/18/2018    Procedure: LEFT HEART CATH AND  CORS/GRAFTS ANGIOGRAPHY;  Surgeon: Runell Gess, MD;  Location: [facility];  Service: Cardiovascular;  Laterality: N/A;   RIGHT/LEFT HEART CATH AND CORONARY/GRAFT ANGIOGRAPHY N/A 01/11/2023    Procedure: RIGHT/LEFT HEART CATH AND CORONARY/GRAFT ANGIOGRAPHY;  Surgeon: Kathleene Hazel, MD;  Location: [facility];  Service: Cardiovascular;  Laterality: N/A;   TRANSCATHETER AORTIC VALVE REPLACEMENT, TRANSFEMORAL Left 02/21/2023    Procedure: Transcatheter Aortic Valve Replacement, Transfemoral;  Surgeon: Kathleene Hazel, MD;  Location: [facility];  Service: Open Heart Surgery;  Laterality: Left;   TRIGGER FINGER RELEASE Left 08/21/2019    Procedure: RELEASE TRIGGER LEFT SMALL FINGER LEFT INDEX;  Surgeon: Cindee Salt, MD;  Location: [facility];  Service: Orthopedics;  Laterality: Left;   ULNAR NERVE TRANSPOSITION Left 08/21/2019    Procedure: DECOMPRESSION WITH ULNAR NERVE LEFT CUBITAL TUNNEL ULNAR;  Surgeon: Cindee Salt, MD;  Location:  [facility];  Service: Orthopedics;  Laterality: Left;             Family History  Problem Relation Age of Onset   Valvular heart disease Father     Prostate cancer Father          Social History:  reports that he has never smoked. He has never used smokeless tobacco. He reports that he does not currently use alcohol. He reports that he does not use drugs. Allergies:  Allergies       Allergies  Allergen Reactions   Cefuroxime Diarrhea and Nausea And Vomiting      Severe. Can take Ancef 2g IV on 02/21/2023 without reporting any issue   Ibuprofen Anaphylaxis and Shortness Of Breath            Iodinated Contrast Media Shortness Of Breath and Other (See Comments)      Pt developed burning of face and SOB after 02/06/2023 CT scan    Cefpodoxime Rash   Ciprofloxacin Rash   Doxycycline Rash   Sulfamethoxazole-Trimethoprim Rash   Fluocinolone Other (See Comments)      Unknown reaction    Probenecid Other (See Comments)      Unknown reaction   Allopurinol Rash   Azithromycin Rash   Sulfa Antibiotics Rash            Medications Prior to Admission  Medication Sig Dispense Refill   albuterol (PROVENTIL HFA;VENTOLIN HFA) 108 (90 Base) MCG/ACT inhaler Inhale 2 puffs into the lungs every 4 (four) hours as needed for wheezing.       [EXPIRED] amoxicillin-clavulanate (AUGMENTIN) 875-125 MG tablet Take 1 tablet by mouth 2 (two) times daily.       apixaban (ELIQUIS) 5 MG TABS tablet Take 1 tablet (5 mg total) by mouth 2 (two) times daily. 120 tablet 2   atorvastatin (LIPITOR) 80 MG tablet Take 1  tablet (80 mg total) by mouth daily. 30 tablet 0   budesonide-formoterol (SYMBICORT) 160-4.5 MCG/ACT inhaler Inhale 2 puffs into the lungs in the morning and at bedtime.       clobetasol cream (TEMOVATE) 0.05 % Apply 1 application topically daily as needed (rash).        clopidogrel (PLAVIX) 75 MG tablet Take 1 tablet (75 mg total) by mouth daily. 90 tablet 3   COSENTYX SENSOREADY, 300 MG, 150 MG/ML SOAJ Inject 300 mg into the skin every 30 (thirty) days.       cyclobenzaprine (FLEXERIL) 10 MG tablet Take 10 mg by mouth at bedtime.        desonide (DESOWEN) 0.05 % cream Apply 1 application topically daily as needed (rash).        docusate sodium (COLACE) 100 MG capsule Take 100 mg by mouth daily.        DOXYCYCLINE CALCIUM PO Take by mouth in the morning and at bedtime.       esomeprazole (NEXIUM) 40 MG capsule Take 40 mg by mouth daily before breakfast.       ezetimibe (ZETIA) 10 MG tablet Take 10 mg by mouth daily.       fenofibrate (TRICOR) 145 MG tablet Take 145 mg by mouth daily.       fluticasone (FLONASE) 50 MCG/ACT nasal spray Place 1 spray into both nostrils in the morning and at bedtime.       hydrocortisone (ANUCORT-HC) 25 MG suppository Place 1 suppository rectally 2 (two) times daily as needed for hemorrhoids.       hydrOXYzine (VISTARIL) 25 MG capsule Take 25 mg by mouth daily  as needed for itching (sleep).       insulin lispro (HUMALOG KWIKPEN) 100 UNIT/ML KiwkPen Inject 18-24 Units into the skin 3 (three) times daily with meals.       isosorbide mononitrate (IMDUR) 30 MG 24 hr tablet Take 1 tablet (30 mg total) by mouth daily. Please schedule appointment for refills. 30 tablet 0   JARDIANCE 10 MG TABS tablet Take 10 mg by mouth daily.       LANTUS SOLOSTAR 100 UNIT/ML Solostar Pen Inject 22 Units into the skin at bedtime.   5   LYRICA 100 MG capsule Take 1 capsule (100 mg total) by mouth 2 (two) times daily.   5   magnesium oxide (MAG-OX) 400 (240 Mg) MG tablet Take 400 mg by mouth daily.       meclizine (ANTIVERT) 12.5 MG tablet Take 12.5 mg by mouth 3 (three) times daily as needed for dizziness.       metFORMIN (GLUCOPHAGE) 1000 MG tablet Take 1,000 mg by mouth 2 (two) times daily.       mirtazapine (REMERON) 30 MG tablet Take 1 tablet by mouth at bedtime.        nitroGLYCERIN (NITROSTAT) 0.4 MG SL tablet Place 1 tablet (0.4 mg total) under the tongue every 5 (five) minutes as needed for chest pain. (Patient taking differently: Place 0.4 mg under the tongue daily as needed (for difficulty breathing d/t fluid in lungs).) 90 tablet 3   nystatin cream (MYCOSTATIN) Apply 1 Application topically 2 (two) times daily.       oxyCODONE-acetaminophen (PERCOCET) 10-325 MG per tablet Take 1-2 tablets by mouth 2 (two) times daily as needed for pain.        Semaglutide, 1 MG/DOSE, (OZEMPIC, 1 MG/DOSE,) 4 MG/3ML SOPN Inject 4 mg into the skin once a week.  SOOLANTRA 1 % CREA Apply 1 application  topically daily.   3   tamsulosin (FLOMAX) 0.4 MG CAPS capsule Take 0.4 capsules by mouth daily.       tiZANidine (ZANAFLEX) 4 MG tablet Take 4 mg by mouth at bedtime.       torsemide (DEMADEX) 20 MG tablet Take 20 mg by mouth daily.       triamcinolone cream (KENALOG) 0.1 % Apply 1 Application topically 2 (two) times daily as needed (for itching).       ULORIC 40 MG tablet Take 1  tablet by mouth Daily.       VIIBRYD 40 MG TABS Take 40 mg by mouth Daily.        Torsemide 40 MG TABS Take 40 mg by mouth daily. (Patient not taking: Reported on 06/10/2023) 60 tablet 2              Home: Home Living Family/patient expects to be discharged to:: Private residence Living Arrangements: Spouse/significant other Available Help at Discharge: Family, Available 24 hours/day (wife, son and dtr) Type of Home: House Home Access: Ramped entrance Entrance Stairs-Number of Steps: ramp entrance Entrance Stairs-Rails: None Home Layout: One level Bathroom Shower/Tub: Engineer, manufacturing systems: Standard Bathroom Accessibility: Yes Home Equipment: Information systems manager  Lives With: Spouse   Functional History: Prior Function Prior Level of Function : Independent/Modified Independent Mobility Comments: Walks without assistive device ADLs Comments: independent   Functional Status:  Mobility: Bed Mobility Overal bed mobility: Needs Assistance Bed Mobility: Sit to Supine Supine to sit: Min assist Sit to supine: Mod assist Sit to sidelying: Mod assist General bed mobility comments: Mod A to help with LLE movement to bed, cues to use bed rails to assist with mobility Transfers Overall transfer level: Needs assistance Equipment used: Rolling walker (2 wheels) Transfers: Sit to/from Stand Sit to Stand: Mod assist Bed to/from chair/wheelchair/BSC transfer type:: Step pivot Step pivot transfers: Mod assist General transfer comment: Mod A with cues for anterior leaning, hand placeement, and bringing heels back, pt needing constant cueing. Ambulation/Gait Ambulation/Gait assistance: Min assist Gait Distance (Feet): 6 Feet Assistive device: Rolling walker (2 wheels) Gait Pattern/deviations: Step-to pattern, Antalgic, Decreased step length - right, Decreased stance time - left, Decreased stride length, Decreased weight shift to left General Gait Details: minA to RW and minA to  patient. small step-to pattern with limited wt on LLE Pre-gait activities: standing wt shift and standing marches 2 x 10   ADL: ADL Overall ADL's : Needs assistance/impaired Eating/Feeding: Modified independent, Sitting Grooming: Oral care, Set up, Sitting Upper Body Bathing: Set up, Sitting Lower Body Bathing: Maximal assistance, Sit to/from stand Upper Body Dressing : Minimal assistance, Sitting Upper Body Dressing Details (indicate cue type and reason): required assist to pull shirt down in the back and front while seated in recliner. Lower Body Dressing: Total assistance Lower Body Dressing Details (indicate cue type and reason): sitting in recliner. sock management General ADL Comments: OT providing assist with helping patient return to bed and eat dinner. Focused session on progressing functional mobility   Cognition: Cognition Overall Cognitive Status: Within Functional Limits for tasks assessed Orientation Level: Oriented X4 Cognition Arousal: Alert Behavior During Therapy: WFL for tasks assessed/performed Overall Cognitive Status: Within Functional Limits for tasks assessed Area of Impairment: Memory, Following commands, Problem solving Memory: Decreased short-term memory Following Commands: Follows one step commands with increased time Problem Solving: Requires verbal cues, Difficulty sequencing, Slow processing General Comments: no family present this session, pt  with improved command following and was able to follow most instructions on first attempt, may be close to baseline   Physical Exam: Blood pressure (!) 148/72, pulse 67, temperature 99.9 F (37.7 C), temperature source Oral, resp. rate 17, height 6\' 2"  (1.88 m), weight 90.2 kg, SpO2 97%. Physical Exam Vitals and nursing note reviewed.  Constitutional:      Comments: Appears younger than stated age, sitting up in bed; awake, alert, appropriate, NAD  HENT:     Head: Normocephalic and atraumatic.     Right Ear:  External ear normal.     Left Ear: External ear normal.     Nose: Nose normal. No congestion.     Mouth/Throat:     Mouth: Mucous membranes are moist.     Pharynx: Oropharynx is clear. No oropharyngeal exudate.  Eyes:     General:        Right eye: No discharge.        Left eye: No discharge.     Extraocular Movements: Extraocular movements intact.  Cardiovascular:     Rate and Rhythm: Normal rate and regular rhythm.     Heart sounds: Normal heart sounds. No murmur heard.    No gallop.  Pulmonary:     Effort: Pulmonary effort is normal. No respiratory distress.     Breath sounds: Normal breath sounds. No wheezing, rhonchi or rales.  Abdominal:     General: Bowel sounds are normal. There is no distension.     Palpations: Abdomen is soft.     Tenderness: There is no abdominal tenderness.  Musculoskeletal:     Cervical back: Tenderness present.     Comments: Psoriatic arthritis changes esp in hands DIPs are overflexed/contracted R>L and somewhat of MCPs somewhat flexed at rest- has hand contractures   R shoulder has ~ 20-25 degrees of active ROM max RUE- deltoid 2-/5; otherwise 4+/5 in RUE LUE shoulder has >90 degrees and 4+/5 in LUE RLE- HF proximally 3-/5 and sitally 4/5 LLE- HF 2-/5; KE 2/5; and DF/PF 4/5- limited severely by L hip pain  Skin:    General: Skin is warm and dry.     Comments: L elbow- gooey- and looks like hasn't been changed- a lot of brown/reddish goo under dressing Lot sof L hip bruising around dressing- doesn't have original dressing in place R shin- kerlex wrapped from today A lot of UE and LE ecchymoses Barrel chested Chest covered in syrup from meals?  Neurological:     Comments: Spina bifida changes to LE's sensation  Psychiatric:        Mood and Affect: Mood normal.        Behavior: Behavior normal.     Comments:  A little frustrated about not getting meds like home dose        Lab Results Last 48 Hours        Results for orders placed or  performed during the hospital encounter of 06/09/23 (from the past 48 hour(s))  Glucose, capillary     Status: Abnormal    Collection Time: 06/13/23  4:26 PM  Result Value Ref Range    Glucose-Capillary 219 (H) 70 - 99 mg/dL      Comment: Glucose reference range applies only to samples taken after fasting for at least 8 hours.  Glucose, capillary     Status: Abnormal    Collection Time: 06/13/23  8:30 PM  Result Value Ref Range    Glucose-Capillary 225 (H) 70 - 99 mg/dL  Comment: Glucose reference range applies only to samples taken after fasting for at least 8 hours.  Glucose, capillary     Status: Abnormal    Collection Time: 06/14/23  8:01 AM  Result Value Ref Range    Glucose-Capillary 245 (H) 70 - 99 mg/dL      Comment: Glucose reference range applies only to samples taken after fasting for at least 8 hours.    Comment 1 Notify RN    Glucose, capillary     Status: Abnormal    Collection Time: 06/14/23 12:45 PM  Result Value Ref Range    Glucose-Capillary 296 (H) 70 - 99 mg/dL      Comment: Glucose reference range applies only to samples taken after fasting for at least 8 hours.  Glucose, capillary     Status: Abnormal    Collection Time: 06/14/23  1:12 PM  Result Value Ref Range    Glucose-Capillary 352 (H) 70 - 99 mg/dL      Comment: Glucose reference range applies only to samples taken after fasting for at least 8 hours.  Glucose, capillary     Status: Abnormal    Collection Time: 06/14/23  4:05 PM  Result Value Ref Range    Glucose-Capillary 256 (H) 70 - 99 mg/dL      Comment: Glucose reference range applies only to samples taken after fasting for at least 8 hours.  Glucose, capillary     Status: Abnormal    Collection Time: 06/14/23  9:16 PM  Result Value Ref Range    Glucose-Capillary 345 (H) 70 - 99 mg/dL      Comment: Glucose reference range applies only to samples taken after fasting for at least 8 hours.  CBC     Status: Abnormal    Collection Time: 06/15/23   1:25 AM  Result Value Ref Range    WBC 11.2 (H) 4.0 - 10.5 K/uL    RBC 3.73 (L) 4.22 - 5.81 MIL/uL    Hemoglobin 9.5 (L) 13.0 - 17.0 g/dL    HCT 16.1 (L) 09.6 - 52.0 %    MCV 81.5 80.0 - 100.0 fL    MCH 25.5 (L) 26.0 - 34.0 pg    MCHC 31.3 30.0 - 36.0 g/dL    RDW 04.5 (H) 40.9 - 15.5 %    Platelets 337 150 - 400 K/uL    nRBC 0.0 0.0 - 0.2 %      Comment: Performed at [facility], [address] [place], [place] [address]  Basic metabolic panel     Status: Abnormal    Collection Time: 06/15/23  1:25 AM  Result Value Ref Range    Sodium 133 (L) 135 - 145 mmol/L    Potassium 4.9 3.5 - 5.1 mmol/L    Chloride 98 98 - 111 mmol/L    CO2 24 22 - 32 mmol/L    Glucose, Bld 340 (H) 70 - 99 mg/dL      Comment: Glucose reference range applies only to samples taken after fasting for at least 8 hours.    BUN 51 (H) 8 - 23 mg/dL    Creatinine, Ser 4.78 (H) 0.61 - 1.24 mg/dL    Calcium 8.6 (L) 8.9 - 10.3 mg/dL    GFR, Estimated 45 (L) >60 mL/min      Comment: (NOTE) Calculated using the CKD-EPI Creatinine Equation (2021)      Anion gap 11 5 - 15      Comment: Performed at [facility], [address], [place], [place] [address]  Glucose, capillary     Status: Abnormal    Collection Time: 06/15/23  7:55 AM  Result Value Ref Range    Glucose-Capillary 278 (H) 70 - 99 mg/dL      Comment: Glucose reference range applies only to samples taken after fasting for at least 8 hours.  Glucose, capillary     Status: Abnormal    Collection Time: 06/15/23 11:19 AM  Result Value Ref Range    Glucose-Capillary 233 (H) 70 - 99 mg/dL      Comment: Glucose reference range applies only to samples taken after fasting for at least 8 hours.  Glucose, capillary     Status: Abnormal    Collection Time: 06/15/23 12:02 PM  Result Value Ref Range    Glucose-Capillary 177 (H) 70 - 99 mg/dL      Comment: Glucose reference range applies only to samples taken after fasting for at least 8 hours.       Imaging Results (Last 48 hours)  No results found.         Blood pressure (!) 148/72, pulse 67, temperature 99.9 F (37.7 C), temperature source Oral, resp. rate 17, height 6\' 2"  (1.88 m), weight 90.2 kg, SpO2 97%.   Medical Problem List and Plan: 1. Functional deficits secondary to  L intertrochanteric hip fx s/p IM nail WBAT             -patient may  shower             -ELOS/Goals: 10-14 days supervision             Admit to CIR 2.  Antithrombotics: -DVT/anticoagulation:  Pharmaceutical: Eliquis             -antiplatelet therapy: Plavix    3. 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 Mood/Behavior/Sleep: LCSW to evaluate and provide emotional support             -antipsychotic agents: Vilazodone 40 mg daily             -continue Remeron 30 mg q HS             -continue Provigil 100 mg daily     5. Neuropsych/cognition: This patient is capable of making decisions on his own behalf.   6. Skin/Wound Care: Routine skin care checks             -sacral pressure injury; continue local care/pressure relief             -monitor surgical incision             L elbow and R lower leg wounds- local wound care 7. Fluids/Electrolytes/Nutrition: Routine Is and Os and follow-up chemistries             -continue Mag-ox 400 mg daily   8: Hypertension: monitor TID and prn -Continue Imdur 30 mg daily -continue Demadex 20 mg daily   9: Hyperlipidemia: continue statin, Zetia  10: DM-insulin dependent: CBGs QID; A1c = 8.5% on 02/20/2023 (Jardiance 10 mg, Metformin 1000 mg BID, Ozempic, Lantus and SSI with meals at home)             -continue SSI             -continue Novolog 12 units with meals              -continue Semglee 25 units BID   11: Venous stasis: RLE pre-tibial ulcer; continue local wound care   12: S/p left IM nail Dr. [name]             -weight bearing at tolerated   13: Sleep apnea: CPAP at 17 nightly   14: Left rib fractures: pain control and pulmonary toilet   15: Paroxysmal atrial fib: on Eliquis, Imdur, Demadex (rate controlled no other meds at home)   16: BPH: continue Flomax   17:Psoriasis/psoriatic arthritis: his family brought his Desonide 0.05% cream and clobetasol 0.05% cream to apply to hands, elbows, knees daily as needed             -If can get Cosentyx from home, can give to him- 2 weeks late, will make him hurt too much 18: Rosacea: his family brought in his Soolantra (ivermectin) 1% cream to apply to face daily as needed   19: s/p CABG 2023   20: Osteoarthritis: S/p left shoulder arthroplasty; needs the right shoulder done as well- has R frozen shoulder as a result.    21. 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    22. Spina bifida with frequent falls- will d/w therapy.      Milinda Antis, PA-C 06/15/2023   I spent a total of 87   minutes on total care today- >50% coordination of care- due to  Prolonged time with pt trying to figure out his meds and exam; also d/w pharmacy and trying to find nursing on floor for pain meds- spent 55 minutes on floor as well as review of chart, labs, vitals, etc, med usage; and typing up note.

## 2023-06-17 LAB — GLUCOSE, CAPILLARY
Glucose-Capillary: 220 mg/dL — ABNORMAL HIGH (ref 70–99)
Glucose-Capillary: 181 mg/dL — ABNORMAL HIGH (ref 70–99)
Glucose-Capillary: 170 mg/dL — ABNORMAL HIGH (ref 70–99)
Glucose-Capillary: 52 mg/dL — ABNORMAL LOW (ref 70–99)
Glucose-Capillary: 55 mg/dL — ABNORMAL LOW (ref 70–99)
Glucose-Capillary: 59 mg/dL — ABNORMAL LOW (ref 70–99)
Glucose-Capillary: 87 mg/dL (ref 70–99)

## 2023-06-17 MED ORDER — DEXTROSE 50 % IV SOLN
INTRAVENOUS | Status: AC
  Administered 2023-06-17: 25 mL
  Filled 2023-06-17: qty 50

## 2023-06-17 MED ORDER — DESONIDE 0.05 % EX CREA
1.0000 | TOPICAL_CREAM | Freq: Two times a day (BID) | CUTANEOUS | Status: DC | PRN
  Filled 2023-06-17: qty 1

## 2023-06-17 MED ORDER — CLOBETASOL PROPIONATE 0.05 % EX CREA: TOPICAL_CREAM | Freq: Two times a day (BID) | CUTANEOUS | Status: DC | PRN

## 2023-06-17 MED ORDER — INSULIN GLARGINE-YFGN 100 UNIT/ML ~~LOC~~ SOLN
28.0000 [IU] | Freq: Two times a day (BID) | SUBCUTANEOUS | Status: DC
  Administered 2023-06-17: 28 [IU] via SUBCUTANEOUS
  Filled 2023-06-17 (×3): qty 0.28

## 2023-06-17 MED ORDER — DEXTROSE 50 % IV SOLN: 12.5000 g | INTRAVENOUS | Status: AC

## 2023-06-17 MED ORDER — SODIUM CHLORIDE 0.9 % IV SOLN: INTRAVENOUS | Status: DC

## 2023-06-17 MED ORDER — IVERMECTIN 1 % EX CREA: 1.0000 | TOPICAL_CREAM | Freq: Every day | CUTANEOUS | Status: DC | PRN

## 2023-06-17 NOTE — Progress Notes (Signed)
Hypoglycemic Event  CBG: 55   Treatment: 8 oz juice/soda,   Symptoms: Shaky  Follow-up CBG: Time:21:10 CBG Result:52   Possible Reasons for Event: medication regimen  Comments/MD notified:[address] notified, 25 ML Dextrose 50 ordered, follow up CBG 20 minutes after giving.     Melvenia Beam

## 2023-06-17 NOTE — Progress Notes (Signed)
Nursing called regarding hypoglycemic event tonight. Sugars in the 50s, given multiple foods/beverages to try to increase it and still in the 50s. Ordered 1/2amp d50 and up to 87. Advised to give patient another ensure since that'll give some protein.   Patient receives 12u TID AC of novolog plus SSI, and was on 25u semglee BID but tonight he was increased to 28U (8pm dose being the first time).   Morning rounding team might need to review his AM fasting CBG before administration of the morning semglee dose.   Of note, minimal options at night for snacks with protein -- but if there is an HS order for snacks, he'll receive a sandwich for the fridge. This may be good to have available if this happens again. Defer to weekday team on this.   [employer]  PA-C  06/17/23 2251

## 2023-06-17 NOTE — Plan of Care (Signed)
  Problem: RH Eating Goal: LTG Patient will perform eating w/assist, cues/equip (OT) Description: LTG: Patient will perform eating with assist, with/without cues using equipment (OT) Flowsheets (Taken 06/17/2023 1235) LTG: Pt will perform eating with assistance level of: Set up assist    Problem: RH Grooming Goal: LTG Patient will perform grooming w/assist,cues/equip (OT) Description: LTG: Patient will perform grooming with assist, with/without cues using equipment (OT) Flowsheets (Taken 06/17/2023 1235) LTG: Pt will perform grooming with assistance level of: Set up assist    Problem: RH Bathing Goal: LTG Patient will bathe all body parts with assist levels (OT) Description: LTG: Patient will bathe all body parts with assist levels (OT) Flowsheets (Taken 06/17/2023 1235) LTG: Pt will perform bathing with assistance level/cueing: Supervision/Verbal cueing   Problem: RH Dressing Goal: LTG Patient will perform upper body dressing (OT) Description: LTG Patient will perform upper body dressing with assist, with/without cues (OT). Flowsheets (Taken 06/17/2023 1235) LTG: Pt will perform upper body dressing with assistance level of: Supervision/Verbal cueing Goal: LTG Patient will perform lower body dressing w/assist (OT) Description: LTG: Patient will perform lower body dressing with assist, with/without cues in positioning using equipment (OT) Flowsheets (Taken 06/17/2023 1235) LTG: Pt will perform lower body dressing with assistance level of: Supervision/Verbal cueing   Problem: RH Toileting Goal: LTG Patient will perform toileting task (3/3 steps) with assistance level (OT) Description: LTG: Patient will perform toileting task (3/3 steps) with assistance level (OT)  Flowsheets (Taken 06/17/2023 1235) LTG: Pt will perform toileting task (3/3 steps) with assistance level: Supervision/Verbal cueing   Problem: RH Toilet Transfers Goal: LTG Patient will perform toilet transfers w/assist  (OT) Description: LTG: Patient will perform toilet transfers with assist, with/without cues using equipment (OT) Flowsheets (Taken 06/17/2023 1235) LTG: Pt will perform toilet transfers with assistance level of: Supervision/Verbal cueing   Problem: RH Tub/Shower Transfers Goal: LTG Patient will perform tub/shower transfers w/assist (OT) Description: LTG: Patient will perform tub/shower transfers with assist, with/without cues using equipment (OT) Flowsheets (Taken 06/17/2023 1235) LTG: Pt will perform tub/shower stall transfers with assistance level of: Supervision/Verbal cueing

## 2023-06-17 NOTE — Progress Notes (Signed)
Hypoglycemic Event  CBG: 52  Treatment: 8 oz juice/soda, peanut butter, graham crackers  Symptoms: Shaky, pale  Follow-up CBG: Time:2147 CBG Result:59  Possible Reasons for Event: Medication regimen: Novolog or Semglee  Comments/MD notified:[address] notified, 25 ML Dextrose 50 ordered, follow up CBG 20 minutes after giving.     Melvenia Beam

## 2023-06-17 NOTE — Evaluation (Signed)
Physical Therapy Assessment and Plan  Patient Details  Name: Dylan Bruce MRN: [id number] Date of Birth: Feb 27, 1946  PT Diagnosis: Abnormal posture, Abnormality of gait, Difficulty walking, Edema, Impaired sensation, Low back pain, Muscle weakness, and Pain in joint Rehab Potential: Good ELOS: 14-21 days   Today's Date: 06/17/2023 PT Individual Time: 0705-0801 PT Individual Time Calculation (min): 56 min    Hospital Problem: Principal Problem:   Closed left hip fracture, initial encounter [facility]) Active Problems:   Spina bifida (HCC)   S/p left hip fracture   Past Medical History:  Past Medical History:  Diagnosis Date   Aortic stenosis    ARTHRITIS    ASTHMA    CAD (coronary artery disease)    CHF (congestive heart failure) (HCC)    DM    GERD    HYPERLIPIDEMIA    Hypertension    PSORIASIS    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 Clifton James and Dr. Delia Chimes   SLEEP APNEA    SPINA BIFIDA    Past Surgical History:  Past Surgical History:  Procedure Laterality Date   BACK SURGERY     CARPAL TUNNEL RELEASE Left 08/21/2019   Procedure: CARPAL TUNNEL RELEASE;  Surgeon: Cindee Salt, MD;  Location: [facility];  Service: Orthopedics;  Laterality: Left;  AXILLARY BLOCK   CORONARY ARTERY BYPASS GRAFT     INTRAMEDULLARY (IM) NAIL INTERTROCHANTERIC Left 06/10/2023   Procedure: INTRAMEDULLARY (IM) NAIL INTERTROCHANTERIC;  Surgeon: Teryl Lucy, MD;  Location: [facility] OR;  Service: Orthopedics;  Laterality: Left;   INTRAOPERATIVE TRANSTHORACIC ECHOCARDIOGRAM N/A 02/21/2023   Procedure: INTRAOPERATIVE TRANSTHORACIC ECHOCARDIOGRAM;  Surgeon: Kathleene Hazel, MD;  Location: [facility];  Service: Open Heart Surgery;  Laterality: N/A;   JOINT REPLACEMENT Left    shoulder   LEFT HEART CATH AND CORS/GRAFTS ANGIOGRAPHY N/A 06/18/2018   Procedure: LEFT HEART CATH AND CORS/GRAFTS ANGIOGRAPHY;  Surgeon: Runell Gess, MD;  Location: [facility];  Service: Cardiovascular;  Laterality: N/A;   RIGHT/LEFT HEART CATH AND CORONARY/GRAFT ANGIOGRAPHY N/A 01/11/2023   Procedure: RIGHT/LEFT HEART CATH AND CORONARY/GRAFT ANGIOGRAPHY;  Surgeon: Kathleene Hazel, MD;  Location: [facility];  Service: Cardiovascular;  Laterality: N/A;   TRANSCATHETER AORTIC VALVE REPLACEMENT, TRANSFEMORAL Left 02/21/2023   Procedure: Transcatheter Aortic Valve Replacement, Transfemoral;  Surgeon: Kathleene Hazel, MD;  Location: [facility];  Service: Open Heart Surgery;  Laterality: Left;   TRIGGER FINGER RELEASE Left 08/21/2019   Procedure: RELEASE TRIGGER LEFT SMALL FINGER LEFT INDEX;  Surgeon: Cindee Salt, MD;  Location: [facility];  Service: Orthopedics;  Laterality: Left;   ULNAR NERVE TRANSPOSITION Left 08/21/2019   Procedure: DECOMPRESSION WITH ULNAR NERVE LEFT CUBITAL TUNNEL ULNAR;  Surgeon: Cindee Salt, MD;  Location: [facility];  Service: Orthopedics;  Laterality: Left;    Assessment & Plan Clinical Impression: Patient is a 77 yo male with history of TAVR, DM,stage IIIa CKD HTN and spina bifida. Presented on 06/08/23 to [facility] ED after a fall following his first Outpatient therapy session. Recent history of frequent falls. Sent home and returned to [facility] ED 8/3.   CT head no acute issues, xray showed left rib fracture. CT of hip confirmed intertrochanteric left hip fracture. Orthopedic accepted for transfer on 8/3 to [facility] for surgical intervention. Underwent IM nailing by Dr [name] on 8/4.    Eliquis for VTE prophylaxis.  Pain control, augmented dosing based on opoid tolerance/dependence prior to admit. Continue home Lyrica. WBAT.    Has RLE leg wound in presence of venous insufficiency. Initially given antibiotics, now discontinued. Need podiatry follow up. Has been in 4:1 atrial flutter. Resumed Eliquis. Discussed with patient and  son. Will continue DOAC for now. No rate control agents on PTA med list, to monitor HR. Some atypical chest pain on 8/6. ECG without ST elevations and Troponin reassuring 23>22. Felt to be postoperative flatulence and improved with Maalox. Continue home Imdur and prn Maalox. Stage IIIA CKD . Stopped empagliflozin. CR stable. Continue torsemide and monitor. Continue glargine, and Novolog and resistant SSI. Hgb A1c 8.5. Tamsulosin for BPH. Statin and Zetia for HLD. Some concern for sleep disorder, known OSA. Began trial of Modafinil. Needs referral to Neurology . Restrict driving. Continue CPAP at HS.   Patient transferred to CIR on 06/16/2023 .   Patient currently requires total with mobility secondary to muscle weakness and muscle joint tightness, decreased cardiorespiratoy endurance, unbalanced muscle activation and decreased motor planning, and decreased sitting balance, decreased standing balance, decreased postural control, and decreased balance strategies.  Prior to hospitalization, patient was independent  with mobility and lived with Spouse in a House home.  Home access is ramp entranceRamped entrance.  Patient will benefit from skilled PT intervention to maximize safe functional mobility, minimize fall risk, and decrease caregiver burden for planned discharge home with 24 hour supervision.  Anticipate patient will benefit from follow up HH at discharge.  PT - End of Session Activity Tolerance: Decreased this session Endurance Deficit: Yes PT Assessment Rehab Potential (ACUTE/IP ONLY): Good PT Barriers to Discharge: Decreased caregiver support PT Barriers to Discharge Comments: wife unable to provide any assitance per report PT Patient demonstrates impairments in the following area(s): Balance;Edema;Endurance;Motor;Pain;Safety;Sensory;Skin Integrity PT Transfers Functional Problem(s): Bed to Chair;Bed Mobility;Car;Furniture PT Locomotion Functional Problem(s): Ambulation;Wheelchair  Mobility;Stairs PT Plan PT Intensity: Minimum of 1-2 x/day ,45 to 90 minutes PT Frequency: 5 out of 7 days PT Duration Estimated Length of Stay: 14-21 days PT Treatment/Interventions: Ambulation/gait training;Balance/vestibular training;Cognitive remediation/compensation;Community reintegration;Discharge planning;DME/adaptive equipment instruction;Disease management/prevention;Functional mobility training;Neuromuscular re-education;Pain management;Patient/family education;Psychosocial support;Skin care/wound management;Splinting/orthotics;Stair training;Therapeutic Activities;Therapeutic Exercise;UE/LE Strength taining/ROM;UE/LE Coordination activities;Wheelchair propulsion/positioning PT Transfers Anticipated Outcome(s): supervision basic transfers; min assist car PT Locomotion Anticipated Outcome(s): supervision household gait distances; mod I w/c mobility PT Recommendation Recommendations for Other Services: Neuropsych consult Follow Up Recommendations: Home health PT;24 hour supervision/assistance Patient destination: Home Equipment Recommended: Rolling walker with 5" wheels;Wheelchair cushion (measurements);Wheelchair (measurements)   PT Evaluation Precautions/Restrictions Precautions Precautions: Fall Restrictions Weight Bearing Restrictions: No LLE Weight Bearing: Weight bearing as tolerated  Pain 8/10 pain in back - RN made aware and medication given during start of session.  Pain Interference Pain Interference Pain Effect on Sleep: 1. Rarely or not at all Pain Interference with Therapy Activities: 1. Rarely or not at all Pain Interference with Day-to-Day Activities: 2. Occasionally Home Living/Prior Functioning Home Living Available Help at Discharge: Family;Available 24 hours/day (reports son adn daughter could stay) Type of Home: House Home Access: Ramped entrance Entrance Stairs-Number of Steps: ramp entrance Additional Comments: wife had a stroke 7 yrs ago; pt reports  unable to provide any assistance  Lives With: Spouse Prior Function Level of Independence: Independent with gait;Independent with transfers;Independent with basic ADLs  Able to Take Stairs?: Yes Driving: Yes Vision/Perception  Vision - History Baseline Vision: Wears glasses only for reading Perception Perception: Within Functional Limits Praxis Praxis: WFL  Cognition Overall Cognitive Status:  Impaired/Different from baseline Awareness: Impaired Problem Solving: Impaired Safety/Judgment: Impaired Sensation Sensation Light Touch: Impaired Detail Light Touch Impaired Details: Impaired LLE;Impaired RLE (chronic) Hot/Cold: Appears Intact Proprioception: Impaired by gross assessment Stereognosis: Not tested Coordination Gross Motor Movements are Fluid and Coordinated: No Fine Motor Movements are Fluid and Coordinated: No Coordination and Movement Description: BUE uncoordinated 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 and impaired DIP d/t psoriatic arthritis contractures Motor  Motor Motor: Abnormal postural alignment and control;Other (comment) Motor - Skilled Clinical Observations: BUE movement impaired 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 and impaired DIP d/t psoriatic arthritis contractures   Trunk/Postural Assessment  Cervical Assessment Cervical Assessment: Exceptions to [facility] (forward head, decreased rotation) Thoracic Assessment Thoracic Assessment: Exceptions to [facility] (flexed posture, decreased awareness, flexed to the R) Lumbar Assessment Lumbar Assessment: Exceptions to [facility] (posterior tilt, decreased mobility in pelvis) Postural Control Postural Control: Deficits on evaluation Trunk Control: impaired and poor in unsupported sitting Righting Reactions: delayed and inadequeate Protective Responses: delayed and inadequate  Balance Balance Balance Assessed: Yes Static Sitting Balance Static Sitting - Balance Support: Feet supported;Bilateral  upper extremity supported Static Sitting - Level of Assistance: 1: +1 Total assist;5: Stand by assistance (significantly fluctuates) Dynamic Sitting Balance Dynamic Sitting - Balance Support: Feet supported;Bilateral upper extremity supported (in W/C) Dynamic Sitting - Level of Assistance: 4: Min assist;1: +1 Total assist;3: Mod assist;2: Max assist (fluctuates) Dynamic Sitting - Balance Activities: Reaching for objects;Forward lean/weight shifting Sitting balance - Comments: cues and assist for anterior lean Static Standing Balance Static Standing - Balance Support: Bilateral upper extremity supported Static Standing - Level of Assistance: 1: +2 Total assist Static Standing - Comment/# of Minutes: >1 min Dynamic Standing Balance Dynamic Standing - Balance Support: Bilateral upper extremity supported Dynamic Standing - Level of Assistance: 1: +2 Total assist Extremity Assessment  RUE Assessment RUE Assessment: Exceptions to [facility] Active Range of Motion (AROM) Comments: ~ 20-25 degrees shoulder ROM, WFL bicep General Strength Comments: 2-/5 d/t frozen shoulder, 4/5 distal LUE Assessment LUE Assessment: Exceptions to [facility] Active Range of Motion (AROM) Comments: shoulder 90 degrees, WFL distal General Strength Comments: 4/5 overall LUE RLE Assessment RLE Assessment: Exceptions to [facility] General Strength Comments: formal MMT not completed; grossly 3-5/ LLE Assessment LLE Assessment: Exceptions to [facility] General Strength Comments: formal MMT not completed. Grossly observed 2-/5 at hip, 3-/5 at knee - painful limiting  Care Tool Care Tool Bed Mobility Roll left and right activity   Roll left and right assist level: Maximal Assistance - Patient 25 - 49%    Sit to lying activity   Sit to lying assist level: Maximal Assistance - Patient 25 - 49%    Lying to sitting on side of bed activity   Lying to sitting on side of bed assist level: the ability to move from lying on the back to sitting on the  side of the bed with no back support.: 2 Helpers     Care Tool Transfers Sit to stand transfer   Sit to stand assist level: 2 Helpers    Chair/bed transfer   Chair/bed transfer assist level: 2 Software engineer transfer   Assist Level: 2 Web designer transfer activity did not occur: Safety/medical concerns        Care Tool Locomotion Ambulation Ambulation activity did not occur: Safety/medical concerns   Assistive device: Walker-rolling    Walk 10 feet activity Walk 10 feet activity did not occur:  Safety/medical concerns       Walk 50 feet with 2 turns activity Walk 50 feet with 2 turns activity did not occur: Safety/medical concerns      Walk 150 feet activity Walk 150 feet activity did not occur: Safety/medical concerns      Walk 10 feet on uneven surfaces activity Walk 10 feet on uneven surfaces activity did not occur: Safety/medical concerns      Stairs Stair activity did not occur: Safety/medical concerns        Walk up/down 1 step activity Walk up/down 1 step or curb (drop down) activity did not occur: Safety/medical concerns      Walk up/down 4 steps activity Walk up/down 4 steps activity did not occur: Safety/medical concerns      Walk up/down 12 steps activity Walk up/down 12 steps activity did not occur: Safety/medical concerns      Pick up small objects from floor   Pick up small object from the floor assist level: 2 helpers    Wheelchair Is the patient using a wheelchair?: Yes Type of Wheelchair: Manual   Wheelchair assist level: Dependent - Patient 0%    Wheel 50 feet with 2 turns activity   Assist Level: Dependent - Patient 0%  Wheel 150 feet activity   Assist Level: Dependent - Patient 0%    Refer to Care Plan for Long Term Goals  SHORT TERM GOAL WEEK 1 PT Short Term Goal 1 (Week 1): Pt will be able to perform bed mobility with mod assist PT Short Term Goal 2 (Week 1): Pt will be able to perform sit <> stands with mod  assist PT Short Term Goal 3 (Week 1): Pt will be able to perform bed <> w/c transfers with mod assist PT Short Term Goal 4 (Week 1): Pt will be able to gait x 25' with min assist  Recommendations for other services: Neuropsych  Skilled Therapeutic Intervention  Evaluation completed (see details above and below) with education on PT POC and goals and individual treatment initiated with focus on bed mobility retraining and transfers. Pt requires significant amount of time to come to EOB with max assist and ultimately then required a second person to assist with getting into full upright position EOB. Pt with R lateral lean, decreased trunk control and activation and pain limiting as well. Max assist needed for reciprocal scooting. With bed highly elevated, pt able to perform mod +2 sit > stand with RW and performed stand step transfer to the w/c again with significant amount of extra time. Once in w/c, required max assist to reposition and several attempts. Elevated RLE on pillow per pt request for comfort. Pt was able to tolerate LLE in regular legrest without issue. Will benefit from elevating legrest bilaterally due to height/length of legs.   Mobility Bed Mobility Bed Mobility: Rolling Left;Supine to Sit Rolling Right: Total Assistance - Patient < 25% Rolling Left: Maximal Assistance - Patient 25-49% Supine to Sit: 2 Helpers Transfers Transfers: Sit to Stand;Stand to [employer] Transfers Sit to Stand: 2 Helpers Stand to Sit: 2 Counsellor Transfers: 2 Advertising account planner (Assistive device): Science writer / Additional Locomotion Stairs: No Naval architect Mobility: No (unable to assess due to pain)   Discharge Criteria: Patient will be discharged from PT if patient refuses treatment 3 consecutive times without medical reason, if treatment goals not met, if there is a change in medical status, if patient makes no progress towards goals or if  patient is discharged from hospital.  The above assessment, treatment plan, treatment alternatives and goals were discussed and mutually agreed upon: by patient  Delorise Royals, PT, DPT, CBIS  06/17/2023, 2:04 PM

## 2023-06-17 NOTE — Progress Notes (Signed)
Signed     Expand All Collapse All          Physical Medicine and Rehabilitation Consult Reason for Consult:Closed left hip fracture Referring Physician: Hazeline Junker, MD     HPI: Dylan Bruce is a 77 y.o. male with a history of AS s/p TAVR, type 2 DM, HTN, frequent falls, who was transferred from [facility] on 8/3 to [facility] for orthopedic eval. He had fallen at a PT session and was found  to have a left rib fracture initially, and returned the next day to their ED with hip pain and was found to have an intertrochanteric left hip fracture for which he requested admission to [facility]. He has IM nail placement on 8/4 by Dr. [name]. Physical Medicine & Rehabilitation was consulted to assess candidacy for CIR.       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 Clifton James and Dr. Delia Chimes   SLEEP APNEA     SPINA BIFIDA               Past Surgical History:  Procedure Laterality Date   BACK SURGERY       CARPAL TUNNEL RELEASE Left 08/21/2019    Procedure: CARPAL TUNNEL RELEASE;  Surgeon: Cindee Salt, MD;  Location: [facility];  Service: Orthopedics;  Laterality: Left;  AXILLARY BLOCK   CORONARY ARTERY BYPASS GRAFT       INTRAMEDULLARY (IM) NAIL INTERTROCHANTERIC Left 06/10/2023    Procedure: INTRAMEDULLARY (IM) NAIL INTERTROCHANTERIC;  Surgeon: Teryl Lucy, MD;  Location: [facility] OR;  Service: Orthopedics;  Laterality: Left;   INTRAOPERATIVE TRANSTHORACIC ECHOCARDIOGRAM N/A 02/21/2023    Procedure: INTRAOPERATIVE TRANSTHORACIC ECHOCARDIOGRAM;  Surgeon: Kathleene Hazel, MD;  Location: [facility];  Service: Open Heart Surgery;  Laterality: N/A;   JOINT REPLACEMENT Left       shoulder   LEFT HEART CATH AND CORS/GRAFTS ANGIOGRAPHY N/A 06/18/2018    Procedure: LEFT HEART CATH AND CORS/GRAFTS ANGIOGRAPHY;  Surgeon: Runell Gess, MD;  Location: [facility];  Service: Cardiovascular;  Laterality: N/A;   RIGHT/LEFT HEART CATH AND CORONARY/GRAFT ANGIOGRAPHY N/A 01/11/2023    Procedure: RIGHT/LEFT HEART CATH AND CORONARY/GRAFT ANGIOGRAPHY;  Surgeon: Kathleene Hazel, MD;  Location: [facility];  Service: Cardiovascular;  Laterality: N/A;   TRANSCATHETER AORTIC VALVE REPLACEMENT, TRANSFEMORAL Left 02/21/2023    Procedure: Transcatheter Aortic Valve Replacement, Transfemoral;  Surgeon: Kathleene Hazel, MD;  Location: [facility];  Service: Open Heart Surgery;  Laterality: Left;   TRIGGER FINGER RELEASE Left 08/21/2019    Procedure: RELEASE TRIGGER LEFT SMALL FINGER LEFT INDEX;  Surgeon: Cindee Salt, MD;  Location: [facility];  Service: Orthopedics;  Laterality: Left;   ULNAR NERVE TRANSPOSITION Left 08/21/2019    Procedure: DECOMPRESSION WITH ULNAR NERVE LEFT CUBITAL TUNNEL ULNAR;  Surgeon: Cindee Salt, MD;  Location: [facility];  Service: Orthopedics;  Laterality: Left;             Family History  Problem Relation Age of Onset   Valvular heart disease Father     Prostate cancer Father          Social History:  reports that he has never smoked. He has never used smokeless tobacco. He reports that he does not currently use alcohol. He reports that he does not use drugs. Allergies:  Allergies       Allergies  Allergen Reactions   Cefuroxime Diarrhea and Nausea And Vomiting      Severe. Can take Ancef 2g IV on 02/21/2023 without reporting any issue   Ibuprofen Anaphylaxis and Shortness Of Breath            Iodinated Contrast Media Shortness Of Breath and Other (See Comments)      Pt developed burning of face and SOB after 02/06/2023 CT scan    Cefpodoxime Rash   Ciprofloxacin Rash   Doxycycline  Rash   Sulfamethoxazole-Trimethoprim Rash   Fluocinolone Other (See Comments)      Unknown reaction   Probenecid Other (See Comments)      Unknown reaction   Allopurinol Rash   Azithromycin Rash   Sulfa Antibiotics Rash            Medications Prior to Admission  Medication Sig Dispense Refill   albuterol (PROVENTIL HFA;VENTOLIN HFA) 108 (90 Base) MCG/ACT inhaler Inhale 2 puffs into the lungs every 4 (four) hours as needed for wheezing.       [EXPIRED] amoxicillin-clavulanate (AUGMENTIN) 875-125 MG tablet Take 1 tablet by mouth 2 (two) times daily.       apixaban (ELIQUIS) 5 MG TABS tablet Take 1 tablet (5 mg total) by mouth 2 (two) times daily. 120 tablet 2   atorvastatin (LIPITOR) 80 MG tablet Take 1 tablet (80 mg total) by mouth daily. 30 tablet 0   budesonide-formoterol (SYMBICORT) 160-4.5 MCG/ACT inhaler Inhale 2 puffs into the lungs in the morning and at bedtime.       clobetasol cream (TEMOVATE) 0.05 % Apply 1 application topically daily as needed (rash).        clopidogrel (PLAVIX) 75 MG tablet Take 1 tablet (75 mg total) by mouth daily. 90 tablet 3   COSENTYX SENSOREADY, 300 MG, 150 MG/ML SOAJ Inject 300 mg into the skin every 30 (thirty) days.       cyclobenzaprine (FLEXERIL) 10 MG tablet Take 10 mg by mouth at bedtime.        desonide (DESOWEN) 0.05 % cream Apply 1 application topically daily as needed (rash).        docusate sodium (COLACE) 100 MG capsule Take 100 mg by mouth daily.        DOXYCYCLINE CALCIUM PO Take by mouth in the morning and at bedtime.       esomeprazole (NEXIUM) 40 MG capsule Take 40 mg by mouth daily before breakfast.       ezetimibe (ZETIA) 10 MG tablet Take 10 mg by mouth daily.       fenofibrate (TRICOR) 145 MG tablet Take 145 mg by mouth daily.       fluticasone (FLONASE) 50 MCG/ACT nasal spray Place 1 spray into both nostrils in the morning and at bedtime.       hydrocortisone (ANUCORT-HC) 25 MG suppository Place 1 suppository rectally 2 (two)  times daily as needed for hemorrhoids.       hydrOXYzine (VISTARIL) 25 MG capsule Take 25 mg by mouth daily as needed for itching (sleep).       insulin lispro (HUMALOG  KWIKPEN) 100 UNIT/ML KiwkPen Inject 18-24 Units into the skin 3 (three) times daily with meals.       isosorbide mononitrate (IMDUR) 30 MG 24 hr tablet Take 1 tablet (30 mg total) by mouth daily. Please schedule appointment for refills. 30 tablet 0   JARDIANCE 10 MG TABS tablet Take 10 mg by mouth daily.       LANTUS SOLOSTAR 100 UNIT/ML Solostar Pen Inject 22 Units into the skin at bedtime.   5   LYRICA 100 MG capsule Take 1 capsule (100 mg total) by mouth 2 (two) times daily.   5   magnesium oxide (MAG-OX) 400 (240 Mg) MG tablet Take 400 mg by mouth daily.       meclizine (ANTIVERT) 12.5 MG tablet Take 12.5 mg by mouth 3 (three) times daily as needed for dizziness.       metFORMIN (GLUCOPHAGE) 1000 MG tablet Take 1,000 mg by mouth 2 (two) times daily.       mirtazapine (REMERON) 30 MG tablet Take 1 tablet by mouth at bedtime.        nitroGLYCERIN (NITROSTAT) 0.4 MG SL tablet Place 1 tablet (0.4 mg total) under the tongue every 5 (five) minutes as needed for chest pain. (Patient taking differently: Place 0.4 mg under the tongue daily as needed (for difficulty breathing d/t fluid in lungs).) 90 tablet 3   nystatin cream (MYCOSTATIN) Apply 1 Application topically 2 (two) times daily.       oxyCODONE-acetaminophen (PERCOCET) 10-325 MG per tablet Take 1-2 tablets by mouth 2 (two) times daily as needed for pain.        Semaglutide, 1 MG/DOSE, (OZEMPIC, 1 MG/DOSE,) 4 MG/3ML SOPN Inject 4 mg into the skin once a week.       SOOLANTRA 1 % CREA Apply 1 application  topically daily.   3   tamsulosin (FLOMAX) 0.4 MG CAPS capsule Take 0.4 capsules by mouth daily.       tiZANidine (ZANAFLEX) 4 MG tablet Take 4 mg by mouth at bedtime.       torsemide (DEMADEX) 20 MG tablet Take 20 mg by mouth daily.       triamcinolone cream (KENALOG) 0.1 %  Apply 1 Application topically 2 (two) times daily as needed (for itching).       ULORIC 40 MG tablet Take 1 tablet by mouth Daily.       VIIBRYD 40 MG TABS Take 40 mg by mouth Daily.        Torsemide 40 MG TABS Take 40 mg by mouth daily. (Patient not taking: Reported on 06/10/2023) 60 tablet 2          Home: Home Living Family/patient expects to be discharged to:: Private residence Living Arrangements: Spouse/significant other Available Help at Discharge: Family Type of Home: House Home Access: Stairs to enter Secretary/administrator of Steps: 3 Entrance Stairs-Rails: None Home Layout: One level Bathroom Shower/Tub: Engineer, manufacturing systems: Standard Bathroom Accessibility: Yes Home Equipment: Banker History: Prior Function Prior Level of Function : Independent/Modified Independent Mobility Comments: Walks without assistive device ADLs Comments: independent Functional Status:  Mobility: Bed Mobility Overal bed mobility: Needs Assistance Bed Mobility: Supine to Sit Supine to sit: Mod assist Sit to sidelying: Mod assist General bed mobility comments: Mod assist for LLE management, and cues for technqiue; smoother progression Transfers Overall transfer level: Needs assistance Equipment used: Rolling walker (2 wheels) Transfers: Sit to/from Stand Sit to Stand: Mod assist Bed to/from chair/wheelchair/BSC transfer type::  Step pivot Step pivot transfers: Min assist General transfer comment: stood from elevated bed; mod assist to rise and steady; cues for hand placement Ambulation/Gait Ambulation/Gait assistance: Min assist Gait Distance (Feet): 9 Feet Assistive device: Rolling walker (2 wheels) Gait Pattern/deviations: Step-to pattern, Antalgic General Gait Details: Cues for sequence and to self-monitor for activity tolerance; very slow steps   ADL: ADL Overall ADL's : Needs assistance/impaired Eating/Feeding: Modified independent, Sitting Grooming:  Oral care, Set up, Sitting Upper Body Bathing: Set up, Sitting Lower Body Bathing: Maximal assistance, Sit to/from stand Upper Body Dressing : Minimal assistance, Sitting Upper Body Dressing Details (indicate cue type and reason): required assist to pull shirt down in the back and front while seated in recliner. Lower Body Dressing: Total assistance Lower Body Dressing Details (indicate cue type and reason): sitting in recliner. sock management   Cognition: Cognition Overall Cognitive Status: Impaired/Different from baseline Orientation Level: Oriented X4 Cognition Behavior During Therapy: WFL for tasks assessed/performed Overall Cognitive Status: Impaired/Different from baseline Area of Impairment: Memory, Following commands, Problem solving Memory: Decreased short-term memory Following Commands: Follows one step commands with increased time Problem Solving: Requires verbal cues, Difficulty sequencing, Slow processing General Comments: Overall pleasant and hard-working; notable that had to repeat instructions often during session   Blood pressure 129/70, pulse 70, temperature 97.8 F (36.6 C), temperature source Oral, resp. rate 17, height 6\' 2"  (1.88 m), weight 90.2 kg, SpO2 97%. Physical Exam Gen: no distress, normal appearing HEENT: oral mucosa pink and moist, NCAT Cardio: Reg rate Chest: normal effort, normal rate of breathing Abd: soft, non-distended Ext: no edema Psych: pleasant, normal affect Skin: incision dressing c/d/I with ecchymosis Neuro: Alert and oriented x3 Musculoskeletal: LLE strength limited by pain   Lab Results Last 24 Hours       Results for orders placed or performed during the hospital encounter of 06/09/23 (from the past 24 hour(s))  Glucose, capillary     Status: Abnormal    Collection Time: 06/12/23 12:13 PM  Result Value Ref Range    Glucose-Capillary 255 (H) 70 - 99 mg/dL  Glucose, capillary     Status: Abnormal    Collection Time: 06/12/23   4:36 PM  Result Value Ref Range    Glucose-Capillary 139 (H) 70 - 99 mg/dL  Troponin I (High Sensitivity)     Status: Abnormal    Collection Time: 06/12/23  5:41 PM  Result Value Ref Range    Troponin I (High Sensitivity) 23 (H) <18 ng/L  Troponin I (High Sensitivity)     Status: Abnormal    Collection Time: 06/12/23  8:02 PM  Result Value Ref Range    Troponin I (High Sensitivity) 22 (H) <18 ng/L  Glucose, capillary     Status: Abnormal    Collection Time: 06/12/23 10:22 PM  Result Value Ref Range    Glucose-Capillary 237 (H) 70 - 99 mg/dL  CBC     Status: Abnormal    Collection Time: 06/13/23 12:08 AM  Result Value Ref Range    WBC 9.4 4.0 - 10.5 K/uL    RBC 4.11 (L) 4.22 - 5.81 MIL/uL    Hemoglobin 10.2 (L) 13.0 - 17.0 g/dL    HCT 01.0 (L) 27.2 - 52.0 %    MCV 80.8 80.0 - 100.0 fL    MCH 24.8 (L) 26.0 - 34.0 pg    MCHC 30.7 30.0 - 36.0 g/dL    RDW 53.6 (H) 64.4 - 15.5 %    Platelets 299 150 -  400 K/uL    nRBC 0.0 0.0 - 0.2 %  Basic metabolic panel     Status: Abnormal    Collection Time: 06/13/23 12:08 AM  Result Value Ref Range    Sodium 135 135 - 145 mmol/L    Potassium 4.3 3.5 - 5.1 mmol/L    Chloride 102 98 - 111 mmol/L    CO2 24 22 - 32 mmol/L    Glucose, Bld 287 (H) 70 - 99 mg/dL    BUN 39 (H) 8 - 23 mg/dL    Creatinine, Ser 1.61 (H) 0.61 - 1.24 mg/dL    Calcium 8.4 (L) 8.9 - 10.3 mg/dL    GFR, Estimated 54 (L) >60 mL/min    Anion gap 9 5 - 15  Glucose, capillary     Status: Abnormal    Collection Time: 06/13/23  7:51 AM  Result Value Ref Range    Glucose-Capillary 279 (H) 70 - 99 mg/dL       Imaging Results (Last 48 hours)  VAS [place] ABI WITH/WO TBI   Result Date: 06/11/2023  LOWER EXTREMITY DOPPLER STUDY Patient Name:  MYRTLE PITCOCK  Date of Exam:   06/11/2023 Medical Rec #: [id number]       Accession #:    [id number] Date of Birth: 11-Oct-1946       Patient Gender: M Patient Age:   32 years Exam Location:  [facility] Procedure:      VAS [place] ABI WITH/WO  TBI Referring Phys: Janine Ores --------------------------------------------------------------------------------  Indications: Ulceration. High Risk Factors: Hypertension, hyperlipidemia, Diabetes, no history of                    smoking, coronary artery disease.  Comparison Study: Previous 09/03/18 Performing Technologist: McKayla Maag RVT, [place]  Examination Guidelines: A complete evaluation includes at minimum, Doppler waveform signals and systolic blood pressure reading at the level of bilateral brachial, anterior tibial, and posterior tibial arteries, when vessel segments are accessible. Bilateral testing is considered an integral part of a complete examination. Photoelectric Plethysmograph (PPG) waveforms and toe systolic pressure readings are included as required and additional duplex testing as needed. Limited examinations for reoccurring indications may be performed as noted.  ABI Findings: +---------+------------------+-----+---------+----------------+ Right    Rt Pressure (mmHg)IndexWaveform Comment          +---------+------------------+-----+---------+----------------+ Brachial 118                    triphasic                 +---------+------------------+-----+---------+----------------+ PTA      164               1.39 triphasic                 +---------+------------------+-----+---------+----------------+ DP       255               2.16 triphasicNon-compressible +---------+------------------+-----+---------+----------------+ Great Toe34                0.29 Abnormal                  +---------+------------------+-----+---------+----------------+ +---------+------------------+-----+---------+---------------------------------+ Left     Lt Pressure (mmHg)IndexWaveform Comment                           +---------+------------------+-----+---------+---------------------------------+ Brachial  triphasicNot pressure obtained due to IV                                             placement                         +---------+------------------+-----+---------+---------------------------------+ PTA      156               1.32 triphasic                                  +---------+------------------+-----+---------+---------------------------------+ DP       118               1.00 triphasic                                  +---------+------------------+-----+---------+---------------------------------+ Great Toe52                0.44 Abnormal                                   +---------+------------------+-----+---------+---------------------------------+ +-------+----------------+-----------+------------+----------------+ ABI/TBIToday's ABI     Today's TBIPrevious ABIPrevious TBI     +-------+----------------+-----------+------------+----------------+ Right  Non-compressible0.29       0.91        Non-compressible +-------+----------------+-----------+------------+----------------+ Left   1.32            0.44       1.01        Non-compressible +-------+----------------+-----------+------------+----------------+  Summary: Right: Resting right ankle-brachial index indicates noncompressible right lower extremity arteries. The right toe-brachial index is abnormal. Left: Resting left ankle-brachial index is within normal range. The left toe-brachial index is abnormal. *See table(s) above for measurements and observations.  Electronically signed by Gerarda Fraction on 06/11/2023 at 7:45:32 PM.    Final        Assessment/Plan: Diagnosis: Closed left hip fracture Does the need for close, 24 hr/day medical supervision in concert with the patient's rehab needs make it unreasonable for this patient to be served in a less intensive setting? 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 Yes Does the patient require coordinated care of a physician, rehab nurse, therapy disciplines of PT, OT to address physical and functional deficits in the context of the above medical diagnosis(es)? Yes Addressing deficits in the following areas: balance, endurance, locomotion, strength, transferring, bowel/bladder control, bathing, dressing, feeding, grooming, toileting, and psychosocial support Can the patient actively participate in an intensive therapy program of at least 3 hrs of therapy per day at least 5 days per week? Yes The potential for patient to make measurable gains while on inpatient rehab is excellent Anticipated functional outcomes upon discharge from inpatient rehab are supervision with PT and OT Estimated rehab length of stay to reach the above functional goals  is: 10-14 days Anticipated discharge destination: Home Overall Rehab/Functional Prognosis: excellent   POST ACUTE RECOMMENDATIONS: This patient's condition is appropriate for continued rehabilitative care in the following setting: CIR Patient has agreed to participate in recommended program. Yes Note that insurance prior authorization may be required for reimbursement for recommended care.       I have personally performed a face to face diagnostic evaluation of this patient. Additionally, I have examined the patient's medical record including any pertinent labs and radiographic images. If the physician assistant has documented in this note, I have reviewed and edited or otherwise concur with the physician assistant's documentation.   Thanks,   Horton Chin,  MD 06/13/2023

## 2023-06-17 NOTE — Progress Notes (Signed)
Dylan Rouge, MD  Physician Physical Medicine and Rehabilitation   PMR Pre-admission    Signed   Date of Service: 06/14/2023  3:18 PM  Related encounter: Admission (Discharged) from 06/09/2023 in [facility]TH  SURGICAL   Signed     Expand All Collapse All  Show:Clear all [x] Written[x] Templated[x] Copied  Added by: [x] Standley Brooking, RN  [] Hover for details PMR Admission Coordinator Pre-Admission Assessment   Patient: Dylan Bruce is an 77 y.o., male MRN: [id number] DOB: April 09, 1946 Height: 6\' 2"  (188 cm) Weight: 90.2 kg                                                                                                                                               Insurance Information HMO:     PPO: yes     PCP:      IPA:      80/20:      OTHER:  PRIMARY: UMR/[employer] Choice Plus      Policy#: [id number]      Subscriber: pt CM Name: Layla Maw      Phone#: [phone]   Fax#: [phone] Pre-Cert#: [id number]  approved for 7 days 8/10 until 8/17 f/u with Kathlen Brunswick phone [phone] fax [phone]    Employer: wife; [employer] Benefits:  Phone #: [phone]     Name: 8/8 Eff. Date: 11/06/22     Deduct: $150      Out of Pocket Max: $3000      Life Max: none  CIR: 80%      SNF: 80% Outpatient: 80%     Co-Pay: 20% Home Health: 80%      Co-Pay: 20% DME: 80%     Co-Pay: 20% Providers: in network  SECONDARY: Medicare a and b    Policy#: [id number]      passport one source active a 01/04/2013 and b 11/06/2013 checked 8/8   Financial Counselor:       Phone#:    The "Data Collection Information Summary" for patients in Inpatient Rehabilitation Facilities with attached "Privacy Act Statement-Health Care Records" was provided and verbally reviewed with: Patient and Family   Emergency Contact Information Contact Information       Name Relation Home Work Mobile    [place] Spouse [phone]   [phone]         Other Contacts        Name Relation Home Work Mobile    [place] Daughter     [phone]    [place], [place]     [phone]         Current Medical History  Patient Admitting Diagnosis: Hip fracture   History of Present Illness: 77 yo male with history of TAVR, DM,stage IIIa CKD HTN and spina bifida. Presented on 06/08/23 to [facility] ED after a fall following his first Outpatient therapy session. Recent history  of frequent falls. Sent home and returned to [facility] ED 8/3.   CT head no acute issues, xray showed left rib fracture. CT of hip confirmed intertrochanteric left hip fracture. Orthopedic accepted for transfer on 8/3 to [facility] for surgical intervention. Underwent IM nailing by Dr [name] on 8/4.    Eliquis for VTE prophylaxis. Pain control, augmented dosing based on opoid tolerance/dependence prior to admit. Continue home Lyrica. WBAT.    Has RLE leg wound in presence of venous insufficiency. Initially given antibiotics, now discontinued. Need podiatry follow up. Has been in 4:1 atrial flutter. Resumed Eliquis. Discussed with patient and son. Will continue DOAC for now. No rate control agents on PTA med list, to monitor HR. Some atypical chest pain on 8/6. ECG without ST elevations and Troponin reassuring 23>22. Felt to be postoperative flatulence and improved with Maalox. Continue home Imdur and prn Maalox. Stage IIIA CKD . Stopped empagliflozin. CR stable. Continue torsemide and monitor. Continue glargine, and Novolog and resistant SSI. Hgb A1c 8.5. Tamsulosin for BPH. Statin and Zetia for HLD. Some concern for sleep disorder, known OSA. Began trial of Modafinil. Needs referral to Neurology . Restrict driving. Continue CPAP at HS.    Patient's medical record from [facility] has been reviewed by the rehabilitation admission coordinator and physician.   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 Clifton James and Dr. Delia Chimes   SLEEP APNEA     SPINA BIFIDA          Has the patient had major surgery during 100 days prior to admission? Yes   Family History  family history includes Prostate cancer in his father; Valvular heart disease in his father.   Current Medications   Current Medications    Current Facility-Administered Medications:    albuterol (PROVENTIL) (2.5 MG/3ML) 0.083% nebulizer solution 2.5 mg, 2.5 mg, Nebulization, Q3H PRN, [name], MD   alum & mag hydroxide-simeth (MAALOX/MYLANTA) 200-200-20 MG/5ML suspension 30 mL, 30 mL, Oral, Q4H PRN, Janine Ores K, PA-C, 30 mL at 06/13/23 1732   apixaban (ELIQUIS) tablet 5 mg, 5 mg, Oral, BID, Hazeline Junker B, MD, 5 mg at 06/15/23 1007   atorvastatin (LIPITOR) tablet 80 mg, 80 mg, Oral, Daily, Hazeline Junker B, MD, 80 mg at 06/15/23 1006   bisacodyl (DULCOLAX) suppository 10 mg, 10 mg, Rectal, Daily PRN, Manson Passey, Blaine K, PA-C   clopidogrel (PLAVIX) tablet 75 mg, 75 mg, Oral, Daily, Hazeline Junker B, MD, 75 mg at 06/15/23 1007   docusate sodium (COLACE) capsule 100 mg, 100 mg, Oral, BID, Brown, Blaine K, PA-C, 100 mg at 06/15/23 1008   ezetimibe (ZETIA) tablet 10 mg, 10 mg, Oral, Daily, Hazeline Junker B, MD, 10 mg at 06/15/23 1007   feeding supplement (GLUCERNA SHAKE) (GLUCERNA SHAKE) liquid 237 mL, 237 mL, Oral, TID BM, Hazeline Junker B, MD, 237 mL at 06/15/23 1350   ferrous sulfate tablet 325 mg, 325 mg, Oral, TID PC, Brown, Blaine K, PA-C, 325 mg at 06/15/23 1255  fluticasone (FLONASE) 50 MCG/ACT nasal spray 1 spray, 1 spray, Each Nare, Daily, [name], MD, 1 spray at 06/15/23 1011   hydrocerin (EUCERIN) cream, , Topical, BID, Luiz Iron, NP, Given at 06/15/23 1008   hydrOXYzine (ATARAX) tablet 25  mg, 25 mg, Oral, Daily PRN, Manson Passey, Blaine K, PA-C, 25 mg at 06/15/23 1205   insulin aspart (novoLOG) injection 0-20 Units, 0-20 Units, Subcutaneous, TID WC, [name], MD, 4 Units at 06/15/23 1203   insulin aspart (novoLOG) injection 0-5 Units, 0-5 Units, Subcutaneous, QHS, [name], MD, 4 Units at 06/14/23 2116   insulin aspart (novoLOG) injection 12 Units, 12 Units, Subcutaneous, TID WC, [name], MD, 12 Units at 06/15/23 1203   insulin glargine-yfgn (SEMGLEE) injection 25 Units, 25 Units, Subcutaneous, BID, Azucena Fallen, MD, 25 Units at 06/15/23 1345   isosorbide mononitrate (IMDUR) 24 hr tablet 30 mg, 30 mg, Oral, Daily, Hazeline Junker B, MD, 30 mg at 06/15/23 1005   magnesium citrate solution 1 Bottle, 1 Bottle, Oral, Once PRN, Manson Passey, Blaine K, PA-C   magnesium oxide (MAG-OX) tablet 400 mg, 400 mg, Oral, Daily, Hazeline Junker B, MD, 400 mg at 06/15/23 1005   menthol-cetylpyridinium (CEPACOL) lozenge 3 mg, 1 lozenge, Oral, PRN **OR** phenol (CHLORASEPTIC) mouth spray 1 spray, 1 spray, Mouth/Throat, PRN, Manson Passey, Blaine K, PA-C   mirtazapine (REMERON) tablet 30 mg, 30 mg, Oral, QHS, [name], MD, 30 mg at 06/14/23 2109   modafinil (PROVIGIL) tablet 100 mg, 100 mg, Oral, Daily, Raulkar, Drema Pry, MD, 100 mg at 06/15/23 0836   mometasone-formoterol (DULERA) 200-5 MCG/ACT inhaler 2 puff, 2 puff, Inhalation, BID, [name], MD, 2 puff at 06/15/23 0831   multivitamin with minerals tablet 1 tablet, 1 tablet, Oral, Daily, [name], MD, 1 tablet at 06/15/23 1006   nitroGLYCERIN (NITROSTAT) SL tablet 0.4 mg, 0.4 mg, Sublingual, Q5 min PRN, Hazeline Junker B, MD, 0.4 mg at 06/12/23 1717   ondansetron (ZOFRAN) tablet 4 mg, 4 mg, Oral, Q6H PRN **OR** ondansetron (ZOFRAN) injection 4 mg, 4 mg, Intravenous, Q6H PRN, Armida Sans, PA-C   Oral care mouth rinse, 15 mL, Mouth Rinse, PRN, Manson Passey, Blaine K, PA-C   oxyCODONE (Oxy IR/ROXICODONE) immediate release tablet 10-15 mg, 10-15 mg,  Oral, Q4H PRN, Manson Passey, Blaine K, PA-C, 15 mg at 06/15/23 1255   oxyCODONE (Oxy IR/ROXICODONE) immediate release tablet 5-10 mg, 5-10 mg, Oral, Q4H PRN, Manson Passey, Blaine K, PA-C, 10 mg at 06/12/23 1137   pantoprazole (PROTONIX) EC tablet 40 mg, 40 mg, Oral, Daily, Hazeline Junker B, MD, 40 mg at 06/15/23 1006   polyethylene glycol (MIRALAX / GLYCOLAX) packet 17 g, 17 g, Oral, Daily PRN, Manson Passey, Blaine K, PA-C, 17 g at 06/15/23 1430   pregabalin (LYRICA) capsule 100 mg, 100 mg, Oral, BID, Hazeline Junker B, MD, 100 mg at 06/15/23 1006   senna (SENOKOT) tablet 8.6 mg, 1 tablet, Oral, BID, Brown, Blaine K, PA-C, 8.6 mg at 06/15/23 1006   sodium chloride 0.9 % bolus 500 mL, 500 mL, Intravenous, Once PRN, [name], MD   tamsulosin [facility]) capsule 0.4 mg, 0.4 mg, Oral, Daily, Hazeline Junker B, MD, 0.4 mg at 06/15/23 1005   torsemide (DEMADEX) tablet 20 mg, 20 mg, Oral, Daily, Hazeline Junker B, MD, 20 mg at 06/15/23 1005   Vilazodone HCl TABS 40 mg, 40 mg, Oral, Daily, Hazeline Junker B, MD, 40 mg at 06/15/23 1006     Patients Current Diet:  Diet Order                  Diet regular Room service appropriate? Yes; Fluid consistency: Thin  Diet effective now                       Precautions / Restrictions Precautions Precautions: Fall Precaution Comments: watch closely; possible narcolepsy Restrictions Weight Bearing Restrictions: No LLE Weight Bearing: Weight bearing as tolerated    Has the patient had 2 or more falls or a fall with injury in the past year?Yes   Prior Activity Level Community (5-7x/wk): Mod I with cane   Prior Functional Level Prior Function Prior Level of Function : Independent/Modified Independent Mobility Comments: Walks without assistive device ADLs Comments: independent   Self Care: Did the patient need help bathing, dressing, using the toilet or eating?  Independent   Indoor Mobility: Did the patient need assistance with walking from room to room (with or without device)?  Independent   Stairs: Did the patient need assistance with internal or external stairs (with or without device)? Independent   Functional Cognition: Did the patient need help planning regular tasks such as shopping or remembering to take medications? Independent   Patient Information Are you of Hispanic, Latino/a,or Spanish origin?: A. No, not of Hispanic, Latino/a, or Spanish origin What is your race?: A. White Do you need or want an interpreter to communicate with a doctor or health care staff?: 0. No   Patient's Response To:  Health Literacy and Transportation Is the patient able to respond to health literacy and transportation needs?: Yes Health Literacy - How often do you need to have someone help you when you read instructions, pamphlets, or other written material from your doctor or pharmacy?: Never In the past 12 months, has lack of transportation kept you from medical appointments or from getting medications?: No In the past 12 months, has lack of transportation kept you from meetings, work, or from getting things needed for daily living?: No   Journalist, newspaper / Equipment Home Assistive Devices/Equipment: None Home Equipment: Shower seat   Prior Device Use: Indicate devices/aids used by the patient prior to current illness, exacerbation or injury?  cane   Current Functional Level Cognition   Overall Cognitive Status: Within Functional Limits for tasks assessed Orientation Level: Oriented X4 Following Commands: Follows one step commands with increased time General Comments: no family present this session, pt with improved command following and was able to follow most instructions on first attempt, may be close to baseline    Extremity Assessment (includes Sensation/Coordination)   Upper Extremity Assessment: Generalized weakness (limited strength d/t left rib fracture)  Lower Extremity Assessment: Generalized weakness RLE Deficits / Details: wound anterior lower leg  dressed LLE Deficits / Details: Painful and uncomfortable with movement; grossly decr AROM and strength, limited by pain post fx and surgical repair LLE Coordination: decreased gross motor     ADLs   Overall ADL's : Needs assistance/impaired Eating/Feeding: Modified independent, Sitting Grooming: Oral care, Set up, Sitting Upper Body Bathing: Set up, Sitting Lower Body Bathing: Maximal assistance, Sit to/from stand Upper Body Dressing : Minimal assistance, Sitting Upper Body Dressing Details (indicate cue type and reason): required assist to pull shirt down in the back and front while seated in recliner. Lower Body Dressing: Total assistance Lower Body Dressing Details (indicate cue type and reason): sitting in recliner. sock management General ADL Comments: OT providing assist with helping patient return to bed  and eat dinner. Focused session on progressing functional mobility     Mobility   Overal bed mobility: Needs Assistance Bed Mobility: Sit to Supine Supine to sit: Min assist Sit to supine: Mod assist Sit to sidelying: Mod assist General bed mobility comments: Mod A to help with LLE movement to bed, cues to use bed rails to assist with mobility     Transfers   Overall transfer level: Needs assistance Equipment used: Rolling walker (2 wheels) Transfers: Sit to/from Stand Sit to Stand: Mod assist Bed to/from chair/wheelchair/BSC transfer type:: Step pivot Step pivot transfers: Mod assist General transfer comment: Mod A with cues for anterior leaning, hand placeement, and bringing heels back, pt needing constant cueing.     Ambulation / Gait / Stairs / Wheelchair Mobility   Ambulation/Gait Ambulation/Gait assistance: Editor, commissioning (Feet): 6 Feet Assistive device: Rolling walker (2 wheels) Gait Pattern/deviations: Step-to pattern, Antalgic, Decreased step length - right, Decreased stance time - left, Decreased stride length, Decreased weight shift to left General  Gait Details: minA to RW and minA to patient. small step-to pattern with limited wt on LLE Pre-gait activities: standing wt shift and standing marches 2 x 10     Posture / Balance Dynamic Sitting Balance Sitting balance - Comments: Pt using bed rails to maintain sitting balance, cues to tighten core and focus on anterior lean Balance Overall balance assessment: Needs assistance Sitting-balance support: Feet supported, Bilateral upper extremity supported Sitting balance-Leahy Scale: Poor Sitting balance - Comments: Pt using bed rails to maintain sitting balance, cues to tighten core and focus on anterior lean Postural control: Right lateral lean Standing balance support: Bilateral upper extremity supported, During functional activity, Reliant on assistive device for balance Standing balance-Leahy Scale: Poor Standing balance comment: reliant on RW     Special needs/care consideration Hgb A1c 8.5 Fall precautions CPAP at [employer], [facility], RN  Registered Nurse WOC   Consult Note    Signed   Date of Service: 06/10/2023  7:26 PM    Signed       Show:Clear all [x] Written[x] Templated[] Copied   Added by: [x] McNichol, [facility], RN   [] Hover for details WOC Nurse Consult Note: Reason for Consult:Right LE pretibial full thickness wound in the presence of venous insufficiency, pre ulcerative callus to right foot Wound type:venous insufficiency, neuropathic Pressure Injury POA: N/A Measurement:Right LE wound to be measured by bedside RN with application of next dressing change today Wound bed:See also photo documentation uploaded to EMR by Provider, red, moist Drainage (amount, consistency, odor) light yellow exudate Periwound:hemosiderin staining Dressing procedure/placement/frequency: I have provided Nursing with guidance for the care of the right pretibial ulcer using a daily cleanse and dressing with antimicrobial nonadherent gauze (xeroform).    Recommendations: The  pre callus area on the right foot can be managed with a post acute referral to Podiatric Medicine. 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   Turning and repositioning to prevent sacral PI is an important element of the POC as is floatation of the heels to prevent PI to those areas.   WOC nursing team will not follow, but will remain available to this patient, the nursing and medical teams.  Please re-consult if needed.   Thank you for inviting [place] to participate in this patient's Plan of Care.   Ladona Mow, MSN, RN, CNS, GNP, CWOCN, CWON-AP, WOCNF, [employer] phone:  [phone]  [phone]                  possible narcolepsy. Began med trial on 8/8      Previous Home Environment  Living Arrangements: Spouse/significant other  Lives With: Spouse Available Help at Discharge: Family, Available 24 hours/day (wife, son and dtr) Type of Home: House Home Layout: One level Home Access: Ramped entrance Entrance Stairs-Rails: None Entrance Stairs-Number of Steps: ramp entrance Bathroom Shower/Tub: Associate Professor: Yes Home Care Services: Other (Comment) (was at his first OP therapy visit)   Discharge Living Setting Plans for Discharge Living Setting: Patient's home, Lives with (comment) (wife) Type of Home at Discharge: House Discharge Home Layout: One level Discharge Home Access: Ramped entrance Discharge Bathroom Shower/Tub: Tub/shower unit Discharge Bathroom Toilet: Standard Discharge Bathroom Accessibility: Yes How Accessible: Accessible via walker Does the patient have any problems obtaining your medications?: No   Social/Family/Support Systems Patient Roles: Spouse Contact Information: wife, Erskine Squibb Anticipated Caregiver: wife, son, dtr and possible hired Field seismologist Information: see contacts Ability/Limitations of Caregiver:  wife with recent wrist fx, works for [employer] Availability: 24/7 Discharge Plan Discussed with Primary Caregiver: Yes Is Caregiver In Agreement with Plan?: Yes Does Caregiver/Family have Issues with Lodging/Transportation while Pt is in Rehab?: No   Goals Patient/Family Goal for Rehab: supervision with PT and OT Expected length of stay: ELOS 10 to 14 days Additional Information: wife with recent wrist fx. to return to work soon; family to provide 24/7 Pt/Family Agrees to Admission and willing to participate: Yes Program Orientation Provided & Reviewed with Pt/Caregiver Including Roles  & Responsibilities: Yes   Decrease burden of Care through IP rehab admission: n/a   Possible need for SNF placement upon discharge:not anticipated   Patient Condition: This patient's medical and functional status has changed since the consult dated: 06/13/23 in which the Rehabilitation Physician determined and documented that the patient's condition is appropriate for intensive rehabilitative care in an inpatient rehabilitation facility. See "History of Present Illness" (above) for medical update. Functional changes are: overall mod assist. Patient's medical and functional status update has been discussed with the Rehabilitation physician and patient remains appropriate for inpatient rehabilitation. Will admit to inpatient rehab 06/16/23 when bed is available.   Preadmission Screen Completed By:  Clois Dupes, RN MSN 06/15/2023 3:29 PM ______________________________________________________________________   Discussed status with Dr. Berline Chough on 06/15/23 at 1500 and received approval for admission 06/16/23 when bed is available.   Admission Coordinator:  Clois Dupes, RN MSN time [id number] Date 06/15/23            Revision History

## 2023-06-17 NOTE — Evaluation (Signed)
Occupational Therapy Assessment and Plan  Patient Details  Name: Dylan Bruce MRN: [id number] Date of Birth: February 04, 1946  OT Diagnosis: lumbago (low back pain), muscle weakness (generalized), and pain in joint Rehab Potential: Rehab Potential (ACUTE ONLY): Good ELOS: 18-21 days   Today's Date: 06/17/2023 OT Individual Time: 4098-1191 OT Individual Time Calculation (min): 70 min     Hospital Problem: Principal Problem:   Closed left hip fracture, initial encounter (HCC) Active Problems:   Spina bifida (HCC)   S/p left hip fracture   Past Medical History:  Past Medical History:  Diagnosis Date   Aortic stenosis    ARTHRITIS    ASTHMA    CAD (coronary artery disease)    CHF (congestive heart failure) (HCC)    DM    GERD    HYPERLIPIDEMIA    Hypertension    PSORIASIS    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 Clifton Bruce and Dylan. Delia Bruce   SLEEP APNEA    SPINA BIFIDA    Past Surgical History:  Past Surgical History:  Procedure Laterality Date   BACK SURGERY     CARPAL TUNNEL RELEASE Left 08/21/2019   Procedure: CARPAL TUNNEL RELEASE;  Surgeon: Dylan Salt, MD;  Location: [facility];  Service: Orthopedics;  Laterality: Left;  AXILLARY BLOCK   CORONARY ARTERY BYPASS GRAFT     INTRAMEDULLARY (IM) NAIL INTERTROCHANTERIC Left 06/10/2023   Procedure: INTRAMEDULLARY (IM) NAIL INTERTROCHANTERIC;  Surgeon: Dylan Lucy, MD;  Location: [facility];  Service: Orthopedics;  Laterality: Left;   INTRAOPERATIVE TRANSTHORACIC ECHOCARDIOGRAM N/A 02/21/2023   Procedure: INTRAOPERATIVE TRANSTHORACIC ECHOCARDIOGRAM;  Surgeon: Dylan Hazel, MD;  Location: [facility];  Service: Open Heart Surgery;  Laterality: N/A;   JOINT REPLACEMENT Left    shoulder   LEFT HEART CATH AND CORS/GRAFTS ANGIOGRAPHY N/A 06/18/2018   Procedure: LEFT HEART CATH AND CORS/GRAFTS ANGIOGRAPHY;  Surgeon: Dylan Gess, MD;  Location: [facility];  Service: Cardiovascular;  Laterality: N/A;   RIGHT/LEFT HEART CATH AND CORONARY/GRAFT ANGIOGRAPHY N/A 01/11/2023   Procedure: RIGHT/LEFT HEART CATH AND CORONARY/GRAFT ANGIOGRAPHY;  Surgeon: Dylan Hazel, MD;  Location: [facility];  Service: Cardiovascular;  Laterality: N/A;   TRANSCATHETER AORTIC VALVE REPLACEMENT, TRANSFEMORAL Left 02/21/2023   Procedure: Transcatheter Aortic Valve Replacement, Transfemoral;  Surgeon: Dylan Hazel, MD;  Location: [facility];  Service: Open Heart Surgery;  Laterality: Left;   TRIGGER FINGER RELEASE Left 08/21/2019   Procedure: RELEASE TRIGGER LEFT SMALL FINGER LEFT INDEX;  Surgeon: Dylan Salt, MD;  Location: [facility];  Service: Orthopedics;  Laterality: Left;   ULNAR NERVE TRANSPOSITION Left 08/21/2019   Procedure: DECOMPRESSION WITH ULNAR NERVE LEFT CUBITAL TUNNEL ULNAR;  Surgeon: Dylan Salt, MD;  Location: [facility];  Service: Orthopedics;  Laterality: Left;    Assessment & Plan Clinical Impression: Dylan Bruce is a 77 year old male who fell and initial x-rays at [facility] negative for fracture. Returned to ED on 8/03 and CT revealed intertrochanteric left hip fracture. Also, imaging significant for left rib fracture. Transferred to [facility] for definitive care. Underwent left IM nail by Dylan Bruce on 8/04. History of PAF: has consistently been in 4:1 atrial flutter. Normal LVEF. WBAT. Tolerating diet. WOC RN consulted for sacral pressure injury and venous stasis ulcer. Has chronic pain (see below) and is upset the last day or two with how his pain is being  managed and the length of time it takes to have his pain medication brought to him. Reports decreased strength and mobility in right shoulder.   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 S/p left shoulder arthroplasty. He has history of chronic back pain and takes approximately 4 Percocet 10/325 daily (Dylan. Lindwood Bruce), Lyrica 100 mg BID, tizanidine 4 mg at bedtime and cyclobenzaprine 10 mg at bedtime.   The patient requires inpatient physical medicine and rehabilitation evaluations and treatment secondary to dysfunction due to left femur fracture.     Pt reports took Percocet 10/325 mg- 2 tabs QID at home prior to fracture- feels like getting too low of dose here. Takes for shoulder, and chronic back pain- hx of back surgery/fusion, and spina bifida and frequent falls.    Has R frozen shoulder and psoriatic arthritis- due for Cosentyx which gets monthly- wondering if can get here-    Fell 3x in last 4 weeks and gouged L elbow every time. As well as L hip, R calf, etc. .  Patient transferred to CIR on 06/16/2023 .    Patient currently requires total with basic self-care skills secondary to muscle weakness, decreased cardiorespiratoy endurance, and decreased sitting balance, decreased standing balance, and decreased balance strategies.  Prior to hospitalization, patient could complete ADLs with independent .  Patient will benefit from skilled intervention to decrease level of assist with basic self-care skills and increase independence with basic self-care skills prior to discharge home with care partner.  Anticipate patient will require intermittent supervision and follow up home health.  OT - End of Session Activity Tolerance: Tolerates 10 - 20 min activity with multiple rests Endurance Deficit: Yes OT Assessment Rehab Potential (ACUTE ONLY): Good OT Barriers to Discharge: Decreased caregiver support OT Patient demonstrates impairments in the following area(s): Balance;Endurance;Motor;Pain;Safety OT Basic ADL's Functional Problem(s): Eating;Grooming;Bathing;Dressing;Toileting OT Transfers Functional Problem(s): Toilet;Tub/Shower OT Plan OT Intensity: Minimum of 1-2  x/day, 45 to 90 minutes OT Frequency: 5 out of 7 days OT Duration/Estimated Length of Stay: 18-21 days OT Treatment/Interventions: Balance/vestibular training;Discharge planning;Pain management;Self Care/advanced ADL retraining;Therapeutic Activities;UE/LE Coordination activities;Functional mobility training;Patient/family education;Therapeutic Exercise;Community reintegration;DME/adaptive equipment instruction;Neuromuscular re-education;UE/LE Strength taining/ROM OT Self Feeding Anticipated Outcome(s): set up OT Basic Self-Care Anticipated Outcome(s): supervision overall OT Toileting Anticipated Outcome(s): supervision overall OT Bathroom Transfers Anticipated Outcome(s): supervision overall OT Recommendation Recommendations for Other Services: Therapeutic Recreation consult Therapeutic Recreation Interventions: Pet therapy;Outing/community reintergration Patient destination: Home Follow Up Recommendations: Home health OT Equipment Recommended: To be determined;3 in 1 bedside comode;Tub/shower bench   OT Evaluation Precautions/Restrictions  Precautions Precautions: Fall Restrictions Weight Bearing Restrictions: Yes LLE Weight Bearing: Weight bearing as tolerated General Chart Reviewed: Yes Family/Caregiver Present: No Pain Pain Assessment Pain Scale: 0-10 Pain Score: 6  Pain Type: Chronic pain;Surgical pain Pain Location: Back Home Living/Prior Functioning Home Living Available Help at Discharge: Family, Available 24 hours/day Type of Home: House Home Access: Ramped entrance Entrance Stairs-Number of Steps: ramp entrance Entrance Stairs-Rails: None Home Layout: One level Bathroom Shower/Tub: Engineer, manufacturing systems: Standard Bathroom Accessibility: Yes Additional Comments: (P) wife had a stroke 7 yrs ago; pt reports unable to provide any assistance  Lives With: Spouse IADL History Homemaking Responsibilities: Yes Meal Prep Responsibility: Secondary Laundry  Responsibility: Secondary Cleaning Responsibility: Secondary Bill Paying/Finance Responsibility: Secondary Shopping Responsibility: Secondary Current License: Yes Mode of Transportation: Car, Other (comment) (truck, uses truck  most often) Occupation: Part time employment 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 [employer] Prior Function Level of Independence: Independent with basic ADLs, Independent with homemaking with ambulation, Independent with transfers, Independent with gait  Able to Take Stairs?: Yes Driving: Yes Vocation: Part time employment Vision Baseline Vision/History: 1 Wears glasses (readers) Ability to See in Adequate Light: 0 Adequate Patient Visual Report: No change from baseline Vision Assessment?: No apparent visual deficits Perception  Perception: Within Functional Limits Praxis Praxis: WFL Cognition Cognition Overall Cognitive Status: Within Functional Limits for tasks assessed Arousal/Alertness: Awake/alert Orientation Level: Person;Situation;Place Person: Oriented Place: Oriented Situation: Oriented Memory: Appears intact Awareness: Impaired Problem Solving: Impaired Problem Solving Impairment: Functional complex Safety/Judgment: Impaired Brief Interview for Mental Status (BIMS) Repetition of Three Words (First Attempt): 3 Temporal Orientation: Year: Correct Temporal Orientation: Month: Missed by 6 days to 1 month Temporal Orientation: Day: Incorrect Recall: "Sock": Yes, no cue required Recall: "Blue": Yes, no cue required Recall: "Bed": Yes, no cue required BIMS Summary Score: 13 Sensation Sensation Light Touch: Impaired by gross assessment Light Touch Impaired Details: Impaired LLE Hot/Cold: Appears Intact Proprioception: Impaired by gross assessment Stereognosis: Not tested Coordination Gross Motor Movements are Fluid and Coordinated: No Fine Motor Movements are Fluid and Coordinated: No Coordination and Movement Description: BUE  uncoordinated 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 and impaired DIP d/t psoriatic arthritis contractures Motor  Motor Motor: Other (comment) Motor - Skilled Clinical Observations: BUE movement impaired 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 and impaired DIP d/t psoriatic arthritis contractures  Trunk/Postural Assessment  Cervical Assessment Cervical Assessment: Exceptions to [facility] (forward head) Thoracic Assessment Thoracic Assessment: Exceptions to [facility] (rounded shoulders) Lumbar Assessment Lumbar Assessment: Exceptions to [facility] (posterior pelvic tilt) Postural Control Postural Control: Deficits on evaluation Trunk Control: poor trunk control unsupported d/t spina bifida Righting Reactions: delayed  Balance Balance Balance Assessed: Yes Static Sitting Balance Static Sitting - Balance Support: Feet supported;Bilateral upper extremity supported Static Sitting - Level of Assistance: 1: +1 Total assist Dynamic Sitting Balance Dynamic Sitting - Balance Support: Feet supported;Bilateral upper extremity supported (in W/C) Dynamic Sitting - Level of Assistance: 4: Min assist Dynamic Sitting - Balance Activities: Reaching for objects;Forward lean/weight shifting Sitting balance - Comments: cues and assist for anterior lean Static Standing Balance Static Standing - Balance Support: Bilateral upper extremity supported Static Standing - Level of Assistance: 1: +1 Total assist (1:1 to rise to stand, once standing ~Mod A) Static Standing - Comment/# of Minutes: >1 min Dynamic Standing Balance Dynamic Standing - Balance Support: Bilateral upper extremity supported Dynamic Standing - Level of Assistance: 1: +1 Total assist Extremity/Trunk Assessment RUE Assessment RUE Assessment: Exceptions to [facility] Active Range of Motion (AROM) Comments: ~ 20-25 degrees shoulder ROM, WFL bicep General Strength Comments: 2-/5 d/t frozen shoulder, 4/5 distal LUE Assessment LUE  Assessment: Exceptions to [facility] Active Range of Motion (AROM) Comments: shoulder 90 degrees, WFL distal General Strength Comments: 4/5 overall LUE  Care Tool Care Tool Self Care Eating   Eating Assist Level: Set up assist    Oral Care    Oral Care Assist Level: Supervision/Verbal cueing    Bathing         Assist Level: Maximal Assistance - Patient 24 - 49%    Upper Body Dressing(including orthotics)       Assist Level: Maximal Assistance - Patient 25 - 49%    Lower Body Dressing (excluding footwear)     Assist for lower body dressing: Total Assistance -  Patient < 25%    Putting on/Taking off footwear   What is the patient wearing?: Socks Assist for footwear: Total Assistance - Patient < 25%       Care Tool Toileting Toileting activity   Assist for toileting: Total Assistance - Patient < 25%     Care Tool Bed Mobility Roll left and right activity   Roll left and right assist level: Maximal Assistance - Patient 25 - 49%    Sit to lying activity   Sit to lying assist level: Maximal Assistance - Patient 25 - 49%    Lying to sitting on side of bed activity   Lying to sitting on side of bed assist level: the ability to move from lying on the back to sitting on the side of the bed with no back support.: Maximal Assistance - Patient 25 - 49%     Care Tool Transfers Sit to stand transfer   Sit to stand assist level: 2 Helpers    Chair/bed transfer   Chair/bed transfer assist level: 2 Software engineer transfer   Assist Level: 2 Helpers     Care Tool Cognition  Expression of Ideas and Wants Expression of Ideas and Wants: 4. Without difficulty (complex and basic) - expresses complex messages without difficulty and with speech that is clear and easy to understand  Understanding Verbal and Non-Verbal Content Understanding Verbal and Non-Verbal Content: 4. Understands (complex and basic) - clear comprehension without cues or repetitions   Memory/Recall Ability  Memory/Recall Ability : That he or she is in a hospital/hospital unit;Staff names and faces   Refer to Care Plan for Long Term Goals  SHORT TERM GOAL WEEK 1 OT Short Term Goal 1 (Week 1): Pt will sit EOB while completing ADLs with Max A OT Short Term Goal 2 (Week 1): Pt will complete LB dressing with Max A with AE as necessary OT Short Term Goal 3 (Week 1): Pt will complete UB dressing with Mod A  Recommendations for other services: Therapeutic Recreation  Pet therapy and Outing/community reintegration   Skilled Therapeutic Intervention ADL ADL Eating: Set up Grooming: Supervision/safety Where Assessed-Grooming: Sitting at sink Upper Body Bathing: Maximal assistance Where Assessed-Upper Body Bathing: Sitting at sink Lower Body Bathing: Maximal assistance Where Assessed-Lower Body Bathing: Sitting at sink Upper Body Dressing: Maximal assistance Where Assessed-Upper Body Dressing: Wheelchair Lower Body Dressing: Dependent Where Assessed-Lower Body Dressing: Sitting at sink Toileting: Dependent (2 helpers) Where Assessed-Toileting: Toilet;Bedside Commode Toilet Transfer: Other (comment) (2 helpers) Statistician Method: Surveyor, minerals: Gaffer: Unable to assess Tub/Shower Transfer Method: Unable to assess Mobility  Bed Mobility Bed Mobility: Rolling Right;Rolling Left;Supine to Sit Rolling Right: Total Assistance - Patient < 25% Rolling Left: Total Assistance - Patient < 25% Supine to Sit: 2 Helpers Transfers Sit to Stand: 2 Helpers Stand to Sit: 2 Helpers   Tx Session 1:1 evaluation and treatment session initiated this date. OT roles, goals and purpose discussed with pt as well as therapy schedule. ADL completed this date with levels of assist listed above. Pt with significant balance, endurance, functional ADL/mobility, ROM deficits. Pt required VC for safe transfer technique and RW management during session. Pt required  redirection to tasks during eval. Pt required increased time to complete tasks d/t pain, especially transfers. Pt would benefit from AE training in following sessions for increased independence with ADLs. Pt would benefit from skilled OT in IPR setting in order to maximize independence with  ADLs upon D/C.    Discharge Criteria: Patient will be discharged from OT if patient refuses treatment 3 consecutive times without medical reason, if treatment goals not met, if there is a change in medical status, if patient makes no progress towards goals or if patient is discharged from hospital.  The above assessment, treatment plan, treatment alternatives and goals were discussed and mutually agreed upon: by patient  Velia Meyer, OTD, OTR/L 06/17/2023, 12:59 PM

## 2023-06-17 NOTE — Progress Notes (Signed)
Signed     Expand All Collapse All PMR Admission Coordinator Pre-Admission Assessment   Patient: Dylan Bruce is an 77 y.o., male MRN: [id number] DOB: December 01, 1945 Height: 6\' 2"  (188 cm) Weight: 90.2 kg                                                                                                                                               Insurance Information HMO:     PPO: yes     PCP:      IPA:      80/20:      OTHER:  PRIMARY: UMR/[employer] Choice Plus      Policy#: [id number]      Subscriber: pt CM Name: Layla Maw      Phone#: [phone]   Fax#: [phone] Pre-Cert#: [id number]  approved for 7 days 8/10 until 8/17 f/u with Kathlen Brunswick phone [phone] fax [phone]    Employer: wife; [employer] Benefits:  Phone #: [phone]     Name: 8/8 Eff. Date: 11/06/22     Deduct: $150      Out of Pocket Max: $3000      Life Max: none  CIR: 80%      SNF: 80% Outpatient: 80%     Co-Pay: 20% Home Health: 80%      Co-Pay: 20% DME: 80%     Co-Pay: 20% Providers: in network  SECONDARY: Medicare a and b    Policy#: [id number]      passport one source active a 01/04/2013 and b 11/06/2013 checked 8/8   Financial Counselor:       Phone#:    The "Data Collection Information Summary" for patients in Inpatient Rehabilitation Facilities with attached "Privacy Act Statement-Health Care Records" was provided and verbally reviewed with: Patient and Family   Emergency Contact Information Contact Information       Name Relation Home Work Mobile    [place] Spouse [phone]   [phone]         Other Contacts       Name Relation Home Work Mobile    [place] Daughter     [phone]    [place], Cooprider     [phone]         Current Medical History  Patient Admitting Diagnosis: Hip fracture   History of Present Illness: 77 yo male with history of TAVR, DM,stage IIIa CKD HTN and spina bifida. Presented on 06/08/23 to [facility] ED after a fall following his first Outpatient  therapy session. Recent history of frequent falls. Sent home and returned to [facility] ED 8/3.   CT head no acute issues, xray showed left rib fracture. CT of hip confirmed intertrochanteric left hip fracture. Orthopedic accepted for transfer on 8/3 to [facility] for surgical intervention. Underwent IM nailing by Dr [name] on 8/4.    Eliquis for VTE prophylaxis. Pain  control, augmented dosing based on opoid tolerance/dependence prior to admit. Continue home Lyrica. WBAT.    Has RLE leg wound in presence of venous insufficiency. Initially given antibiotics, now discontinued. Need podiatry follow up. Has been in 4:1 atrial flutter. Resumed Eliquis. Discussed with patient and son. Will continue DOAC for now. No rate control agents on PTA med list, to monitor HR. Some atypical chest pain on 8/6. ECG without ST elevations and Troponin reassuring 23>22. Felt to be postoperative flatulence and improved with Maalox. Continue home Imdur and prn Maalox. Stage IIIA CKD . Stopped empagliflozin. CR stable. Continue torsemide and monitor. Continue glargine, and Novolog and resistant SSI. Hgb A1c 8.5. Tamsulosin for BPH. Statin and Zetia for HLD. Some concern for sleep disorder, known OSA. Began trial of Modafinil. Needs referral to Neurology . Restrict driving. Continue CPAP at HS.    Patient's medical record from [facility] has been reviewed by the rehabilitation admission coordinator and physician.   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 Clifton James and Dr. Delia Chimes   SLEEP APNEA     SPINA BIFIDA          Has the patient had major surgery during 100 days prior to admission?  Yes   Family History  family history includes Prostate cancer in his father; Valvular heart disease in his father.   Current Medications   Current Medications    Current Facility-Administered Medications:    albuterol (PROVENTIL) (2.5 MG/3ML) 0.083% nebulizer solution 2.5 mg, 2.5 mg, Nebulization, Q3H PRN, [name], MD   alum & mag hydroxide-simeth (MAALOX/MYLANTA) 200-200-20 MG/5ML suspension 30 mL, 30 mL, Oral, Q4H PRN, Janine Ores K, PA-C, 30 mL at 06/13/23 1732   apixaban (ELIQUIS) tablet 5 mg, 5 mg, Oral, BID, Hazeline Junker B, MD, 5 mg at 06/15/23 1007   atorvastatin (LIPITOR) tablet 80 mg, 80 mg, Oral, Daily, Hazeline Junker B, MD, 80 mg at 06/15/23 1006   bisacodyl (DULCOLAX) suppository 10 mg, 10 mg, Rectal, Daily PRN, Manson Passey, Blaine K, PA-C   clopidogrel (PLAVIX) tablet 75 mg, 75 mg, Oral, Daily, Hazeline Junker B, MD, 75 mg at 06/15/23 1007   docusate sodium (COLACE) capsule 100 mg, 100 mg, Oral, BID, Brown, Blaine K, PA-C, 100 mg at 06/15/23 1008   ezetimibe (ZETIA) tablet 10 mg, 10 mg, Oral, Daily, Hazeline Junker B, MD, 10 mg at 06/15/23 1007   feeding supplement (GLUCERNA SHAKE) (GLUCERNA SHAKE) liquid 237 mL, 237 mL, Oral, TID BM, Hazeline Junker B, MD, 237 mL at 06/15/23 1350   ferrous sulfate tablet 325 mg, 325 mg, Oral, TID PC, Brown, Blaine K, PA-C, 325 mg at 06/15/23 1255   fluticasone (FLONASE) 50 MCG/ACT nasal spray 1 spray, 1 spray, Each Nare, Daily, [name], MD, 1 spray at 06/15/23 1011   hydrocerin (EUCERIN) cream, , Topical, BID, Luiz Iron, NP, Given at 06/15/23 1008   hydrOXYzine (ATARAX) tablet 25 mg, 25 mg, Oral, Daily PRN, Janine Ores K, PA-C, 25 mg at 06/15/23 1205  insulin aspart (novoLOG) injection 0-20 Units, 0-20 Units, Subcutaneous, TID WC, [name], MD, 4 Units at 06/15/23 1203   insulin aspart (novoLOG) injection 0-5 Units, 0-5 Units, Subcutaneous, QHS, [name], MD, 4 Units at 06/14/23 2116   insulin aspart (novoLOG) injection 12 Units, 12  Units, Subcutaneous, TID WC, [name], MD, 12 Units at 06/15/23 1203   insulin glargine-yfgn (SEMGLEE) injection 25 Units, 25 Units, Subcutaneous, BID, Azucena Fallen, MD, 25 Units at 06/15/23 1345   isosorbide mononitrate (IMDUR) 24 hr tablet 30 mg, 30 mg, Oral, Daily, Hazeline Junker B, MD, 30 mg at 06/15/23 1005   magnesium citrate solution 1 Bottle, 1 Bottle, Oral, Once PRN, Manson Passey, Blaine K, PA-C   magnesium oxide (MAG-OX) tablet 400 mg, 400 mg, Oral, Daily, Hazeline Junker B, MD, 400 mg at 06/15/23 1005   menthol-cetylpyridinium (CEPACOL) lozenge 3 mg, 1 lozenge, Oral, PRN **OR** phenol (CHLORASEPTIC) mouth spray 1 spray, 1 spray, Mouth/Throat, PRN, Manson Passey, Blaine K, PA-C   mirtazapine (REMERON) tablet 30 mg, 30 mg, Oral, QHS, [name], MD, 30 mg at 06/14/23 2109   modafinil (PROVIGIL) tablet 100 mg, 100 mg, Oral, Daily, Raulkar, Drema Pry, MD, 100 mg at 06/15/23 0836   mometasone-formoterol (DULERA) 200-5 MCG/ACT inhaler 2 puff, 2 puff, Inhalation, BID, [name], MD, 2 puff at 06/15/23 0831   multivitamin with minerals tablet 1 tablet, 1 tablet, Oral, Daily, [name], MD, 1 tablet at 06/15/23 1006   nitroGLYCERIN (NITROSTAT) SL tablet 0.4 mg, 0.4 mg, Sublingual, Q5 min PRN, Hazeline Junker B, MD, 0.4 mg at 06/12/23 1717   ondansetron (ZOFRAN) tablet 4 mg, 4 mg, Oral, Q6H PRN **OR** ondansetron (ZOFRAN) injection 4 mg, 4 mg, Intravenous, Q6H PRN, Armida Sans, PA-C   Oral care mouth rinse, 15 mL, Mouth Rinse, PRN, Manson Passey, Blaine K, PA-C   oxyCODONE (Oxy IR/ROXICODONE) immediate release tablet 10-15 mg, 10-15 mg, Oral, Q4H PRN, Manson Passey, Blaine K, PA-C, 15 mg at 06/15/23 1255   oxyCODONE (Oxy IR/ROXICODONE) immediate release tablet 5-10 mg, 5-10 mg, Oral, Q4H PRN, Manson Passey, Blaine K, PA-C, 10 mg at 06/12/23 1137   pantoprazole (PROTONIX) EC tablet 40 mg, 40 mg, Oral, Daily, Hazeline Junker B, MD, 40 mg at 06/15/23 1006   polyethylene glycol (MIRALAX / GLYCOLAX) packet 17 g, 17 g, Oral, Daily  PRN, Manson Passey, Blaine K, PA-C, 17 g at 06/15/23 1430   pregabalin (LYRICA) capsule 100 mg, 100 mg, Oral, BID, Hazeline Junker B, MD, 100 mg at 06/15/23 1006   senna (SENOKOT) tablet 8.6 mg, 1 tablet, Oral, BID, Brown, Blaine K, PA-C, 8.6 mg at 06/15/23 1006   sodium chloride 0.9 % bolus 500 mL, 500 mL, Intravenous, Once PRN, [name], MD   tamsulosin [facility]) capsule 0.4 mg, 0.4 mg, Oral, Daily, Hazeline Junker B, MD, 0.4 mg at 06/15/23 1005   torsemide (DEMADEX) tablet 20 mg, 20 mg, Oral, Daily, Hazeline Junker B, MD, 20 mg at 06/15/23 1005   Vilazodone HCl TABS 40 mg, 40 mg, Oral, Daily, Hazeline Junker B, MD, 40 mg at 06/15/23 1006     Patients Current Diet:  Diet Order                  Diet regular Room service appropriate? Yes; Fluid consistency: Thin  Diet effective now                       Precautions / Restrictions Precautions Precautions: Fall Precaution  Comments: watch closely; possible narcolepsy Restrictions Weight Bearing Restrictions: No LLE Weight Bearing: Weight bearing as tolerated    Has the patient had 2 or more falls or a fall with injury in the past year?Yes   Prior Activity Level Community (5-7x/wk): Mod I with cane   Prior Functional Level Prior Function Prior Level of Function : Independent/Modified Independent Mobility Comments: Walks without assistive device ADLs Comments: independent   Self Care: Did the patient need help bathing, dressing, using the toilet or eating?  Independent   Indoor Mobility: Did the patient need assistance with walking from room to room (with or without device)? Independent   Stairs: Did the patient need assistance with internal or external stairs (with or without device)? Independent   Functional Cognition: Did the patient need help planning regular tasks such as shopping or remembering to take medications? Independent   Patient Information Are you of Hispanic, Latino/a,or Spanish origin?: A. No, not of Hispanic, Latino/a, or  Spanish origin What is your race?: A. White Do you need or want an interpreter to communicate with a doctor or health care staff?: 0. No   Patient's Response To:  Health Literacy and Transportation Is the patient able to respond to health literacy and transportation needs?: Yes Health Literacy - How often do you need to have someone help you when you read instructions, pamphlets, or other written material from your doctor or pharmacy?: Never In the past 12 months, has lack of transportation kept you from medical appointments or from getting medications?: No In the past 12 months, has lack of transportation kept you from meetings, work, or from getting things needed for daily living?: No   Journalist, newspaper / Equipment Home Assistive Devices/Equipment: None Home Equipment: Shower seat   Prior Device Use: Indicate devices/aids used by the patient prior to current illness, exacerbation or injury?  cane   Current Functional Level Cognition   Overall Cognitive Status: Within Functional Limits for tasks assessed Orientation Level: Oriented X4 Following Commands: Follows one step commands with increased time General Comments: no family present this session, pt with improved command following and was able to follow most instructions on first attempt, may be close to baseline    Extremity Assessment (includes Sensation/Coordination)   Upper Extremity Assessment: Generalized weakness (limited strength d/t left rib fracture)  Lower Extremity Assessment: Generalized weakness RLE Deficits / Details: wound anterior lower leg dressed LLE Deficits / Details: Painful and uncomfortable with movement; grossly decr AROM and strength, limited by pain post fx and surgical repair LLE Coordination: decreased gross motor     ADLs   Overall ADL's : Needs assistance/impaired Eating/Feeding: Modified independent, Sitting Grooming: Oral care, Set up, Sitting Upper Body Bathing: Set up, Sitting Lower  Body Bathing: Maximal assistance, Sit to/from stand Upper Body Dressing : Minimal assistance, Sitting Upper Body Dressing Details (indicate cue type and reason): required assist to pull shirt down in the back and front while seated in recliner. Lower Body Dressing: Total assistance Lower Body Dressing Details (indicate cue type and reason): sitting in recliner. sock management General ADL Comments: OT providing assist with helping patient return to bed and eat dinner. Focused session on progressing functional mobility     Mobility   Overal bed mobility: Needs Assistance Bed Mobility: Sit to Supine Supine to sit: Min assist Sit to supine: Mod assist Sit to sidelying: Mod assist General bed mobility comments: Mod A to help with LLE movement to bed, cues to use bed rails to assist  with mobility     Transfers   Overall transfer level: Needs assistance Equipment used: Rolling walker (2 wheels) Transfers: Sit to/from Stand Sit to Stand: Mod assist Bed to/from chair/wheelchair/BSC transfer type:: Step pivot Step pivot transfers: Mod assist General transfer comment: Mod A with cues for anterior leaning, hand placeement, and bringing heels back, pt needing constant cueing.     Ambulation / Gait / Stairs / Wheelchair Mobility   Ambulation/Gait Ambulation/Gait assistance: Editor, commissioning (Feet): 6 Feet Assistive device: Rolling walker (2 wheels) Gait Pattern/deviations: Step-to pattern, Antalgic, Decreased step length - right, Decreased stance time - left, Decreased stride length, Decreased weight shift to left General Gait Details: minA to RW and minA to patient. small step-to pattern with limited wt on LLE Pre-gait activities: standing wt shift and standing marches 2 x 10     Posture / Balance Dynamic Sitting Balance Sitting balance - Comments: Pt using bed rails to maintain sitting balance, cues to tighten core and focus on anterior lean Balance Overall balance assessment: Needs  assistance Sitting-balance support: Feet supported, Bilateral upper extremity supported Sitting balance-Leahy Scale: Poor Sitting balance - Comments: Pt using bed rails to maintain sitting balance, cues to tighten core and focus on anterior lean Postural control: Right lateral lean Standing balance support: Bilateral upper extremity supported, During functional activity, Reliant on assistive device for balance Standing balance-Leahy Scale: Poor Standing balance comment: reliant on RW     Special needs/care consideration Hgb A1c 8.5 Fall precautions CPAP at [employer], [facility], RN  Registered Nurse WOC   Consult Note    Signed   Date of Service: 06/10/2023  7:26 PM    Signed       Show:Clear all [x] Written[x] Templated[] Copied   Added by: [x] McNichol, [facility]ney [facility], RN   [] Hover for details WOC Nurse Consult Note: Reason for Consult:Right LE pretibial full thickness wound in the presence of venous insufficiency, pre ulcerative callus to right foot Wound type:venous insufficiency, neuropathic Pressure Injury POA: N/A Measurement:Right LE wound to be measured by bedside RN with application of next dressing change today Wound bed:See also photo documentation uploaded to EMR by Provider, red, moist Drainage (amount, consistency, odor) light yellow exudate Periwound:hemosiderin staining Dressing procedure/placement/frequency: I have provided Nursing with guidance for the care of the right pretibial ulcer using a daily cleanse and dressing with antimicrobial nonadherent gauze (xeroform).    Recommendations: The pre callus area on the right foot can be managed with a post acute referral to Podiatric Medicine. 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   Turning and repositioning to prevent sacral PI is an important element of the POC as is floatation of the heels to prevent PI to those areas.   WOC nursing  team will not follow, but will remain available to this patient, the nursing and medical teams.  Please re-consult if needed.   Thank you for inviting [place] to participate in this patient's Plan of Care.   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 Began med trial on 8/8      Previous Home Environment  Living Arrangements: Spouse/significant other  Lives With: Spouse Available Help at Discharge: Family, Available 24 hours/day (wife, son and dtr) Type of Home: House Home Layout: One  level Home Access: Ramped entrance Entrance Stairs-Rails: None Entrance Stairs-Number of Steps: ramp entrance Bathroom Shower/Tub: Engineer, manufacturing systems: Standard Bathroom Accessibility: Yes Home Care Services: Other (Comment) (was at his first OP therapy visit)   Discharge Living Setting Plans for Discharge Living Setting: Patient's home, Lives with (comment) (wife) Type of Home at Discharge: House Discharge Home Layout: One level Discharge Home Access: Ramped entrance Discharge Bathroom Shower/Tub: Tub/shower unit Discharge Bathroom Toilet: Standard Discharge Bathroom Accessibility: Yes How Accessible: Accessible via walker Does the patient have any problems obtaining your medications?: No   Social/Family/Support Systems Patient Roles: Spouse Contact Information: wife, Erskine Squibb Anticipated Caregiver: wife, son, dtr and possible hired Field seismologist Information: see contacts Ability/Limitations of Caregiver: wife with recent wrist fx, works for [employer] Availability: 24/7 Discharge Plan Discussed with Primary Caregiver: Yes Is Caregiver In Agreement with Plan?: Yes Does Caregiver/Family have Issues with Lodging/Transportation while Pt is in Rehab?: No   Goals Patient/Family Goal for Rehab: supervision with PT and OT Expected length of stay: ELOS 10 to 14  days Additional Information: wife with recent wrist fx. to return to work soon; family to provide 24/7 Pt/Family Agrees to Admission and willing to participate: Yes Program Orientation Provided & Reviewed with Pt/Caregiver Including Roles  & Responsibilities: Yes   Decrease burden of Care through IP rehab admission: n/a   Possible need for SNF placement upon discharge:not anticipated   Patient Condition: This patient's medical and functional status has changed since the consult dated: 06/13/23 in which the Rehabilitation Physician determined and documented that the patient's condition is appropriate for intensive rehabilitative care in an inpatient rehabilitation facility. See "History of Present Illness" (above) for medical update. Functional changes are: overall mod assist. Patient's medical and functional status update has been discussed with the Rehabilitation physician and patient remains appropriate for inpatient rehabilitation. Will admit to inpatient rehab 06/16/23 when bed is available.   Preadmission Screen Completed By:  Clois Dupes, RN MSN 06/15/2023 3:29 PM ______________________________________________________________________   Discussed status with Dr. Berline Chough on 06/15/23 at 1500 and received approval for admission 06/16/23 when bed is available.   Admission Coordinator:  Clois Dupes, RN MSN time [id number] Date 06/15/23

## 2023-06-17 NOTE — Progress Notes (Addendum)
Inpatient Rehabilitation Admission Medication Review by a Pharmacist  A complete drug regimen review was completed for this patient to identify any potential clinically significant medication issues.  High Risk Drug Classes Is patient taking? Indication by Medication  Antipsychotic No   Anticoagulant Yes Apixaban - atrial fibrillation  Antibiotic No   Opioid Yes PRN Oxycodone - severe pain  Antiplatelet Yes Clopidogrel - s/p TAVR 02/21/2023  Hypoglycemics/insulin Yes SSI, meal coverage, insulin glargine - DM Type 2  Vasoactive Medication Yes Isosorbide mononitrate, torsemide - hypertension  Chemotherapy No   Other Yes Acetaminophen 650 mg QID - pain Atorvastatin, ezetemibe - hyperlipidemia Docusate, senna - laxatives Ferrous sulfate, Magnesium oxide, multivitamin - supplements Fluticasone nasal spray - allergies Namebutone (x 4 days) - pain adjunct Mirtazapine, vilazodone - mood stabilization Modafanil - daytime drowsiness Dulera (for home Symbicort) - COPD Pantoprazole (for home Esomeprazole) - GERD Pregabalin - neuropathic pain Tamsulosin - BPH  PRNs:  Acetaminophen - mild pain (not to exceed total 4 gm/24 hrs with scheduled Acetaminophen) Albuterol nebs - wheezing, shortness of breath Maalox - indigestions Guaifenesin-dextromethorphan - cough Hydroxyzine - itching, anxiety, sleep Methocarbamol - muscle spasms Ondansetron - nausea/vomiting Miralax, sorbitol, Fleets enema - constipation Desonide or clobetasol creams - psoriasis Ivermectin cream - rosacea     Type of Medication Issue Identified Description of Issue Recommendation(s)  Drug Interaction(s) (clinically significant)     Duplicate Therapy     Allergy     No Medication Administration End Date     Incorrect Dose     Additional Drug Therapy Needed     Significant med changes from prior encounter (inform family/care partners about these prior to discharge). Modafanil and ferrous sulfate are new; and  namebutone, but short course for now.   Off prior nightly cyclobenzaprine and tinazadine, and prior percocet-10  Off prior fenofibrate, metformin, Jardiance, Uloric, Ozempic, Cosentyx. Off several prn meds: triamcinolone cream (using others), anusol HC suppository, meclizine. Communicate changes with patient/family prior to discharge.  Methocarbamol prn for now; prn Oxycodone.   Resume as clinically warranted/at discharge; on 2 other lipid meds, and insulin for now.   Other       Clinically significant medication issues were identified that warrant physician communication and completion of prescribed/recommended actions by midnight of the next day:  No  Pharmacist comments:  - Family may provide home Cosentyx for psoriasis, since overdue for q30-day dose.  Time spent performing this drug regimen review (minutes):  [address]   Dennie Fetters, [place] 06/17/2023 8:03 AM

## 2023-06-17 NOTE — Progress Notes (Signed)
Horton Chin, MD Physician Physical Medicine and Rehabilitation   Consult Note    Signed   Date of Service: 06/13/2023 11:35 AM  Related encounter: Admission (Discharged) from 06/09/2023 in [facility]  SURGICAL   Signed     Expand All Collapse All  Show:Clear all [x] Written[x] Templated[] Copied  Added by: [x] Raulkar, Drema Pry, MD  [] Hover for details          Physical Medicine and Rehabilitation Consult Reason for Consult:Closed left hip fracture Referring Physician: Hazeline Junker, MD     HPI: Dylan LOY is a 77 y.o. male with a history of AS s/p TAVR, type 2 DM, HTN, frequent falls, who was transferred from [facility] on 8/3 to [facility] for orthopedic eval. He had fallen at a PT session and was found  to have a left rib fracture initially, and returned the next day to their ED with hip pain and was found to have an intertrochanteric left hip fracture for which he requested admission to [facility]. He has IM nail placement on 8/4 by Dr. [name]. Physical Medicine & Rehabilitation was consulted to assess candidacy for CIR.       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 Clifton James and Dr. Delia Chimes   SLEEP APNEA     SPINA BIFIDA               Past Surgical History:  Procedure Laterality Date   BACK SURGERY       CARPAL TUNNEL RELEASE Left 08/21/2019    Procedure: CARPAL TUNNEL RELEASE;  Surgeon: Cindee Salt, MD;  Location: [facility];  Service: Orthopedics;  Laterality: Left;  AXILLARY BLOCK   CORONARY ARTERY BYPASS GRAFT       INTRAMEDULLARY (IM) NAIL INTERTROCHANTERIC Left 06/10/2023    Procedure: INTRAMEDULLARY (IM) NAIL  INTERTROCHANTERIC;  Surgeon: Teryl Lucy, MD;  Location: [facility] OR;  Service: Orthopedics;  Laterality: Left;   INTRAOPERATIVE TRANSTHORACIC ECHOCARDIOGRAM N/A 02/21/2023    Procedure: INTRAOPERATIVE TRANSTHORACIC ECHOCARDIOGRAM;  Surgeon: Kathleene Hazel, MD;  Location: [facility];  Service: Open Heart Surgery;  Laterality: N/A;   JOINT REPLACEMENT Left      shoulder   LEFT HEART CATH AND CORS/GRAFTS ANGIOGRAPHY N/A 06/18/2018    Procedure: LEFT HEART CATH AND CORS/GRAFTS ANGIOGRAPHY;  Surgeon: Runell Gess, MD;  Location: [facility] CV [facility];  Service: Cardiovascular;  Laterality: N/A;   RIGHT/LEFT HEART CATH AND CORONARY/GRAFT ANGIOGRAPHY N/A 01/11/2023    Procedure: RIGHT/LEFT HEART CATH AND CORONARY/GRAFT ANGIOGRAPHY;  Surgeon: Kathleene Hazel, MD;  Location: [facility];  Service: Cardiovascular;  Laterality: N/A;   TRANSCATHETER AORTIC VALVE REPLACEMENT, TRANSFEMORAL Left 02/21/2023    Procedure: Transcatheter Aortic Valve Replacement, Transfemoral;  Surgeon: Kathleene Hazel, MD;  Location: [facility];  Service: Open Heart Surgery;  Laterality: Left;   TRIGGER FINGER RELEASE Left 08/21/2019    Procedure: RELEASE TRIGGER LEFT SMALL FINGER LEFT INDEX;  Surgeon: Cindee Salt, MD;  Location: [facility]  [place];  Service: Orthopedics;  Laterality: Left;   ULNAR NERVE TRANSPOSITION Left 08/21/2019    Procedure: DECOMPRESSION WITH ULNAR NERVE LEFT CUBITAL TUNNEL ULNAR;  Surgeon: Cindee Salt, MD;  Location: [facility];  Service: Orthopedics;  Laterality: Left;             Family History  Problem Relation Age of Onset   Valvular heart disease Father     Prostate cancer Father          Social History:  reports that he has never smoked. He has never used smokeless tobacco. He reports that he does not currently use alcohol. He reports that he does not use drugs. Allergies:  Allergies       Allergies  Allergen Reactions    Cefuroxime Diarrhea and Nausea And Vomiting      Severe. Can take Ancef 2g IV on 02/21/2023 without reporting any issue   Ibuprofen Anaphylaxis and Shortness Of Breath            Iodinated Contrast Media Shortness Of Breath and Other (See Comments)      Pt developed burning of face and SOB after 02/06/2023 CT scan    Cefpodoxime Rash   Ciprofloxacin Rash   Doxycycline Rash   Sulfamethoxazole-Trimethoprim Rash   Fluocinolone Other (See Comments)      Unknown reaction   Probenecid Other (See Comments)      Unknown reaction   Allopurinol Rash   Azithromycin Rash   Sulfa Antibiotics Rash            Medications Prior to Admission  Medication Sig Dispense Refill   albuterol (PROVENTIL HFA;VENTOLIN HFA) 108 (90 Base) MCG/ACT inhaler Inhale 2 puffs into the lungs every 4 (four) hours as needed for wheezing.       [EXPIRED] amoxicillin-clavulanate (AUGMENTIN) 875-125 MG tablet Take 1 tablet by mouth 2 (two) times daily.       apixaban (ELIQUIS) 5 MG TABS tablet Take 1 tablet (5 mg total) by mouth 2 (two) times daily. 120 tablet 2   atorvastatin (LIPITOR) 80 MG tablet Take 1 tablet (80 mg total) by mouth daily. 30 tablet 0   budesonide-formoterol (SYMBICORT) 160-4.5 MCG/ACT inhaler Inhale 2 puffs into the lungs in the morning and at bedtime.       clobetasol cream (TEMOVATE) 0.05 % Apply 1 application topically daily as needed (rash).        clopidogrel (PLAVIX) 75 MG tablet Take 1 tablet (75 mg total) by mouth daily. 90 tablet 3   COSENTYX SENSOREADY, 300 MG, 150 MG/ML SOAJ Inject 300 mg into the skin every 30 (thirty) days.       cyclobenzaprine (FLEXERIL) 10 MG tablet Take 10 mg by mouth at bedtime.        desonide (DESOWEN) 0.05 % cream Apply 1 application topically daily as needed (rash).        docusate sodium (COLACE) 100 MG capsule Take 100 mg by mouth daily.        DOXYCYCLINE CALCIUM PO Take by mouth in the morning and at bedtime.       esomeprazole (NEXIUM) 40 MG capsule Take 40 mg  by mouth daily before breakfast.       ezetimibe (ZETIA) 10 MG tablet Take 10 mg by mouth daily.       fenofibrate (TRICOR) 145 MG tablet Take 145 mg by mouth daily.       fluticasone (FLONASE) 50 MCG/ACT nasal spray Place 1 spray into both nostrils  in the morning and at bedtime.       hydrocortisone (ANUCORT-HC) 25 MG suppository Place 1 suppository rectally 2 (two) times daily as needed for hemorrhoids.       hydrOXYzine (VISTARIL) 25 MG capsule Take 25 mg by mouth daily as needed for itching (sleep).       insulin lispro (HUMALOG KWIKPEN) 100 UNIT/ML KiwkPen Inject 18-24 Units into the skin 3 (three) times daily with meals.       isosorbide mononitrate (IMDUR) 30 MG 24 hr tablet Take 1 tablet (30 mg total) by mouth daily. Please schedule appointment for refills. 30 tablet 0   JARDIANCE 10 MG TABS tablet Take 10 mg by mouth daily.       LANTUS SOLOSTAR 100 UNIT/ML Solostar Pen Inject 22 Units into the skin at bedtime.   5   LYRICA 100 MG capsule Take 1 capsule (100 mg total) by mouth 2 (two) times daily.   5   magnesium oxide (MAG-OX) 400 (240 Mg) MG tablet Take 400 mg by mouth daily.       meclizine (ANTIVERT) 12.5 MG tablet Take 12.5 mg by mouth 3 (three) times daily as needed for dizziness.       metFORMIN (GLUCOPHAGE) 1000 MG tablet Take 1,000 mg by mouth 2 (two) times daily.       mirtazapine (REMERON) 30 MG tablet Take 1 tablet by mouth at bedtime.        nitroGLYCERIN (NITROSTAT) 0.4 MG SL tablet Place 1 tablet (0.4 mg total) under the tongue every 5 (five) minutes as needed for chest pain. (Patient taking differently: Place 0.4 mg under the tongue daily as needed (for difficulty breathing d/t fluid in lungs).) 90 tablet 3   nystatin cream (MYCOSTATIN) Apply 1 Application topically 2 (two) times daily.       oxyCODONE-acetaminophen (PERCOCET) 10-325 MG per tablet Take 1-2 tablets by mouth 2 (two) times daily as needed for pain.        Semaglutide, 1 MG/DOSE, (OZEMPIC, 1 MG/DOSE,) 4 MG/3ML  SOPN Inject 4 mg into the skin once a week.       SOOLANTRA 1 % CREA Apply 1 application  topically daily.   3   tamsulosin (FLOMAX) 0.4 MG CAPS capsule Take 0.4 capsules by mouth daily.       tiZANidine (ZANAFLEX) 4 MG tablet Take 4 mg by mouth at bedtime.       torsemide (DEMADEX) 20 MG tablet Take 20 mg by mouth daily.       triamcinolone cream (KENALOG) 0.1 % Apply 1 Application topically 2 (two) times daily as needed (for itching).       ULORIC 40 MG tablet Take 1 tablet by mouth Daily.       VIIBRYD 40 MG TABS Take 40 mg by mouth Daily.        Torsemide 40 MG TABS Take 40 mg by mouth daily. (Patient not taking: Reported on 06/10/2023) 60 tablet 2          Home: Home Living Family/patient expects to be discharged to:: Private residence Living Arrangements: Spouse/significant other Available Help at Discharge: Family Type of Home: House Home Access: Stairs to enter Secretary/administrator of Steps: 3 Entrance Stairs-Rails: None Home Layout: One level Bathroom Shower/Tub: Engineer, manufacturing systems: Standard Bathroom Accessibility: Yes Home Equipment: Banker History: Prior Function Prior Level of Function : Independent/Modified Independent Mobility Comments: Walks without assistive device ADLs Comments: independent Functional Status:  Mobility: Bed Mobility Overal  bed mobility: Needs Assistance Bed Mobility: Supine to Sit Supine to sit: Mod assist Sit to sidelying: Mod assist General bed mobility comments: Mod assist for LLE management, and cues for technqiue; smoother progression Transfers Overall transfer level: Needs assistance Equipment used: Rolling walker (2 wheels) Transfers: Sit to/from Stand Sit to Stand: Mod assist Bed to/from chair/wheelchair/BSC transfer type:: Step pivot Step pivot transfers: Min assist General transfer comment: stood from elevated bed; mod assist to rise and steady; cues for hand  placement Ambulation/Gait Ambulation/Gait assistance: Min assist Gait Distance (Feet): 9 Feet Assistive device: Rolling walker (2 wheels) Gait Pattern/deviations: Step-to pattern, Antalgic General Gait Details: Cues for sequence and to self-monitor for activity tolerance; very slow steps   ADL: ADL Overall ADL's : Needs assistance/impaired Eating/Feeding: Modified independent, Sitting Grooming: Oral care, Set up, Sitting Upper Body Bathing: Set up, Sitting Lower Body Bathing: Maximal assistance, Sit to/from stand Upper Body Dressing : Minimal assistance, Sitting Upper Body Dressing Details (indicate cue type and reason): required assist to pull shirt down in the back and front while seated in recliner. Lower Body Dressing: Total assistance Lower Body Dressing Details (indicate cue type and reason): sitting in recliner. sock management   Cognition: Cognition Overall Cognitive Status: Impaired/Different from baseline Orientation Level: Oriented X4 Cognition Behavior During Therapy: WFL for tasks assessed/performed Overall Cognitive Status: Impaired/Different from baseline Area of Impairment: Memory, Following commands, Problem solving Memory: Decreased short-term memory Following Commands: Follows one step commands with increased time Problem Solving: Requires verbal cues, Difficulty sequencing, Slow processing General Comments: Overall pleasant and hard-working; notable that had to repeat instructions often during session   Blood pressure 129/70, pulse 70, temperature 97.8 F (36.6 C), temperature source Oral, resp. rate 17, height 6\' 2"  (1.88 m), weight 90.2 kg, SpO2 97%. Physical Exam Gen: no distress, normal appearing HEENT: oral mucosa pink and moist, NCAT Cardio: Reg rate Chest: normal effort, normal rate of breathing Abd: soft, non-distended Ext: no edema Psych: pleasant, normal affect Skin: incision dressing c/d/I with ecchymosis Neuro: Alert and oriented  x3 Musculoskeletal: LLE strength limited by pain   Lab Results Last 24 Hours       Results for orders placed or performed during the hospital encounter of 06/09/23 (from the past 24 hour(s))  Glucose, capillary     Status: Abnormal    Collection Time: 06/12/23 12:13 PM  Result Value Ref Range    Glucose-Capillary 255 (H) 70 - 99 mg/dL  Glucose, capillary     Status: Abnormal    Collection Time: 06/12/23  4:36 PM  Result Value Ref Range    Glucose-Capillary 139 (H) 70 - 99 mg/dL  Troponin I (High Sensitivity)     Status: Abnormal    Collection Time: 06/12/23  5:41 PM  Result Value Ref Range    Troponin I (High Sensitivity) 23 (H) <18 ng/L  Troponin I (High Sensitivity)     Status: Abnormal    Collection Time: 06/12/23  8:02 PM  Result Value Ref Range    Troponin I (High Sensitivity) 22 (H) <18 ng/L  Glucose, capillary     Status: Abnormal    Collection Time: 06/12/23 10:22 PM  Result Value Ref Range    Glucose-Capillary 237 (H) 70 - 99 mg/dL  CBC     Status: Abnormal    Collection Time: 06/13/23 12:08 AM  Result Value Ref Range    WBC 9.4 4.0 - 10.5 K/uL    RBC 4.11 (L) 4.22 - 5.81 MIL/uL    Hemoglobin  10.2 (L) 13.0 - 17.0 g/dL    HCT 95.6 (L) 38.7 - 52.0 %    MCV 80.8 80.0 - 100.0 fL    MCH 24.8 (L) 26.0 - 34.0 pg    MCHC 30.7 30.0 - 36.0 g/dL    RDW 56.4 (H) 33.2 - 15.5 %    Platelets 299 150 - 400 K/uL    nRBC 0.0 0.0 - 0.2 %  Basic metabolic panel     Status: Abnormal    Collection Time: 06/13/23 12:08 AM  Result Value Ref Range    Sodium 135 135 - 145 mmol/L    Potassium 4.3 3.5 - 5.1 mmol/L    Chloride 102 98 - 111 mmol/L    CO2 24 22 - 32 mmol/L    Glucose, Bld 287 (H) 70 - 99 mg/dL    BUN 39 (H) 8 - 23 mg/dL    Creatinine, Ser 9.51 (H) 0.61 - 1.24 mg/dL    Calcium 8.4 (L) 8.9 - 10.3 mg/dL    GFR, Estimated 54 (L) >60 mL/min    Anion gap 9 5 - 15  Glucose, capillary     Status: Abnormal    Collection Time: 06/13/23  7:51 AM  Result Value Ref Range     Glucose-Capillary 279 (H) 70 - 99 mg/dL       Imaging Results (Last 48 hours)  VAS [place] ABI WITH/WO TBI   Result Date: 06/11/2023  LOWER EXTREMITY DOPPLER STUDY Patient Name:  Dylan Bruce  Date of Exam:   06/11/2023 Medical Rec #: [id number]       Accession #:    [id number] Date of Birth: 03/28/1946       Patient Gender: M Patient Age:   22 years Exam Location:  [facility] Procedure:      VAS [place] ABI WITH/WO TBI Referring Phys: Janine Ores --------------------------------------------------------------------------------  Indications: Ulceration. High Risk Factors: Hypertension, hyperlipidemia, Diabetes, no history of                    smoking, coronary artery disease.  Comparison Study: Previous 09/03/18 Performing Technologist: McKayla Maag RVT, [place]  Examination Guidelines: A complete evaluation includes at minimum, Doppler waveform signals and systolic blood pressure reading at the level of bilateral brachial, anterior tibial, and posterior tibial arteries, when vessel segments are accessible. Bilateral testing is considered an integral part of a complete examination. Photoelectric Plethysmograph (PPG) waveforms and toe systolic pressure readings are included as required and additional duplex testing as needed. Limited examinations for reoccurring indications may be performed as noted.  ABI Findings: +---------+------------------+-----+---------+----------------+ Right    Rt Pressure (mmHg)IndexWaveform Comment          +---------+------------------+-----+---------+----------------+ Brachial 118                    triphasic                 +---------+------------------+-----+---------+----------------+ PTA      164               1.39 triphasic                 +---------+------------------+-----+---------+----------------+ DP       255               2.16 triphasicNon-compressible +---------+------------------+-----+---------+----------------+ Great Toe34                0.29  Abnormal                  +---------+------------------+-----+---------+----------------+ +---------+------------------+-----+---------+---------------------------------+  Left     Lt Pressure (mmHg)IndexWaveform Comment                           +---------+------------------+-----+---------+---------------------------------+ Brachial                        triphasicNot pressure obtained due to IV                                            placement                         +---------+------------------+-----+---------+---------------------------------+ PTA      156               1.32 triphasic                                  +---------+------------------+-----+---------+---------------------------------+ DP       118               1.00 triphasic                                  +---------+------------------+-----+---------+---------------------------------+ Great Toe52                0.44 Abnormal                                   +---------+------------------+-----+---------+---------------------------------+ +-------+----------------+-----------+------------+----------------+ ABI/TBIToday's ABI     Today's TBIPrevious ABIPrevious TBI     +-------+----------------+-----------+------------+----------------+ Right  Non-compressible0.29       0.91        Non-compressible +-------+----------------+-----------+------------+----------------+ Left   1.32            0.44       1.01        Non-compressible +-------+----------------+-----------+------------+----------------+  Summary: Right: Resting right ankle-brachial index indicates noncompressible right lower extremity arteries. The right toe-brachial index is abnormal. Left: Resting left ankle-brachial index is within normal range. The left toe-brachial index is abnormal. *See table(s) above for measurements and observations.  Electronically signed by Gerarda Fraction on 06/11/2023 at 7:45:32 PM.    Final         Assessment/Plan: Diagnosis: Closed left hip fracture Does the need for close, 24 hr/day medical supervision in concert with the patient's rehab needs make it unreasonable for this patient to be served in a less intensive setting? 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 Yes Does the patient require coordinated care of a physician, rehab nurse, therapy disciplines of PT, OT to address physical and functional deficits in the context of the above medical diagnosis(es)? Yes Addressing deficits in the following areas: balance, endurance, locomotion, strength, transferring, bowel/bladder control, bathing, dressing, feeding, grooming, toileting, and psychosocial support Can the patient actively participate in  an intensive therapy program of at least 3 hrs of therapy per day at least 5 days per week? Yes The potential for patient to make measurable gains while on inpatient rehab is excellent Anticipated functional outcomes upon discharge from inpatient rehab are supervision with PT and OT Estimated rehab length of stay to reach the above functional goals is: 10-14 days Anticipated discharge destination: Home Overall Rehab/Functional Prognosis: excellent   POST ACUTE RECOMMENDATIONS: This patient's condition is appropriate for continued rehabilitative care in the following setting: CIR Patient has agreed to participate in recommended program. Yes Note that insurance prior authorization may be required for  reimbursement for recommended care.       I have personally performed a face to face diagnostic evaluation of this patient. Additionally, I have examined the patient's medical record including any pertinent labs and radiographic images. If the physician assistant has documented in this note, I have reviewed and edited or otherwise concur with the physician assistant's documentation.   Thanks,   Horton Chin, MD 06/13/2023          Routing History

## 2023-06-17 NOTE — Progress Notes (Signed)
PROGRESS NOTE   Subjective/Complaints:   Pt reports feeling better- did get behind on pain meds last night-  but new dose of pain meds working better overall.  Starting to want to love LLE.  Also felt was in "hole in bed"  all night.   LBM 2 days ago- normal for him.  Thinks he can go today.    ROS:  Pt denies SOB, abd pain, CP, N/V/C/D, and vision changes Except for HPI  Objective:   No results found. Recent Labs    06/15/23 0125 06/16/23 1805  WBC 11.2* 13.6*  HGB 9.5* 9.2*  HCT 30.4* 29.7*  PLT 337 447*   Recent Labs    06/15/23 0125 06/16/23 1805  NA 133* 136  K 4.9 4.8  CL 98 101  CO2 24 25  GLUCOSE 340* 181*  BUN 51* 60*  CREATININE 1.58* 1.47*  CALCIUM 8.6* 8.8*    Intake/Output Summary (Last 24 hours) at 06/17/2023 1000 Last data filed at 06/17/2023 0700 Gross per 24 hour  Intake 118 ml  Output 400 ml  Net -282 ml     Pressure Injury 06/16/23 Sacrum Medial Stage 2 -  Partial thickness loss of dermis presenting as a shallow open injury with a red, pink wound bed without slough. small open area on coccyx in middle of gluteal cleft 1x1 cm (Active)  06/16/23 1848  Location: Sacrum  Location Orientation: Medial  Staging: Stage 2 -  Partial thickness loss of dermis presenting as a shallow open injury with a red, pink wound bed without slough.  Wound Description (Comments): small open area on coccyx in middle of gluteal cleft 1x1 cm  Present on Admission: Yes    Physical Exam: Vital Signs Blood pressure 132/67, pulse 68, temperature 97.6 F (36.4 C), temperature source Oral, resp. rate 18, height 6\' 2"  (1.88 m), weight 93 kg, SpO2 98%.    Afebrile- vitals reviewed General: awake, alert, appropriate, sitting up in bed; NAD HENT: conjugate gaze; oropharynx moist CV: regular rate and rhythm; no JVD Pulmonary: CTA B/L; no W/R/R- good air movement GI: soft, NT, ND, (+)BS- a little  hypoactive Psychiatric: appropriate- interactive Neurological: Ox3   Cervical back: Tenderness present.     Comments: Psoriatic arthritis changes esp in hands DIPs are overflexed/contracted R>L and somewhat of MCPs somewhat flexed at rest- has hand contractures   R shoulder has ~ 20-25 degrees of active ROM max RUE- deltoid 2-/5; otherwise 4+/5 in RUE LUE shoulder has >90 degrees and 4+/5 in LUE RLE- HF proximally 3-/5 and sitally 4/5 LLE- HF 2-/5; KE 2/5; and DF/PF 4/5- limited severely by L hip pain  Skin:    General: Skin is warm and dry.     Comments: L elbow- gooey- and looks like hasn't been changed- a lot of brown/reddish goo under dressing Lot sof L hip bruising around dressing- doesn't have original dressing in place R shin- kerlex wrapped from today A lot of UE and LE ecchymoses Not ocvere din syrup today :) Neurological:     Comments: Spina bifida changes to LE's/decreased  sensation  Assessment/Plan: 1. Functional deficits which require 3+ hours per day of interdisciplinary therapy in a  comprehensive inpatient rehab setting. Physiatrist is providing close team supervision and 24 hour management of active medical problems listed below. Physiatrist and rehab team continue to assess barriers to discharge/monitor patient progress toward functional and medical goals  Care Tool:  Bathing              Bathing assist       Upper Body Dressing/Undressing Upper body dressing        Upper body assist      Lower Body Dressing/Undressing Lower body dressing            Lower body assist       Toileting Toileting    Toileting assist       Transfers Chair/bed transfer  Transfers assist           Locomotion Ambulation   Ambulation assist              Walk 10 feet activity   Assist           Walk 50 feet activity   Assist           Walk 150 feet activity   Assist           Walk 10 feet on uneven surface   activity   Assist           Wheelchair     Assist               Wheelchair 50 feet with 2 turns activity    Assist            Wheelchair 150 feet activity     Assist          Blood pressure 132/67, pulse 68, temperature 97.6 F (36.4 C), temperature source Oral, resp. rate 18, height 6\' 2"  (1.88 m), weight 93 kg, SpO2 98%.  Medical Problem List and Plan: 1. Functional deficits secondary to  L intertrochanteric hip fx s/p IM nail WBAT             -patient may  shower             -ELOS/Goals: 10-14 days supervision             Admit to CIR  First day of evaluations- Con't CIR PT and OT 2.  Antithrombotics: -DVT/anticoagulation:  Pharmaceutical: Eliquis             -antiplatelet therapy: Plavix    3. 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  4. Mood/Behavior/Sleep: LCSW to evaluate and provide emotional support             -antipsychotic agents: Vilazodone 40 mg daily             -continue Remeron 30 mg q HS             -continue  Provigil 100 mg daily     5. Neuropsych/cognition: This patient is capable of making decisions on his own behalf.   6. Skin/Wound Care: Routine skin care checks             -sacral pressure injury; continue local care/pressure relief             -monitor surgical incision             L elbow and R lower leg wounds- local wound care 7. 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 [name]             -weight bearing at tolerated   13: Sleep apnea: CPAP at 17 nightly   14: Left rib fractures: pain control and pulmonary toilet   15: Paroxysmal atrial fib: on Eliquis, Imdur, Demadex (rate controlled no other meds at home)   16: BPH: continue Flomax   17:Psoriasis/psoriatic arthritis: his family brought his Desonide 0.05% cream and clobetasol 0.05% cream to apply to hands, elbows, knees daily as needed             -If can get Cosentyx from home, can give to him- 2 weeks late, will make him hurt too much  8/11- educated pt don't have Cosentyx in hospital- will need ot bring form home 18: Rosacea: his family brought in his Soolantra (ivermectin) 1% cream to apply to face daily as needed   8/11- ordered by pharmacy today 19: s/p CABG 2023   20: Osteoarthritis: S/p left shoulder arthroplasty; needs the right shoulder done as well- has R frozen shoulder as a result.    21. 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    8/11- BUN up to 60- very dry- and Cr up some- will give IVFs 60cc/hour after therapy til tomorrow and recheck labs in AM  22. - Walked, but has Spina bifida with frequent falls- will d/w therapy.    23. Leukocytosis  8/11- afebrile- feel good- will recheck  in AM- if doesn't improve, will check U/A and Cx and CXR- having nurse check wounds as well- just to make sure-     I spent a total of  52  minutes on total care today- >50% coordination of care- due to  D/w pharmacy about creams; also cosentyx and d/w nursing about labs as well as wounds-  And leukocytosis.labs in AM and issues with IVFs, needed due to BUN up to 60.      LOS: 1 days  A FACE TO FACE EVALUATION WAS PERFORMED    06/17/2023, 10:00 AM

## 2023-06-17 NOTE — Progress Notes (Signed)
Hypoglycemic Event  CBG: 59  Treatment: D50 25 mL (12.5 gm)  Symptoms: Shaky  Follow-up CBG: Time:2215 CBG Result:87  Possible Reasons for Event: Medication regimen: Novolog or Semglee   Comments/MD notified: none.     Melvenia Beam

## 2023-06-18 ENCOUNTER — Inpatient Hospital Stay (HOSPITAL_COMMUNITY): Payer: Commercial Managed Care - PPO

## 2023-06-18 DIAGNOSIS — S72002A Fracture of unspecified part of neck of left femur, initial encounter for closed fracture: Secondary | ICD-10-CM | POA: Diagnosis not present

## 2023-06-18 LAB — URINALYSIS, W/ REFLEX TO CULTURE (INFECTION SUSPECTED)
Bacteria, UA: NONE SEEN
Bilirubin Urine: NEGATIVE
Glucose, UA: NEGATIVE mg/dL
Hgb urine dipstick: NEGATIVE
Ketones, ur: NEGATIVE mg/dL
Leukocytes,Ua: NEGATIVE
Nitrite: NEGATIVE
Protein, ur: NEGATIVE mg/dL
Specific Gravity, Urine: 1.014 (ref 1.005–1.030)
pH: 5 (ref 5.0–8.0)

## 2023-06-18 LAB — GLUCOSE, CAPILLARY
Glucose-Capillary: 174 mg/dL — ABNORMAL HIGH (ref 70–99)
Glucose-Capillary: 212 mg/dL — ABNORMAL HIGH (ref 70–99)
Glucose-Capillary: 248 mg/dL — ABNORMAL HIGH (ref 70–99)
Glucose-Capillary: 253 mg/dL — ABNORMAL HIGH (ref 70–99)
Glucose-Capillary: 265 mg/dL — ABNORMAL HIGH (ref 70–99)
Glucose-Capillary: 84 mg/dL (ref 70–99)

## 2023-06-18 LAB — SODIUM, URINE, RANDOM: Sodium, Ur: 17 mmol/L

## 2023-06-18 LAB — OSMOLALITY: Osmolality: 315 mOsm/kg — ABNORMAL HIGH (ref 275–295)

## 2023-06-18 LAB — BASIC METABOLIC PANEL
Anion gap: 11 (ref 5–15)
BUN: 83 mg/dL — ABNORMAL HIGH (ref 8–23)
CO2: 21 mmol/L — ABNORMAL LOW (ref 22–32)
Calcium: 8.3 mg/dL — ABNORMAL LOW (ref 8.9–10.3)
Chloride: 99 mmol/L (ref 98–111)
Creatinine, Ser: 1.95 mg/dL — ABNORMAL HIGH (ref 0.61–1.24)
GFR, Estimated: 35 mL/min — ABNORMAL LOW (ref >60)
Glucose, Bld: 270 mg/dL — ABNORMAL HIGH (ref 70–99)
Potassium: 4.6 mmol/L (ref 3.5–5.1)
Sodium: 131 mmol/L — ABNORMAL LOW (ref 135–145)

## 2023-06-18 LAB — OSMOLALITY, URINE: Osmolality, Ur: 432 mOsm/kg (ref 300–900)

## 2023-06-18 MED ORDER — PIPERACILLIN-TAZOBACTAM 3.375 G IVPB 30 MIN
3.3750 g | Freq: Four times a day (QID) | INTRAVENOUS | Status: AC
  Administered 2023-06-18 – 2023-06-23 (×20): 3.375 g via INTRAVENOUS
  Filled 2023-06-18 (×20): qty 50

## 2023-06-18 MED ORDER — INSULIN GLARGINE-YFGN 100 UNIT/ML ~~LOC~~ SOLN
25.0000 [IU] | Freq: Two times a day (BID) | SUBCUTANEOUS | Status: DC
  Administered 2023-06-18 – 2023-06-23 (×10): 25 [IU] via SUBCUTANEOUS
  Filled 2023-06-18 (×11): qty 0.25

## 2023-06-18 MED ORDER — SODIUM CHLORIDE 0.9 % IV SOLN: INTRAVENOUS | Status: AC

## 2023-06-18 MED ORDER — PIPERACILLIN-TAZOBACTAM 4.5 G IVPB: 4.5000 g | Freq: Three times a day (TID) | INTRAVENOUS | Status: DC

## 2023-06-18 NOTE — Progress Notes (Signed)
Met with patient. Endorses daily weights at home, takes insulin at home, wears CPAP at home.  Cpap at bedside and order in for use here. WBAT to LLE. Discussed fall risk at home, reports all falls are not related to anything that just accidents. Discussed being more aware of surroundings when doing things.  Collected clean catch U/A, nurse notified. Discussed diet with A1c of 8.5 and improving HDL of 24.  Updated board for wound care, medications, and diet modifications. All needs met, call bell in reach.

## 2023-06-18 NOTE — Progress Notes (Signed)
Inpatient Rehabilitation  Patient information reviewed and entered into eRehab system by Melissa M. Bowie, M.A., CCC/SLP, PPS Coordinator.  Information including medical coding, functional ability and quality indicators will be reviewed and updated through discharge.    

## 2023-06-18 NOTE — Progress Notes (Signed)
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 Clifton James and Dr. Delia Chimes   SLEEP APNEA    SPINA BIFIDA    Past Surgical History:  Past Surgical History:  Procedure Laterality Date   BACK SURGERY     CARPAL TUNNEL RELEASE Left 08/21/2019   Procedure: CARPAL TUNNEL RELEASE;  Surgeon: Cindee Salt, MD;  Location: [facility];  Service: Orthopedics;  Laterality: Left;  AXILLARY BLOCK   CORONARY ARTERY BYPASS GRAFT     INTRAMEDULLARY (IM) NAIL INTERTROCHANTERIC Left 06/10/2023   Procedure: INTRAMEDULLARY (IM) NAIL INTERTROCHANTERIC;  Surgeon: Teryl Lucy, MD;  Location: [facility] OR;  Service: Orthopedics;  Laterality: Left;   INTRAOPERATIVE TRANSTHORACIC ECHOCARDIOGRAM N/A 02/21/2023   Procedure: INTRAOPERATIVE TRANSTHORACIC ECHOCARDIOGRAM;  Surgeon: Kathleene Hazel, MD;  Location: [facility];  Service: Open Heart Surgery;  Laterality: N/A;   JOINT REPLACEMENT Left    shoulder   LEFT HEART CATH AND CORS/GRAFTS ANGIOGRAPHY N/A 06/18/2018   Procedure: LEFT HEART CATH AND CORS/GRAFTS ANGIOGRAPHY;  Surgeon: Runell Gess, MD;  Location: [facility];  Service: Cardiovascular;  Laterality: N/A;   RIGHT/LEFT HEART CATH AND CORONARY/GRAFT ANGIOGRAPHY N/A 01/11/2023   Procedure: RIGHT/LEFT HEART CATH AND CORONARY/GRAFT ANGIOGRAPHY;  Surgeon: Kathleene Hazel, MD;  Location: [facility];  Service: Cardiovascular;  Laterality: N/A;   TRANSCATHETER AORTIC VALVE REPLACEMENT, TRANSFEMORAL Left 02/21/2023   Procedure: Transcatheter Aortic Valve Replacement, Transfemoral;  Surgeon: Kathleene Hazel, MD;  Location: [facility];  Service: Open Heart Surgery;  Laterality: Left;   TRIGGER FINGER RELEASE Left 08/21/2019   Procedure: RELEASE TRIGGER LEFT SMALL FINGER LEFT INDEX;  Surgeon: Cindee Salt, MD;  Location: [facility];  Service: Orthopedics;  Laterality: Left;   ULNAR NERVE TRANSPOSITION Left 08/21/2019   Procedure: DECOMPRESSION WITH ULNAR NERVE LEFT CUBITAL TUNNEL ULNAR;  Surgeon: Cindee Salt, MD;  Location:  SURGERY CENTER;  Service: Orthopedics;  Laterality: Left;   Social History:  reports that he has never smoked. He has never used smokeless tobacco. He reports that he does not currently use alcohol. He reports that he does not use drugs.  Family / Support Systems Marital Status: Married Patient Roles: Spouse, Parent Spouse/Significant Other: Erskine Squibb [phone] Children: Beth-daughter  [phone]  Adam-son [phone] Other Supports: Friends Anticipated Caregiver: Wife and children Ability/Limitations of Caregiver: Wife recently fractured her wrist and works still. Aware will need 24/7 supervision at DC Caregiver Availability: Other (Comment) (Needs to come up with a plan for pt at DC) Family Dynamics: Close with family and they will work on plan for discharge. Pt was at his first OP appointment when he fell and fractured his hip.  Social History Preferred language: English Religion: Non-Denominational Cultural Background: No issues Education: HS Health Literacy - How often do  you need to have someone help you when you read instructions, pamphlets, or other written material from your doctor or pharmacy?: Never Writes: Yes Employment Status: Retired Marine scientist Issues:  Fell at [employer] at [employer], fractured his hip Guardian/Conservator: None-according to MD pt is capable of making his own decisions while here.   Abuse/Neglect Abuse/Neglect Assessment Can Be Completed: Yes Physical Abuse: Denies Verbal Abuse: Denies Sexual Abuse: Denies Exploitation of patient/patient's resources: Denies Self-Neglect: Denies  Patient response to: Social Isolation - How often do you feel lonely or isolated from those around you?: Never  Emotional Status Pt's affect, behavior and adjustment status: Pt is motivated to get moving, but seems to be a slow starter according to him. He feels he has been laying in a bed and not doing well. He was doing well after his surgery in April and recovered well until this fall. Recent Psychosocial Issues: recent surgery in April was doing well Psychiatric History: No history seems to be somewhat confused today and not asnwering the questions this worker had for him. Unsure if used CPAP last night Substance Abuse History: No issues  Patient / Family Perceptions, Expectations & Goals Pt/Family understanding of illness & functional limitations: Pt and wife can explain his hip fracture both have spoken with the MD and feel they understand of his precautions and issues. Pt hopes to do well here. Premorbid pt/family roles/activities: husband, father, retiree, friend Anticipated changes in roles/activities/participation: resume Pt/family expectations/goals: Pt states: " I need to get moving been sitting in this bed for too long."  Wife states: " I fractured my wrist so am out of work right now."  [employer]: Other (Comment) Premorbid Home Care/DME Agencies: Other (Comment) (cane was at OP PT when fell) Transportation available at discharge: wife Is the patient able to respond to transportation needs?: Yes In the past 12 months, has lack of transportation kept you from medical appointments or from getting medications?:  No In the past 12 months, has lack of transportation kept you from meetings, work, or from getting things needed for daily living?: No  Discharge Planning Living Arrangements: Spouse/significant other Support Systems: Spouse/significant other, Children, Friends/neighbors Type of Residence: Private residence Insurance Resources: Electrical engineer Resources: Tree surgeon, Family Support Financial Screen Referred: No Living Expenses: Lives with family Money Management: Patient, Spouse Does the patient have any problems obtaining your medications?: No Home Management: both wife and pt Patient/Family Preliminary Plans: Return home with wife who recently fractured her wrist and is out of work herself. Made aware pt will need 24/7 supervision at discharge so family will need to work on this. Aware team conference tomorrow will call and update wife tomorrow. Care Coordinator Anticipated Follow Up Needs: HH/OP  Clinical Impression Pleasant but somewhat confused gentleman who is ready to get out of bed and work with therapies. His wife has her own health issues she is working on. Will work on discharge needs and update after team conference tomorrow.  Lucy Chris 06/18/2023, 10:43 AM

## 2023-06-18 NOTE — Progress Notes (Signed)
Patient has home CPAP unit at bedside.  RT assistance not needed at this time.

## 2023-06-18 NOTE — Progress Notes (Signed)
Placed consult to [facility] this morning regarding hyperkalemia, history of pAf with abnormal EKG, work-up of possible UTI. Patient follows with Dr. Wyline Mood.

## 2023-06-18 NOTE — Progress Notes (Signed)
Pt has home CPAP unit at bedside, pt places self on and off when ready.

## 2023-06-18 NOTE — Discharge Summary (Signed)
Physician Discharge Summary  Patient ID: Dylan Bruce MRN: [id number] DOB/AGE: July 26, 1946 77 y.o.  Admit date: 06/16/2023 Discharge date: 07/10/2023  Discharge Diagnoses:  Principal Problem:   Closed left hip fracture, initial encounter [facility]) Active Problems:   Spina bifida (HCC)   S/p left hip fracture   Adjustment disorder   Hyperkalemia Multiple skin abrasions Chronic back pain Sacral pressure injury Hypertension Hyperlipidemia Diabetes mellitus, insulin requiring Bilateral lower extremity venous stasis Sleep apnea Left rib fractures Paroxysmal atrial fibrillation 9 prostatic hypertrophy Psoriasis Rosacea Osteoarthritis Chronic kidney disease stage IIIa Spina bifida with frequent falls Covid positive  Discharged Condition: stable  Significant Diagnostic Studies:  Narrative & Impression  CLINICAL DATA:  COVID positive, cough.   EXAM: PORTABLE CHEST 1 VIEW   COMPARISON:  Chest radiograph dated 07/06/2023.   FINDINGS: The heart is enlarged. Vascular calcifications are seen in the aortic arch. Mild diffuse bilateral interstitial opacities are noted. No pleural effusion or pneumothorax. Degenerative changes are seen in the spine.   IMPRESSION: Mild diffuse bilateral interstitial opacities may reflect pulmonary edema or atypical pneumonia.     Electronically Signed   By: Romona Curls M.D.   On: 07/08/2023 14:35    Narrative & Impression  CLINICAL DATA:  Pulmonary vascular congestion   EXAM: PORTABLE CHEST 1 VIEW   COMPARISON:  07/03/2023   FINDINGS: Increased interstitial markings, raising the possibility of mild interstitial edema. However, the mildly focal/patchy appearance in the right mid lung raises the possibility of mild infection/pneumonia. No pleural effusion or pneumothorax.   Skin fold along the right lung apex and lateral left hemithorax.   Cardiomegaly. 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   Median  sternotomy.   IMPRESSION: Cardiomegaly with possible mild edema. However, pneumonia is also possible, particularly in the right mid lung.   No pleural effusions.     Electronically Signed   By: Charline Bills M.D.   On: 07/06/2023 08:01      Narrative & Impression  CLINICAL DATA:  Anterior chest pain   EXAM: CHEST - 2 VIEW   COMPARISON:  06/20/2023   FINDINGS: Frontal and lateral views of the chest are obtained on 3 images. 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 The cardiac silhouette is enlarged. There is increased vascular congestion with developing basilar predominant interstitial and ground-glass opacities. Trace bilateral effusions. No pneumothorax. Stable left shoulder arthroplasty.   IMPRESSION: 1. Worsening volume status, with progressive congestive heart failure.     Electronically Signed   By: Sharlet Salina M.D.   On: 07/03/2023 20:12    Narrative & Impression  CLINICAL DATA:  Left hip fracture follow-up after pinning.   EXAM: DG HIP (WITH OR WITHOUT PELVIS) 2-3V LEFT   COMPARISON:  Left hip radiographs 06/10/2023, 06/09/2023; lumbar spine radiographs 02/19/2020   FINDINGS: Redemonstration of left proximal femoral cephalomedullary nail fixation. Unchanged anatomic alignment.   Mild-to-moderate bilateral femoroacetabular joint space narrowing. Mild bilateral sacroiliac subchondral sclerosis.   L5-S1 posterior element fixation hardware with a screw overlying the left posterior elements, similar to 02/19/2020 lumbar spine radiographs.   IMPRESSION: Redemonstration of left proximal femoral cephalomedullary nail fracture fixation. Unchanged anatomic alignment.     Electronically Signed   By: Neita Garnet M.D.   On: 06/25/2023 17:09        Narrative & Impression  CLINICAL DATA:  Shortness of breath   EXAM: CHEST - 2 VIEW   COMPARISON:  06/18/2023   FINDINGS: Unchanged  mild  cardiomegaly. Interval improvement of mild pulmonary vascular congestion. Postsurgical changes of aortic valve replacement, CABG and left shoulder replacement again seen.   Interval improvement in aeration of the lungs with minimal opacity still remaining at the right lung base.   IMPRESSION: Interval improvement in aeration of the lungs with minimal opacity still remaining at the right lung base most likely due to resolving pneumonia or pulmonary edema.     Electronically Signed   By: Acquanetta Belling M.D.   On: 06/20/2023 15:59    Narrative & Impression  CLINICAL DATA:  Leukocytosis   EXAM: PORTABLE CHEST 1 VIEW   COMPARISON:  X-ray 06/09/2023   FINDINGS: Enlarged cardiopericardial silhouette with tortuous ectatic aorta. Sternal wires. Underinflation with vascular congestion. There is more focal opacity developing in the right lung base. An acute infiltrate is possible. No pneumothorax or effusion. Left shoulder arthroplasty   IMPRESSION: Underinflation with enlarged heart and vascular congestion.   Developing opacity in the right lung base. Subtle infiltrate is possible. Recommend follow-up     Electronically Signed   By: Karen Kays M.D.   On: 06/18/2023 13:17     Labs:  Basic Metabolic Panel: Recent Labs  Lab 07/06/23 0719 07/07/23 0640 07/07/23 0936 07/07/23 1438 07/08/23 0648 07/09/23 0545  NA 137 136 135 135 137 135  K 5.6* 6.2* 5.6* 5.8* 5.2* 5.5*  CL 104 103 102 100 103 104  CO2 23 23 23 22 22  18*  GLUCOSE 113* 156* 147* 158* 148* 106*  BUN 37* 42* 43* 49* 48* 51*  CREATININE 1.30* 1.42* 1.42* 1.64* 1.44* 1.44*  CALCIUM 9.1 8.9 9.1 8.7* 8.8* 8.9  MG  --   --   --  2.2  --   --     CBC: Recent Labs  Lab 07/07/23 0640 07/07/23 1438 07/09/23 0545  WBC 9.3 9.5 7.4  NEUTROABS 6.8 7.3 5.0  HGB 8.8* 8.6* 8.6*  HCT 28.9* 28.0* 27.7*  MCV 81.4 80.2 83.4  PLT 328 310 338    CBG: Recent Labs  Lab 07/09/23 0554 07/09/23 1224  07/09/23 1630 07/09/23 2036 07/10/23 0642  GLUCAP 119* 176* 130* 145* 147*    Brief HPI:   Dylan DISHAW is a 77 y.o. male who fell and initial x-rays at [facility] negative for fracture. Returned to ED on 8/03 and CT revealed intertrochanteric left hip fracture. Also, imaging significant for left rib fracture. Transferred to [facility] for definitive care. Underwent left IM nail by Dr [name] on 8/04. History of PAF: has consistently been in 4:1 atrial flutter. Normal LVEF. WBAT. Tolerating diet. WOC RN consulted for sacral pressure injury and venous stasis ulcer. Has chronic pain (see below) and is upset the last day or two with how his pain is being managed and the length of time it takes to have his pain medication brought to him. Reports decreased strength and mobility in right shoulder.   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 S/p left shoulder arthroplasty. He has history of chronic back pain and takes approximately 4 Percocet 10/325 daily (Dr. Lindwood Qua), Lyrica 100 mg BID, tizanidine 4 mg at bedtime and cyclobenzaprine 10 mg at bedtime.   Hospital Course: FARRON YEARGIN was admitted to rehab 06/16/2023 for inpatient therapies to consist of PT, ST and OT at least three hours five days a week. Past admission physiatrist,  therapy team and rehab RN have worked together to provide customized collaborative inpatient rehab.  Follow-up labs on 8/10 and revealed elevated BUN to 60, serum creatinine 1.47, mild elevation of AST and ALT, white blood cell count 13.6, H&H 9.2 and 29.7 respectively, platelet count 447,000.  Started IV fluids 60 cc/h.  Patient states he was to receive Cosentyx for psoriatic arthritis 2 weeks ago.  The Oxy IR was changed on admission to rehab to 20 mg every 4 hours since his home dose was Percocet 10/325 2 tablets 4 times daily.   Initiated Nambutone 750 mg twice daily and holding increasing dose secondary to CKD.  At hypoglycemic event with blood sugar to the 50s overnight 8/12.  Follow-up labs significant for sodium of 132, potassium 5.2, white blood cell count 17.9 and platelet count 454,000.  Concern for UTI 8/12 and urinalysis obtained.  EKG performed with atrial flutter and heart rate of 54.  No obvious peaked T waves. CXR possible RLL aspiration pneumonia. IVF started. Started IV Zosyn 4.75 mg Q8H for 5 days, SLP consult placed for swallow eval. Urinalysis normal, urine sodium and serum osmolality appear prerenal. WBC count down 8/13 to 10.7 and down further to 9.0 on 8/14. Treatment for anal fissure with home cream as patient informed of condition. 8/19: K+ 5.2 and Lokelma ordered. BUN and creatinine improved to 34 and 1.24. Surgical staples removed. Downgraded to 15/7 therapies 8/20. Serum potassium 5.3 on 8/20 and Lokelma given in addition to IVFs. Remained elevated and consulted nephrology 8/21. PO water encouraged.  Neuropsychology consulted 8/21. Patient took Benadryl 50 mg from home and developed confusion. Removed from patient and started on Buspar 5 mg TID for anxiety. Vistaril ordered. IVFs discontinued on 8/23. Leukocytosis resolved.  Reports improvement in anxiety with Buspar. Left chest wall pain reported 8/27 and chest x-ray updated. Oral iron decreased to daily dosing. Follow-up labs on 8/31 with serum potassium 6.2. Stat EKG showed some mild ST changes in the lateral leads.  Discussed with cardiology, to treat patient acutely for hyperkalemia and feel changes are mild.  Repeat BMP with K+ of 5.6, increase in creatinine to 1.64, BUN to 49, stable H and H and normal WBC count. Repeated again and serum potassium 5.8. Given IVFs. Repeated again on 9/01 and BUN/Cr now 48/1.44. Fever to 100.6 with report of cough and respiratory symptoms per nursing. Subjective fever and chills. No SOB or O2 requirement. Covid test positive.  9/01-compared to prior images, right shin wound with more skin loss but appears healthy beefy red tissue underneath; continue management with Xeroform. Afebrile and cough improved 9/03.   Blood pressures were monitored on TID basis and Imdur 30 mg daily and Demadex 20 mg daily continued.  Diabetes has been monitored with ac/hs CBG checks and SSI was use prn for tighter BS control.  NovoLog 12 units with meals and Semglee 25 units twice daily continued.  Increased Semglee to 28 units twice daily on 8/11. Reduced back to 25 units on 8/12 and reduced further to 22 units on 8/17. Tradjenta 5 mg daily started 8/20. Discontinued 8/29. Decreased Semglee to 20 units BID on 8/29. Restarted Jardiance and metformin. 9/01: Hypoglycemic overnight 46, came up into the 80s with orange juice, adjust Premeal insulin standing from 12 units to 8 units as it may be over coverage with resistant sliding scale. Recommend not restarting Ozempic at home until further outpatient follow-up.  Rehab course: During patient's stay in rehab weekly team conferences were held to monitor  patient's progress, set goals and discuss barriers to discharge. At admission, patient required total with mobility and with basic self care-skills.   He has had improvement in activity tolerance, balance, postural control as well as ability to compensate for deficits. He has had improvement in functional use RUE/LUE  and RLE/LLE as well as improvement in awareness. Pt received seated in tilt in space WC and agrees to therapy. Reports 7/10 pain in Lt hip. PT provides rest breaks and mobility to manage pain. Pt performs sit to stand with CGA/minA to facilitate anterior weight shift. Pt then compltees marching place to warm up, progressing to alternating foot taps on 8 inch step with CGA and cues for posture and body mechanics. Pt completes x5 with each foot prior to requiring seated rest break. Pt completes x2 additional sets with seatedc rest breaks, with  cues at hips and trunk for posture and stability. Following rest break, pt completes sit to stand with CGA and ambulates in room, making multiple 180 degree turns, for total of x80'. Following rest break pt completes additional bout of ambulation, x80' with similar assistance and cues.   Home health PT and OT arranged through [facility].    Discharge disposition: 06-Home-Health Care Svc     Diet: carb modified  Special Instructions: No driving, alcohol consumption or tobacco use.  Weight bear as tolerated.  Recommend checking fingerstick blood sugars four (4) times daily and record. Bring this information with you to follow-up appointment with PCP.  Recommend daily BP measurement in same arm and record time of day. Bring this information with you to follow-up appointment with PCP.  Follow-up in one week with PCP to check serum potassium and leg wound.  30-35 minutes were spent on discharge planning and discharge summary.  Discharge Instructions     Ambulatory referral to Neurology   Complete by: As directed    An appointment is requested in approximately: 2 weeks. Referral requested per Dr. Kieth Brightly. Hx of sleep apnea with CPAP. Daytime drowsiness with recent fall and femur fracture.   Ambulatory referral to Physical Medicine Rehab   Complete by: As directed    Hospital follow-up (sent referral with wrong MD>>Raulkar, please disregard that)   Discharge patient   Complete by: As directed    Discharge disposition: 06-Home-Health Care Svc   Discharge patient date: 07/10/2023      Allergies as of 07/10/2023       Reactions   Cefuroxime Diarrhea, Nausea And Vomiting   Severe. Can take Ancef 2g IV on 02/21/2023 without reporting any issue   Ibuprofen Anaphylaxis, Shortness Of Breath      Iodinated Contrast Media Shortness Of Breath, Other (See Comments)   Pt developed burning of face and SOB after 02/06/2023 CT scan    Cefpodoxime Rash   Ciprofloxacin Rash   Doxycycline Rash    Sulfamethoxazole-trimethoprim Rash   Fluocinolone Other (See Comments)   Unknown reaction   Probenecid Other (See Comments)   Unknown reaction   Allopurinol Rash   Azithromycin Rash   Sulfa Antibiotics Rash        Medication List     STOP taking these medications    cyclobenzaprine 10 MG tablet Commonly known as: FLEXERIL   docusate sodium 100 MG capsule Commonly known as: COLACE   fenofibrate 145 MG tablet Commonly known as: TRICOR   magnesium oxide 400 (240 Mg) MG tablet Commonly known as: MAG-OX   meclizine 12.5 MG tablet Commonly known as: ANTIVERT   Ozempic (1 MG/DOSE)  4 MG/3ML Sopn Generic drug: Semaglutide (1 MG/DOSE)   tiZANidine 4 MG tablet Commonly known as: ZANAFLEX       TAKE these medications    albuterol 108 (90 Base) MCG/ACT inhaler Commonly known as: VENTOLIN HFA Inhale 2 puffs into the lungs every 4 (four) hours as needed for wheezing.   Anucort-HC 25 MG suppository Generic drug: hydrocortisone Place 1 suppository rectally 2 (two) times daily as needed for hemorrhoids.   apixaban 5 MG Tabs tablet Commonly known as: ELIQUIS Take 1 tablet (5 mg total) by mouth 2 (two) times daily.   atorvastatin 80 MG tablet Commonly known as: Lipitor Take 1 tablet (80 mg total) by mouth daily.   budesonide-formoterol 160-4.5 MCG/ACT inhaler Commonly known as: SYMBICORT Inhale 2 puffs into the lungs in the morning and at bedtime.   busPIRone 15 MG tablet Commonly known as: BUSPAR Take 1 tablet (15 mg total) by mouth 3 (three) times daily.   clobetasol cream 0.05 % Commonly known as: TEMOVATE Apply 1 application topically daily as needed (rash).   clopidogrel 75 MG tablet Commonly known as: PLAVIX Take 1 tablet (75 mg total) by mouth daily.   Cosentyx Sensoready (300 MG) 150 MG/ML Soaj Generic drug: Secukinumab (300 MG Dose) Inject 300 mg into the skin every 30 (thirty) days.   desonide 0.05 % cream Commonly known as: DESOWEN Apply 1  application topically daily as needed (rash).   esomeprazole 40 MG capsule Commonly known as: NEXIUM Take 40 mg by mouth daily before breakfast.   ezetimibe 10 MG tablet Commonly known as: ZETIA Take 10 mg by mouth daily.   ferrous sulfate 325 (65 FE) MG tablet Take 1 tablet (325 mg total) by mouth 3 (three) times daily after meals.   fluticasone 50 MCG/ACT nasal spray Commonly known as: FLONASE Place 1 spray into both nostrils in the morning and at bedtime.   guaiFENesin-dextromethorphan 100-10 MG/5ML syrup Commonly known as: ROBITUSSIN DM Take 10 mLs by mouth every 6 (six) hours as needed for cough.   hydrOXYzine 25 MG capsule Commonly known as: VISTARIL Take 25 mg by mouth daily as needed for itching (sleep).   insulin lispro 100 UNIT/ML KiwkPen Commonly known as: HUMALOG Inject 0.08 mLs (8 Units total) into the skin 3 (three) times daily with meals. What changed: how much to take   isosorbide mononitrate 30 MG 24 hr tablet Commonly known as: IMDUR Take 1 tablet (30 mg total) by mouth daily. Please schedule appointment for refills.   Jardiance 10 MG Tabs tablet Generic drug: empagliflozin Take 10 mg by mouth daily.   Lantus SoloStar 100 UNIT/ML Solostar Pen Generic drug: insulin glargine Inject 20 Units into the skin 2 (two) times daily. What changed:  how much to take when to take this   Lyrica 100 MG capsule Generic drug: pregabalin Take 1 capsule (100 mg total) by mouth 2 (two) times daily.   metFORMIN 1000 MG tablet Commonly known as: GLUCOPHAGE Take 1,000 mg by mouth 2 (two) times daily.   mirtazapine 30 MG tablet Commonly known as: REMERON Take 1 tablet by mouth at bedtime.   modafinil 100 MG tablet Commonly known as: PROVIGIL Take 1 tablet (100 mg total) by mouth daily.   multivitamin with minerals Tabs tablet Take 1 tablet by mouth at bedtime.   nitroGLYCERIN 0.4 MG SL tablet Commonly known as: NITROSTAT Place 1 tablet (0.4 mg total) under  the tongue every 5 (five) minutes as needed for chest pain. What changed:  when  to take this reasons to take this   nystatin cream Commonly known as: MYCOSTATIN Apply 1 Application topically 2 (two) times daily.   Percocet 10-325 MG tablet Generic drug: oxyCODONE-acetaminophen Take 1-2 tablets by mouth 2 (two) times daily as needed for pain.   senna-docusate 8.6-50 MG tablet Commonly known as: Senokot-S Take 2 tablets by mouth 2 (two) times daily.   sodium zirconium cyclosilicate 5 g packet Commonly known as: LOKELMA Take 5 g by mouth daily. Start taking on: July 11, 2023   Soolantra 1 % Crea Generic drug: Ivermectin Apply 1 application  topically daily.   tamsulosin 0.4 MG Caps capsule Commonly known as: FLOMAX Take 0.4 capsules by mouth daily.   torsemide 20 MG tablet Commonly known as: DEMADEX Take 20 mg by mouth daily.   triamcinolone cream 0.1 % Commonly known as: KENALOG Apply 1 Application topically 2 (two) times daily as needed (for itching).   Uloric 40 MG tablet Generic drug: febuxostat Take 1 tablet by mouth Daily.   Viibryd 40 MG Tabs Generic drug: Vilazodone HCl Take 40 mg by mouth Daily.               Durable Medical Equipment  (From admission, onward)           Start     Ordered   06/20/23 1618  For home use only DME Air overlay mattress  Once        06/20/23 1617            Follow-up Information     Lindwood Qua, MD Follow up.   Specialty: Internal Medicine Why: Call the office in 1-2 days to make arrangements for hospital follow-up appointment in one week. Need blood work for elevated serum potassium. Follow-up lower leg wound. Contact information: [address] [place] [address] [phone]         Huston Foley, MD Follow up.   Specialties: Neurology, Radiology Why: office will call you to arrange your appt (sent) Contact information: [address] [place]  [address] [phone]         [name], MD Follow up.   Specialty: Physical Medicine and Rehabilitation Why: office will call you to arrange your appt (sent) Contact information: [address] [place] [address] [phone]         Teryl Lucy, MD Follow up.   Specialty: Orthopedic Surgery Why: Call the office in 1-2 days to make arrangements for hospital follow-up appointment. Contact information: [address] [place] [address] [phone]         Maisie Fus, MD. Go to.   Specialty: Cardiology Contact information: [address] [place] [address] [phone]                 Signed: Milinda Antis 07/10/2023, 10:44 AM

## 2023-06-18 NOTE — Progress Notes (Signed)
[facility]patient [facility] Individual Statement of Services  Patient Name:  Dylan Bruce  Date:  06/18/2023  Welcome to the [facility]patient [facility].  Our goal is to provide you with an individualized program based on your diagnosis and situation, designed to meet your specific needs.  With this comprehensive rehabilitation program, you will be expected to participate in at least 3 hours of rehabilitation therapies Monday-Friday, with modified therapy programming on the weekends.  Your rehabilitation program will include the following services:  Physical Therapy (PT), Occupational Therapy (OT), 24 hour per day rehabilitation nursing, Therapeutic Recreaction (TR), Neuropsychology, Care Coordinator, Rehabilitation Medicine, Nutrition Services, and Pharmacy Services  Weekly team conferences will be held on Tuesday to discuss your progress.  Your Inpatient Rehabilitation Care Coordinator will talk with you frequently to get your input and to update you on team discussions.  Team conferences with you and your family in attendance may also be held.  Expected length of stay: 18-21 days  Overall anticipated outcome: supervision with cues  Depending on your progress and recovery, your program may change. Your Inpatient Rehabilitation Care Coordinator will coordinate services and will keep you informed of any changes. Your Inpatient Rehabilitation Care Coordinator's name and contact numbers are listed  below.  The following services may also be recommended but are not provided by the Inpatient Rehabilitation Center:  Driving Evaluations Home Health Rehabiltiation Services Outpatient Rehabilitation Services    Arrangements will be made to provide these services after discharge if needed.  Arrangements include referral to agencies that provide these services.  Your insurance has been verified to be:  Medicare A & B Your primary doctor is:  Lindwood Qua  Pertinent information will be  shared with your doctor and your insurance company.  Inpatient Rehabilitation Care Coordinator:  Dossie Der, [place] [phone] or [phone]  Information discussed with and copy given to patient by: Lucy Chris, 06/18/2023, 10:30 AM

## 2023-06-18 NOTE — Progress Notes (Addendum)
PROGRESS NOTE   Subjective/Complaints:   Hypoglycemic event 50s overnight; see PA documentation. Hypotensive yesterday afternoon, otherwise vitally stable. Today significant for NA 132, K5.2, WBC 17.9, platelets greater than 400, BUN/creatinine uptrending.  EKG without obvious peaked T waves but in atrial flutter, not on rate controlling medications.  Ventricular rate normal. Patient feeling well, no concerns, complaints.  ROS:  Pt denies SOB, abd pain, CP, N/V/C/D, and vision changes Except for HPI  Objective:   No results found. Recent Labs    06/16/23 1805 06/18/23 0524  WBC 13.6* 17.5*  HGB 9.2* 8.8*  HCT 29.7* 27.9*  PLT 447* 454*   Recent Labs    06/16/23 1805 06/18/23 0524  NA 136 132*  K 4.8 5.3*  CL 101 100  CO2 25 22  GLUCOSE 181* 254*  BUN 60* 82*  CREATININE 1.47* 1.99*  CALCIUM 8.8* 8.3*    Intake/Output Summary (Last 24 hours) at 06/18/2023 0834 Last data filed at 06/18/2023 1610 Gross per 24 hour  Intake 906.31 ml  Output 600 ml  Net 306.31 ml     Pressure Injury 06/16/23 Sacrum Medial Stage 2 -  Partial thickness loss of dermis presenting as a shallow open injury with a red, pink wound bed without slough. small open area on coccyx in middle of gluteal cleft 1x1 cm (Active)  06/16/23 1848  Location: Sacrum  Location Orientation: Medial  Staging: Stage 2 -  Partial thickness loss of dermis presenting as a shallow open injury with a red, pink wound bed without slough.  Wound Description (Comments): small open area on coccyx in middle of gluteal cleft 1x1 cm  Present on Admission: Yes    Physical Exam: Vital Signs Blood pressure 129/66, pulse 69, temperature 98.5 F (36.9 C), temperature source Oral, resp. rate 18, height 6\' 2"  (1.88 m), weight 93 kg, SpO2 94%.    Afebrile- vitals reviewed General: awake, alert, appropriate, lying in bed, no acute distress HENT: conjugate gaze;  oropharynx moist CV: regular rate and rhythm; no JVD Pulmonary: CTA B/L; no W/R/R- good air movement GI: soft, NT, ND, (+)BS- a little hypoactive Psychiatric: appropriate- interactive Neurological: Ox3   Cervical back: Tenderness present.     Comments: Psoriatic arthritis changes esp in hands DIPs are overflexed/contracted R>L and somewhat of MCPs somewhat flexed at rest- has hand contractures   R shoulder has ~ 20-25 degrees of active ROM max RUE- deltoid 2-/5; otherwise 4+/5 in RUE LUE shoulder has >90 degrees and 4+/5 in LUE RLE- HF proximally 3-/5 and sitally 4/5 LLE- HF 2-/5; KE 2/5; and DF/PF 4/5-left hip pain improving   Skin:    General: Skin is warm and dry.     Comments:  Sacrum with small area of skin loss, mild blanchable erythema surrounding, no warmth, fluctuance or drainage. L elbow-small area of slough on most inferior part of wound, otherwise clean bases with minimal bleeding, mild bruising surrounding, no erythema, warmth, or edema. Moderate bruising at left hip, dressing clean, intact R shin-skin tear with small amount of bleeding, covered with Mepilex Bilateral upper extremity ecchymosis  Neurological:     Comments: Spina bifida changes to LE's/decreased  sensation   Assessment/Plan:  1. Functional deficits which require 3+ hours per day of interdisciplinary therapy in a comprehensive inpatient rehab setting. Physiatrist is providing close team supervision and 24 hour management of active medical problems listed below. Physiatrist and rehab team continue to assess barriers to discharge/monitor patient progress toward functional and medical goals  Care Tool:  Bathing              Bathing assist Assist Level: Maximal Assistance - Patient 24 - 49%     Upper Body Dressing/Undressing Upper body dressing        Upper body assist Assist Level: Maximal Assistance - Patient 25 - 49%    Lower Body Dressing/Undressing Lower body dressing             Lower body assist Assist for lower body dressing: Total Assistance - Patient < 25%     Toileting Toileting    Toileting assist Assist for toileting: Total Assistance - Patient < 25%     Transfers Chair/bed transfer  Transfers assist     Chair/bed transfer assist level: 2 Helpers     Locomotion Ambulation   Ambulation assist   Ambulation activity did not occur: Safety/medical concerns    Assistive device: Walker-rolling     Walk 10 feet activity   Assist  Walk 10 feet activity did not occur: Safety/medical concerns        Walk 50 feet activity   Assist Walk 50 feet with 2 turns activity did not occur: Safety/medical concerns         Walk 150 feet activity   Assist Walk 150 feet activity did not occur: Safety/medical concerns         Walk 10 feet on uneven surface  activity   Assist Walk 10 feet on uneven surfaces activity did not occur: Safety/medical concerns         Wheelchair     Assist Is the patient using a wheelchair?: Yes Type of Wheelchair: Manual    Wheelchair assist level: Dependent - Patient 0%      Wheelchair 50 feet with 2 turns activity    Assist        Assist Level: Dependent - Patient 0%   Wheelchair 150 feet activity     Assist      Assist Level: Dependent - Patient 0%   Blood pressure 129/66, pulse 69, temperature 98.5 F (36.9 C), temperature source Oral, resp. rate 18, height 6\' 2"  (1.88 m), weight 93 kg, SpO2 94%.  Medical Problem List and Plan: 1. Functional deficits secondary to  L intertrochanteric hip fx s/p IM nail WBAT             -patient may  shower             -ELOS/Goals: 10-14 days supervision             Admit to CIR  First day of evaluations- Con't CIR PT and OT 2.  Antithrombotics: -DVT/anticoagulation:  Pharmaceutical: Eliquis             -antiplatelet therapy: Plavix    3. 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   No complaints of pain today, continue current regimen  4. Mood/Behavior/Sleep: LCSW to evaluate and provide emotional support             -antipsychotic agents: Vilazodone 40 mg daily             -continue Remeron 30 mg q HS             -continue Provigil 100 mg daily     5. Neuropsych/cognition: This patient is capable of making decisions on his own behalf.   6. Skin/Wound Care: Routine skin care checks             -sacral pressure injury; continue local care/pressure relief             -monitor surgical incision             L elbow and R lower leg wounds- local wound care  8-12: Dressing removed and wounds observed as above; no obvious source of infection, but if no other findings would be most likely soft tissue infection from left elbow or hip..  See below.  7. 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  11: Venous stasis: RLE pre-tibial ulcer; continue local wound care   12: S/p left IM nail Dr. [name]             -weight bearing at tolerated   13: Sleep apnea: CPAP at 17 nightly   14: Left rib fractures: pain control and pulmonary toilet   15: Paroxysmal atrial fib: on Eliquis, Imdur, Demadex (rate controlled no other meds at home)   8/12: EKG with normal ventricular rate, however in atrial flutter, new diagnosis.  Not on rate controlling medications, cardiology consulted for input.. Getting IVF as below. Patient otherwise stable.   16: BPH: continue Flomax   17:Psoriasis/psoriatic arthritis: his family brought his Desonide 0.05% cream and clobetasol 0.05% cream to apply to hands, elbows, knees daily as needed             -If can get Cosentyx from home, can give to him- 2 weeks late, will make him hurt too much  8/11- educated pt don't have Cosentyx in hospital- will need ot bring form home 18:  Rosacea: his family brought in his Soolantra (ivermectin) 1% cream to apply to face daily as needed   8/11- ordered by pharmacy today 19: s/p CABG 2023   20: Osteoarthritis: S/p left shoulder arthroplasty; needs the right shoulder done as well- has R frozen shoulder as a result.    21. 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    8/11- BUN up to 60- very dry- and Cr up some- will give IVFs 60cc/hour after therapy til tomorrow and recheck labs in AM  8/12: Creatinine still increasing 1.99, along with increased potassium and downtrending sodium; increase fluid rate today to 1 L IV fluid at 75/h, urine studies pending, repeat BMP at 2 PM to ensure no further worsening -if creatinine increasing or K increasing, will consult nephrology at that time.  May need bicarbonate.  - Urinalysis  normal, urine sodium and serum osmolality appear prerenal, awaiting urine osmolality; K responsive to IVF, continue for 1L, Cr/BUN stable will repeat in AM  22. - Walked, but has Spina bifida with frequent falls- will d/w therapy.    23. Leukocytosis - worsening  8/11- afebrile- feel good- will recheck in AM- if doesn't improve, will check U/A and Cx and CXR- having nurse check wounds as well- just to make sure-   -8-12: WBCs continue uptrending, 17.5 today, along with thrombocytosis and anemia.  Ordered urinalysis with reflex to culture, chest x-ray, initiate IV fluids as above.   Urinalysis within normal limits   Chest x-ray pending - RLL infiltrate, significant from last CXR, concerning for aspiration pneumonia. Start IV Zosyn 4.75 mg Q8H for 5 days, SLP consult placed for swallow eval. Labs in AM     LOS: 2 days A FACE TO FACE EVALUATION WAS PERFORMED  Angelina Sheriff 06/18/2023, 8:34 AM    Addendum:

## 2023-06-18 NOTE — Progress Notes (Signed)
Physical Therapy Session Note  Patient Details  Name: Dylan Bruce MRN: [id number] Date of Birth: 06-19-1946  Today's Date: 06/18/2023 PT Individual Time: 1047-1200 PT Individual Time Calculation (min): 73 min   Short Term Goals: Week 1:  PT Short Term Goal 1 (Week 1): Pt will be able to perform bed mobility with mod assist PT Short Term Goal 2 (Week 1): Pt will be able to perform sit <> stands with mod assist PT Short Term Goal 3 (Week 1): Pt will be able to perform bed <> w/c transfers with mod assist PT Short Term Goal 4 (Week 1): Pt will be able to gait x 25' with min assist  Skilled Therapeutic Interventions/Progress Updates:      Therapy Documentation Precautions:  Precautions Precautions: Fall Restrictions Weight Bearing Restrictions: No LLE Weight Bearing: Weight bearing as tolerated   Pt received semi-reclined in bed and with unrated L LE and R shoulder pain, nurse notified and provided pt with repositioning for relief. Patient required increased time for initiation, cuing, rest breaks, and for completion of tasks throughout session. Utilized therapeutic use of self throughout to promote efficiency.    Pt requires mod A with supine to sit, sit to stand with RW and min A with stand pivot to TIS. Pt dependently transported for time management to ortho gym and willing to utilize Nu Step to address bilateral knee ROM and flexibility deficits.   Pt mod A with squat pivot to Nu Step and performed ~4 min at level 1 at decreased speed. Pt reported fatigue and max A with squat pivot return to w/c and transported to bedside.   Pt left seated at bedside with step stool positioned under patient's feet as leg rest inadequate fit.   Pt left seated at bedside with all needs in reach and alarm on.    Therapy/Group: Individual Therapy  Truitt Leep Truitt Leep PT, DPT  06/18/2023, 7:54 AM

## 2023-06-18 NOTE — Progress Notes (Signed)
Occupational Therapy Session Note  Patient Details  Name: Dylan Bruce MRN: [id number] Date of Birth: 01-26-1946  Today's Date: 06/18/2023 OT Individual Time: 0900-1000 & 1415-1537 OT Individual Time Calculation (min): 60 min & 82 min   Short Term Goals: Week 1:  OT Short Term Goal 1 (Week 1): Pt will sit EOB while completing ADLs with Max A OT Short Term Goal 2 (Week 1): Pt will complete LB dressing with Max A with AE as necessary OT Short Term Goal 3 (Week 1): Pt will complete UB dressing with Mod A  Skilled Therapeutic Interventions/Progress Updates:      Therapy Documentation Precautions:  Precautions Precautions: Fall Restrictions Weight Bearing Restrictions: No LLE Weight Bearing: Weight bearing as tolerated  Session 1 General: "I am doing okay." Pt supine in bed upon OT arrival, agreeable to OT session.  Pain:  Unrated pain reported in hip, activity, intermittent rest breaks, distractions provided for pain management, pt reports tolerable to proceed. Pt received medication during session.  ADL: Bed mobility: Max A for rolling side to side, increased difficulty rolling to Rt  Grooming: SBA bed level for washing face Oral hygiene: SBA bed level for brushing teeth, able to manipulate containers  UB dressing: Mod A, assistance with pulling down back donning shirt, able to thread arms LB dressing: Max A using reacher bed level, assistance required over LLE, using Lt arm for reacher d/t increased ROM, rolling/slight bridging to don over waist Footwear: Mod A, able to don 1 sock using sock aide, required assistance for Lt foot  Increased time to complete ADL tasks required, occasional VC for task redirection during tasks.  Other Treatments: Pt educated on purpose of reacher and sock aide and how to use correctly.   Pt supine in bed with bed alarm activated, 2 bed rails up, call light within reach and 4Ps assessed.   Session 2 General: "Hi !" Pt seated in W/C upon  OT arrival, agreeable to OT. Pt on IV fluids during session.  Pain:  no pain reported   ADL: Bed mobility: Mod A to manage BLE, mod A to scoot up in bed  Balance: Pt seated EOB CGA for ~2 min  Exercises: Pt completed 3x10 shoulder elevation/depression/retraction/protraction with VC for technique. Pt completed 10 minutes of arm bike, 5 min forward, 5 min backward in order to increase BUE/BLEstrength and endurance in preparation for increased independence in ADLs such as UB dressing. Rest break after 5 min, on moderate resistance.    Other Treatments: Pt completed pendulum exercises standing ~10 min CGA-Min A at sink, rest break at 5 min, forward/backward/circular motions with RUE in order to loosen shoulder muscles in order for increased ROM to complete ADLs such as UB dressing. Pt propelled room>therapy gym with BLE with increased time and Min A.  Pt supine in bed with bed alarm activated, 2 bed rails up, call light within reach and 4Ps assessed.  Therapy/Group: Individual Therapy  Velia Meyer, OTD, OTR/L 06/18/2023, 4:07 PM

## 2023-06-19 ENCOUNTER — Telehealth (HOSPITAL_COMMUNITY): Payer: Self-pay | Admitting: Pharmacy Technician

## 2023-06-19 ENCOUNTER — Other Ambulatory Visit (HOSPITAL_COMMUNITY): Payer: Self-pay

## 2023-06-19 LAB — GLUCOSE, CAPILLARY
Glucose-Capillary: 199 mg/dL — ABNORMAL HIGH (ref 70–99)
Glucose-Capillary: 160 mg/dL — ABNORMAL HIGH (ref 70–99)
Glucose-Capillary: 132 mg/dL — ABNORMAL HIGH (ref 70–99)

## 2023-06-19 MED ORDER — SODIUM CHLORIDE 0.9 % IV SOLN: INTRAVENOUS | Status: AC

## 2023-06-19 NOTE — Plan of Care (Signed)
  Problem: Consults Goal: RH GENERAL PATIENT EDUCATION Description: See Patient Education module for education specifics. Outcome: Progressing Goal: Skin Care Protocol Initiated - if Braden Score 18 or less Description: If consults are not indicated, leave blank or document N/A Outcome: Progressing Goal: Diabetes Guidelines if Diabetic/Glucose > 140 Description: If diabetic or lab glucose is > 140 mg/dl - Initiate Diabetes/Hyperglycemia Guidelines & Document Interventions  Outcome: Progressing   Problem: RH BOWEL ELIMINATION Goal: RH STG MANAGE BOWEL WITH ASSISTANCE Description: STG Manage Bowel with min Assistance. Outcome: Progressing Goal: RH STG MANAGE BOWEL W/MEDICATION W/ASSISTANCE Description: STG Manage Bowel with Medication with min Assistance. Outcome: Progressing   Problem: RH BLADDER ELIMINATION Goal: RH STG MANAGE BLADDER WITH ASSISTANCE Description: STG Manage Bladder With min Assistance Outcome: Progressing   Problem: RH SKIN INTEGRITY Goal: RH STG SKIN FREE OF INFECTION/BREAKDOWN Description: Incision will be free of infection with continued healing with min assist. Wounds will continue to heal and be free of infection and additional breakdown with min assist  Outcome: Progressing Goal: RH STG ABLE TO PERFORM INCISION/WOUND CARE W/ASSISTANCE Description: STG Able To Perform Incision/Wound Care With min Assistance. Outcome: Progressing   Problem: RH SAFETY Goal: RH STG ADHERE TO SAFETY PRECAUTIONS W/ASSISTANCE/DEVICE Description: STG Adhere to Safety Precautions With min Assistance/Device. Outcome: Progressing Goal: RH STG DECREASED RISK OF FALL WITH ASSISTANCE Description: STG Decreased Risk of Fall With min Assistance. Outcome: Progressing   Problem: RH PAIN MANAGEMENT Goal: RH STG PAIN MANAGED AT OR BELOW PT'S PAIN GOAL Description: Pain will be managed 4 out of 10 on pain scale with PRN medications min assist  Outcome: Progressing   Problem: RH  KNOWLEDGE DEFICIT GENERAL Goal: RH STG INCREASE KNOWLEDGE OF SELF CARE AFTER HOSPITALIZATION Description: Patient/caregiver will be able to manage medications, improve A1c, modify diet/lifestyle to improve overall health, and be able to manage wounds independently from nursing education, nursing handouts and other resources Outcome: Progressing

## 2023-06-19 NOTE — IPOC Note (Signed)
Overall Plan of Care [facility]) Patient Details Name: Dylan Bruce MRN: [id number] DOB: 08/03/1946  Admitting Diagnosis: Closed left hip fracture, initial encounter [facility])  Hospital Problems: Principal Problem:   Closed left hip fracture, initial encounter [facility]) Active Problems:   Spina bifida (HCC)   S/p left hip fracture     Functional Problem List: Nursing Bowel, Safety, Sensory, Skin Integrity, Medication Management, Pain  PT Balance, Edema, Endurance, Motor, Pain, Safety, Sensory, Skin Integrity  OT Balance, Endurance, Motor, Pain, Safety  SLP    TR         Basic ADL's: OT Eating, Grooming, Bathing, Dressing, Toileting     Advanced  ADL's: OT       Transfers: PT Bed to Chair, Bed Mobility, Car, Occupational psychologist, Research scientist (life sciences): PT Ambulation, Psychologist, prison and probation services, Stairs     Additional Impairments: OT    SLP        TR      Anticipated Outcomes Item Anticipated Outcome  Self Feeding set up  Swallowing      Basic self-care  supervision overall  Toileting  supervision overall   Bathroom Transfers supervision overall  Bowel/Bladder  continent of B/B  Transfers  supervision basic transfers; min assist car  Locomotion  supervision household gait distances; mod I w/c mobility  Communication     Cognition     Pain  less than 4  Safety/Judgment  no falls   Therapy Plan: PT Intensity: Minimum of 1-2 x/day ,45 to 90 minutes PT Frequency: 5 out of 7 days PT Duration Estimated Length of Stay: 14-21 days OT Intensity: Minimum of 1-2 x/day, 45 to 90 minutes OT Frequency: 5 out of 7 days OT Duration/Estimated Length of Stay: 18-21 days     Team Interventions: Nursing Interventions Patient/Family Education, Pain Management, Medication Management, Discharge Planning, Bowel Management, Skin Care/Wound Management, Psychosocial Support, Disease Management/Prevention  PT interventions Ambulation/gait training, Warden/ranger,  Cognitive remediation/compensation, Community reintegration, Discharge planning, DME/adaptive equipment instruction, Disease management/prevention, Functional mobility training, Neuromuscular re-education, Pain management, Patient/family education, Psychosocial support, Skin care/wound management, Splinting/orthotics, Stair training, Therapeutic Activities, Therapeutic Exercise, UE/LE Strength taining/ROM, UE/LE Coordination activities, Wheelchair propulsion/positioning  OT Interventions Balance/vestibular training, Discharge planning, Pain management, Self Care/advanced ADL retraining, Therapeutic Activities, UE/LE Coordination activities, Functional mobility training, Patient/family education, Therapeutic Exercise, Community reintegration, Fish farm manager, Neuromuscular re-education, UE/LE Strength taining/ROM  SLP Interventions    TR Interventions    SW/CM Interventions Discharge Planning, Psychosocial Support, Patient/Family Education   Barriers to Discharge MD  Medical stability, Home enviroment access/loayout, IV antibiotics, Wound care, Lack of/limited family support, and Weight bearing restrictions  Nursing Decreased caregiver support, Wound Care, Weight bearing restrictions, Medication compliance 1 level with ramp entry  PT Decreased caregiver support wife unable to provide any assitance per report  OT Decreased caregiver support    SLP      SW       Team Discharge Planning: Destination: PT-Home ,OT- Home , SLP-  Projected Follow-up: PT-Home health PT, 24 hour supervision/assistance, OT-  Home health OT, SLP-  Projected Equipment Needs: PT-Rolling walker with 5" wheels, Wheelchair cushion (measurements), Wheelchair (measurements), OT- To be determined, 3 in 1 bedside comode, Tub/shower bench, SLP-  Equipment Details: PT- , OT-  Patient/family involved in discharge planning: PT- Patient,  OT-Patient, SLP-   MD ELOS: 18-21 days Medical Rehab Prognosis:   Good Assessment: The patient has been admitted for CIR therapies with the diagnosis of  L hip fx  s/p IM nail with spina bifida. The team will be addressing functional mobility, strength, stamina, balance, safety, adaptive techniques and equipment, self-care, bowel and bladder mgt, patient and caregiver education, . Goals have been set at supervision. Anticipated discharge destination is home.        See Team Conference Notes for weekly updates to the plan of care

## 2023-06-19 NOTE — Progress Notes (Signed)
Patient ID: Dylan Bruce, male   DOB: 1946/07/31, 77 y.o.   MRN: [id number]  Met with pt and spoke with wife regarding team conference update with goals of supervision-min level and discharge date of 9/6. He felt it is long but have a long way to go from mod/max to supervision level. Wife reports he is weak and needs to work on his endurance and strengthening. Will work on discharge needs.

## 2023-06-19 NOTE — Telephone Encounter (Signed)
Pharmacy Patient Advocate Encounter  Received notification from [employer] that Prior Authorization for Modafinil 200MG  tablets has been APPROVED from 06/19/2023 to 06/18/2024. Ran test claim, Copay is $10.00. This test claim was processed through [facility]- copay amounts may vary at other pharmacies due to pharmacy/plan contracts, or as the patient moves through the different stages of their insurance plan.   PA #/Case ID/Reference #: [id number] Key: [id number]

## 2023-06-19 NOTE — Progress Notes (Signed)
Occupational Therapy Session Note  Patient Details  Name: Dylan Bruce MRN: [id number] Date of Birth: [birth year]  Today's Date: 06/19/2023 OT Individual Time: 0700-0800 OT Individual Time Calculation (min): 60 min    Short Term Goals: Week 1:  OT Short Term Goal 1 (Week 1): Pt will sit EOB while completing ADLs with Max A OT Short Term Goal 2 (Week 1): Pt will complete LB dressing with Max A with AE as necessary OT Short Term Goal 3 (Week 1): Pt will complete UB dressing with Mod A  Skilled Therapeutic Interventions/Progress Updates:    Pt sleeping upon arrival. Min multimodal cues to arouse. Pt required extra time to fully awaken but then eager to start his day. Supine>sit EOB with min A (assist with LLE) and HOB elevated. Pt donned pullover shirt with supervision. Sitting balance with supervision. Pt requested to eat breakfast. MD and nurse in room while pt supine and seated EOB. Pt required more then a reasonable amount of time to complete all tasks. Self feeding with supervision. Pt remained seated EOB eating breakfast. Bed alarm activated. NT notified.  Therapy Documentation Precautions:  Precautions Precautions: Fall Restrictions Weight Bearing Restrictions: No LLE Weight Bearing: Weight bearing as tolerated   Pain: Pt c/o 4/10 Lt hip and back pain; meds requested and admin during session, repositioned  Therapy/Group: Individual Therapy  Rich Brave 06/19/2023, 10:33 AM

## 2023-06-19 NOTE — Patient Care Conference (Signed)
Inpatient RehabilitationTeam Conference and Plan of Care Update Date: 06/19/2023   Time: 11:38 AM    Patient Name: Dylan Bruce      Medical Record Number: [id number]  Date of Birth: 04-29-1946 Sex: Male         Room/Bed: 4M01C/4M01C-01 Payor Info: Payor: Advertising copywriter / Plan: [employer] PPO / Product Type: *No Product type* /    Admit Date/Time:  06/16/2023  5:42 PM  Primary Diagnosis:  Closed left hip fracture, initial encounter [facility])  Hospital Problems: Principal Problem:   Closed left hip fracture, initial encounter [facility]) Active Problems:   Spina bifida [facility])   S/p left hip fracture    Expected Discharge Date: Expected Discharge Date: 07/13/23  Team Members Present: Physician leading conference: Dr. [name] Social Worker Present: Dossie Der, LCSW Nurse Present: Vedia Pereyra, RN PT Present: Truitt Leep, PT OT Present: Velia Meyer, OT SLP Present: Feliberto Gottron, SLP PPS Coordinator present : Fae Pippin, SLP     Current Status/Progress Goal Weekly Team Focus  Bowel/Bladder   Pt is continent/incontinent of b/b LBM: 08/12   become cont. of b/b   timed toileting qshift and PRN    Swallow/Nutrition/ Hydration   BSE Pending           ADL's   Bed mobility: Max A for rolling side to side, increased difficulty rolling to Rt   Grooming: SBA bed level for washing face  Oral hygiene: SBA bed level for brushing teeth, able to manipulate containers   UB dressing: Mod A, assistance with pulling down back donning shirt, able to thread arms  LB dressing: Max A using reacher bed level, assistance required over LLE, using Lt arm for reacher d/t increased ROM, rolling/slight bridging to don over waist  Footwear: Mod A, able to don 1 sock using sock aide, required assistance for Lt foot   supervision overall   standing endurance, shower level bathing, increase LB ADLs    Mobility   mod/max bed mobility, mod A sit to stand, min A stand pivot, mod/max squat pivot  depending on fatigue   (S) ambulatory goals  LE ROM/flexibility deficits    Communication                Safety/Cognition/ Behavioral Observations               Pain   pt states pain in L hip   pt denies pain   pain assessment qshift and PRN    Skin   Pt has stage 2 to coccyx, r tibial vascular ulcer, L elbow abrasion with slough, anal fissure (see LDA)   prevent further skin breakdown and promote healing  skin assessment qshift and prn      Discharge Planning:  HOme with wife who is herself recovering from an wrist fracture, she does work but is currently out of work. Made aware husband will require 24/7 supervision at DC will need to arrange   Team Discussion: Closed left hip fracture. Zosyn for PNA (RLL). U/A with C&S 06/18/23. Elevated Cr. Abnormal EKG with new Aflutter. Pain managed with PRN medications. Refused long acting medications. Wears CPAP at HS. Abrasion to right shin and left arm and left knee. Stage II to sacrum. Incision to left hip with staples, no drainage. Right heel ulcer.  Swallow evaluation pending. Encourage IS use. Daily weight. Tolerating carb-mod diet.  Patient on target to meet rehab goals: yes, progressing towards goals with discharge date of 07/13/23  *See Care Plan and  progress notes for long and short-term goals.   Revisions to Treatment Plan:  IV fluids.  Nabumetone stopped. Cardiology consulted. Monitor labs/VS Teaching Needs: Medications, safety, self care, gait/transfer training, skin/wound care, etc.   Current Barriers to Discharge: Decreased caregiver support and Wound care  Possible Resolutions to Barriers: Family education Independent with wound care Order recommended DME if needed.     Medical Summary Current Status: new Afib;CHF;  psoriatic arthritis- frequent falls- L hip fx; conitnent; c/o Chronic with acute pain; appears confused;  Barriers to Discharge: Cardiac Complications;Uncontrolled Pain;Self-care education;Other  (comments);Medical stability;Complicated Wound;Uncontrolled Diabetes;Inadequate Nutritional Intake;Weight bearing restrictions  Barriers to Discharge Comments: cpap needs to be worn; pneumonia- on insulin- R heel ulcer; R shin; L elbow- refused to add more pain meds; Possible Resolutions to [employer] Focus: walked 35ft x2 min A-  will not increase pain meds- add Long acting due to confusion-  also has nacrolepsy and on Provigil; confused per all team; RLL pneumonia- on Zosyn; supervision- goals- d/c 9/6   Continued Need for Acute Rehabilitation Level of Care: The patient requires daily medical management by a physician with specialized training in physical medicine and rehabilitation for the following reasons: Direction of a multidisciplinary physical rehabilitation program to maximize functional independence : Yes Medical management of patient stability for increased activity during participation in an intensive rehabilitation regime.: Yes Analysis of laboratory values and/or radiology reports with any subsequent need for medication adjustment and/or medical intervention. : Yes   I attest that I was present, lead the team conference, and concur with the assessment and plan of the team.   Jearld Adjutant 06/19/2023, 4:54 PM

## 2023-06-19 NOTE — Progress Notes (Signed)
Occupational Therapy Session Note  Patient Details  Name: Dylan Bruce MRN: [id number] Date of Birth: 1946/09/07  Today's Date: 06/19/2023 OT Individual Time: 1445-1530 OT Individual Time Calculation (min): 45 min    Short Term Goals: Week 1:  OT Short Term Goal 1 (Week 1): Pt will sit EOB while completing ADLs with Max A OT Short Term Goal 2 (Week 1): Pt will complete LB dressing with Max A with AE as necessary OT Short Term Goal 3 (Week 1): Pt will complete UB dressing with Mod A  Skilled Therapeutic Interventions/Progress Updates:      Therapy Documentation Precautions:  Precautions Precautions: Fall Restrictions Weight Bearing Restrictions: No LLE Weight Bearing: Weight bearing as tolerated General: "Nice game!" Pt seated in W/C upon OT arrival, agreeable to OT.  Pain: No pain reported   Other Treatments: Upon arrival, pt dressing on elbow was falling off of Lt elbow skin abrasion that has scab on it. Nursing notified, said OT can remove and place bandaid. OT places bandaid and dated it for todays date. Pt completed standing endurance/activity tolerance activity of standing at table top completing game of checkers. Pt stood for 5:14 requiring reaching out of BOS with BUE to move checker pieces. Pt required seated rest break after standing and requested to complete sitting. Pt still able to reach out of BOS to move checker pieces without difficulty. At end of session, OT positioned BLE in order to prevent edema in Lt leg d/t surgery.   Pt seated in W/C at end of session with W/C alarm donned, call light within reach and 4Ps assessed.   Therapy/Group: Individual Therapy  Velia Meyer, OTD, OTR/L 06/19/2023, 4:13 PM

## 2023-06-19 NOTE — Progress Notes (Signed)
PROGRESS NOTE   Subjective/Complaints:  Pt concerned about Pneumonia dx- however explained that since feeling good, I would focus on using ICS q2 hours while awake. And not refuse meds  LBM yesterday.  Takes pain meds and work for 3 hours, but then "in misery"- explained I will not increase pain meds to q3 hours, but willing to do Long acitng pain meds- after prolonged d/w, pt refused Oxycontin/any long acting pain meds.     ROS:   Pt denies SOB, abd pain, CP, N/V/C/D, and vision changes  Except for HPI  Objective:   DG CHEST PORT 1 VIEW  Result Date: 06/18/2023 CLINICAL DATA:  Leukocytosis EXAM: PORTABLE CHEST 1 VIEW COMPARISON:  X-ray 06/09/2023 FINDINGS: Enlarged cardiopericardial silhouette with tortuous ectatic aorta. Sternal wires. Underinflation with vascular congestion. There is more focal opacity developing in the right lung base. An acute infiltrate is possible. No pneumothorax or effusion. Left shoulder arthroplasty IMPRESSION: Underinflation with enlarged heart and vascular congestion. Developing opacity in the right lung base. Subtle infiltrate is possible. Recommend follow-up Electronically Signed   By: Karen Kays M.D.   On: 06/18/2023 13:17   Recent Labs    06/18/23 0524 06/19/23 0519  WBC 17.5* 10.7*  HGB 8.8* 8.1*  HCT 27.9* 26.3*  PLT 454* 483*   Recent Labs    06/18/23 1333 06/19/23 0519  NA 131* 132*  K 4.6 4.7  CL 99 100  CO2 21* 22  GLUCOSE 270* 239*  BUN 83* 84*  CREATININE 1.95* 1.78*  CALCIUM 8.3* 8.3*    Intake/Output Summary (Last 24 hours) at 06/19/2023 1610 Last data filed at 06/19/2023 0740 Gross per 24 hour  Intake 2310 ml  Output 1025 ml  Net 1285 ml     Pressure Injury 06/16/23 Sacrum Medial Stage 2 -  Partial thickness loss of dermis presenting as a shallow open injury with a red, pink wound bed without slough. small open area on coccyx in middle of gluteal cleft 1x1 cm  (Active)  06/16/23 1848  Location: Sacrum  Location Orientation: Medial  Staging: Stage 2 -  Partial thickness loss of dermis presenting as a shallow open injury with a red, pink wound bed without slough.  Wound Description (Comments): small open area on coccyx in middle of gluteal cleft 1x1 cm  Present on Admission: Yes    Physical Exam: Vital Signs Blood pressure (!) 129/119, pulse 65, temperature 97.9 F (36.6 C), resp. rate 17, height 6\' 2"  (1.88 m), weight 93 kg, SpO2 100%.     General: awake, alert, appropriate, sitting up in bed; OT and nursing in room;  NAD HENT: conjugate gaze; oropharynx moist CV: regular rate- sounds like in rhythm; no JVD Pulmonary: CTA B/L; no W/R/R- decreased at bases B/L GI: soft, NT, ND, (+)BS- normoactive Psychiatric: appropriate- focused on pain meds and pneumonia Neurological: Ox3    Cervical back: Tenderness present.     Comments: Psoriatic arthritis changes esp in hands DIPs are overflexed/contracted R>L and somewhat of MCPs somewhat flexed at rest- has hand contractures   R shoulder has ~ 20-25 degrees of active ROM max RUE- deltoid 2-/5; otherwise 4+/5 in RUE LUE shoulder has >90 degrees  and 4+/5 in LUE RLE- HF proximally 3-/5 and sitally 4/5 LLE- HF 2-/5; KE 2/5; and DF/PF 4/5-left hip pain improving   Skin:    General: Skin is warm and dry.     Comments:  Sacrum with small area of skin loss, mild blanchable erythema surrounding, no warmth, fluctuance or drainage. L elbow-small area of slough on most inferior part of wound, otherwise clean bases with minimal bleeding, mild bruising surrounding, no erythema, warmth, or edema. Moderate bruising at left hip, dressing clean, intact R shin-skin tear with small amount of bleeding, covered with Mepilex Bilateral upper extremity ecchymosis  Neurological:     Comments: Spina bifida changes to LE's/decreased  sensation   Assessment/Plan: 1. Functional deficits which require 3+ hours per  day of interdisciplinary therapy in a comprehensive inpatient rehab setting. Physiatrist is providing close team supervision and 24 hour management of active medical problems listed below. Physiatrist and rehab team continue to assess barriers to discharge/monitor patient progress toward functional and medical goals  Care Tool:  Bathing              Bathing assist Assist Level: Maximal Assistance - Patient 24 - 49%     Upper Body Dressing/Undressing Upper body dressing        Upper body assist Assist Level: Maximal Assistance - Patient 25 - 49%    Lower Body Dressing/Undressing Lower body dressing            Lower body assist Assist for lower body dressing: Total Assistance - Patient < 25%     Toileting Toileting    Toileting assist Assist for toileting: Total Assistance - Patient < 25%     Transfers Chair/bed transfer  Transfers assist     Chair/bed transfer assist level: Minimal Assistance - Patient > 75%     Locomotion Ambulation   Ambulation assist   Ambulation activity did not occur: Safety/medical concerns    Assistive device: Walker-rolling     Walk 10 feet activity   Assist  Walk 10 feet activity did not occur: Safety/medical concerns        Walk 50 feet activity   Assist Walk 50 feet with 2 turns activity did not occur: Safety/medical concerns         Walk 150 feet activity   Assist Walk 150 feet activity did not occur: Safety/medical concerns         Walk 10 feet on uneven surface  activity   Assist Walk 10 feet on uneven surfaces activity did not occur: Safety/medical concerns         Wheelchair     Assist Is the patient using a wheelchair?: Yes Type of Wheelchair: Manual    Wheelchair assist level: Dependent - Patient 0%      Wheelchair 50 feet with 2 turns activity    Assist        Assist Level: Dependent - Patient 0%   Wheelchair 150 feet activity     Assist      Assist  Level: Dependent - Patient 0%   Blood pressure (!) 129/119, pulse 65, temperature 97.9 F (36.6 C), resp. rate 17, height 6\' 2"  (1.88 m), weight 93 kg, SpO2 100%.  Medical Problem List and Plan: 1. Functional deficits secondary to  L intertrochanteric hip fx s/p IM nail WBAT             -patient may  shower             -ELOS/Goals: 10-14 days  supervision             Admit to CIR  Con't CIR PT and OT  Team conference today to determine length of stay 2.  Antithrombotics: -DVT/anticoagulation:  Pharmaceutical: Eliquis             -antiplatelet therapy: Plavix    3. 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   No complaints of pain today, continue current regimen  8/13- said pain not controlled from hour 3-4- but declined Oxycontin for pain-stopped last dose of Nabumetone- since Cr up so much 4. Mood/Behavior/Sleep: LCSW to evaluate and provide emotional support             -antipsychotic agents: Vilazodone 40 mg daily             -continue Remeron 30 mg q HS             -continue Provigil 100 mg daily     5. Neuropsych/cognition: This patient is capable of making decisions on his own behalf.   6. Skin/Wound Care: Routine skin care checks             -sacral pressure injury; continue local care/pressure relief             -monitor surgical incision             L elbow and R lower leg wounds- local wound care  8-12: Dressing removed and wounds observed as above; no obvious source of infection, but if no other findings would be most likely soft  tissue infection from left elbow or hip..  See below.  7. 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  8/13- CBGs running 199 to 265 in last 24 hours- since Semglee reduced. Don't feel comfortable adding PO meds since Cr up to 1.78 today 11: Venous stasis:  RLE pre-tibial ulcer; continue local wound care   12: S/p left IM nail Dr. [name]             -weight bearing at tolerated   13: Sleep apnea: CPAP at 17 nightly   14: Left rib fractures: pain control and pulmonary toilet   15: Paroxysmal atrial fib: on Eliquis, Imdur, Demadex (rate controlled no other meds at home)   8/12: EKG with normal ventricular rate, however in atrial flutter, new diagnosis.  Not on rate controlling medications, cardiology consulted for input.. Getting IVF as below. Patient otherwise stable.   16: BPH: continue Flomax   17:Psoriasis/psoriatic arthritis: his family brought his Desonide 0.05% cream and clobetasol 0.05% cream to apply to hands, elbows, knees daily as needed             -If can get Cosentyx from home, can give to him- 2 weeks late, will make him hurt too much  8/11- educated pt don't have Cosentyx in hospital- will  need ot bring form home 18: Rosacea: his family brought in his Soolantra (ivermectin) 1% cream to apply to face daily as needed   8/11- ordered by pharmacy today 19: s/p CABG 2023   20: Osteoarthritis: S/p left shoulder arthroplasty; needs the right shoulder done as well- has R frozen shoulder as a result.    21. 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    8/11- BUN up to 60- very dry- and Cr up some- will give IVFs 60cc/hour after therapy til tomorrow and recheck labs in AM  8/12: Creatinine still increasing 1.99, along with increased potassium and downtrending sodium; increase fluid rate today to 1 L IV fluid at 75/h, urine studies pending, repeat BMP at 2 PM to ensure no further worsening -if creatinine increasing or K increasing, will consult nephrology at that time.  May need bicarbonate.  - Urinalysis normal, urine sodium and serum osmolality appear prerenal, awaiting urine osmolality; K responsive to IVF, continue for 1L, Cr/BUN stable will repeat in AM  8/13- Wil give another 12 hours of NS IVFs 100cc/hour- Cr down to 1.78 and BUN still 84-  22. - Walked, but has Spina bifida with frequent falls- will d/w therapy.    23. Leukocytosis - worsening  8/11- afebrile- feel good- will recheck in AM- if doesn't improve, will check U/A and Cx and CXR- having nurse check wounds as well- just to make sure-   -8-12: WBCs continue uptrending, 17.5 today, along with thrombocytosis and anemia.  Ordered urinalysis with reflex to culture, chest x-ray, initiate IV fluids as above.   Urinalysis within normal limits   Chest x-ray pending - RLL infiltrate, significant from last CXR, concerning for aspiration pneumonia. Start IV Zosyn 4.75 mg Q8H for 5 days, SLP consult placed for swallow eval. Labs in AM    8/13- WBC down to 10.7 from 17.5- doing much better- con't Zosyn for pneumonia- asked pt to use  ICS q2 hours while awake.    I spent a total of  58  minutes on total care today- >50% coordination of care- due to  On Eliquis for Afib- pending Cards note; IPOC today as well as team conference to determine length of of stay. And complex medical issues with needing IVFs, cards.     LOS: 3 days A FACE TO FACE EVALUATION WAS PERFORMED    06/19/2023, 9:28 AM    Addendum:

## 2023-06-19 NOTE — Evaluation (Signed)
Speech Language Pathology Bedside Swallow Evaluation   Patient Details  Name: Dylan Bruce MRN: [id number] Date of Birth: [birth year]  SLP Diagnosis: N/A Rehab Potential: N/A ELOS: N/A    Today's Date: 06/19/2023 SLP Individual Time: 7829-5621 SLP Individual Time Calculation (min): 30 min   Hospital Problem: Principal Problem:   Closed left hip fracture, initial encounter (HCC) Active Problems:   Spina bifida (HCC)   S/p left hip fracture  Past Medical History:  Past Medical History:  Diagnosis Date   Aortic stenosis    ARTHRITIS    ASTHMA    CAD (coronary artery disease)    CHF (congestive heart failure) (HCC)    DM    GERD    HYPERLIPIDEMIA    Hypertension    PSORIASIS    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 Clifton James and Dr. Delia Chimes   SLEEP APNEA    SPINA BIFIDA    Past Surgical History:  Past Surgical History:  Procedure Laterality Date   BACK SURGERY     CARPAL TUNNEL RELEASE Left 08/21/2019   Procedure: CARPAL TUNNEL RELEASE;  Surgeon: Cindee Salt, MD;  Location: [facility];  Service: Orthopedics;  Laterality: Left;  AXILLARY BLOCK   CORONARY ARTERY BYPASS GRAFT     INTRAMEDULLARY (IM) NAIL INTERTROCHANTERIC Left 06/10/2023   Procedure: INTRAMEDULLARY (IM) NAIL INTERTROCHANTERIC;  Surgeon: Teryl Lucy, MD;  Location: [facility];  Service: Orthopedics;  Laterality: Left;   INTRAOPERATIVE TRANSTHORACIC ECHOCARDIOGRAM N/A 02/21/2023   Procedure: INTRAOPERATIVE TRANSTHORACIC ECHOCARDIOGRAM;  Surgeon: Kathleene Hazel, MD;  Location: [facility];  Service: Open Heart Surgery;  Laterality: N/A;   JOINT REPLACEMENT Left    shoulder   LEFT HEART CATH AND CORS/GRAFTS ANGIOGRAPHY N/A 06/18/2018   Procedure: LEFT HEART CATH AND CORS/GRAFTS ANGIOGRAPHY;  Surgeon: Runell Gess, MD;  Location: [facility];  Service: Cardiovascular;  Laterality: N/A;   RIGHT/LEFT HEART CATH AND  CORONARY/GRAFT ANGIOGRAPHY N/A 01/11/2023   Procedure: RIGHT/LEFT HEART CATH AND CORONARY/GRAFT ANGIOGRAPHY;  Surgeon: Kathleene Hazel, MD;  Location: [facility];  Service: Cardiovascular;  Laterality: N/A;   TRANSCATHETER AORTIC VALVE REPLACEMENT, TRANSFEMORAL Left 02/21/2023   Procedure: Transcatheter Aortic Valve Replacement, Transfemoral;  Surgeon: Kathleene Hazel, MD;  Location: [facility];  Service: Open Heart Surgery;  Laterality: Left;   TRIGGER FINGER RELEASE Left 08/21/2019   Procedure: RELEASE TRIGGER LEFT SMALL FINGER LEFT INDEX;  Surgeon: Cindee Salt, MD;  Location: [facility];  Service: Orthopedics;  Laterality: Left;   ULNAR NERVE TRANSPOSITION Left 08/21/2019   Procedure: DECOMPRESSION WITH ULNAR NERVE LEFT CUBITAL TUNNEL ULNAR;  Surgeon: Cindee Salt, MD;  Location: [facility];  Service: Orthopedics;  Laterality: Left;    Assessment / Plan / Recommendation Clinical Impression The pt is a 77 yo male presenting 8/3 from [facility] for surgical fixation of L hip fx sustained in a fall. Pt also with L rib fx on imaging. Pt s/p IM nail of L femur 8/4. PMH includes: aortic stenosis, asthma, CAD, CHF, DM II, HLD, HTN, TAVR in 2024, sleep apnea, and spina bifida. Admitted to CIR on 8/10. SLP consulted due to new RLL PNA.  Patient's overall swallowing function appeared [facility] with snack of thin liquids and regular textures (goldfish). Patient demonstrated a swift swallow response without overt s/s of aspiration with thin liquids via cup or straw. Efficient mastication and complete oral clearance with  regular textures was also observed with goldfish and his pot roast that he received on his lunch tray. Both the patient and his nurse report that he has been consuming his current diet as well as medications whole with thin liquids without difficulty. Patient did report a history of GERD with SLP providing education regarding reflux  precautions. Recommend patient continue current diet with SLP intervention not warranted at this time.  Patient verbalized understanding and agreement.    Skilled Therapeutic Interventions          Administered a BSE, please see above for details   SLP Assessment  Patient does not need any further Speech Lanaguage Pathology Services    Recommendations  Patient destination: Home Follow up Recommendations: None Equipment Recommended: None recommended by SLP    SLP Frequency N/A  SLP Duration  SLP Intensity  SLP Treatment/Interventions N/A  N/A  N/A   Pain No reports of pain   SLP Evaluation  Oral Motor Oral Motor/Sensory Function Overall Oral Motor/Sensory Function: Within functional limits  Care Tool Care Tool Cognition Ability to hear (with hearing aid or hearing appliances if normally used Ability to hear (with hearing aid or hearing appliances if normally used): 0. Adequate - no difficulty in normal conservation, social interaction, listening to TV   Expression of Ideas and Wants Expression of Ideas and Wants: 4. Without difficulty (complex and basic) - expresses complex messages without difficulty and with speech that is clear and easy to understand   Understanding Verbal and Non-Verbal Content Understanding Verbal and Non-Verbal Content: 3. Usually understands - understands most conversations, but misses some part/intent of message. Requires cues at times to understand  Memory/Recall Ability Memory/Recall Ability : That he or she is in a hospital/hospital unit;Staff names and faces   Bedside Swallowing Assessment General Date of Onset: 06/18/23 Previous Swallow Assessment: N/A Diet Prior to this Study: Regular;Thin liquids (Level 0) Temperature Spikes Noted: No Respiratory Status: Room air History of Recent Intubation: No Behavior/Cognition: Alert;Cooperative;Pleasant mood Oral Cavity - Dentition: Adequate natural dentition Self-Feeding Abilities: Able to feed  self;Needs assist Patient Positioning: Upright in chair/Tumbleform Baseline Vocal Quality: Normal Volitional Cough: Strong Volitional Swallow: Able to elicit  Ice Chips Ice chips: Not tested Thin Liquid Thin Liquid: Within functional limits Presentation: Cup;Self Fed;Straw Nectar Thick Nectar Thick Liquid: Not tested Honey Thick Honey Thick Liquid: Not tested Puree Puree: Within functional limits Presentation: Self Fed;Spoon Solid Solid: Within functional limits Presentation: Self Fed BSE Assessment Risk for Aspiration Impact on safety and function: Mild aspiration risk Other Related Risk Factors: History of GERD;Deconditioning  Short Term Goals: N/A   Refer to Care Plan for Long Term Goals  Recommendations for other services: None   Discharge Criteria: Patient will be discharged from SLP if patient refuses treatment 3 consecutive times without medical reason, if treatment goals not met, if there is a change in medical status, if patient makes no progress towards goals or if patient is discharged from hospital.  The above assessment, treatment plan, treatment alternatives and goals were discussed and mutually agreed upon: by patient  ,  06/19/2023, 2:31 PM

## 2023-06-19 NOTE — Progress Notes (Signed)
Physical Therapy Session Note  Patient Details  Name: Dylan Bruce MRN: [id number] Date of Birth: 1946-03-01  Today's Date: 06/19/2023 PT Individual Time: 4098-1191, 1005-1110, 4782-9562 PT Individual Time Calculation (min): 21 min , 65 min, 48 min   Short Term Goals: Week 1:  PT Short Term Goal 1 (Week 1): Pt will be able to perform bed mobility with mod assist PT Short Term Goal 2 (Week 1): Pt will be able to perform sit <> stands with mod assist PT Short Term Goal 3 (Week 1): Pt will be able to perform bed <> w/c transfers with mod assist PT Short Term Goal 4 (Week 1): Pt will be able to gait x 25' with min assist  Skilled Therapeutic Interventions/Progress Updates:      Therapy Documentation Precautions:  Precautions Precautions: Fall Restrictions Weight Bearing Restrictions: No LLE Weight Bearing: Weight bearing as tolerated  Treatment Session 1:   Pt received seated edge of bed and requesting assistance to transfer to chair. Pt requires mod A with sit to stand and min A with stand pivot to TIS w/c with increased time due to pain and fatigue. PT utilized therapeutic use of self to promote efficiency. Pt left seated in TIS at bedside with all needs in reach and alarm on with feet positioned on step stool to increase comfort.   Treatment Session 2:   Pt received seated in TIS and reporting discomfort to buttocks. PT replaced standard foam cushion with ROHO and pt reported relief.   Pt requested to remove hospital disposable briefs and put on underwear. Pt requires total A for lower body dressing for time management and mod A with sit to stand. Pt able to tolerate standing ~5 minutes as PT provided posterior per-care and pt set-up for anterior hygiene. Of note PT observed red area and applied barrier cream.   Pt performed self-care task at sink and required set up for oral and hand hygiene. Pt dependently transported for time to main gym and participated in gait training 2 x 5  ft with RW. Pt requires mod A to achieve standing position and min A for ambulation with cues for upright posture, increased posterior chain activation, increased step length and upward gaze.    Pt returned to room and left seated in w/c at bedside with all needs in reach and alarm on.   Treatment Session 3:   Pt received seated in w/c at bedside requesting assisting for dressing. Pt's son present at start of session and PT educated and recommended pt to discharge with 24/7 assist and son reports that he is looking into private pay assistance. Pt with unrated R shoulder pain, provided rest and repositioning with mobility.   Pt min A with sit to stand with RW and total A for removal of shorts. Pt set-up for anterior peri-care. Pt utilized reacher with mod A for lower body dressing and requires min A with sit to stand. Pt removed both hands from walker to pull up pants and requires min A for balance. Pt ambulated 10 ft room level min A to sink and performed oral hygiene with set-up assist.   Pt left seated in TIS at bedside with all needs in reach and alarm on.   Therapy/Group: Individual Therapy  Truitt Leep Truitt Leep PT, DPT  06/19/2023, 7:51 AM

## 2023-06-20 ENCOUNTER — Inpatient Hospital Stay (HOSPITAL_COMMUNITY): Payer: Commercial Managed Care - PPO

## 2023-06-20 LAB — BRAIN NATRIURETIC PEPTIDE: B Natriuretic Peptide: 306 pg/mL — ABNORMAL HIGH (ref 0.0–100.0)

## 2023-06-20 LAB — GLUCOSE, CAPILLARY
Glucose-Capillary: 148 mg/dL — ABNORMAL HIGH (ref 70–99)
Glucose-Capillary: 167 mg/dL — ABNORMAL HIGH (ref 70–99)
Glucose-Capillary: 125 mg/dL — ABNORMAL HIGH (ref 70–99)
Glucose-Capillary: 238 mg/dL — ABNORMAL HIGH (ref 70–99)

## 2023-06-20 MED ORDER — LEVALBUTEROL HCL 0.63 MG/3ML IN NEBU
0.6300 mg | INHALATION_SOLUTION | Freq: Three times a day (TID) | RESPIRATORY_TRACT | Status: DC
  Filled 2023-06-20 (×4): qty 3

## 2023-06-20 MED ORDER — GLUCERNA SHAKE PO LIQD
237.0000 mL | Freq: Three times a day (TID) | ORAL | Status: DC
  Administered 2023-06-20 – 2023-07-10 (×55): 237 mL via ORAL

## 2023-06-20 MED ORDER — SODIUM CHLORIDE 0.9 % IV SOLN: INTRAVENOUS | Status: AC

## 2023-06-20 MED ORDER — IPRATROPIUM-ALBUTEROL 0.5-2.5 (3) MG/3ML IN SOLN: 3.0000 mL | Freq: Four times a day (QID) | RESPIRATORY_TRACT | Status: DC

## 2023-06-20 NOTE — Progress Notes (Signed)
Occupational Therapy Session Note  Patient Details  Name: Dylan Bruce MRN: [id number] Date of Birth: Dec 22, 1945  {CHL IP REHAB OT TIME CALCULATIONS:[id number]}   Short Term Goals: {OT NFA:[id number]}  Skilled Therapeutic Interventions/Progress Updates:      Therapy Documentation Precautions:  Precautions Precautions: Fall Restrictions Weight Bearing Restrictions: No LLE Weight Bearing: Weight bearing as tolerated Session 1 General: "subjective***" Pt supine in bed upon OT arrival, agreeable to OT session. "subjective***" Pt seated in W/C upon OT arrival, agreeable to OT.  Vital Signs:  Pain:  ADL:  Balance  Exercises:  Other Treatments:    ***Pt seated in W/C at end of session with W/C alarm donned, call light within reach and 4Ps assessed.  ***Pt supine in bed with bed alarm activated, 2 bed rails up, call light within reach and 4Ps assessed.  Session 2 General: "subjective***" Pt supine in bed upon OT arrival, agreeable to OT session. "subjective***" Pt seated in W/C upon OT arrival, agreeable to OT.  Vital Signs:  Pain:  ADL:  Balance  Exercises:  Other Treatments:    ***Pt seated in W/C at end of session with W/C alarm donned, call light within reach and 4Ps assessed.  ***Pt supine in bed with bed alarm activated, 2 bed rails up, call light within reach and 4Ps assessed.     Therapy/Group: Individual Therapy  Velia Meyer, OTD, OTR/L 06/20/2023, 9:22 PM

## 2023-06-20 NOTE — Progress Notes (Signed)
Physical Therapy Session Note  Patient Details  Name: Dylan Bruce MRN: [id number] Date of Birth: 1946/08/18  Today's Date: 06/20/2023 PT Individual Time: 1355-1500 PT Individual Time Calculation (min): 65 min   Short Term Goals: Week 1:  PT Short Term Goal 1 (Week 1): Pt will be able to perform bed mobility with mod assist PT Short Term Goal 2 (Week 1): Pt will be able to perform sit <> stands with mod assist PT Short Term Goal 3 (Week 1): Pt will be able to perform bed <> w/c transfers with mod assist PT Short Term Goal 4 (Week 1): Pt will be able to gait x 25' with min assist  Skilled Therapeutic Interventions/Progress Updates: Patient supine in bed with family present on entrance to room. Patient alert and agreeable to PT session.   Patient reported feeling better from this morning when he was in pain, but willing to participate in therapy to get out of bed.   Therapeutic Activity: Bed Mobility: Pt performed supine<>sit on EOB with maxA. VC required for use of UE's to assist in truncal elevation (max/totalA to advance/elevate B LE's onto/off bed - pt cued to use L UE to brace self when transitioning from sitting to supine). Transfers: Pt performed sit<>stand transfer from EOB to standing scale per PA order to get standing weight. Pt with mod/maxA + 2 (RN present) for safety. Pt cued to control descent to EOB from standing on scale at that time but ended up sitting down with little control (pt with increased pain at that time unrated, but it went away after short rest). Pt with minA for stand pivot transfers with RW and required increased time to pivot via small steps (almost sliding). Pt cued throughout rest of session to control descent, and eventually got to supervision to control with mod cues to reach back to surfaces to control. Pt mod/heavy modA to transfer from sitting to standing in RW throughout (supervision for anterior scoot to edge, and mod cues for UE/LE placement).    Therapeutic Exercise: Pt performed the following exercises to increase WB tolerance and endurance on B LE's. - NuStep with level fluctuating between 1 and 3 to increase WB on L LE, but decreased back to 1 due to reports of increased pain. Pt on Nustep for 10 minutes with a slow pace and requested to stop to ambulate several feet before wanting to end the session due to pain on L hip. Pt required maxA to elevate L LE onto step and off step.  - Pt ambulated roughly 8' in ortho gym with min/light modA due to reports of increased pain (daughter with TIS follow). Pt with excessively decreased step clearance/length, and decreased cadence with forward flexed posture (VC for pt to stand upright in RW - mod cues).   Patient supine in bed at end of session with brakes locked, family present, bed alarm set, and all needs within reach.      Therapy Documentation Precautions:  Precautions Precautions: Fall Restrictions Weight Bearing Restrictions: No LLE Weight Bearing: Weight bearing as tolerated   Therapy/Group: Individual Therapy    PTA 06/20/2023, 3:59 PM

## 2023-06-20 NOTE — Progress Notes (Signed)
PROGRESS NOTE   Subjective/Complaints:   Discussed with pt about pain meds- he was asking again for increasing opiates to q3 hours or increase dose- I explained we could add Long acting, but don't feel comfortable doing q3 hours.   Doing about the same  LBM yesterday  Said he didn't know had Afib- however is a chronic dx based on chart.      ROS:  Pt denies SOB, abd pain, CP, N/V/C/D, and vision changes  Pain is biggest issue- per staff, is confused   Except for HPI  Objective:   DG CHEST PORT 1 VIEW  Result Date: 06/18/2023 CLINICAL DATA:  Leukocytosis EXAM: PORTABLE CHEST 1 VIEW COMPARISON:  X-ray 06/09/2023 FINDINGS: Enlarged cardiopericardial silhouette with tortuous ectatic aorta. Sternal wires. Underinflation with vascular congestion. There is more focal opacity developing in the right lung base. An acute infiltrate is possible. No pneumothorax or effusion. Left shoulder arthroplasty IMPRESSION: Underinflation with enlarged heart and vascular congestion. Developing opacity in the right lung base. Subtle infiltrate is possible. Recommend follow-up Electronically Signed   By: Karen Kays M.D.   On: 06/18/2023 13:17   Recent Labs    06/19/23 0519 06/20/23 0548  WBC 10.7* 9.0  HGB 8.1* 8.2*  HCT 26.3* 26.8*  PLT 483* 551*   Recent Labs    06/19/23 0519 06/20/23 0548  NA 132* 138  K 4.7 5.2*  CL 100 107  CO2 22 24  GLUCOSE 239* 172*  BUN 84* 67*  CREATININE 1.78* 1.45*  CALCIUM 8.3* 8.7*    Intake/Output Summary (Last 24 hours) at 06/20/2023 0906 Last data filed at 06/20/2023 0700 Gross per 24 hour  Intake 1645.36 ml  Output 2800 ml  Net -1154.64 ml     Pressure Injury 06/16/23 Sacrum Medial Stage 2 -  Partial thickness loss of dermis presenting as a shallow open injury with a red, pink wound bed without slough. small open area on coccyx in middle of gluteal cleft 1x1 cm (Active)  06/16/23 1848   Location: Sacrum  Location Orientation: Medial  Staging: Stage 2 -  Partial thickness loss of dermis presenting as a shallow open injury with a red, pink wound bed without slough.  Wound Description (Comments): small open area on coccyx in middle of gluteal cleft 1x1 cm  Present on Admission: Yes    Physical Exam: Vital Signs Blood pressure (!) 150/72, pulse 66, temperature 98.9 F (37.2 C), resp. rate 17, height 6\' 2"  (1.88 m), weight 93 kg, SpO2 98%.      General: awake, alert, appropriate, sitting up in bed; NAD HENT: conjugate gaze; oropharynx moist CV: regular rate- but in Afib- is chronic for him; no JVD Pulmonary: CTA B/L; no W/R/R- decreased at bases GI: soft, NT, ND, (+)BS- normoactive Psychiatric: appropriate- but repeating self Neurological: poor STM- alert- repeated self a few times    Cervical back: Tenderness present.     Comments: Psoriatic arthritis changes esp in hands DIPs are overflexed/contracted R>L and somewhat of MCPs somewhat flexed at rest- has hand contractures   R shoulder has ~ 20-25 degrees of active ROM max RUE- deltoid 2-/5; otherwise 4+/5 in RUE LUE shoulder has >90 degrees  and 4+/5 in LUE RLE- HF proximally 3-/5 and sitally 4/5 LLE- HF 2-/5; KE 2/5; and DF/PF 4/5-left hip pain improving   Skin:    General: Skin is warm and dry.     Comments:  Sacrum with small area of skin loss, mild blanchable erythema surrounding, no warmth, fluctuance or drainage. L elbow-small area of slough on most inferior part of wound, otherwise clean bases with minimal bleeding, mild bruising surrounding, no erythema, warmth, or edema. Moderate bruising at left hip, dressing clean, intact R shin-skin tear with small amount of bleeding, covered with Mepilex Bilateral upper extremity ecchymosis  Neurological:     Comments: Spina bifida changes to LE's/decreased  sensation   Assessment/Plan: 1. Functional deficits which require 3+ hours per day of  interdisciplinary therapy in a comprehensive inpatient rehab setting. Physiatrist is providing close team supervision and 24 hour management of active medical problems listed below. Physiatrist and rehab team continue to assess barriers to discharge/monitor patient progress toward functional and medical goals  Care Tool:  Bathing              Bathing assist Assist Level: Maximal Assistance - Patient 24 - 49%     Upper Body Dressing/Undressing Upper body dressing        Upper body assist Assist Level: Maximal Assistance - Patient 25 - 49%    Lower Body Dressing/Undressing Lower body dressing            Lower body assist Assist for lower body dressing: Total Assistance - Patient < 25%     Toileting Toileting    Toileting assist Assist for toileting: Total Assistance - Patient < 25%     Transfers Chair/bed transfer  Transfers assist     Chair/bed transfer assist level: Minimal Assistance - Patient > 75%     Locomotion Ambulation   Ambulation assist   Ambulation activity did not occur: Safety/medical concerns    Assistive device: Walker-rolling     Walk 10 feet activity   Assist  Walk 10 feet activity did not occur: Safety/medical concerns        Walk 50 feet activity   Assist Walk 50 feet with 2 turns activity did not occur: Safety/medical concerns         Walk 150 feet activity   Assist Walk 150 feet activity did not occur: Safety/medical concerns         Walk 10 feet on uneven surface  activity   Assist Walk 10 feet on uneven surfaces activity did not occur: Safety/medical concerns         Wheelchair     Assist Is the patient using a wheelchair?: Yes Type of Wheelchair: Manual    Wheelchair assist level: Dependent - Patient 0%      Wheelchair 50 feet with 2 turns activity    Assist        Assist Level: Dependent - Patient 0%   Wheelchair 150 feet activity     Assist      Assist Level:  Dependent - Patient 0%   Blood pressure (!) 150/72, pulse 66, temperature 98.9 F (37.2 C), resp. rate 17, height 6\' 2"  (1.88 m), weight 93 kg, SpO2 98%.  Medical Problem List and Plan: 1. Functional deficits secondary to  L intertrochanteric hip fx s/p IM nail WBAT             -patient may  shower             -ELOS/Goals: 10-14 days  supervision             D/c 9/6  Con't CIR PT, OT- and SLP? 2.  Antithrombotics: -DVT/anticoagulation:  Pharmaceutical: Eliquis             -antiplatelet therapy: Plavix    3. 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   No complaints of pain today, continue current regimen  8/13- said pain not controlled from hour 3-4- but declined Oxycontin for pain-stopped last dose of Nabumetone- since Cr up so much  8/14- won't change pain meds since more confused per staff- pneumonia vs chronic? Pt asking again for q3 hours prn meds- explained will not do this.  4. Mood/Behavior/Sleep: LCSW to evaluate and provide emotional support             -antipsychotic agents: Vilazodone 40 mg daily             -continue Remeron 30 mg q HS             -continue Provigil 100 mg daily     5. Neuropsych/cognition: This patient is capable of making decisions on his own behalf.   6. Skin/Wound Care: Routine skin care checks             -sacral pressure injury; continue local care/pressure relief             -monitor surgical incision             L elbow and R lower leg wounds- local wound care  8-12: Dressing removed and wounds observed as  above; no obvious source of infection, but if no other findings would be most likely soft tissue infection from left elbow or hip..  See below.  7. 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  8/13- CBGs  running 199 to 265 in last 24 hours- since Semglee reduced. Don't feel comfortable adding PO meds since Cr up to 1.78 today  8/14-CBGs better- 132-199- con't regimen 11: Venous stasis: RLE pre-tibial ulcer; continue local wound care   12: S/p left IM nail Dr. [name]             -weight bearing at tolerated   13: Sleep apnea: CPAP at 17 nightly   14: Left rib fractures: pain control and pulmonary toilet   15: Paroxysmal atrial fib: on Eliquis, Imdur, Demadex (rate controlled no other meds at home)   8/12: EKG with normal ventricular rate, however in atrial flutter, new diagnosis.  Not on rate controlling medications, cardiology consulted for input.. Getting IVF as below. Patient otherwise stable.   8/14- not new per chart- had pAfib- although pt said not aware (poor STM)- con't since rate appears controlled OFF meds- actually running 50s to 90s 16: BPH: continue  Flomax   17:Psoriasis/psoriatic arthritis: his family brought his Desonide 0.05% cream and clobetasol 0.05% cream to apply to hands, elbows, knees daily as needed             -If can get Cosentyx from home, can give to him- 2 weeks late, will make him hurt too much  8/11- educated pt don't have Cosentyx in hospital- will need ot bring form home 18: Rosacea: his family brought in his Soolantra (ivermectin) 1% cream to apply to face daily as needed   8/11- ordered by pharmacy today 19: s/p CABG 2023   20: Osteoarthritis: S/p left shoulder arthroplasty; needs the right shoulder done as well- has R frozen shoulder as a result.    21. 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    8/11- BUN up to 60- very dry- and Cr up some- will give IVFs 60cc/hour after therapy til tomorrow and recheck labs in AM  8/12: Creatinine still increasing 1.99, along with increased potassium and downtrending sodium; increase fluid rate today to 1 L IV fluid at 75/h, urine studies pending, repeat BMP at 2 PM to ensure no further worsening -if creatinine increasing or K increasing, will consult nephrology at that time.  May need bicarbonate.  - Urinalysis normal, urine sodium and serum osmolality appear prerenal, awaiting urine osmolality; K responsive to IVF, continue for 1L, Cr/BUN stable will repeat in AM  8/13- Wil give another 12 hours of NS IVFs 100cc/hour- Cr down to 1.78 and BUN still 84-   8/14- Cr 1.45 down from 1.78 and BUN 67 down from 84- will give 1 more 12 hours of IVFs and recheck in AM 22. - Walked, but has Spina bifida with frequent falls- will d/w therapy.    23. Leukocytosis - worsening  8/11- afebrile- feel good- will recheck in AM- if doesn't improve, will check U/A and Cx and CXR- having nurse check wounds as well- just to make sure-   -8-12: WBCs continue uptrending, 17.5 today, along with  thrombocytosis and anemia.  Ordered urinalysis with reflex to culture, chest x-ray, initiate IV fluids as above.   Urinalysis within normal limits   Chest x-ray pending - RLL infiltrate, significant from last CXR, concerning for aspiration pneumonia. Start IV Zosyn 4.75 mg Q8H for 5 days, SLP consult placed for swallow eval. Labs in AM    8/13- WBC down to 10.7 from 17.5- doing much better- con't Zosyn for pneumonia- asked pt  to use ICS q2 hours while awake.   8/14- recheck in AM    I spent a total of 36   minutes on total care today- >50% coordination of care- due to  D/w pt about Afib- daily weights- and d/w nursing about weights, pain and Afib- and complex medical issues as detailed above.   LOS: 4 days A FACE TO FACE EVALUATION WAS PERFORMED    06/20/2023, 9:06 AM    Addendum:

## 2023-06-20 NOTE — Progress Notes (Signed)
Physical Therapy Session Note  Patient Details  Name: Dylan Bruce MRN: [id number] Date of Birth: 06-26-1946  Today's Date: 06/20/2023 PT Individual Time: 1005-1050 PT Individual Time Calculation (min): 45 min   Short Term Goals: Week 1:  PT Short Term Goal 1 (Week 1): Pt will be able to perform bed mobility with mod assist PT Short Term Goal 2 (Week 1): Pt will be able to perform sit <> stands with mod assist PT Short Term Goal 3 (Week 1): Pt will be able to perform bed <> w/c transfers with mod assist PT Short Term Goal 4 (Week 1): Pt will be able to gait x 25' with min assist  Skilled Therapeutic Interventions/Progress Updates:      Therapy Documentation Precautions:  Precautions Precautions: Fall Restrictions Weight Bearing Restrictions: No LLE Weight Bearing: Weight bearing as tolerated  Pt received seated in TIS at bedside with unrated L LE pain and requesting assistance for repositioning. Pt with elevating leg rests and PT attempted increase length due to pt's leg length. PT unsuccessful and notified PT supervisor who plans to assess later in day. Pt with limited left hip and knee mobility and reports discomfort from L LE in dependent position. PT positioned pt LE's on standard chair with pillows and pt reported increased comfort. Pt left seated in TIS at bedside with all needs in reach and alarm on.     Therapy/Group: Individual Therapy  Truitt Leep Truitt Leep PT, DPT  06/20/2023, 7:56 AM

## 2023-06-20 NOTE — Progress Notes (Addendum)
CXR with improvement in aeration. Patient, daughter and wife updated. They are concerned that the MASD/breakdown on sacrum that has been ongoing for months and would prefer to go back to home regimen of triamcinolone and nystatin cream. They feel that buttock pain has been a limiting factor. Air mattress ordered. They will bring his rosacea cream from home for use. Current pain management and Dr. Dahlia Client recs discussed with patient and family-->he would like to continue current regimen. Pulmonary hygiene encouraged with use of flutter valve.

## 2023-06-20 NOTE — Progress Notes (Signed)
Occupational Therapy Session Note  Patient Details  Name: Dylan Bruce MRN: [id number] Date of Birth: 08-06-1946  Today's Date: 06/20/2023 OT Individual Time: 7829-5621 OT Individual Time Calculation (min): 75 min    Short Term Goals: Week 1:  OT Short Term Goal 1 (Week 1): Pt will sit EOB while completing ADLs with Max A OT Short Term Goal 2 (Week 1): Pt will complete LB dressing with Max A with AE as necessary OT Short Term Goal 3 (Week 1): Pt will complete UB dressing with Mod A  Skilled Therapeutic Interventions/Progress Updates:      Therapy Documentation Precautions:  Precautions Precautions: Fall Restrictions Weight Bearing Restrictions: No LLE Weight Bearing: Weight bearing as tolerated General: "I'm just in a lot of pain today." Pt supine in bed upon OT arrival, agreeable to OT session. Nurse gave pt medication at beginning of session  Pain:  8/10 pain reported in Lt hip, activity, intermittent rest breaks, distractions provided for pain management, pt reports tolerable to proceed. OT provided pt with ice pack for pain management  ADL: Bed mobility: Mod A assistance with trunk and occasional LLE off the bed Eating: set-up  bed level Grooming: SBA seated in W/C at sink for brushing hair  Oral hygiene:SBA seated in W/C at sink for brushing teeth, able to manipulate containers and dentures Toileting: bed level with urinal, set-up, 420 mL UB dressing: Min A seated in W/C for donning/doffing overhead shirt assistance pulling down back LB dressing: Max A, assistance to thread over feet and thoroughness for over hips Footwear: total A  socks donning/doffing Bathing: sink level, Mod A, assist required for LE, pt able to wash peri area in standing and complete UB Transfers: Mod A stand pivot transfer with RW d/t pain  Pt requires increased time to complete tasks d/t pain and redirection. Pt required Mod VC for redirection to tasks when completing ADLs.   Other Treatments:  OT applied bandaid to previous abrasion on LUE elbow in order to prevent further injury.  Pt seated in W/C at end of session with W/C alarm donned, call light within reach and 4Ps assessed. OT positioned pt LE in order for pain management.   Therapy/Group: Individual Therapy  Velia Meyer, OTD, OTR/L 06/20/2023, 4:21 PM

## 2023-06-21 LAB — CBC WITH DIFFERENTIAL/PLATELET
Abs Immature Granulocytes: 0.08 10*3/uL — ABNORMAL HIGH (ref 0.00–0.07)
Basophils Absolute: 0.1 10*3/uL (ref 0.0–0.1)
Basophils Relative: 1 %
Eosinophils Absolute: 0.8 10*3/uL — ABNORMAL HIGH (ref 0.0–0.5)
Eosinophils Relative: 7 %
HCT: 28.5 % — ABNORMAL LOW (ref 39.0–52.0)
Hemoglobin: 8.8 g/dL — ABNORMAL LOW (ref 13.0–17.0)
Immature Granulocytes: 1 %
Lymphocytes Relative: 8 %
Lymphs Abs: 0.9 10*3/uL (ref 0.7–4.0)
MCH: 24.9 pg — ABNORMAL LOW (ref 26.0–34.0)
MCHC: 30.9 g/dL (ref 30.0–36.0)
MCV: 80.5 fL (ref 80.0–100.0)
Monocytes Absolute: 0.8 10*3/uL (ref 0.1–1.0)
Monocytes Relative: 8 %
Neutro Abs: 8.1 10*3/uL — ABNORMAL HIGH (ref 1.7–7.7)
Neutrophils Relative %: 75 %
Platelets: 640 10*3/uL — ABNORMAL HIGH (ref 150–400)
RBC: 3.54 MIL/uL — ABNORMAL LOW (ref 4.22–5.81)
RDW: 18.6 % — ABNORMAL HIGH (ref 11.5–15.5)
WBC: 10.8 10*3/uL — ABNORMAL HIGH (ref 4.0–10.5)
nRBC: 0 % (ref 0.0–0.2)

## 2023-06-21 LAB — GLUCOSE, CAPILLARY
Glucose-Capillary: 249 mg/dL — ABNORMAL HIGH (ref 70–99)
Glucose-Capillary: 171 mg/dL — ABNORMAL HIGH (ref 70–99)
Glucose-Capillary: 105 mg/dL — ABNORMAL HIGH (ref 70–99)
Glucose-Capillary: 134 mg/dL — ABNORMAL HIGH (ref 70–99)

## 2023-06-21 LAB — BASIC METABOLIC PANEL
Anion gap: 6 (ref 5–15)
BUN: 48 mg/dL — ABNORMAL HIGH (ref 8–23)
CO2: 24 mmol/L (ref 22–32)
Calcium: 8.9 mg/dL (ref 8.9–10.3)
Chloride: 108 mmol/L (ref 98–111)
Creatinine, Ser: 1.35 mg/dL — ABNORMAL HIGH (ref 0.61–1.24)
GFR, Estimated: 55 mL/min — ABNORMAL LOW (ref >60)
Glucose, Bld: 288 mg/dL — ABNORMAL HIGH (ref 70–99)
Potassium: 5 mmol/L (ref 3.5–5.1)
Sodium: 138 mmol/L (ref 135–145)

## 2023-06-21 MED ORDER — LEVALBUTEROL HCL 0.63 MG/3ML IN NEBU
0.6300 mg | INHALATION_SOLUTION | Freq: Two times a day (BID) | RESPIRATORY_TRACT | Status: DC
  Administered 2023-06-21 – 2023-07-06 (×27): 0.63 mg via RESPIRATORY_TRACT
  Filled 2023-06-21 (×34): qty 3

## 2023-06-21 MED ORDER — SORBITOL 70 % SOLN: 30.0000 mL | Freq: Once | Status: DC

## 2023-06-21 MED ORDER — DESONIDE 0.05 % EX CREA
1.0000 | TOPICAL_CREAM | Freq: Two times a day (BID) | CUTANEOUS | Status: DC | PRN
  Filled 2023-06-21: qty 15

## 2023-06-21 MED ORDER — LEVALBUTEROL HCL 0.63 MG/3ML IN NEBU
0.6300 mg | INHALATION_SOLUTION | Freq: Three times a day (TID) | RESPIRATORY_TRACT | Status: DC
  Administered 2023-06-21: 0.63 mg via RESPIRATORY_TRACT
  Filled 2023-06-21 (×2): qty 3

## 2023-06-21 NOTE — Progress Notes (Signed)
Physical Therapy Session Note  Patient Details  Name: Dylan Bruce MRN: [id number] Date of Birth: 08/14/1946  Today's Date: 06/21/2023 PT Individual Time: 1315-1416 PT Individual Time Calculation (min): 61 min   Short Term Goals: Week 1:  PT Short Term Goal 1 (Week 1): Pt will be able to perform bed mobility with mod assist PT Short Term Goal 2 (Week 1): Pt will be able to perform sit <> stands with mod assist PT Short Term Goal 3 (Week 1): Pt will be able to perform bed <> w/c transfers with mod assist PT Short Term Goal 4 (Week 1): Pt will be able to gait x 25' with min assist  Skilled Therapeutic Interventions/Progress Updates: Pt presented in bed with family present agreeable to therapy. Pt states pain 6/10 initially increased to 8/10 with mobility and towards end of session. RN notified and pain meds provided at end of session as well as rest and repositioning provided during session. Pt completed supine to sit with use of bed features, increased time and minA. Pt required assistance for both LLE management as well as truncal support. Pt requesting to change into boxer shorts vs brief with shorts. PTA threaded boxers total A and pt was able to complete Sit to stand with minA from elevated bed. PTA provided total A for clothing management. Pt then ambulated with modA ~38ft. Provided cues for increased BUE support when advancing RLE to offload weight on LLE. Pt noted to ambulate with B hip ER, decreased foot clearance, and step to pattern. PTA then worked on positioning of LLE in TIS as pt felt that leg rest was too short. Discussed options of elevating leg, using pillow for support, and reclining for increased comfort. Pt then transported to day room and participated in x 3 bouts of Cybex kinetron 90cm/sec of 5 cycles each. Pt initially required minA to increase end range tolerance but was able to improve range on second and most of third bout. Pt able to complete all bouts but required increased  time between bouts for recovery.  Discussed extensively importance of increasing weight bearing tolerance in LLE for healing. Pt transported back to room and pt agreeable to remain in TIS for a while. Pt's leg positioned to comfort. Pt handed off to OT for early session.       Therapy Documentation Precautions:  Precautions Precautions: Fall Restrictions Weight Bearing Restrictions: No LLE Weight Bearing: Weight bearing as tolerated General: PT Amount of Missed Time (min): 14 Minutes PT Missed Treatment Reason: Other (Comment) (mtg conflict) Vital Signs: Therapy Vitals Temp: 98.5 F (36.9 C) Pulse Rate: (!) 58 Resp: 17 BP: (!) 120/59 Patient Position (if appropriate): Lying Oxygen Therapy SpO2: 100 % O2 Device: Room Air Pain: Pain Assessment Pain Scale: 0-10 Pain Score: 6  Pain Type: Acute pain;Chronic pain Pain Location: Hip Pain Orientation: Left Pain Descriptors / Indicators: Aching Pain Frequency: Constant Pain Onset: On-going Pain Intervention(s): Medication (See eMAR)   Therapy/Group: Individual Therapy    06/21/2023, 4:19 PM

## 2023-06-21 NOTE — Plan of Care (Signed)
  Problem: Consults Goal: RH GENERAL PATIENT EDUCATION Description: See Patient Education module for education specifics. Outcome: Progressing   Problem: RH BOWEL ELIMINATION Goal: RH STG MANAGE BOWEL WITH ASSISTANCE Description: STG Manage Bowel with min Assistance. Outcome: Progressing Goal: RH STG MANAGE BOWEL W/MEDICATION W/ASSISTANCE Description: STG Manage Bowel with Medication with min Assistance. Outcome: Progressing   Problem: RH SKIN INTEGRITY Goal: RH STG SKIN FREE OF INFECTION/BREAKDOWN Description: Incision will be free of infection with continued healing with min assist. Wounds will continue to heal and be free of infection and additional breakdown with min assist  Outcome: Progressing

## 2023-06-21 NOTE — Progress Notes (Signed)
Physical Therapy Session Note  Patient Details  Name: Dylan Bruce MRN: [id number] Date of Birth: 29-Sep-1946  Today's Date: 06/21/2023 PT Individual Time: 0865-7846 PT Individual Time Calculation (min): 27 min  and Today's Date: 06/21/2023 PT Missed Time: 33 Minutes Missed Time Reason: Pain  Short Term Goals: Week 1:  PT Short Term Goal 1 (Week 1): Pt will be able to perform bed mobility with mod assist PT Short Term Goal 2 (Week 1): Pt will be able to perform sit <> stands with mod assist PT Short Term Goal 3 (Week 1): Pt will be able to perform bed <> w/c transfers with mod assist PT Short Term Goal 4 (Week 1): Pt will be able to gait x 25' with min assist  Skilled Therapeutic Interventions/Progress Updates:      Therapy Documentation Precautions:  Precautions Precautions: Fall Restrictions Weight Bearing Restrictions: No LLE Weight Bearing: Weight bearing as tolerated   Pt received seated in w/c at bedside and reports discomfort to L LE, provided rest/ repositioning for relief. Pt requested assistance from PT with posterior peri-care as he reports it was not performed adequately prior with hospital staff. PT notified nurse and pt re-iterated he would prefer to be "wiped more" as he has buttock skin breakdown. Nurse assessed skin integrity and pt returned to bed for better positioning. Pt mod A with sit to stand and min A for stand pivot to bed. Pt mod A with sit to lying and requires min A for rolling. PT performed additional posterior peri-care per patient's request. Pt reports he initially wanted to practice walking but stated he needed to rest due to increased pain. Pt missed 33 minutes of skilled PT, plan to make up as able.    Therapy/Group: Individual Therapy  Truitt Leep Truitt Leep PT, DPT  06/21/2023, 7:59 AM

## 2023-06-21 NOTE — Progress Notes (Signed)
PROGRESS NOTE   Subjective/Complaints:   Pt appears less confused this AM- admits had been feeling a little loopy- but feels better this AM  Denies SOB this AM and overnight.   Pain "stable"- recognizes we are trying to keep dose as it is.   LBM 2-3 days ago.   Couldn't get into shower since too tired yesterday  Woke up at 2am with bad pain.     ROS:    Pt denies SOB, abd pain, CP, N/V/C/D, and vision changes   SOB improved  Except for HPI  Objective:   DG Chest 2 View  Result Date: 06/20/2023 CLINICAL DATA:  Shortness of breath EXAM: CHEST - 2 VIEW COMPARISON:  06/18/2023 FINDINGS: Unchanged mild cardiomegaly. Interval improvement of mild pulmonary vascular congestion. Postsurgical changes of aortic valve replacement, CABG and left shoulder replacement again seen. Interval improvement in aeration of the lungs with minimal opacity still remaining at the right lung base. IMPRESSION: Interval improvement in aeration of the lungs with minimal opacity still remaining at the right lung base most likely due to resolving pneumonia or pulmonary edema. Electronically Signed   By: Acquanetta Belling M.D.   On: 06/20/2023 15:59   Recent Labs    06/19/23 0519 06/20/23 0548  WBC 10.7* 9.0  HGB 8.1* 8.2*  HCT 26.3* 26.8*  PLT 483* 551*   Recent Labs    06/19/23 0519 06/20/23 0548  NA 132* 138  K 4.7 5.2*  CL 100 107  CO2 22 24  GLUCOSE 239* 172*  BUN 84* 67*  CREATININE 1.78* 1.45*  CALCIUM 8.3* 8.7*    Intake/Output Summary (Last 24 hours) at 06/21/2023 3086 Last data filed at 06/21/2023 5784 Gross per 24 hour  Intake 957 ml  Output 1950 ml  Net -993 ml     Pressure Injury 06/16/23 Sacrum Medial Stage 2 -  Partial thickness loss of dermis presenting as a shallow open injury with a red, pink wound bed without slough. small open area on coccyx in middle of gluteal cleft 1x1 cm (Active)  06/16/23 1848  Location:  Sacrum  Location Orientation: Medial  Staging: Stage 2 -  Partial thickness loss of dermis presenting as a shallow open injury with a red, pink wound bed without slough.  Wound Description (Comments): small open area on coccyx in middle of gluteal cleft 1x1 cm  Present on Admission: Yes    Physical Exam: Vital Signs Blood pressure (!) 169/64, pulse (!) 54, temperature 98 F (36.7 C), resp. rate 17, height 6\' 2"  (1.88 m), weight 98.6 kg, SpO2 97%.       General: awake, alert, appropriate, less confused- interactive much better;NAD HENT: conjugate gaze; oropharynx moist CV: bradycardic rate afib; no JVD Pulmonary: CTA B/L; no W/R/R- good air movement- slightly decreased at bases- better GI: soft, NT, ND, (+)BS Psychiatric: appropriate- less confused Neurological: alert     Cervical back: Tenderness present.     Comments: Psoriatic arthritis changes esp in hands DIPs are overflexed/contracted R>L and somewhat of MCPs somewhat flexed at rest- has hand contractures   R shoulder has ~ 20-25 degrees of active ROM max RUE- deltoid 2-/5; otherwise 4+/5 in RUE LUE  shoulder has >90 degrees and 4+/5 in LUE RLE- HF proximally 3-/5 and sitally 4/5 LLE- HF 2-/5; KE 2/5; and DF/PF 4/5-left hip pain improving   Skin:    General: Skin is warm and dry.     Comments:  Sacrum with small area of skin loss, mild blanchable erythema surrounding, no warmth, fluctuance or drainage. L elbow-small area of slough on most inferior part of wound, otherwise clean bases with minimal bleeding, mild bruising surrounding, no erythema, warmth, or edema. Moderate bruising at left hip, dressing clean, intact R shin-skin tear with small amount of bleeding, covered with Mepilex Bilateral upper extremity ecchymosis  Neurological:     Comments: Spina bifida changes to LE's/decreased  sensation   Assessment/Plan: 1. Functional deficits which require 3+ hours per day of interdisciplinary therapy in a  comprehensive inpatient rehab setting. Physiatrist is providing close team supervision and 24 hour management of active medical problems listed below. Physiatrist and rehab team continue to assess barriers to discharge/monitor patient progress toward functional and medical goals  Care Tool:  Bathing              Bathing assist Assist Level: Maximal Assistance - Patient 24 - 49%     Upper Body Dressing/Undressing Upper body dressing        Upper body assist Assist Level: Maximal Assistance - Patient 25 - 49%    Lower Body Dressing/Undressing Lower body dressing            Lower body assist Assist for lower body dressing: Total Assistance - Patient < 25%     Toileting Toileting    Toileting assist Assist for toileting: Total Assistance - Patient < 25%     Transfers Chair/bed transfer  Transfers assist     Chair/bed transfer assist level: Minimal Assistance - Patient > 75%     Locomotion Ambulation   Ambulation assist   Ambulation activity did not occur: Safety/medical concerns    Assistive device: Walker-rolling     Walk 10 feet activity   Assist  Walk 10 feet activity did not occur: Safety/medical concerns        Walk 50 feet activity   Assist Walk 50 feet with 2 turns activity did not occur: Safety/medical concerns         Walk 150 feet activity   Assist Walk 150 feet activity did not occur: Safety/medical concerns         Walk 10 feet on uneven surface  activity   Assist Walk 10 feet on uneven surfaces activity did not occur: Safety/medical concerns         Wheelchair     Assist Is the patient using a wheelchair?: Yes Type of Wheelchair: Manual    Wheelchair assist level: Dependent - Patient 0%      Wheelchair 50 feet with 2 turns activity    Assist        Assist Level: Dependent - Patient 0%   Wheelchair 150 feet activity     Assist      Assist Level: Dependent - Patient 0%   Blood  pressure (!) 169/64, pulse (!) 54, temperature 98 F (36.7 C), resp. rate 17, height 6\' 2"  (1.88 m), weight 98.6 kg, SpO2 97%.  Medical Problem List and Plan: 1. Functional deficits secondary to  L intertrochanteric hip fx s/p IM nail WBAT             -patient may  shower             -  ELOS/Goals: 10-14 days supervision             D/c 9/6  Con't CIR PT and OT- a little confused is better 2.  Antithrombotics: -DVT/anticoagulation:  Pharmaceutical: Eliquis             -antiplatelet therapy: Plavix    3. 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   No complaints of pain today, continue current regimen  8/13- said pain not controlled from hour 3-4- but declined Oxycontin for pain-stopped last dose of Nabumetone- since Cr up so much  8/14- won't change pain meds since more confused per staff- pneumonia vs chronic? Pt asking again for q3 hours prn meds- explained will not do this.   8/15- pt agreed to keep pain meds aq4 hours- educated has been a little confused- pt agreed 4. Mood/Behavior/Sleep: LCSW to evaluate and provide emotional support             -antipsychotic agents: Vilazodone 40 mg daily             -continue Remeron 30 mg q HS             -continue Provigil 100 mg daily     5. Neuropsych/cognition: This patient is capable of making decisions on his own behalf.   6. Skin/Wound Care: Routine skin care checks             -sacral pressure injury; continue local care/pressure relief             -monitor surgical incision             L elbow  and R lower leg wounds- local wound care  8-12: Dressing removed and wounds observed as above; no obvious source of infection, but if no other findings would be most likely soft tissue infection from left elbow or hip..  See below.  7. 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  8/13- CBGs running 199 to 265 in last 24 hours- since Semglee reduced. Don't feel comfortable adding PO meds since Cr up to 1.78 today  8/14-CBGs better- 132-199- con't regimen  8/15- CBGs running 125- 249 this AM- but had been doing better- will monitor trend 11: Venous stasis: RLE pre-tibial ulcer; continue local wound care   12: S/p left IM nail Dr. [name]             -weight bearing at tolerated   13: Sleep apnea: CPAP at 17 nightly   14: Left rib fractures: pain control and pulmonary toilet   15: Paroxysmal atrial fib: on Eliquis, Imdur, Demadex (rate controlled no other meds at home)   8/12: EKG with normal ventricular rate, however in atrial flutter, new diagnosis.  Not on rate controlling medications, cardiology consulted for input.. Getting IVF as below. Patient otherwise stable.    8/14- not new per chart- had pAfib- although pt said not aware (poor STM)- con't since rate appears controlled OFF meds- actually running 50s to 90s  8/15- HR in 50s-60s- so not appropriate for rate meds.  16: BPH: continue Flomax   17:Psoriasis/psoriatic arthritis: his family brought his Desonide 0.05% cream and clobetasol 0.05% cream to apply to hands, elbows, knees daily as needed             -If can get Cosentyx from home, can give to him- 2 weeks late, will make him hurt too much  8/11- educated pt don't have Cosentyx in hospital- will need ot bring form home 18: Rosacea: his family brought in his Soolantra (ivermectin) 1% cream to apply to face daily as needed   8/11- ordered by pharmacy today  8/15- meds brought in and sent to pharmacy 19: s/p CABG 2023   20: Osteoarthritis: S/p left shoulder arthroplasty; needs the right shoulder done as well- has R frozen shoulder as a result.    21. 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    8/11- BUN up to 60- very dry- and Cr up some- will give IVFs 60cc/hour after therapy til tomorrow and recheck labs in AM  8/12: Creatinine still increasing 1.99, along with increased potassium and downtrending sodium; increase fluid rate today to 1 L IV fluid at 75/h, urine studies pending, repeat BMP at 2 PM to ensure no further worsening -if creatinine increasing or K increasing, will consult nephrology at that time.  May need bicarbonate.  - Urinalysis normal, urine sodium and serum osmolality appear prerenal, awaiting urine osmolality; K responsive to IVF, continue for 1L, Cr/BUN stable will repeat in AM  8/13- Wil give another 12 hours of NS IVFs 100cc/hour- Cr down to 1.78 and BUN still 84-   8/14- Cr 1.45 down from 1.78 and BUN 67 down from 84- will give 1 more 12 hours of IVFs and recheck in AM  8/15- Labs pending  22. - Walked, but has Spina  bifida with frequent falls- will d/w therapy.    23. Leukocytosis - worsening  8/11- afebrile- feel good- will recheck in AM- if doesn't improve, will check U/A and Cx and CXR- having nurse check wounds as well- just to make sure-   -8-12: WBCs continue uptrending, 17.5 today, along with thrombocytosis and anemia.  Ordered urinalysis with reflex to  culture, chest x-ray, initiate IV fluids as above.   Urinalysis within normal limits   Chest x-ray pending - RLL infiltrate, significant from last CXR, concerning for aspiration pneumonia. Start IV Zosyn 4.75 mg Q8H for 5 days, SLP consult placed for swallow eval. Labs in AM    8/13- WBC down to 10.7 from 17.5- doing much better- con't Zosyn for pneumonia- asked pt to use ICS q2 hours while awake.   8/14- recheck in AM  8/15- WBC is pending-was 9.0-  from 10.7 from 17.5- SOB is much better this AM- and feels back to baseline 24. Constipation  8/15- Will give Sorbitol 30cc- if no large BM by then  25. Anemia  8/15- has dropped, however at the time it was higher, had been hemo-concentrated- and is now doing better- will monitor closely.   I spent a total of 39   minutes on total care today- >50% coordination of care- due to  D/w pt about confusion- SOB; and reviewing labs, vitals, CBGs, and notes/CXR from yesterday when felt SOB- also d/w nursing about confusion.    LOS: 5 days A FACE TO FACE EVALUATION WAS PERFORMED    06/21/2023, 8:22 AM    Addendum:

## 2023-06-22 LAB — GLUCOSE, CAPILLARY
Glucose-Capillary: 184 mg/dL — ABNORMAL HIGH (ref 70–99)
Glucose-Capillary: 190 mg/dL — ABNORMAL HIGH (ref 70–99)
Glucose-Capillary: 224 mg/dL — ABNORMAL HIGH (ref 70–99)
Glucose-Capillary: 160 mg/dL — ABNORMAL HIGH (ref 70–99)

## 2023-06-22 MED ORDER — HYDROXYZINE HCL 25 MG PO TABS
25.0000 mg | ORAL_TABLET | Freq: Two times a day (BID) | ORAL | Status: DC | PRN
  Administered 2023-06-22 – 2023-07-10 (×29): 25 mg via ORAL
  Filled 2023-06-22 (×31): qty 1

## 2023-06-22 MED ORDER — NYSTATIN 100000 UNIT/GM EX CREA
TOPICAL_CREAM | Freq: Two times a day (BID) | CUTANEOUS | Status: DC
  Administered 2023-06-22: 1 via TOPICAL
  Filled 2023-06-22: qty 30

## 2023-06-22 MED ORDER — TRIAMCINOLONE ACETONIDE 0.1 % EX CREA
TOPICAL_CREAM | Freq: Two times a day (BID) | CUTANEOUS | Status: DC
  Filled 2023-06-22: qty 15

## 2023-06-22 NOTE — Progress Notes (Signed)
Physical Therapy Session Note  Patient Details  Name: Dylan Bruce MRN: [id number] Date of Birth: August 31, 1946  Today's Date: 06/22/2023 PT Individual Time: 1110-1205 PT Individual Time Calculation (min): 55 min   Short Term Goals: Week 1:  PT Short Term Goal 1 (Week 1): Pt will be able to perform bed mobility with mod assist PT Short Term Goal 2 (Week 1): Pt will be able to perform sit <> stands with mod assist PT Short Term Goal 3 (Week 1): Pt will be able to perform bed <> w/c transfers with mod assist PT Short Term Goal 4 (Week 1): Pt will be able to gait x 25' with min assist  Skilled Therapeutic Interventions/Progress Updates: Patient in TIS on entrance to room. Patient alert and agreeable to PT session.   Patient reported having increased pain throughout the morning, and would attempt to do what he can for this session. Rest breaks provided prn and RN provided pain medication during session.   Therapeutic Activity: Bed Mobility: Pt performed sit to supine from EOB with maxA to elevate B LE's due to pain, and VC required to control trunk to avoid hitting head on R HOB rail. Transfers: Pt performed sit to stand to RW from TIS with light modA. VC required for pt to extend through hips. Pt ambulated several steps to EOB and reported feeling that therex performed prior to ambulating helped with decreasing pain in L LE.   Therapeutic Exercise: Pt performed the following exercises with VC for patient to inhale during eccentric, and exhale during concentric instead of holding breath, and to control eccentric portions. - HS curl 2 x 12 on L LE with yellow theraband (1 x 10 on R) - L LE LAQ with AAROM - B seated hip marches with PTA assisting with L LE to complete ROM with min/light modA (pt cued to control descent as able).  - Heel slides in bed with gait belt (pt to use personal cane to lasso around foot) and to perform throughout day when feeling stiff. Pt also providing self resistance  into extension. (Pt asked if there was something that he could do in the room whenever he get stiff in that leg. Pt also instructed to perform prior to standing to prime LE musculature)   Patient supine in bed at end of session with brakes locked, RN present, bed alarm set, and all needs within reach.      Therapy Documentation Precautions:  Precautions Precautions: Fall Restrictions Weight Bearing Restrictions: No LLE Weight Bearing: Weight bearing as tolerated  Therapy/Group: Individual Therapy  Maie Kesinger PTA 06/22/2023, 12:11 PM

## 2023-06-22 NOTE — Progress Notes (Signed)
Physical Therapy Session Note  Patient Details  Name: Dylan Bruce MRN: [id number] Date of Birth: 10-Oct-1946  Today's Date: 06/22/2023 PT Individual Time: 8315-1761 PT Individual Time Calculation (min): 48 min    Short Term Goals: Week 1:  PT Short Term Goal 1 (Week 1): Pt will be able to perform bed mobility with mod assist PT Short Term Goal 2 (Week 1): Pt will be able to perform sit <> stands with mod assist PT Short Term Goal 3 (Week 1): Pt will be able to perform bed <> w/c transfers with mod assist PT Short Term Goal 4 (Week 1): Pt will be able to gait x 25' with min assist  Skilled Therapeutic Interventions/Progress Updates:    Patient received in supine in bed and reports received medicine for pain ~1 hour ago. Pt noted to have damp shorts on in bed and lower body dressing completed to doff soiled short and don clean shorts. Min assist to flex Lt Le and pt flexing Rt to move into bridge position. Pt able to bridge up through bil LE Rt>Lt and pull soiled shorts down himself with multiple bridges. Max assist to thread pants off LE's and thread new short on. Pt able to bridge to don new short but min assist required to fully pull up back of shorts over hips. Max assist to roll towards Lt side and cues to use bed rail, pt lean back due to discomfort at Lt hip with move to sit upright and Max assist needed to fully lift trunk and obtain seated balance at EOB. Mod Assist required for sit<>stand from EOB and to guide turn with pivot bed to TIS WC. Pt dependently transported to main gym for therapeutic exercises. Pt ambulated ~10' with RW and Min assist from [facility] to mat table in gym with Min-mod assist to guide turn to sit on mat table. Pt completed 5x sit<>stand from mat table for whole task practice with cues to sequence hand placement and anterior trunk lean.  Standing balance activity completed for Lt LE AROM with pt using Lt foot to roll ball forward/backward from one dot to another. Pt  completed 5 reps with min assist to steady balance and RW for bil UE support. Pt required seated rest break and then completed dot forward dot taps with Rt LE to facilitate increased Lt Le weight bearing. Stand pivot transfer completed EOM>WC following seated rest break with Mod assist to rise and guide steps. Pt dependently transported back to room in TIS [facility] and agreeable to remain OOB in [facility] until next therapy visit. Alarm on and call bell within reach.    Therapy Documentation Precautions:  Precautions Precautions: Fall Restrictions Weight Bearing Restrictions: No LLE Weight Bearing: Weight bearing as tolerated  Pain:  Pt reports Lt hip pain around 8/10 following therapy. Pt premedicated and rest breaks provided. RN alerted of pain level at EOS.    Therapy/Group: Individual Therapy  Wynn Maudlin, DPT Acute Rehabilitation Services Office [phone]  06/22/23 7:40 AM

## 2023-06-22 NOTE — Progress Notes (Signed)
Occupational Therapy Session Note  Patient Details  Name: Dylan Bruce MRN: [id number] Date of Birth: 1945/12/04  Today's Date: 06/22/2023 OT Individual Time: 1324-4010 OT Individual Time Calculation (min): 60 min    Short Term Goals: Week 1:  OT Short Term Goal 1 (Week 1): Pt will sit EOB while completing ADLs with Max A OT Short Term Goal 2 (Week 1): Pt will complete LB dressing with Max A with AE as necessary OT Short Term Goal 3 (Week 1): Pt will complete UB dressing with Mod A  Skilled Therapeutic Interventions/Progress Updates:    Pt received supine with 6/10 pain in his L hip, agreeable to OT session. He reported he was premedicated and was agreeable to position change for pain intervention. He came to EOB with mod A for managing the LLE and trunk. Pt completed sit > stand with mod A from EOB with the RW. Stand pivot transfer with min A once on his feet. Pt was taken via w/c to the therapy gym for time management. Increased time to reposition LLE several times d/t pain ongoing. He stood from w/c with mod A using the RW. He completed 15 ft of functional mobility with the RW, progressing to only min A with very slow pace, short strides with est 50% weightbearing on the LLE. He required an extended rest break following, again with several minutes of repositioning the LLE. He completed 1 set of 10 chest press with a 5lb dowel to increase UE strength needed for weightbearing through the Rw during ADL transfers. He returned to his room, daughter now present. Utilized several pillows to attempt and elevate LLE for pain relief. He finally reported some relief and was left sitting up in the TIS w/c.    Therapy Documentation Precautions:  Precautions Precautions: Fall Restrictions Weight Bearing Restrictions: No LLE Weight Bearing: Weight bearing as tolerated   Therapy/Group: Individual Therapy  Crissie Reese 06/22/2023, 6:47 AM

## 2023-06-22 NOTE — Progress Notes (Signed)
PROGRESS NOTE   Subjective/Complaints:   Pt reports wondering about how to handle "creAM"= didn't appear ot want to tell me about anal fissure- just wanted meds ordered- finally explained why getting creams.   LBM yesterday AM Before sorbitol.  Breathing great    ROS:   Pt denies SOB, abd pain, CP, N/V/C/D, and vision changes  SOB improved  Except for HPI  Objective:   DG Chest 2 View  Result Date: 06/20/2023 CLINICAL DATA:  Shortness of breath EXAM: CHEST - 2 VIEW COMPARISON:  06/18/2023 FINDINGS: Unchanged mild cardiomegaly. Interval improvement of mild pulmonary vascular congestion. Postsurgical changes of aortic valve replacement, CABG and left shoulder replacement again seen. Interval improvement in aeration of the lungs with minimal opacity still remaining at the right lung base. IMPRESSION: Interval improvement in aeration of the lungs with minimal opacity still remaining at the right lung base most likely due to resolving pneumonia or pulmonary edema. Electronically Signed   By: Acquanetta Belling M.D.   On: 06/20/2023 15:59   Recent Labs    06/20/23 0548 06/21/23 0905  WBC 9.0 10.8*  HGB 8.2* 8.8*  HCT 26.8* 28.5*  PLT 551* 640*   Recent Labs    06/20/23 0548 06/21/23 0905  NA 138 138  K 5.2* 5.0  CL 107 108  CO2 24 24  GLUCOSE 172* 288*  BUN 67* 48*  CREATININE 1.45* 1.35*  CALCIUM 8.7* 8.9    Intake/Output Summary (Last 24 hours) at 06/22/2023 0908 Last data filed at 06/22/2023 0902 Gross per 24 hour  Intake 836 ml  Output 3400 ml  Net -2564 ml     Pressure Injury 06/16/23 Sacrum Medial Stage 2 -  Partial thickness loss of dermis presenting as a shallow open injury with a red, pink wound bed without slough. small open area on coccyx in middle of gluteal cleft 1x1 cm (Active)  06/16/23 1848  Location: Sacrum  Location Orientation: Medial  Staging: Stage 2 -  Partial thickness loss of dermis  presenting as a shallow open injury with a red, pink wound bed without slough.  Wound Description (Comments): small open area on coccyx in middle of gluteal cleft 1x1 cm  Present on Admission: Yes    Physical Exam: Vital Signs Blood pressure (!) 142/64, pulse 67, temperature 98.7 F (37.1 C), resp. rate 18, height 6\' 2"  (1.88 m), weight 97.8 kg, SpO2 98%.       General: awake, alert, appropriate,  sitting up in bed; NAD HENT: conjugate gaze; oropharynx moist CV: regular rate- rate controlled- Afib; ; no JVD Pulmonary: CTA B/L; no W/R/R- good air movement GI: soft, NT, ND, (+)BS- more normoactive Psychiatric: appropriate Neurological: Ox3- able ot have discussion about meds form home, a little delayed on responses- stable    Cervical back: Tenderness present.     Comments: Psoriatic arthritis changes esp in hands DIPs are overflexed/contracted R>L and somewhat of MCPs somewhat flexed at rest- has hand contractures   R shoulder has ~ 20-25 degrees of active ROM max RUE- deltoid 2-/5; otherwise 4+/5 in RUE LUE shoulder has >90 degrees and 4+/5 in LUE RLE- HF proximally 3-/5 and sitally 4/5 LLE- HF 2-/5;  KE 2/5; and DF/PF 4/5-left hip pain improving   Skin:    General: Skin is warm and dry.     Comments:  Sacrum with small area of skin loss, mild blanchable erythema surrounding, no warmth, fluctuance or drainage. L elbow-small area of slough on most inferior part of wound, otherwise clean bases with minimal bleeding, mild bruising surrounding, no erythema, warmth, or edema. Moderate bruising at left hip, dressing clean, intact R shin-skin tear with small amount of bleeding, covered with Mepilex Bilateral upper extremity ecchymosis  Neurological:     Comments: Spina bifida changes to LE's/decreased  sensation   Assessment/Plan: 1. Functional deficits which require 3+ hours per day of interdisciplinary therapy in a comprehensive inpatient rehab setting. Physiatrist is  providing close team supervision and 24 hour management of active medical problems listed below. Physiatrist and rehab team continue to assess barriers to discharge/monitor patient progress toward functional and medical goals  Care Tool:  Bathing              Bathing assist Assist Level: Maximal Assistance - Patient 24 - 49%     Upper Body Dressing/Undressing Upper body dressing        Upper body assist Assist Level: Maximal Assistance - Patient 25 - 49%    Lower Body Dressing/Undressing Lower body dressing            Lower body assist Assist for lower body dressing: Total Assistance - Patient < 25%     Toileting Toileting    Toileting assist Assist for toileting: Total Assistance - Patient < 25%     Transfers Chair/bed transfer  Transfers assist     Chair/bed transfer assist level: Minimal Assistance - Patient > 75%     Locomotion Ambulation   Ambulation assist   Ambulation activity did not occur: Safety/medical concerns    Assistive device: Walker-rolling     Walk 10 feet activity   Assist  Walk 10 feet activity did not occur: Safety/medical concerns        Walk 50 feet activity   Assist Walk 50 feet with 2 turns activity did not occur: Safety/medical concerns         Walk 150 feet activity   Assist Walk 150 feet activity did not occur: Safety/medical concerns         Walk 10 feet on uneven surface  activity   Assist Walk 10 feet on uneven surfaces activity did not occur: Safety/medical concerns         Wheelchair     Assist Is the patient using a wheelchair?: Yes Type of Wheelchair: Manual    Wheelchair assist level: Dependent - Patient 0%      Wheelchair 50 feet with 2 turns activity    Assist        Assist Level: Dependent - Patient 0%   Wheelchair 150 feet activity     Assist      Assist Level: Dependent - Patient 0%   Blood pressure (!) 142/64, pulse 67, temperature 98.7 F (37.1  C), resp. rate 18, height 6\' 2"  (1.88 m), weight 97.8 kg, SpO2 98%.  Medical Problem List and Plan: 1. Functional deficits secondary to  L intertrochanteric hip fx s/p IM nail WBAT             -patient may  shower             -ELOS/Goals: 10-14 days supervision             D/c  9/6  Con't CIR PT and OT 2.  Antithrombotics: -DVT/anticoagulation:  Pharmaceutical: Eliquis             -antiplatelet therapy: Plavix    3. 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   No complaints of pain today, continue current regimen  8/13- said pain not controlled from hour 3-4- but declined Oxycontin for pain-stopped last dose of Nabumetone- since Cr up so much  8/14- won't change pain meds since more confused per staff- pneumonia vs chronic? Pt asking again for q3 hours prn meds- explained will not do this.   8/15- pt agreed to keep pain meds aq4 hours- educated has been a little confused- pt agreed 4. Mood/Behavior/Sleep: LCSW to evaluate and provide emotional support             -antipsychotic agents: Vilazodone 40 mg daily             -continue Remeron 30 mg q HS             -continue Provigil 100 mg daily   8/16- will increase Vistaril to BID prn   5. Neuropsych/cognition: This patient is capable of making decisions on his own behalf.   6. Skin/Wound Care: Routine skin care checks             -sacral pressure injury; continue local care/pressure relief             -monitor surgical incision             L elbow and R lower leg wounds- local wound care  8-12:  Dressing removed and wounds observed as above; no obvious source of infection, but if no other findings would be most likely soft tissue infection from left elbow or hip..  See below.  7. 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  8/13- CBGs running 199 to 265 in last 24 hours- since Semglee reduced. Don't feel comfortable adding PO meds since Cr up to 1.78 today  8/14-CBGs better- 132-199- con't regimen  8/15- CBGs running 125- 249 this AM- but had been doing better- will monitor trend  8/16- CBG's 105-184- doing much better 11: Venous stasis: RLE pre-tibial ulcer; continue local wound care   12: S/p left IM nail Dr. [name]             -weight bearing at tolerated   13: Sleep apnea: CPAP at 17 nightly   14: Left rib fractures: pain control and pulmonary toilet   15: Paroxysmal atrial fib: on Eliquis, Imdur, Demadex (rate controlled no other meds at home)   8/12: EKG with normal ventricular rate, however in atrial flutter, new diagnosis.  Not on rate controlling medications, cardiology consulted for input.. Getting IVF as below. Patient otherwise stable.   8/14- not  new per chart- had pAfib- although pt said not aware (poor STM)- con't since rate appears controlled OFF meds- actually running 50s to 90s  8/15- HR in 50s-60s- so not appropriate for rate meds.  16: BPH: continue Flomax   17:Psoriasis/psoriatic arthritis: his family brought his Desonide 0.05% cream and clobetasol 0.05% cream to apply to hands, elbows, knees daily as needed             -If can get Cosentyx from home, can give to him- 2 weeks late, will make him hurt too much  8/11- educated pt don't have Cosentyx in hospital- will need ot bring form home 18: Rosacea: his family brought in his Soolantra (ivermectin) 1% cream to apply to face daily as needed   8/11- ordered by pharmacy today  8/15- meds brought in and sent to pharmacy 19: s/p CABG 2023   20: Osteoarthritis: S/p left shoulder arthroplasty; needs the right shoulder done as well- has R frozen shoulder as a result.    21. 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    8/11- BUN up to 60- very dry- and Cr up some- will give IVFs 60cc/hour after therapy til tomorrow and recheck labs in AM  8/12: Creatinine still increasing 1.99, along with increased potassium and downtrending sodium; increase fluid rate today to 1 L IV fluid at 75/h, urine studies pending, repeat BMP at 2 PM to ensure no further worsening -if creatinine increasing or K increasing, will consult nephrology at that time.  May need bicarbonate.  - Urinalysis normal, urine sodium and serum osmolality appear prerenal, awaiting urine osmolality; K responsive to IVF, continue for 1L, Cr/BUN stable will repeat in AM  8/13- Wil give another 12 hours of NS IVFs 100cc/hour- Cr down to 1.78 and BUN still 84-   8/14- Cr 1.45 down from 1.78 and BUN 67 down from 84- will give 1 more 12 hours of IVFs and recheck in AM   8/16- labs came back late- Cr 1.35 and BUN 48- improving-  con't to push PO fluids.  22. - Walked, but has Spina bifida with frequent falls- will d/w therapy.    23. Leukocytosis - worsening  8/11- afebrile- feel good- will recheck in AM- if doesn't improve, will check U/A and Cx and CXR- having nurse check wounds as well- just to make sure-   -8-12: WBCs continue uptrending, 17.5 today, along with thrombocytosis and anemia.  Ordered urinalysis with reflex to culture, chest x-ray, initiate IV fluids as above.   Urinalysis within normal limits   Chest x-ray pending - RLL infiltrate, significant from last CXR, concerning for aspiration pneumonia. Start IV Zosyn 4.75 mg Q8H for 5 days, SLP consult placed for swallow eval. Labs in AM    8/13- WBC down to 10.7 from 17.5- doing much better- con't Zosyn for pneumonia- asked pt to use ICS q2 hours while awake.   8/14- recheck in AM  8/15- WBC is pending-was 9.0-  from 10.7 from 17.5- SOB is much better this AM- and feels back to baseline  8/16- WBC 10.8- stable.  24. Constipation  8/15- Will give Sorbitol 30cc- if no large BM by then  8/16- Large BM yesterday AM before Sorbitol   25. Anemia  8/15- has dropped, however at the time it was higher, had been hemo-concentrated- and is now doing better- will monitor closely.   8/16- Hb back up to 8.8- doing better 26. Anal fissure  8/16- will order/d/w nursing meds form home for anal fissure- since we have in hospital.    I spent a total of  52  minutes on total care today- >50% coordination of care- due to  D/w pt about creams for anal fissure- thought creams were for MASD- but was unclear- for anus- will get started- also pt asking for Vistaril BID prn- will order- also d/w nursing x3 about creams-   LOS: 6 days A FACE TO FACE EVALUATION WAS PERFORMED  Ziyana Morikawa 06/22/2023, 9:08 AM    Addendum:

## 2023-06-22 NOTE — Plan of Care (Signed)
  Problem: Consults Goal: [facility] GENERAL PATIENT EDUCATION Description: See Patient Education module for education specifics. Outcome: Progressing   

## 2023-06-22 NOTE — Progress Notes (Signed)
Physical Therapy Session Note  Patient Details  Name: Dylan Bruce MRN: [id number] Date of Birth: 03/16/1946  Today's Date: 06/22/2023 PT Individual Time: 0950-1030 PT Individual Time Calculation (min): 40 min   Short Term Goals: Week 1:  PT Short Term Goal 1 (Week 1): Pt will be able to perform bed mobility with mod assist PT Short Term Goal 2 (Week 1): Pt will be able to perform sit <> stands with mod assist PT Short Term Goal 3 (Week 1): Pt will be able to perform bed <> w/c transfers with mod assist PT Short Term Goal 4 (Week 1): Pt will be able to gait x 25' with min assist  Skilled Therapeutic Interventions/Progress Updates: Pt presented in [facility] c/o 9/10 pain in RLE and hip. Pt premedicated and unable to receive additional medication at this time. PTA attempted varying use of pillows, stools and leg rests to find more comfortable position for pt. Pt initially agreeable to seated therex/ROM however became discouraged due to pain. PTA the provided education on surgery, recovery, pain management, and showed pt x-rays of hardware with pt demonstrating some insight into why he has high levels of pain and location. Pt also then instructed in importance of mobility (even when in bed) for pain management with PTA instructing pt and pt able to demonstrate ankle pumps, quad sets, and glute sets. Pt verbalized understanding and agreeable to try exercises. Pt left in recliner at end of session with leg resting on pillows, call bell within reach and needs met.      Therapy Documentation Precautions:  Precautions Precautions: Fall Restrictions Weight Bearing Restrictions: No LLE Weight Bearing: Weight bearing as tolerated General:   Vital Signs: Therapy Vitals Temp: 98.3 F (36.8 C) Temp Source: Oral Pulse Rate: 67 Resp: 18 BP: 133/68 Patient Position (if appropriate): Lying Oxygen Therapy SpO2: 100 % O2 Device: Room Air Pain: Pain Assessment Pain Scale: 0-10 Pain Score: 7  Pain  Type: Acute pain Pain Location: Hip Pain Orientation: Left Pain Descriptors / Indicators: Aching Pain Frequency: Constant Pain Onset: On-going Patients Stated Pain Goal: 0 Pain Intervention(s): Medication (See eMAR) Multiple Pain Sites: No     Therapy/Group: Individual Therapy  [name] 06/22/2023, 4:21 PM

## 2023-06-23 LAB — GLUCOSE, CAPILLARY
Glucose-Capillary: 78 mg/dL (ref 70–99)
Glucose-Capillary: 202 mg/dL — ABNORMAL HIGH (ref 70–99)
Glucose-Capillary: 191 mg/dL — ABNORMAL HIGH (ref 70–99)
Glucose-Capillary: 127 mg/dL — ABNORMAL HIGH (ref 70–99)

## 2023-06-23 MED ORDER — INSULIN GLARGINE-YFGN 100 UNIT/ML ~~LOC~~ SOLN
22.0000 [IU] | Freq: Two times a day (BID) | SUBCUTANEOUS | Status: DC
  Administered 2023-06-23 – 2023-07-05 (×24): 22 [IU] via SUBCUTANEOUS
  Filled 2023-06-23 (×25): qty 0.22

## 2023-06-23 MED ORDER — ALBUTEROL SULFATE (2.5 MG/3ML) 0.083% IN NEBU
2.5000 mg | INHALATION_SOLUTION | Freq: Four times a day (QID) | RESPIRATORY_TRACT | Status: DC | PRN
  Administered 2023-07-05 – 2023-07-06 (×2): 2.5 mg via RESPIRATORY_TRACT
  Filled 2023-06-23 (×2): qty 3

## 2023-06-23 NOTE — Progress Notes (Addendum)
PROGRESS NOTE   Subjective/Complaints:  No pain c/os, discussed DM management  as well as last day of IV abx and normalizing WBCs   ROS:   Pt denies SOB, abd pain, CP, N/V/C/D, and vision changes  SOB improved  Except for HPI  Objective:   No results found. Recent Labs    06/21/23 0905  WBC 10.8*  HGB 8.8*  HCT 28.5*  PLT 640*   Recent Labs    06/21/23 0905  NA 138  K 5.0  CL 108  CO2 24  GLUCOSE 288*  BUN 48*  CREATININE 1.35*  CALCIUM 8.9    Intake/Output Summary (Last 24 hours) at 06/23/2023 0830 Last data filed at 06/23/2023 0732 Gross per 24 hour  Intake 1252 ml  Output 3875 ml  Net -2623 ml     Pressure Injury 06/16/23 Sacrum Medial Stage 2 -  Partial thickness loss of dermis presenting as a shallow open injury with a red, pink wound bed without slough. small open area on coccyx in middle of gluteal cleft 1x1 cm (Active)  06/16/23 1848  Location: Sacrum  Location Orientation: Medial  Staging: Stage 2 -  Partial thickness loss of dermis presenting as a shallow open injury with a red, pink wound bed without slough.  Wound Description (Comments): small open area on coccyx in middle of gluteal cleft 1x1 cm  Present on Admission: Yes    Physical Exam: Vital Signs Blood pressure (!) 146/88, pulse 66, temperature 98.4 F (36.9 C), temperature source Oral, resp. rate 18, height 6\' 2"  (1.88 m), weight 97.8 kg, SpO2 98%.   General: No acute distress Mood and affect are appropriate Heart: Regular rate and rhythm no rubs murmurs or extra sounds Lungs: Clear to auscultation, breathing unlabored, no rales or wheezes Abdomen: Positive bowel sounds, soft nontender to palpation, nondistended Extremities: No clubbing, cyanosis, or edema   Cervical back: Tenderness present.     Comments: Psoriatic arthritis changes esp in hands    R shoulder has ~ 20-25 degrees of active ROM max RUE- deltoid 2-/5;  otherwise 4+/5 in RUE LUE shoulder has >90 degrees and 4+/5 in LUE RLE- HF proximally 3-/5 and sitally 4/5 LLE- HF 2-/5; KE 2/5; and DF/PF 4/5-unchanged 8/17    Assessment/Plan: 1. Functional deficits which require 3+ hours per day of interdisciplinary therapy in a comprehensive inpatient rehab setting. Physiatrist is providing close team supervision and 24 hour management of active medical problems listed below. Physiatrist and rehab team continue to assess barriers to discharge/monitor patient progress toward functional and medical goals  Care Tool:  Bathing              Bathing assist Assist Level: Maximal Assistance - Patient 24 - 49%     Upper Body Dressing/Undressing Upper body dressing        Upper body assist Assist Level: Maximal Assistance - Patient 25 - 49%    Lower Body Dressing/Undressing Lower body dressing            Lower body assist Assist for lower body dressing: Total Assistance - Patient < 25%     Toileting Toileting    Toileting assist Assist for toileting: Total  Assistance - Patient < 25%     Transfers Chair/bed transfer  Transfers assist     Chair/bed transfer assist level: Minimal Assistance - Patient > 75%     Locomotion Ambulation   Ambulation assist   Ambulation activity did not occur: Safety/medical concerns    Assistive device: Walker-rolling     Walk 10 feet activity   Assist  Walk 10 feet activity did not occur: Safety/medical concerns        Walk 50 feet activity   Assist Walk 50 feet with 2 turns activity did not occur: Safety/medical concerns         Walk 150 feet activity   Assist Walk 150 feet activity did not occur: Safety/medical concerns         Walk 10 feet on uneven surface  activity   Assist Walk 10 feet on uneven surfaces activity did not occur: Safety/medical concerns         Wheelchair     Assist Is the patient using a wheelchair?: Yes Type of Wheelchair: Manual     Wheelchair assist level: Dependent - Patient 0%      Wheelchair 50 feet with 2 turns activity    Assist        Assist Level: Dependent - Patient 0%   Wheelchair 150 feet activity     Assist      Assist Level: Dependent - Patient 0%   Blood pressure (!) 146/88, pulse 66, temperature 98.4 F (36.9 C), temperature source Oral, resp. rate 18, height 6\' 2"  (1.88 m), weight 97.8 kg, SpO2 98%.  Medical Problem List and Plan: 1. Functional deficits secondary to  L intertrochanteric hip fx s/p IM nail WBAT             -patient may  shower             -ELOS/Goals: 10-14 days supervision             D/c 9/6  Con't CIR PT and OT 2.  Antithrombotics: -DVT/anticoagulation:  Pharmaceutical: Eliquis             -antiplatelet therapy: Plavix    3. 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   No complaints of pain today, continue current regimen  8/13- said pain not controlled from hour 3-4- but declined Oxycontin for pain-stopped last dose of Nabumetone- since Cr up so much  8/14- won't change pain meds since more confused per staff- pneumonia vs chronic? Pt asking again for q3 hours prn meds- explained will not do this.   8/15- pt agreed to keep pain meds aq4 hours- educated has been a little confused- pt agreed 4. Mood/Behavior/Sleep: LCSW to evaluate and provide emotional support             -antipsychotic agents: Vilazodone 40 mg daily             -  continue Remeron 30 mg q HS             -continue Provigil 100 mg daily   8/16- will  increase Vistaril to BID prn   5. Neuropsych/cognition: This patient is capable of making decisions on his own behalf.   6. Skin/Wound Care: Routine skin care checks             -sacral pressure injury; continue local care/pressure relief             -monitor surgical incision             L elbow and R lower leg wounds- local wound care  8-12: Dressing removed and wounds observed as above; no obvious source of infection, but if no other findings would be most likely soft tissue infection from left elbow or hip..  See below.  7. Fluids/Electrolytes/Nutrition: Routine Is and Os and follow-up chemistries             -continue Mag-ox 400 mg daily   8: Hypertension: monitor TID and prn -Continue Imdur 30 mg daily -continue Demadex 20 mg daily   Vitals:   06/22/23 2036 06/23/23 0315  BP:  (!) 146/88  Pulse:  66  Resp:  18  Temp:  98.4 F (36.9 C)  SpO2: 98% 98%   Fair control 8/17 9: Hyperlipidemia: continue statin, Zetia   10: DM-insulin dependent: CBGs QID; A1c = 8.5% on 02/20/2023 (Jardiance 10 mg, Metformin 1000 mg BID, Ozempic, Lantus and SSI with meals at home)             -continue SSI             -continue Novolog 12 units with meals             -continue Semglee 25 units BID   8/11- CBG's 150s-300s- will increase Semglee to 28 units BID- might need more titration on Monday;   8/12: Overnight persistent low 50s requiring half an ampule of D50 to increase; reduce Semglee back to 25 units twice daily, appears overall has down trended since admission.  8/13- CBGs running 199 to 265 in last 24 hours- since Semglee reduced. Don't feel comfortable adding PO meds since Cr up to 1.78 today  CBG (last 3)  Recent Labs    06/22/23 1630 06/22/23 2114 06/23/23 0606  GLUCAP 224* 160* 78  Reduce semglee , cannot use metformin, also allergies preclude glimipride , consider jardiance, took metformin at home may restart if creat 1.2 or better   11: Venous stasis: RLE pre-tibial ulcer;  continue local wound care   12: S/p left IM nail Dr. [name]             -weight bearing at tolerated   13: Sleep apnea: CPAP at 17 nightly   14: Left rib fractures: pain control and pulmonary toilet   15: Paroxysmal atrial fib: on Eliquis, Imdur, Demadex (rate controlled no other meds at home)   8/12: EKG with normal ventricular rate, however in atrial flutter, new diagnosis.  Not on rate controlling medications, cardiology consulted for input.. Getting IVF as below. Patient otherwise stable.   8/14- not new per chart- had pAfib- although pt said not aware (poor STM)- con't since rate appears controlled OFF meds- actually running 50s to 90s  8/15- HR in 50s-60s- so not appropriate for rate meds.  16: BPH: continue Flomax   17:Psoriasis/psoriatic arthritis: his family brought his Desonide 0.05% cream and clobetasol 0.05% cream to apply to hands,  elbows, knees daily as needed             -If can get Cosentyx from home, can give to him- 2 weeks late, will make him hurt too much  8/11- educated pt don't have Cosentyx in hospital- will need ot bring form home 18: Rosacea: his family brought in his Soolantra (ivermectin) 1% cream to apply to face daily as needed   8/11- ordered by pharmacy today  8/15- meds brought in and sent to pharmacy 19: s/p CABG 2023   20: Osteoarthritis: S/p left shoulder arthroplasty; needs the right shoulder done as well- has R frozen shoulder as a result.    21. 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    8/11- BUN up to 60- very dry- and Cr up some- will give IVFs 60cc/hour after therapy til tomorrow and recheck labs in AM  8/12: Creatinine still increasing 1.99, along with increased potassium and downtrending sodium; increase fluid rate today to 1 L IV fluid at 75/h, urine studies pending, repeat BMP at 2 PM to ensure no further worsening -if creatinine  increasing or K increasing, will consult nephrology at that time.  May need bicarbonate.  - Urinalysis normal, urine sodium and serum osmolality appear prerenal, awaiting urine osmolality; K responsive to IVF, continue for 1L, Cr/BUN stable will repeat in AM  8/13- Wil give another 12 hours of NS IVFs 100cc/hour- Cr down to 1.78 and BUN still 84-   8/14- Cr 1.45 down from 1.78 and BUN 67 down from 84- will give 1 more 12 hours of IVFs and recheck in AM   8/16- labs came back late- Cr 1.35 and BUN 48- improving- con't to push PO fluids.  22. - Walked, but has Spina bifida with frequent falls- will d/w therapy.    23. Leukocytosis - resolved now on last day of abx   8/11- afebrile- feel good- will recheck in AM- if doesn't improve, will check U/A and Cx and CXR- having nurse check wounds as well- just to make sure-   -8-12: WBCs continue uptrending, 17.5 today, along with thrombocytosis and anemia.  Ordered urinalysis with reflex to culture, chest x-ray, initiate IV fluids as above.   Urinalysis within normal limits   Chest x-ray pending - RLL infiltrate, significant from last CXR, concerning for aspiration pneumonia. Start IV Zosyn 4.75 mg Q8H for 5 days, SLP consult placed for swallow eval. Labs in AM    8/13- WBC down to 10.7 from 17.5- doing much better- con't Zosyn for pneumonia- asked pt to use ICS q2 hours while awake.   8/14- recheck in AM  8/15- WBC is pending-was 9.0-  from 10.7 from 17.5- SOB is much better this AM- and feels back to baseline  8/16- WBC 10.8- stable.  24. Constipation  8/15- Will give Sorbitol 30cc- if no large BM by then  8/16- Large BM yesterday AM before Sorbitol   25. Anemia  8/15- has dropped, however at the time it was higher, had been hemo-concentrated- and is now doing better- will monitor closely.   8/16- Hb back up to 8.8- doing better 26. Anal fissure  8/16- will order/d/w nursing meds form home for anal fissure- since we have in hospital.    LOS: 7  days A FACE TO FACE EVALUATION WAS PERFORMED  Erick Colace 06/23/2023, 8:30 AM    Addendum:

## 2023-06-24 LAB — GLUCOSE, CAPILLARY
Glucose-Capillary: 140 mg/dL — ABNORMAL HIGH (ref 70–99)
Glucose-Capillary: 122 mg/dL — ABNORMAL HIGH (ref 70–99)
Glucose-Capillary: 249 mg/dL — ABNORMAL HIGH (ref 70–99)
Glucose-Capillary: 156 mg/dL — ABNORMAL HIGH (ref 70–99)

## 2023-06-24 MED ORDER — FAMOTIDINE 20 MG PO TABS
20.0000 mg | ORAL_TABLET | Freq: Every day | ORAL | Status: DC
  Administered 2023-06-24 – 2023-07-09 (×15): 20 mg via ORAL
  Filled 2023-06-24 (×15): qty 1

## 2023-06-24 NOTE — Progress Notes (Addendum)
PROGRESS NOTE   Subjective/Complaints:  Discussed therapy, team conf , plavix and protonix issues  ROS:   Pt denies SOB, abd pain, CP, N/V/C/D, and vision changes  SOB improved  Except for HPI  Objective:   No results found. Recent Labs    06/21/23 0905  WBC 10.8*  HGB 8.8*  HCT 28.5*  PLT 640*   Recent Labs    06/21/23 0905  NA 138  K 5.0  CL 108  CO2 24  GLUCOSE 288*  BUN 48*  CREATININE 1.35*  CALCIUM 8.9    Intake/Output Summary (Last 24 hours) at 06/24/2023 0746 Last data filed at 06/24/2023 1610 Gross per 24 hour  Intake 1076 ml  Output 2300 ml  Net -1224 ml     Pressure Injury 06/16/23 Sacrum Medial Stage 2 -  Partial thickness loss of dermis presenting as a shallow open injury with a red, pink wound bed without slough. small open area on coccyx in middle of gluteal cleft 1x1 cm (Active)  06/16/23 1848  Location: Sacrum  Location Orientation: Medial  Staging: Stage 2 -  Partial thickness loss of dermis presenting as a shallow open injury with a red, pink wound bed without slough.  Wound Description (Comments): small open area on coccyx in middle of gluteal cleft 1x1 cm  Present on Admission: Yes    Physical Exam: Vital Signs Blood pressure (!) 149/67, pulse (!) 57, temperature 98.3 F (36.8 C), resp. rate 18, height 6\' 2"  (1.88 m), weight 97.3 kg, SpO2 100%.   General: No acute distress Mood and affect are appropriate Heart: Regular rate and rhythm no rubs murmurs or extra sounds Lungs: Clear to auscultation, breathing unlabored, no rales or wheezes Abdomen: Positive bowel sounds, soft nontender to palpation, nondistended Extremities: No clubbing, cyanosis, or edema   Cervical back: Tenderness present.     Comments: Psoriatic arthritis changes esp in hands    R shoulder has ~ 20-25 degrees of active ROM max RUE- deltoid 2-/5; otherwise 4+/5 in RUE LUE shoulder has >90 degrees and  4+/5 in LUE RLE- HF proximally 3-/5 and sitally 4/5 LLE- HF 2-/5; KE 2/5; and DF/PF 4/5-unchanged 8/18    Assessment/Plan: 1. Functional deficits which require 3+ hours per day of interdisciplinary therapy in a comprehensive inpatient rehab setting. Physiatrist is providing close team supervision and 24 hour management of active medical problems listed below. Physiatrist and rehab team continue to assess barriers to discharge/monitor patient progress toward functional and medical goals  Care Tool:  Bathing              Bathing assist Assist Level: Maximal Assistance - Patient 24 - 49%     Upper Body Dressing/Undressing Upper body dressing        Upper body assist Assist Level: Maximal Assistance - Patient 25 - 49%    Lower Body Dressing/Undressing Lower body dressing            Lower body assist Assist for lower body dressing: Total Assistance - Patient < 25%     Toileting Toileting    Toileting assist Assist for toileting: Total Assistance - Patient < 25%     Transfers Chair/bed transfer  Transfers assist     Chair/bed transfer assist level: Minimal Assistance - Patient > 75%     Locomotion Ambulation   Ambulation assist   Ambulation activity did not occur: Safety/medical concerns    Assistive device: Walker-rolling     Walk 10 feet activity   Assist  Walk 10 feet activity did not occur: Safety/medical concerns        Walk 50 feet activity   Assist Walk 50 feet with 2 turns activity did not occur: Safety/medical concerns         Walk 150 feet activity   Assist Walk 150 feet activity did not occur: Safety/medical concerns         Walk 10 feet on uneven surface  activity   Assist Walk 10 feet on uneven surfaces activity did not occur: Safety/medical concerns         Wheelchair     Assist Is the patient using a wheelchair?: Yes Type of Wheelchair: Manual    Wheelchair assist level: Dependent - Patient 0%       Wheelchair 50 feet with 2 turns activity    Assist        Assist Level: Dependent - Patient 0%   Wheelchair 150 feet activity     Assist      Assist Level: Dependent - Patient 0%   Blood pressure (!) 149/67, pulse (!) 57, temperature 98.3 F (36.8 C), resp. rate 18, height 6\' 2"  (1.88 m), weight 97.3 kg, SpO2 100%.  Medical Problem List and Plan: 1. Functional deficits secondary to  L intertrochanteric hip fx s/p IM nail WBAT             -patient may  shower             -ELOS/Goals: 10-14 days supervision             D/c 9/6  Con't CIR PT and OT 2.  Antithrombotics: -DVT/anticoagulation:  Pharmaceutical: Eliquis             -antiplatelet therapy: Plavix , change protonix to pepcid as this will help efficacy    3. 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   No complaints of pain today, continue current regimen  8/13- said pain not controlled from hour 3-4- but declined Oxycontin for pain-stopped last dose of Nabumetone- since Cr up so much  8/14- won't change pain meds since more confused per staff- pneumonia vs chronic? Pt asking again for q3 hours prn meds- explained will not do this.   8/15- pt agreed to keep pain meds aq4 hours- educated has been a little confused- pt agreed 4. Mood/Behavior/Sleep: LCSW to evaluate and provide emotional support             -antipsychotic agents: Vilazodone 40 mg daily             -  continue Remeron 30 mg q HS             -continue Provigil 100 mg daily   8/16- will increase Vistaril to  BID prn   5. Neuropsych/cognition: This patient is capable of making decisions on his own behalf.   6. Skin/Wound Care: Routine skin care checks             -sacral pressure injury; continue local care/pressure relief             -monitor surgical incision             L elbow and R lower leg wounds- local wound care  8-12: Dressing removed and wounds observed as above; no obvious source of infection, but if no other findings would be most likely soft tissue infection from left elbow or hip..  See below.  7. 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  8/13- CBGs running 199 to 265 in last 24 hours- since Semglee reduced. Don't feel comfortable adding PO meds since Cr up to 1.78 today  CBG (last 3)  Recent Labs    06/23/23 1629 06/23/23 2156 06/24/23 0606  GLUCAP 191* 127* 140*  Reduce semglee , cannot use metformin, also allergies preclude glimipride , consider jardiance, took metformin at home may restart if creat 1.2 or better   11: Venous stasis: RLE  pre-tibial ulcer; continue local wound care   12: S/p left IM nail Dr. [name]             -weight bearing at tolerated   13: Sleep apnea: CPAP at 17 nightly   14: Left rib fractures: pain control and pulmonary toilet   15: Paroxysmal atrial fib: on Eliquis, Imdur, Demadex (rate controlled no other meds at home)   8/12: EKG with normal ventricular rate, however in atrial flutter, new diagnosis.  Not on rate controlling medications, cardiology consulted for input.. Getting IVF as below. Patient otherwise stable.   8/14- not new per chart- had pAfib- although pt said not aware (poor STM)- con't since rate appears controlled OFF meds- actually running 50s to 90s  8/15- HR in 50s-60s- so not appropriate for rate meds.  16: BPH: continue Flomax   17:Psoriasis/psoriatic arthritis: his family brought his Desonide 0.05% cream and clobetasol 0.05%  cream to apply to hands, elbows, knees daily as needed             -If can get Cosentyx from home, can give to him- 2 weeks late, will make him hurt too much  8/11- educated pt don't have Cosentyx in hospital- will need ot bring form home 18: Rosacea: his family brought in his Soolantra (ivermectin) 1% cream to apply to face daily as needed   8/11- ordered by pharmacy today  8/15- meds brought in and sent to pharmacy 19: s/p CABG 2023   20: Osteoarthritis: S/p left shoulder arthroplasty; needs the right shoulder done as well- has R frozen shoulder as a result.    21. 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    8/11- BUN up to 60- very dry- and Cr up some- will give IVFs 60cc/hour after therapy til tomorrow and recheck labs in AM  8/12: Creatinine still increasing 1.99, along with increased potassium and downtrending sodium; increase fluid rate today to 1 L IV fluid at 75/h, urine studies pending, repeat BMP at 2 PM to ensure no further worsening  -if creatinine increasing or K increasing, will consult nephrology at that time.  May need bicarbonate.  - Urinalysis normal, urine sodium and serum osmolality appear prerenal, awaiting urine osmolality; K responsive to IVF, continue for 1L, Cr/BUN stable will repeat in AM  8/13- Wil give another 12 hours of NS IVFs 100cc/hour- Cr down to 1.78 and BUN still 84-   8/14- Cr 1.45 down from 1.78 and BUN 67 down from 84- will give 1 more 12 hours of IVFs and recheck in AM   8/16- labs came back late- Cr 1.35 and BUN 48- improving- con't to push PO fluids.  22. - Walked, but has Spina bifida with frequent falls- will d/w therapy.    23. Leukocytosis - resolved now on last day of abx   8/11- afebrile- feel good- will recheck in AM- if doesn't improve, will check U/A and Cx and CXR- having nurse check wounds as well- just to make sure-   -8-12: WBCs continue uptrending, 17.5 today, along with thrombocytosis and anemia.  Ordered urinalysis with reflex to culture, chest x-ray, initiate IV fluids as above.   Urinalysis within normal limits   Chest x-ray pending - RLL infiltrate, significant from last CXR, concerning for aspiration pneumonia. Start IV Zosyn 4.75 mg Q8H for 5 days, SLP consult placed for swallow eval. Labs in AM    8/13- WBC down to 10.7 from 17.5- doing much better- con't Zosyn for pneumonia- asked pt to use ICS q2 hours while awake.   Completed Zosyn afebrile  24. Constipation  8/15- Will give Sorbitol 30cc- if no large BM by then  8/16- Large BM yesterday AM before Sorbitol   25. Anemia  8/15- has dropped, however at the time it was higher, had been hemo-concentrated- and is now doing better- will monitor closely.   8/16- Hb back up to 8.8- doing better 26. Anal fissure  8/16- will order/d/w nursing meds form home for anal fissure- since we have in hospital.    LOS: 8 days A FACE TO FACE EVALUATION WAS PERFORMED  Erick Colace 06/24/2023, 7:46 AM    Addendum:

## 2023-06-24 NOTE — Progress Notes (Signed)
Occupational Therapy Weekly Progress Note  Patient Details  Name: Dylan Bruce MRN: [id number] Date of Birth: 28-Feb-1946  Beginning of progress report period: June 17, 2023 End of progress report period: June 24, 2023  Today's Date: 06/24/2023 OT Individual Time: 1000 - 1106, 66 min     Patient has met 3 of 3 short term goals. Pt demonstrated increased levels of ADLs such as bathing, LB dressing and UB dressing. Pt demonstrating increased RW safety/management and increased independence.   Patient continues to demonstrate the following deficits: muscle weakness, decreased cardiorespiratoy endurance, and decreased sitting balance, decreased standing balance, and decreased balance strategies and therefore will continue to benefit from skilled OT intervention to enhance overall performance with BADL and Reduce care partner burden.  Patient progressing toward long term goals..  Continue plan of care.  OT Short Term Goals Week 1:  OT Short Term Goal 1 (Week 1): Pt will sit EOB while completing ADLs with Max A OT Short Term Goal 1 - Progress (Week 1): Met OT Short Term Goal 2 (Week 1): Pt will complete LB dressing with Max A with AE as necessary OT Short Term Goal 2 - Progress (Week 1): Met OT Short Term Goal 3 (Week 1): Pt will complete UB dressing with Mod A OT Short Term Goal 3 - Progress (Week 1): Met Week 2:  OT Short Term Goal 1 (Week 2): Pt will complete LB dressing with Min A with AE as necessary OT Short Term Goal 2 (Week 2): Pt will complete grooming in standing with Min A OT Short Term Goal 3 (Week 2): Pt will complete footwear Max A  Skilled Therapeutic Interventions/Progress Updates:      Therapy Documentation Precautions:  Precautions Precautions: Fall Restrictions Weight Bearing Restrictions: No LLE Weight Bearing: Weight bearing as tolerated General: "This has been a great day." Pt supine in bed upon OT arrival, agreeable to OT session.  Pain:  unrated pain  reported in Lt hip, shower, activity, intermittent rest breaks, distractions provided for pain management, pt reports tolerable to proceed.   ADL:  Bed mobility: Mod A fot trunk management from supine>EOB UB dressing: Min A for doffing/donning overhead shirt, assistance with down back LB dressing: Mod A for threading onto feet, would benefit from use of reacher, able to don/doff over hips Footwear: total A for socks Shower transfer: Min A ambulating from bed><TTB with no LOB/SOB Bathing: seated on TTB, Min A for assistance for lower legs, lateral leaning for peri hygiene, increased time required   Pt seated in W/C at end of session with W/C alarm donned, call light within reach and 4Ps assessed. Pt positioned for pain management.     Therapy/Group: Individual Therapy  Velia Meyer, OTD, OTR/L 06/24/2023, 6:13 PM

## 2023-06-25 ENCOUNTER — Inpatient Hospital Stay (HOSPITAL_COMMUNITY): Payer: Commercial Managed Care - PPO

## 2023-06-25 LAB — CBC WITH DIFFERENTIAL/PLATELET
Abs Immature Granulocytes: 0.1 10*3/uL — ABNORMAL HIGH (ref 0.00–0.07)
Basophils Absolute: 0.1 10*3/uL (ref 0.0–0.1)
Basophils Relative: 1 %
Eosinophils Absolute: 0.8 10*3/uL — ABNORMAL HIGH (ref 0.0–0.5)
Eosinophils Relative: 8 %
HCT: 29.9 % — ABNORMAL LOW (ref 39.0–52.0)
Hemoglobin: 9.1 g/dL — ABNORMAL LOW (ref 13.0–17.0)
Immature Granulocytes: 1 %
Lymphocytes Relative: 11 %
Lymphs Abs: 1.1 10*3/uL (ref 0.7–4.0)
MCH: 24.9 pg — ABNORMAL LOW (ref 26.0–34.0)
MCHC: 30.4 g/dL (ref 30.0–36.0)
MCV: 81.9 fL (ref 80.0–100.0)
Monocytes Absolute: 1 10*3/uL (ref 0.1–1.0)
Monocytes Relative: 10 %
Neutro Abs: 7.2 10*3/uL (ref 1.7–7.7)
Neutrophils Relative %: 69 %
Platelets: 621 10*3/uL — ABNORMAL HIGH (ref 150–400)
RBC: 3.65 MIL/uL — ABNORMAL LOW (ref 4.22–5.81)
RDW: 19.4 % — ABNORMAL HIGH (ref 11.5–15.5)
WBC: 10.3 10*3/uL (ref 4.0–10.5)
nRBC: 0 % (ref 0.0–0.2)

## 2023-06-25 LAB — GLUCOSE, CAPILLARY
Glucose-Capillary: 189 mg/dL — ABNORMAL HIGH (ref 70–99)
Glucose-Capillary: 213 mg/dL — ABNORMAL HIGH (ref 70–99)
Glucose-Capillary: 110 mg/dL — ABNORMAL HIGH (ref 70–99)
Glucose-Capillary: 233 mg/dL — ABNORMAL HIGH (ref 70–99)
Glucose-Capillary: 229 mg/dL — ABNORMAL HIGH (ref 70–99)

## 2023-06-25 LAB — BASIC METABOLIC PANEL
Anion gap: 8 (ref 5–15)
BUN: 34 mg/dL — ABNORMAL HIGH (ref 8–23)
CO2: 24 mmol/L (ref 22–32)
Calcium: 9.1 mg/dL (ref 8.9–10.3)
Chloride: 105 mmol/L (ref 98–111)
Creatinine, Ser: 1.24 mg/dL (ref 0.61–1.24)
GFR, Estimated: >60 mL/min (ref >60)
Glucose, Bld: 154 mg/dL — ABNORMAL HIGH (ref 70–99)
Potassium: 5.2 mmol/L — ABNORMAL HIGH (ref 3.5–5.1)
Sodium: 137 mmol/L (ref 135–145)

## 2023-06-25 MED ORDER — SENNOSIDES-DOCUSATE SODIUM 8.6-50 MG PO TABS
2.0000 | ORAL_TABLET | Freq: Two times a day (BID) | ORAL | Status: DC
  Administered 2023-06-25 – 2023-07-10 (×30): 2 via ORAL
  Filled 2023-06-25 (×33): qty 2

## 2023-06-25 MED ORDER — SODIUM ZIRCONIUM CYCLOSILICATE 10 G PO PACK
10.0000 g | PACK | Freq: Once | ORAL | Status: AC
  Administered 2023-06-25: 10 g via ORAL
  Filled 2023-06-25: qty 1

## 2023-06-25 NOTE — Progress Notes (Signed)
PROGRESS NOTE   Subjective/Complaints:  Discussed progress as well as Cosentyx and Osteoporosis- and CBG's  LBM 2 days ago- is slower than normal for him- will increase Senna-S-  K+ 5.2- will need Lokelma.   ROS:   Pt denies SOB, abd pain, CP, N/V/C/D, and vision changes   Except for HPI  Objective:   No results found. Recent Labs    06/25/23 0521  WBC 10.3  HGB 9.1*  HCT 29.9*  PLT 621*   Recent Labs    06/25/23 0521  NA 137  K 5.2*  CL 105  CO2 24  GLUCOSE 154*  BUN 34*  CREATININE 1.24  CALCIUM 9.1    Intake/Output Summary (Last 24 hours) at 06/25/2023 0856 Last data filed at 06/25/2023 0505 Gross per 24 hour  Intake 720 ml  Output 2850 ml  Net -2130 ml     Pressure Injury 06/16/23 Sacrum Medial Stage 2 -  Partial thickness loss of dermis presenting as a shallow open injury with a red, pink wound bed without slough. small open area on coccyx in middle of gluteal cleft 1x1 cm (Active)  06/16/23 1848  Location: Sacrum  Location Orientation: Medial  Staging: Stage 2 -  Partial thickness loss of dermis presenting as a shallow open injury with a red, pink wound bed without slough.  Wound Description (Comments): small open area on coccyx in middle of gluteal cleft 1x1 cm  Present on Admission: Yes    Physical Exam: Vital Signs Blood pressure (!) 148/79, pulse 66, temperature 97.8 F (36.6 C), resp. rate 18, height 6\' 2"  (1.88 m), weight 96.9 kg, SpO2 100%.    General: awake, alert, appropriate, sitting up in bed; 100% tray eaten; NAD HENT: conjugate gaze; oropharynx moist CV: regular rate- in Afib; no JVD Pulmonary: CTA B/L; no W/R/R- good air movement GI: soft, NT, ND, (+)BS Psychiatric: appropriate Neurological: Ox3- much more with it- asking complex questions today   Cervical back: Tenderness present.     Comments: Psoriatic arthritis changes esp in hands    R shoulder has ~ 20-25  degrees of active ROM max RUE- deltoid 2-/5; otherwise 4+/5 in RUE LUE shoulder has >90 degrees and 4+/5 in LUE RLE- HF proximally 3-/5 and sitally 4/5 LLE- HF 2-/5; KE 2/5; and DF/PF 4/5-unchanged 8/18    Assessment/Plan: 1. Functional deficits which require 3+ hours per day of interdisciplinary therapy in a comprehensive inpatient rehab setting. Physiatrist is providing close team supervision and 24 hour management of active medical problems listed below. Physiatrist and rehab team continue to assess barriers to discharge/monitor patient progress toward functional and medical goals  Care Tool:  Bathing    Body parts bathed by patient: Right arm, Left arm, Left upper leg, Chest, Front perineal area, Abdomen, Face, Buttocks, Right upper leg   Body parts bathed by helper: Right lower leg, Left lower leg     Bathing assist Assist Level: Minimal Assistance - Patient > 75%     Upper Body Dressing/Undressing Upper body dressing   What is the patient wearing?: Pull over shirt    Upper body assist Assist Level: Minimal Assistance - Patient > 75%    Lower  Body Dressing/Undressing Lower body dressing            Lower body assist Assist for lower body dressing: Moderate Assistance - Patient 50 - 74%     Toileting Toileting    Toileting assist Assist for toileting: Moderate Assistance - Patient 50 - 74%     Transfers Chair/bed transfer  Transfers assist     Chair/bed transfer assist level: Minimal Assistance - Patient > 75%     Locomotion Ambulation   Ambulation assist   Ambulation activity did not occur: Safety/medical concerns    Assistive device: Walker-rolling     Walk 10 feet activity   Assist  Walk 10 feet activity did not occur: Safety/medical concerns        Walk 50 feet activity   Assist Walk 50 feet with 2 turns activity did not occur: Safety/medical concerns         Walk 150 feet activity   Assist Walk 150 feet activity did not  occur: Safety/medical concerns         Walk 10 feet on uneven surface  activity   Assist Walk 10 feet on uneven surfaces activity did not occur: Safety/medical concerns         Wheelchair     Assist Is the patient using a wheelchair?: Yes Type of Wheelchair: Manual    Wheelchair assist level: Dependent - Patient 0%      Wheelchair 50 feet with 2 turns activity    Assist        Assist Level: Dependent - Patient 0%   Wheelchair 150 feet activity     Assist      Assist Level: Dependent - Patient 0%   Blood pressure (!) 148/79, pulse 66, temperature 97.8 F (36.6 C), resp. rate 18, height 6\' 2"  (1.88 m), weight 96.9 kg, SpO2 100%.  Medical Problem List and Plan: 1. Functional deficits secondary to  L intertrochanteric hip fx s/p IM nail WBAT             -patient may  shower             -ELOS/Goals: 10-14 days supervision             D/c 9/6  Con't CIR PT and OT Discussed progress and feeling stronger, esp with shower Saturday. Appears less confused 2.  Antithrombotics: -DVT/anticoagulation:  Pharmaceutical: Eliquis             -antiplatelet therapy: Plavix , change protonix to pepcid as this will help efficacy    3. 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   No complaints of pain today, continue current regimen  8/13- said pain not controlled from hour 3-4- but declined Oxycontin for pain-stopped last dose of Nabumetone- since Cr up so much  8/14- won't change pain meds since more confused per  staff-  pneumonia vs chronic? Pt asking again for q3 hours prn meds- explained will not do this.   8/15- pt agreed to keep pain meds aq4 hours- educated has been a little confused- pt agreed  8/19- pt doing better, but wants to add Cosentyx- as below-  4. Mood/Behavior/Sleep: LCSW to evaluate and provide emotional support             -antipsychotic agents: Vilazodone 40 mg daily             -continue Remeron 30 mg q HS             -continue Provigil 100 mg daily   8/16- will increase Vistaril to BID prn   5. Neuropsych/cognition: This patient is capable of making decisions on his own behalf.   6. Skin/Wound Care: Routine skin care checks             -sacral pressure injury; continue local care/pressure relief             -monitor surgical incision             L elbow and R lower leg wounds- local wound care  8-12: Dressing removed and wounds observed as above; no obvious source of infection, but if no other findings would be most likely soft tissue infection from left elbow or hip..  See below.  7. 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  8/13- CBGs running 199 to 265 in last 24 hours- since Semglee reduced. Don't feel comfortable adding PO meds since Cr up to 1.78 today  CBG (last 3)  Recent Labs    06/24/23 1625 06/24/23 2207 06/25/23 0614  GLUCAP 249* 156* 189*  Reduce semglee , cannot use metformin, also allergies preclude glimipride , consider jardiance, took metformin at home may restart if creat 1.2 or better   8/19- CBG's 122 to 249- needs something, but Jardiance cannot use due to hyperkalemia and Metformin not appropriate due to Variable/labile Cr/BUN- Semglee was reduced yesterday- will give 24 hours to follow trend and treat hyperkalemia.  11: Venous stasis: RLE pre-tibial ulcer; continue local wound care   12: S/p left IM nail Dr. [name]             -  weight bearing at tolerated   13: Sleep apnea: CPAP at 17 nightly   14: Left rib fractures: pain control and pulmonary toilet   15: Paroxysmal atrial fib: on Eliquis, Imdur, Demadex (rate controlled no other meds at home)   8/12: EKG with normal ventricular rate, however in atrial flutter, new diagnosis.  Not on rate controlling medications, cardiology consulted for input.. Getting IVF as below. Patient otherwise stable.   8/14- not new per chart- had pAfib- although pt said not aware (poor STM)- con't since rate appears controlled OFF meds- actually running 50s to 90s  8/15- HR in 50s-60s- so not appropriate for rate meds.   8/19- HR running in the  60s 16: BPH: continue Flomax   17:Psoriasis/psoriatic arthritis: his family brought his Desonide 0.05% cream and clobetasol 0.05% cream to apply to hands, elbows, knees daily as needed             -If can get Cosentyx from home, can give to him- 2 weeks late, will make him hurt too much  8/11- educated pt don't have Cosentyx in hospital- will need ot bring form home  8/19- asked pt to have family bring in  Cosentyx- think this will help his pain and risks of infection outweighed by benefits of pain reduction 18: Rosacea: his family brought in his Soolantra (ivermectin) 1% cream to apply to face daily as needed   8/11- ordered by pharmacy today  8/15- meds brought in and sent to pharmacy 19: s/p CABG 2023   20: Osteoarthritis: S/p left shoulder arthroplasty; needs the right shoulder done as well- has R frozen shoulder as a result.    21. 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    8/11- BUN up to 60- very dry- and Cr up some- will give IVFs 60cc/hour after therapy til tomorrow and recheck labs in AM  8/12: Creatinine still increasing 1.99, along with increased potassium and downtrending sodium; increase fluid rate today to 1 L IV fluid at 75/h, urine studies pending, repeat BMP at 2 PM to ensure no further worsening -if creatinine increasing or K increasing, will consult nephrology at that time.  May need bicarbonate.  - Urinalysis normal, urine sodium and serum osmolality appear prerenal, awaiting urine osmolality; K responsive to IVF, continue for 1L, Cr/BUN stable will repeat in AM  8/13- Wil give another 12 hours of NS IVFs 100cc/hour- Cr down to 1.78 and BUN still 84-   8/14- Cr 1.45 down from 1.78 and BUN 67 down from 84- will give 1 more 12 hours of IVFs and recheck in AM   8/16- labs came back late- Cr 1.35 and BUN 48- improving- con't to push PO fluids.   8/19- BUN down to 34 and Cr 1.24- much improved- con't to monitor weekly. Will give Lokelma for K+ 5.2- will recheck in AM 22. - Walked, but has Spina bifida with frequent falls- will d/w therapy.    23. Leukocytosis - resolved now on last day of abx   8/11- afebrile- feel good- will recheck in AM- if doesn't improve, will check U/A and Cx and CXR- having nurse check wounds as well- just to make sure-   -8-12: WBCs continue  uptrending, 17.5 today, along with thrombocytosis and anemia.  Ordered urinalysis with reflex to culture, chest x-ray, initiate IV fluids as above.   Urinalysis within normal limits   Chest x-ray pending - RLL  infiltrate, significant from last CXR, concerning for aspiration pneumonia. Start IV Zosyn 4.75 mg Q8H for 5 days, SLP consult placed for swallow eval. Labs in AM    8/13- WBC down to 10.7 from 17.5- doing much better- con't Zosyn for pneumonia- asked pt to use ICS q2 hours while awake.   Completed Zosyn afebrile  24. Constipation  8/15- Will give Sorbitol 30cc- if no large BM by then  8/16- Large BM yesterday AM before Sorbitol 8/19- LBM 2 days ago- is hard/feels constipated- will change Colace and Senna to Senna-S and increase to 2 tabs BID   25. Anemia  8/15- has dropped, however at the time it was higher, had been hemo-concentrated- and is now doing better- will monitor closely.   8/16- Hb back up to 8.8- doing better 26. Anal fissure  8/16- will order/d/w nursing meds from home for anal fissure- since we have in hospital.  27. Osteoporosis-   8/19- pt reports going ot start Fosamax, even before had fx- so needs it! D/w pt today and need for getting it started as soon as leaves.     I spent a total of  53  minutes on total care today- >50% coordination of care- due to  [facility] pharmacy, d/w pt about CBG's and at length about Cosentx and osteoporosis.  LOS: 9 days A FACE TO FACE EVALUATION WAS PERFORMED  Joi Leyva 06/25/2023, 8:56 AM    Addendum:

## 2023-06-25 NOTE — Progress Notes (Signed)
Physical Therapy Weekly Progress Note  Patient Details  Name: Dylan Bruce MRN: [id number] Date of Birth: 1946/10/21  Beginning of progress report period: June 17, 2023 End of progress report period: June 25, 2023  Today's Date: 06/25/2023 PT Individual Time: 6578-4696, [id number]  PT Individual Time Calculation (min): 68 min , 20 min   Patient has met 2 of 3 short term goals.  Patient is making limited progress to long term goals secondary to pain. Pt largely requires mod A with bed mobility and sit<>stand transfers. Pt min A with stand pivot transfers and gait x 20 ft with RW with max cues to increase left weight shift in stance.   Patient continues to demonstrate the following deficits muscle weakness, decreased cardiorespiratoy endurance, decreased coordination, and decreased sitting balance, decreased standing balance, decreased postural control, and decreased balance strategies and therefore will continue to benefit from skilled PT intervention to increase functional independence with mobility.  Patient progressing toward long term goals..  Continue plan of care.  PT Short Term Goals Week 1:  PT Short Term Goal 1 (Week 1): Pt will be able to perform bed mobility with mod assist PT Short Term Goal 1 - Progress (Week 1): Met PT Short Term Goal 2 (Week 1): Pt will be able to perform sit <> stands with mod assist PT Short Term Goal 2 - Progress (Week 1): Met PT Short Term Goal 3 (Week 1): Pt will be able to perform bed <> w/c transfers with mod assist PT Short Term Goal 3 - Progress (Week 1): Met PT Short Term Goal 4 (Week 1): Pt will be able to gait x 25' with min assist PT Short Term Goal 4 - Progress (Week 1): Not met Week 2:  PT Short Term Goal 1 (Week 2): Pt will require min A with bed mobility PT Short Term Goal 2 (Week 2): Pt will require min A with sit to stand transfers with LRAD PT Short Term Goal 3 (Week 2): Pt will require min A with gait x 10 ft with LRAD  Skilled  Therapeutic Interventions/Progress Updates:      Therapy Documentation Precautions:  Precautions Precautions: Fall Restrictions Weight Bearing Restrictions: No LLE Weight Bearing: Weight bearing as tolerated   Treatment Session 1:   Pt received seated in recliner at bedside and with unrated right shoulder pain and left LE pain, pre-medicated. PT also provided rest/repositioning for relief.   Pt requests to utilize urinal in standing position and requires max A with sit<>stand from recliner and (S) for static standing balance while voiding. Pt transitioned to gait training and ambulated 20 ft with RW min A with max cues to increase left weight shift and step length.   Pt dependently transported remaining distance to main gym and engaged in therapeutic exercises to increase left lateral weight shift in standing position. Pt performed 1 x 6 and 1 x 3 of 3 inch step taps with R LE. Pt requested to rest due to fatigue and expressed concerns about current discharge plan. Pt is hopeful that he will progress but realizes he lacks 24/7 support at home with his wife who is also recovering from injury.   Pt returned to room and left seated in TIS per patient request for lunch. Pt family present and pt with observed improvement in mood.   Pt left with all needs in reach and alarm on.   Treatment Session 2:   Pt received seated in w/c at bedside requesting assistance to return to  bed as he can not tolerate another PT session. Pt mod A with stand pivot from TIS to bed and with sit to lying. Pt reports pain is limiting and was pre-medicated prior to session. Pt left semi-reclined in bed with alarm on and all needs within reach.    Therapy/Group: Individual Therapy  Truitt Leep Truitt Leep PT, DPT  06/25/2023, 7:47 AM

## 2023-06-25 NOTE — Progress Notes (Signed)
Occupational Therapy Session Note  Patient Details  Name: Dylan Bruce MRN: [id number] Date of Birth: December 14, 1945  Today's Date: 06/25/2023 OT Individual Time: 0900-1000 & 1330-1430 OT Individual Time Calculation (min): 60 min & 60 min   Short Term Goals: Week 2:  OT Short Term Goal 1 (Week 2): Pt will complete LB dressing with Min A with AE as necessary OT Short Term Goal 2 (Week 2): Pt will complete grooming in standing with Min A OT Short Term Goal 3 (Week 2): Pt will complete footwear Max A  Skilled Therapeutic Interventions/Progress Updates:      Therapy Documentation Precautions:  Precautions Precautions: Fall Restrictions Weight Bearing Restrictions: No LLE Weight Bearing: Weight bearing as tolerated  Session 1 General: "You know what [place] Dec I feel okay" Pt supine in bed upon OT arrival, agreeable to OT session.  Pain:  unrated pain reported in Lt hip, activity, intermittent rest breaks, distractions provided for pain management, pt reports tolerable to proceed.   ADL: Bed mobility: Min A with leg lifter supine>EOB, increased time to complete  UB dressing: SBA seated EOB for doffing/donning overhead shirt LB dressing: Min A with use of reacher to thread over feet seated EOB, assistance with VC for placement for efficiency, able to stand at RW to manage pants Min A for stabilization, increased time to complete d/t processing and VC for direction to task Footwear: Min A with sock aide, assistance with original demonstration and technique Transfers: Min A emerging CGA with RW for stand pivot transfer from bed>recliner  Other Treatments: Pt seated in recliner and OT provided positioning with pillows in order for pain management and proper positioning for relaxation and pain management.   Pt seated in recliner at end of session with W/C alarm donned, call light within reach and 4Ps assessed.   Session 2 General: "That's the furthest I've walked yet!" Pt supine in bed upon  OT arrival, agreeable to OT session.  Pain:  9/10 pain reported in Lt hip, activity, intermittent rest breaks, distractions provided for pain management, pt reports tolerable to proceed. Pt medication due at 3 per nurse report.   ADL: Oral hygiene: SBA seated at sink in W/C to brush teeth and dentures, increased time required Transfers: Pt ambulated 37 ft with RW CGA with no SOB/LOB with close W/C follow d/t increased fatigue and increased time to complete. Increased rest break needed after ambulation  Other Treatments: Pt issued ice at end of session for pain management. Pt positioned in TIS W/C with LLE  positioned for comfort and pain management.  Pt seated in W/C at end of session with W/C alarm donned, call light within reach and 4Ps assessed.   Therapy/Group: Individual Therapy  Velia Meyer, OTD, OTR/L 06/25/2023, 4:13 PM

## 2023-06-26 LAB — BASIC METABOLIC PANEL
Anion gap: 11 (ref 5–15)
BUN: 44 mg/dL — ABNORMAL HIGH (ref 8–23)
CO2: 23 mmol/L (ref 22–32)
Calcium: 9.2 mg/dL (ref 8.9–10.3)
Chloride: 103 mmol/L (ref 98–111)
Creatinine, Ser: 1.47 mg/dL — ABNORMAL HIGH (ref 0.61–1.24)
GFR, Estimated: 49 mL/min — ABNORMAL LOW (ref >60)
Glucose, Bld: 216 mg/dL — ABNORMAL HIGH (ref 70–99)
Potassium: 5.3 mmol/L — ABNORMAL HIGH (ref 3.5–5.1)
Sodium: 137 mmol/L (ref 135–145)

## 2023-06-26 LAB — GLUCOSE, CAPILLARY
Glucose-Capillary: 204 mg/dL — ABNORMAL HIGH (ref 70–99)
Glucose-Capillary: 254 mg/dL — ABNORMAL HIGH (ref 70–99)
Glucose-Capillary: 112 mg/dL — ABNORMAL HIGH (ref 70–99)
Glucose-Capillary: 173 mg/dL — ABNORMAL HIGH (ref 70–99)

## 2023-06-26 MED ORDER — SODIUM ZIRCONIUM CYCLOSILICATE 10 G PO PACK
10.0000 g | PACK | Freq: Two times a day (BID) | ORAL | Status: AC
  Administered 2023-06-26 (×2): 10 g via ORAL
  Filled 2023-06-26 (×3): qty 1

## 2023-06-26 MED ORDER — LINAGLIPTIN 5 MG PO TABS
5.0000 mg | ORAL_TABLET | Freq: Every day | ORAL | Status: DC
  Administered 2023-06-26 – 2023-07-04 (×9): 5 mg via ORAL
  Filled 2023-06-26 (×9): qty 1

## 2023-06-26 MED ORDER — SODIUM CHLORIDE 0.9 % IV SOLN: INTRAVENOUS | Status: DC

## 2023-06-26 NOTE — Progress Notes (Signed)
   06/25/23 2026  BiPAP/CPAP/SIPAP  BiPAP/CPAP/SIPAP Pt Type Adult  BiPAP/CPAP/SIPAP Resmed  Mask Type Nasal mask  Patient Home Equipment Yes  Safety Check Completed by RT for Home Unit Yes, no issues noted  BiPAP/CPAP /SiPAP Vitals  Pulse Rate 65  Resp 18  SpO2 97 %  Bilateral Breath Sounds Clear;Diminished  MEWS Score/Color  MEWS Score 0  MEWS Score Color Dylan Bruce

## 2023-06-26 NOTE — Progress Notes (Signed)
Physical Therapy Note  Patient Details  Name: Dylan Bruce MRN: [id number] Date of Birth: 1946/01/15 Today's Date: 06/26/2023   Patient's status changed to 15/7 due to fatigue and pain and decreased ability to participate in PT/OT sessions.    Truitt Leep 06/26/2023, 11:52 AM

## 2023-06-26 NOTE — Progress Notes (Signed)
Patient ID: Dylan Bruce, male   DOB: 03/24/1946, 77 y.o.   MRN: [id number]  Met with pt to give him the team conference update regarding progression toward goals and doing better this week. He reports he feels better and can see his progress this week. His wife and daughter are coming tomorrow and will update them then. Pt has a target discharge date of 9/6. Discussed home health and he is on board with this due to does not have a driver for OP since wife can not drive still with wrist fracture. Will see wife and daughter tomorrow when here.

## 2023-06-26 NOTE — Progress Notes (Signed)
Order to remove staples on left hip but patient has skin glue on incision site. No staples noted.  Patient requesting to remove sutures on his left arm due to removal of skin cancer about 3 weeks ago. We will let MD aware.

## 2023-06-26 NOTE — Progress Notes (Signed)
   06/26/23 2010  BiPAP/CPAP/SIPAP  $ Non-Invasive Home Ventilator  Subsequent  BiPAP/CPAP/SIPAP Pt Type Adult  BiPAP/CPAP/SIPAP Resmed  Mask Type Nasal mask  FiO2 (%) 21 %  Patient Home Equipment Yes  Safety Check Completed by RT for Home Unit Yes, no issues noted  BiPAP/CPAP /SiPAP Vitals  Pulse Rate 62  Resp 12  SpO2 98 %  Bilateral Breath Sounds Clear;Diminished  MEWS Score/Color  MEWS Score 1  MEWS Score Color Green

## 2023-06-26 NOTE — Patient Care Conference (Signed)
Inpatient RehabilitationTeam Conference and Plan of Care Update Date: 06/26/2023   Time: 11:45 AM    Patient Name: Dylan Bruce      Medical Record Number: [id number]  Date of Birth: 05/19/1946 Sex: Male         Room/Bed: 4M01C/4M01C-01 Payor Info: Payor: Advertising copywriter / Plan: [employer] PPO / Product Type: *No Product type* /    Admit Date/Time:  06/16/2023  5:42 PM  Primary Diagnosis:  Closed left hip fracture, initial encounter [facility])  Hospital Problems: Principal Problem:   Closed left hip fracture, initial encounter [facility]) Active Problems:   Spina bifida [facility])   S/p left hip fracture    Expected Discharge Date: Expected Discharge Date: 07/13/23  Team Members Present: Physician leading conference: Dr. [name] Social Worker Present: Dossie Der, LCSW Nurse Present: Vedia Pereyra, RN PT Present: Truitt Leep, PT OT Present: Velia Meyer, OT PPS Coordinator present : Edson Snowball, PT     Current Status/Progress Goal Weekly Team Focus  Bowel/Bladder   Pt is cont/incontinent of B/B LBM: 08/19   become more continent of b/b   timed toileting qshift and PRN    Swallow/Nutrition/ Hydration               ADL's   Bed mobility: Min A with leg lifter supine>EOB, increased time to complete   UB dressing: SBA seated EOB for doffing/donning overhead shirt  LB dressing: Min A with use of reacher to thread over feet seated EOB, assistance with VC for placement for efficiency, able to stand at RW to manage pants Min A for stabilization, increased time to complete d/t processing and VC for direction to task  Footwear: Min A with sock aide, assistance with original demonstration and technique  Transfers: Min A emerging CGA with RW for stand pivot transfer from bed>recliner   supervision overall   increased standing tolerance for ADL retraining, dynamic sitting and standing    Mobility   CGA/min bed mobility, min A transfers, CGA/min gait x 37 ft with RW   (S)  ambulatory goals  activity tolerance and repositioning for pain management    Communication                Safety/Cognition/ Behavioral Observations               Pain   pt states pain in L hip   pt denies pain   pain assessment qshift and PRN    Skin   pt has stage 2 coccys (healing) R tibial ulcer, L elbow abrasions, anal fissure   prevent further skin breakdown and promote healing  skin assessment qshift and PRN      Discharge Planning:  Pt making progress in therapies, has been here to observe. Pt's goals are supervision wife aware of need for this. Work on discharge needs.   Team Discussion: Left hip fracture. Hx of anal fissure. Performs his own pericare. Bun/Cr/Potassium elevated. May have nephrology see if doesn't improve. Patient requesting pain medication to be increased but didn't want long acting medication for better control. Family to bring in Cosentyx. Wears CPAp at HS. Will have WTA review skin as all areas are noted as granulating. Daily weight. Tolerating carb-mod diet. Therapies are limited by fatigue with inability to tolerate other therapies the rest of the day and very slow at performing task.  Patient on target to meet rehab goals: Will continue to progress towards goals with discharge date of 07/13/23  *See Care Plan and progress  notes for long and short-term goals.   Revisions to Treatment Plan:  X-ray of hip/pelvis.  Remove staples from hip. Lokelma given. Tradjenta added. Change therapy to 15/7. Monitor labs/VS Teaching Needs: Medications, diet modifications, safety, self care, skin/wound care, gait/transfer training, etc.   Current Barriers to Discharge: Wound care and Lack of/limited family support  Possible Resolutions to Barriers: Family education Independent with wound care Order recommended DME if needed.      Medical Summary Current Status: large black stool- on iron- but will check FOBT; also CBC to be checked in AM; L hip staples-  R heel ulcer-  Barriers to Discharge: Weight bearing restrictions;Uncontrolled Diabetes;Complicated Wound;Behavior/Mood;Uncontrolled Pain;Self-care education;Renal Insufficiency/Failure;Symptomatic Anemia  Barriers to Discharge Comments: limited by: pain; psoriatic arthritis- refused oxycontin wants q3 hours- also has wounds- CBgs variable Possible Resolutions to [employer] Focus: checking CBC/making sure no GI bleed; Cr increased- IVF's- started Tradjenta for DM; not increasing pain meds, but can add COsentyx for psiatic arthritis. - d/c 9/6   Continued Need for Acute Rehabilitation Level of Care: The patient requires daily medical management by a physician with specialized training in physical medicine and rehabilitation for the following reasons: Direction of a multidisciplinary physical rehabilitation program to maximize functional independence : Yes Medical management of patient stability for increased activity during participation in an intensive rehabilitation regime.: Yes Analysis of laboratory values and/or radiology reports with any subsequent need for medication adjustment and/or medical intervention. : Yes   I attest that I was present, lead the team conference, and concur with the assessment and plan of the team.   Jearld Adjutant 06/26/2023, 4:25 PM

## 2023-06-26 NOTE — Progress Notes (Signed)
Occupational Therapy Session Note  Patient Details  Name: Dylan Bruce MRN: [id number] Date of Birth: 07/10/1946  Today's Date: 06/26/2023 OT Individual Time: 8295-6213 & 1330-1445 OT Individual Time Calculation (min): 71 min & 75 min   Short Term Goals: Week 1:  OT Short Term Goal 1 (Week 1): Pt will sit EOB while completing ADLs with Max A OT Short Term Goal 1 - Progress (Week 1): Met OT Short Term Goal 2 (Week 1): Pt will complete LB dressing with Max A with AE as necessary OT Short Term Goal 2 - Progress (Week 1): Met OT Short Term Goal 3 (Week 1): Pt will complete UB dressing with Mod A OT Short Term Goal 3 - Progress (Week 1): Met Week 2:  OT Short Term Goal 1 (Week 2): Pt will complete LB dressing with Min A with AE as necessary OT Short Term Goal 2 (Week 2): Pt will complete grooming in standing with Min A OT Short Term Goal 3 (Week 2): Pt will complete footwear Max A  Skilled Therapeutic Interventions/Progress Updates:      Therapy Documentation Precautions:  Precautions Precautions: Fall Restrictions Weight Bearing Restrictions: No LLE Weight Bearing: Weight bearing as tolerated Session 1 General: "I found a way to sit on the recliner." Pt seated in recliner upon OT arrival, agreeable to OT.  Pain:  5/10 pain reported in Lt hip, activity, intermittent rest breaks, distractions provided for pain management, pt reports tolerable to proceed.   ADL:  Transfers: stand pivot transfer without AE Mod A, pt ambulated ~30 ft from hallway>bed   Exercises: Pt completed 10 minutes of arm bike, 5 min forward, 5 min backward in order to increase BUE/BLEstrength and endurance in preparation for increased independence in ADLs such as UE dressing. Rest break after 5 min, on moderate resistance.  Pt issued seated UE kinesthetic/theraband HEP in order to increase functional strength, endurance and activity tolerance in order to increase independence in ADLs such as bathing. Pt  issued yellow theraband and discussed direction/technique of exercises, demonstrating verbal understanding. Pt completed 3x10 exercises at W/C level listed below:  Exercises - Seated Shoulder Shrugs  - 1 x daily - 7 x weekly - 3 sets - 10 reps - Seated Boxing Jabs  - 1 x daily - 7 x weekly - 3 sets - 10 reps - Seated Scapular Protraction and Retraction  - 1 x daily - 7 x weekly - 3 sets - 10 reps - Seated Elbow Extension with Self-Anchored Resistance  - 1 x daily - 7 x weekly - 3 sets - 10 reps - Seated Elbow Flexion with Self-Anchored Resistance  - 1 x daily - 7 x weekly - 3 sets - 10 reps  Pt supine in bed with bed alarm activated, 2 bed rails up, call light within reach and 4Ps assessed.   Session 2 General: "I can't wait for this shower." Pt supine in bed upon OT arrival, agreeable to OT session.  Pain:  unrated pain reported in Lt hip, shower, activity, intermittent rest breaks, distractions provided for pain management, pt reports tolerable to proceed.   ADL: Bed mobility: SBA with leg lifter to manage LLE Grooming: SBA seated EOB to brush hair, seated EOB to apply cream to under stomach fold d/t redness, nursing aware UB dressing: SBA seated EOB for donning/doffing overhead shirt LB dressing: CGA, using reacher to thread pants on/off when sitting EOB, able to manage over waist in standing at RW Footwear: SBA to doff socks with  Systems developer transfer: CGA with RW ambulating from bed><TTB ~10 ft Bathing: CGA seated on TTB, standing for buttocks hygiene, IV line covered, mepilex bandaids replaced and dated, nursing notified Transfers: CGA overall for transfers completed this session with RW  Pt with increased time to complete ADL tasks with rest breaks required d/t fatigue and limited activity tolerance.     Pt seated in recliner at end of session with W/C alarm donned, call light within reach and 4Ps assessed.    Therapy/Group: Individual Therapy  Velia Meyer, OTD,  OTR/L 06/26/2023, 4:34 PM

## 2023-06-26 NOTE — Progress Notes (Addendum)
PROGRESS NOTE   Subjective/Complaints:  Pt to have family bring in Cosyntyx.  LBM yesterday Stronger than was yesterday  We discussed renal issues as well and getting staples out.    ROS:    Pt denies SOB, abd pain, CP, N/V/C/D, and vision changes  Except for HPI  Objective:   DG HIP UNILAT WITH PELVIS 2-3 VIEWS LEFT  Result Date: 06/25/2023 CLINICAL DATA:  Left hip fracture follow-up after pinning. EXAM: DG HIP (WITH OR WITHOUT PELVIS) 2-3V LEFT COMPARISON:  Left hip radiographs 06/10/2023, 06/09/2023; lumbar spine radiographs 02/19/2020 FINDINGS: Redemonstration of left proximal femoral cephalomedullary nail fixation. Unchanged anatomic alignment. Mild-to-moderate bilateral femoroacetabular joint space narrowing. Mild bilateral sacroiliac subchondral sclerosis. L5-S1 posterior element fixation hardware with a screw overlying the left posterior elements, similar to 02/19/2020 lumbar spine radiographs. IMPRESSION: Redemonstration of left proximal femoral cephalomedullary nail fracture fixation. Unchanged anatomic alignment. Electronically Signed   By: Neita Garnet M.D.   On: 06/25/2023 17:09   Recent Labs    06/25/23 0521  WBC 10.3  HGB 9.1*  HCT 29.9*  PLT 621*   Recent Labs    06/25/23 0521 06/26/23 0531  NA 137 137  K 5.2* 5.3*  CL 105 103  CO2 24 23  GLUCOSE 154* 216*  BUN 34* 44*  CREATININE 1.24 1.47*  CALCIUM 9.1 9.2    Intake/Output Summary (Last 24 hours) at 06/26/2023 0835 Last data filed at 06/26/2023 0746 Gross per 24 hour  Intake 477 ml  Output 2200 ml  Net -1723 ml     Pressure Injury 06/16/23 Sacrum Medial Stage 2 -  Partial thickness loss of dermis presenting as a shallow open injury with a red, pink wound bed without slough. small open area on coccyx in middle of gluteal cleft 1x1 cm (Active)  06/16/23 1848  Location: Sacrum  Location Orientation: Medial  Staging: Stage 2 -  Partial  thickness loss of dermis presenting as a shallow open injury with a red, pink wound bed without slough.  Wound Description (Comments): small open area on coccyx in middle of gluteal cleft 1x1 cm  Present on Admission: Yes    Physical Exam: Vital Signs Blood pressure (!) 151/94, pulse 64, temperature 97.9 F (36.6 C), resp. rate 17, height 6\' 2"  (1.88 m), weight 96.9 kg, SpO2 98%.     General: awake, alert, appropriate, NAD HENT: conjugate gaze; oropharynx moist CV: regular rate; no JVD Pulmonary: CTA B/L; no W/R/R- good air movement GI: soft, NT, ND, (+)BS Psychiatric: appropriate Neurological: Ox3    Cervical back: Tenderness present.     Comments: Psoriatic arthritis changes esp in hands    R shoulder has ~ 20-25 degrees of active ROM max RUE- deltoid 2-/5; otherwise 4+/5 in RUE LUE shoulder has >90 degrees and 4+/5 in LUE RLE- HF proximally 3-/5 and sitally 4/5 LLE- HF 2-/5; KE 2/5; and DF/PF 4/5-unchanged 8/18    Assessment/Plan: 1. Functional deficits which require 3+ hours per day of interdisciplinary therapy in a comprehensive inpatient rehab setting. Physiatrist is providing close team supervision and 24 hour management of active medical problems listed below. Physiatrist and rehab team continue to assess barriers to discharge/monitor patient progress  toward functional and medical goals  Care Tool:  Bathing    Body parts bathed by patient: Right arm, Left arm, Left upper leg, Chest, Front perineal area, Abdomen, Face, Buttocks, Right upper leg   Body parts bathed by helper: Right lower leg, Left lower leg     Bathing assist Assist Level: Minimal Assistance - Patient > 75%     Upper Body Dressing/Undressing Upper body dressing   What is the patient wearing?: Pull over shirt    Upper body assist Assist Level: Minimal Assistance - Patient > 75%    Lower Body Dressing/Undressing Lower body dressing            Lower body assist Assist for lower body  dressing: Moderate Assistance - Patient 50 - 74%     Toileting Toileting    Toileting assist Assist for toileting: Moderate Assistance - Patient 50 - 74%     Transfers Chair/bed transfer  Transfers assist     Chair/bed transfer assist level: Minimal Assistance - Patient > 75%     Locomotion Ambulation   Ambulation assist   Ambulation activity did not occur: Safety/medical concerns    Assistive device: Walker-rolling     Walk 10 feet activity   Assist  Walk 10 feet activity did not occur: Safety/medical concerns        Walk 50 feet activity   Assist Walk 50 feet with 2 turns activity did not occur: Safety/medical concerns         Walk 150 feet activity   Assist Walk 150 feet activity did not occur: Safety/medical concerns         Walk 10 feet on uneven surface  activity   Assist Walk 10 feet on uneven surfaces activity did not occur: Safety/medical concerns         Wheelchair     Assist Is the patient using a wheelchair?: Yes Type of Wheelchair: Manual    Wheelchair assist level: Dependent - Patient 0%      Wheelchair 50 feet with 2 turns activity    Assist        Assist Level: Dependent - Patient 0%   Wheelchair 150 feet activity     Assist      Assist Level: Dependent - Patient 0%   Blood pressure (!) 151/94, pulse 64, temperature 97.9 F (36.6 C), resp. rate 17, height 6\' 2"  (1.88 m), weight 96.9 kg, SpO2 98%.  Medical Problem List and Plan: 1. Functional deficits secondary to  L intertrochanteric hip fx s/p IM nail WBAT             -patient may  shower             -ELOS/Goals: 10-14 days supervision             D/c 9/6  Con't CIR PT and OT  Team conference today to f/u on progress  -d/c staples and place steristrips today 2.  Antithrombotics: -DVT/anticoagulation:  Pharmaceutical: Eliquis             -antiplatelet therapy: Plavix , change protonix to pepcid as this will help efficacy    3. 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   No complaints of pain today, continue current regimen  8/13- said pain not controlled from hour 3-4- but declined Oxycontin for pain-stopped last dose of Nabumetone- since Cr up so much  8/14- won't change pain meds since more confused per staff- pneumonia vs chronic? Pt asking again for q3 hours prn meds- explained will not do this.   8/15- pt agreed to keep pain meds aq4 hours- educated has been a little confused- pt agreed  8/19-8/20-  pt doing better, but wants to add Cosentyx- as below-  4. Mood/Behavior/Sleep: LCSW to evaluate and provide emotional support             -antipsychotic agents: Vilazodone 40 mg daily             -continue Remeron 30 mg q HS             -continue Provigil 100 mg daily   8/16- will increase Vistaril to BID prn   5. Neuropsych/cognition: This patient is capable of making decisions on his own behalf.   6. Skin/Wound Care: Routine skin care checks             -sacral pressure injury; continue local care/pressure relief             -monitor surgical incision             L elbow and R lower leg wounds- local wound care  8-12: Dressing removed and wounds observed as above; no obvious source of infection, but if no other findings would be most likely soft tissue infection from left elbow or hip..  See below.  7. Fluids/Electrolytes/Nutrition: Routine Is and Os and follow-up chemistries             -continue Mag-ox 400 mg daily   8: Hypertension: monitor TID and prn -Continue Imdur 30  mg daily -continue Demadex 20 mg daily   Vitals:   06/25/23 2026 06/26/23 0407  BP:  (!) 151/94  Pulse: 65 64  Resp: 18 17  Temp:  97.9 F (36.6 C)  SpO2: 97% 98%   Fair control 8/18  8/19-8/20 BP controlled- con't regimen 9: Hyperlipidemia: continue statin, Zetia   10: DM-insulin dependent: CBGs QID; A1c = 8.5% on 02/20/2023 (Jardiance 10 mg, Metformin 1000 mg BID, Ozempic, Lantus and SSI with meals at home)             -continue SSI             -continue Novolog 12 units with meals             -continue Semglee 25 units BID   8/11- CBG's 150s-300s- will increase Semglee to 28 units BID- might need more titration on Monday;   8/12: Overnight persistent low 50s requiring half an ampule of D50 to increase; reduce Semglee back to 25 units twice daily, appears overall has down trended since admission.  8/13- CBGs running 199 to 265 in last 24 hours- since Semglee reduced. Don't feel comfortable adding PO meds since Cr up to 1.78 today  CBG (last 3)  Recent Labs    06/25/23 2115 06/25/23 2134 06/26/23 0617  GLUCAP 233* 229* 204*  Reduce semglee , cannot use metformin, also allergies preclude glimipride , consider jardiance, took metformin at home may restart if creat 1.2 or better   8/19- CBG's 122 to 249- needs something, but Jardiance cannot use due to hyperkalemia and Metformin not appropriate due to Variable/labile Cr/BUN- Semglee was reduced yesterday- will give 24 hours to follow trend and treat hyperkalemia.   8/20- CBGs 110-233- will try Tradjenta 5 mg daily since not renally dosed 11: Venous stasis: RLE pre-tibial ulcer; continue local wound care   12: S/p left IM nail Dr. [name]             -weight bearing at tolerated   13: Sleep apnea: CPAP at 17 nightly   14: Left rib fractures: pain control and pulmonary toilet   15: Paroxysmal atrial fib: on Eliquis, Imdur, Demadex (rate controlled no other meds at home)   8/12: EKG with normal ventricular rate, however in atrial  flutter, new diagnosis.  Not on rate controlling medications, cardiology consulted for input.. Getting IVF as below. Patient otherwise stable.   8/14- not new per chart- had pAfib- although pt said not aware (poor STM)- con't since rate appears controlled OFF meds- actually running 50s to 90s  8/15- HR in 50s-60s- so not appropriate for rate meds.   8/19- HR running in the  60s 16: BPH: continue Flomax   17:Psoriasis/psoriatic arthritis: his family brought his Desonide 0.05% cream and clobetasol 0.05% cream to apply to hands, elbows, knees daily as needed             -If can get Cosentyx from home, can give to him- 2 weeks late, will make him hurt too much  8/11- educated pt don't have Cosentyx in hospital- will need ot bring form home  8/19- asked pt to have family bring in Cosentyx- think this will help his pain and risks of infection outweighed by benefits of pain reduction  8/20- family to bring in  18: Rosacea: his family brought in his Soolantra (ivermectin) 1% cream to apply to face daily as needed   8/11- ordered by pharmacy today  8/15- meds brought in and sent to pharmacy 19: s/p CABG 2023   20: Osteoarthritis: S/p left shoulder arthroplasty; needs the right shoulder done as well- has R frozen shoulder as a result.    21. 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    8/11- BUN up to 60- very dry- and Cr up some- will give IVFs 60cc/hour after therapy til tomorrow and recheck labs in AM  8/12: Creatinine still increasing 1.99, along with increased potassium and downtrending sodium; increase fluid rate today to 1 L IV fluid at 75/h, urine studies pending, repeat BMP at 2 PM to ensure no further worsening -if creatinine increasing or K increasing, will consult nephrology at that time.  May need bicarbonate.  - Urinalysis normal, urine sodium and serum osmolality appear prerenal,  awaiting urine osmolality; K responsive to IVF, continue for 1L, Cr/BUN stable will repeat in AM  8/13- Wil give another 12 hours of NS IVFs 100cc/hour- Cr down to 1.78 and BUN still 84-   8/14- Cr 1.45 down from 1.78 and BUN 67 down from 84- will give 1 more 12 hours of IVFs and recheck in AM   8/16- labs came back late- Cr 1.35 and BUN 48- improving- con't to push PO fluids.  8/19- BUN down to 34 and Cr 1.24- much improved- con't to monitor weekly. Will give Lokelma for K+ 5.2- will recheck in AM  8/20- Got Lokelma but K+ up further to 5.3- spoke with pharmacy- will give Lokelma 10 G BID x 2 doses and give IVF NS- 75cc/hour x 16 hours to start  at 3pm- and recheck in AM- if K+ isn't down in AM, will call renal.  22. - Walked, but has Spina bifida with frequent falls- will d/w therapy.    23. Leukocytosis - resolved now on last day of abx   8/11- afebrile- feel good- will recheck in AM- if doesn't improve, will check U/A and Cx and CXR- having nurse check wounds as well- just to make sure-   -8-12: WBCs continue uptrending, 17.5 today, along with thrombocytosis and anemia.  Ordered urinalysis with reflex to culture, chest x-ray, initiate IV fluids as above.   Urinalysis within normal limits   Chest x-ray pending - RLL infiltrate, significant from last CXR, concerning for aspiration pneumonia. Start IV Zosyn 4.75 mg Q8H for 5 days, SLP consult placed for swallow eval. Labs in AM    8/13- WBC down to 10.7 from 17.5- doing much better- con't Zosyn for pneumonia- asked pt to use ICS q2 hours while awake.   Completed Zosyn afebrile  24. Constipation  8/15- Will give Sorbitol 30cc- if no large BM by then  8/16- Large BM yesterday AM before Sorbitol 8/19- LBM 2 days ago- is hard/feels constipated- will change Colace and Senna to Senna-S and increase to 2 tabs BID  8/20- LBM yesterday  25. Anemia  8/15- has dropped, however at the time it was higher, had been hemo-concentrated- and is now doing  better- will monitor closely.   8/16- Hb back up to 8.8- doing better 26. Anal fissure  8/16- will order/d/w nursing meds from home for anal fissure- since we have in hospital.  27. Osteoporosis-   8/19- pt reports going ot start Fosamax, even before had fx- so needs it! D/w pt today and need for getting it started as soon as leaves.    Addendum- pt had large black stool- will check Hemoccult as well as CBC in AM But he is on iron TID   I spent a total of 56    minutes on total care today- >50% coordination of care- due to  D/w pharmacy at length about K+, renal issues- review of labs back 1 year- and team conference to f/u on progress LOS: 10 days A FACE TO FACE EVALUATION WAS PERFORMED  Juandiego Kolenovic 06/26/2023, 8:35 AM    Addendum:

## 2023-06-26 NOTE — Progress Notes (Signed)
Physical Therapy Session Note  Patient Details  Name: Dylan Bruce MRN: [id number] Date of Birth: Aug 30, 1946  Today's Date: 06/26/2023 PT Individual Time: 0800-0900 PT Individual Time Calculation (min): 60 min   Short Term Goals: Week 2:  PT Short Term Goal 1 (Week 2): Pt will require min A with bed mobility PT Short Term Goal 2 (Week 2): Pt will require min A with sit to stand transfers with LRAD PT Short Term Goal 3 (Week 2): Pt will require min A with gait x 10 ft with LRAD  Skilled Therapeutic Interventions/Progress Updates:      Therapy Documentation Precautions:  Precautions Precautions: Fall Restrictions Weight Bearing Restrictions: No LLE Weight Bearing: Weight bearing as tolerated  Pt received semi-reclined in bed with nurse present for morning medication administration. Pt reports 7/10 left lower extremity pain, provided rest/repositioning for relief. Patient required increased time for initiation, cuing, rest breaks, and for completion of tasks throughout session. Utilized therapeutic use of self throughout to promote efficiency.    Pt (S) with supine to sit with HOB elevated to edge of bed. Pt requires min A with sit to stand and SBA for dynamic standing balance without UE support for doffing shorts. Pt educated to return to sitting to remove shorts instead of performing in standing and pt (S) with figure four technique to doff shorts completely.   Pt requested to apply lotion to LE's and set-up assist. Pt requested to [employer] equipment, reacher, for donning on shorts and requires mod A. In standing, pt pulls up briefs and pants and reports he needs to urgently utilize toilet.   Pt CGA with gait with decreased left weight bearing and shuffle pattern ~10 ft to toilet. Pt declined use of BSC over toilet.  Pt continent of large black bowel movement and  PT observed blood and open wound on forearm with toileting and nurse and MD notified. Pt hit grab bar with stand to  sit descent.   Pt set-up for peri-care and CGA with gait to return to bedside. Pt (S) with sit to lying and left semi-reclined in bed with nurse present and alarm on.    Therapy/Group: Individual Therapy  Truitt Leep Truitt Leep PT, DPT  06/26/2023, 8:14 AM

## 2023-06-27 DIAGNOSIS — F4322 Adjustment disorder with anxiety: Secondary | ICD-10-CM

## 2023-06-27 DIAGNOSIS — F4320 Adjustment disorder, unspecified: Secondary | ICD-10-CM

## 2023-06-27 LAB — BASIC METABOLIC PANEL
Anion gap: 7 (ref 5–15)
BUN: 41 mg/dL — ABNORMAL HIGH (ref 8–23)
CO2: 22 mmol/L (ref 22–32)
Calcium: 8.9 mg/dL (ref 8.9–10.3)
Chloride: 106 mmol/L (ref 98–111)
Creatinine, Ser: 1.18 mg/dL (ref 0.61–1.24)
GFR, Estimated: >60 mL/min (ref >60)
Glucose, Bld: 198 mg/dL — ABNORMAL HIGH (ref 70–99)
Potassium: 5.3 mmol/L — ABNORMAL HIGH (ref 3.5–5.1)
Sodium: 135 mmol/L (ref 135–145)

## 2023-06-27 LAB — CBC WITH DIFFERENTIAL/PLATELET
Abs Immature Granulocytes: 0.07 10*3/uL (ref 0.00–0.07)
Basophils Absolute: 0.1 10*3/uL (ref 0.0–0.1)
Basophils Relative: 1 %
Eosinophils Absolute: 0.7 10*3/uL — ABNORMAL HIGH (ref 0.0–0.5)
Eosinophils Relative: 8 %
HCT: 28.7 % — ABNORMAL LOW (ref 39.0–52.0)
Hemoglobin: 8.7 g/dL — ABNORMAL LOW (ref 13.0–17.0)
Immature Granulocytes: 1 %
Lymphocytes Relative: 9 %
Lymphs Abs: 0.8 10*3/uL (ref 0.7–4.0)
MCH: 25 pg — ABNORMAL LOW (ref 26.0–34.0)
MCHC: 30.3 g/dL (ref 30.0–36.0)
MCV: 82.5 fL (ref 80.0–100.0)
Monocytes Absolute: 0.9 10*3/uL (ref 0.1–1.0)
Monocytes Relative: 10 %
Neutro Abs: 6.2 10*3/uL (ref 1.7–7.7)
Neutrophils Relative %: 71 %
Platelets: 550 10*3/uL — ABNORMAL HIGH (ref 150–400)
RBC: 3.48 MIL/uL — ABNORMAL LOW (ref 4.22–5.81)
RDW: 19.7 % — ABNORMAL HIGH (ref 11.5–15.5)
WBC: 8.7 10*3/uL (ref 4.0–10.5)
nRBC: 0 % (ref 0.0–0.2)

## 2023-06-27 LAB — OCCULT BLOOD X 1 CARD TO LAB, STOOL: Fecal Occult Bld: POSITIVE — AB

## 2023-06-27 LAB — GLUCOSE, CAPILLARY
Glucose-Capillary: 158 mg/dL — ABNORMAL HIGH (ref 70–99)
Glucose-Capillary: 155 mg/dL — ABNORMAL HIGH (ref 70–99)
Glucose-Capillary: 97 mg/dL (ref 70–99)
Glucose-Capillary: 164 mg/dL — ABNORMAL HIGH (ref 70–99)

## 2023-06-27 MED ORDER — DOCUSATE SODIUM 100 MG PO CAPS
200.0000 mg | ORAL_CAPSULE | Freq: Two times a day (BID) | ORAL | Status: DC
  Administered 2023-06-27 – 2023-07-04 (×14): 200 mg via ORAL
  Filled 2023-06-27 (×14): qty 2

## 2023-06-27 NOTE — Progress Notes (Signed)
PROGRESS NOTE   Subjective/Complaints:  LBM yesterday- was black- we discussed could be iron, but Hb dropped a little, so waiting for hemoccult.   Also, called renal about K+ 5.3 in spite of lokelma x2-   Admits drinking ~ 2 cups/water/day Has glue in L hip And admits to having sutures in L wrist- will need removed- due to skin CA removal.       ROS:   Pt denies SOB, abd pain, CP, N/V/C/D, and vision changes  Except for HPI  Objective:   DG HIP UNILAT WITH PELVIS 2-3 VIEWS LEFT  Result Date: 06/25/2023 CLINICAL DATA:  Left hip fracture follow-up after pinning. EXAM: DG HIP (WITH OR WITHOUT PELVIS) 2-3V LEFT COMPARISON:  Left hip radiographs 06/10/2023, 06/09/2023; lumbar spine radiographs 02/19/2020 FINDINGS: Redemonstration of left proximal femoral cephalomedullary nail fixation. Unchanged anatomic alignment. Mild-to-moderate bilateral femoroacetabular joint space narrowing. Mild bilateral sacroiliac subchondral sclerosis. L5-S1 posterior element fixation hardware with a screw overlying the left posterior elements, similar to 02/19/2020 lumbar spine radiographs. IMPRESSION: Redemonstration of left proximal femoral cephalomedullary nail fracture fixation. Unchanged anatomic alignment. Electronically Signed   By: Neita Garnet M.D.   On: 06/25/2023 17:09   Recent Labs    06/25/23 0521 06/27/23 0533  WBC 10.3 8.7  HGB 9.1* 8.7*  HCT 29.9* 28.7*  PLT 621* 550*   Recent Labs    06/26/23 0531 06/27/23 0533  NA 137 135  K 5.3* 5.3*  CL 103 106  CO2 23 22  GLUCOSE 216* 198*  BUN 44* 41*  CREATININE 1.47* 1.18  CALCIUM 9.2 8.9    Intake/Output Summary (Last 24 hours) at 06/27/2023 0813 Last data filed at 06/27/2023 0738 Gross per 24 hour  Intake 1852.85 ml  Output 1750 ml  Net 102.85 ml     Pressure Injury 06/16/23 Sacrum Medial Stage 2 -  Partial thickness loss of dermis presenting as a shallow open injury with  a red, pink wound bed without slough. small open area on coccyx in middle of gluteal cleft 1x1 cm (Active)  06/16/23 1848  Location: Sacrum  Location Orientation: Medial  Staging: Stage 2 -  Partial thickness loss of dermis presenting as a shallow open injury with a red, pink wound bed without slough.  Wound Description (Comments): small open area on coccyx in middle of gluteal cleft 1x1 cm  Present on Admission: Yes    Physical Exam: Vital Signs Blood pressure (!) 158/95, pulse 65, temperature 98.2 F (36.8 C), resp. rate 18, height 6\' 2"  (1.88 m), weight 96.9 kg, SpO2 98%.      General: awake, alert, appropriate, sitting up in bed- initially asleep; NAD HENT: conjugate gaze; oropharynx moist CV: regular rate- in afib; no JVD Pulmonary: CTA B/L; no W/R/R- good air movement GI: soft, NT, ND, (+)BS Psychiatric: appropriate- more interactive daily Neurological: Ox3   Cervical back: Tenderness present.     Comments: Psoriatic arthritis changes esp in hands    R shoulder has ~ 20-25 degrees of active ROM max RUE- deltoid 2-/5; otherwise 4+/5 in RUE LUE shoulder has >90 degrees and 4+/5 in LUE RLE- HF proximally 3-/5 and sitally 4/5 LLE- HF 2-/5; KE 2/5; and  DF/PF 4/5-unchanged 8/18    Assessment/Plan: 1. Functional deficits which require 3+ hours per day of interdisciplinary therapy in a comprehensive inpatient rehab setting. Physiatrist is providing close team supervision and 24 hour management of active medical problems listed below. Physiatrist and rehab team continue to assess barriers to discharge/monitor patient progress toward functional and medical goals  Care Tool:  Bathing    Body parts bathed by patient: Right arm, Left arm, Left upper leg, Chest, Front perineal area, Abdomen, Face, Buttocks, Right upper leg   Body parts bathed by helper: Right lower leg, Left lower leg     Bathing assist Assist Level: Minimal Assistance - Patient > 75%     Upper Body  Dressing/Undressing Upper body dressing   What is the patient wearing?: Pull over shirt    Upper body assist Assist Level: Minimal Assistance - Patient > 75%    Lower Body Dressing/Undressing Lower body dressing            Lower body assist Assist for lower body dressing: Moderate Assistance - Patient 50 - 74%     Toileting Toileting    Toileting assist Assist for toileting: Moderate Assistance - Patient 50 - 74%     Transfers Chair/bed transfer  Transfers assist     Chair/bed transfer assist level: Contact Guard/Touching assist     Locomotion Ambulation   Ambulation assist   Ambulation activity did not occur: Safety/medical concerns  Assist level: Minimal Assistance - Patient > 75% Assistive device: Walker-rolling Max distance: 37 feet   Walk 10 feet activity   Assist  Walk 10 feet activity did not occur: Safety/medical concerns  Assist level: Minimal Assistance - Patient > 75% Assistive device: Walker-rolling   Walk 50 feet activity   Assist Walk 50 feet with 2 turns activity did not occur: Safety/medical concerns         Walk 150 feet activity   Assist Walk 150 feet activity did not occur: Safety/medical concerns         Walk 10 feet on uneven surface  activity   Assist Walk 10 feet on uneven surfaces activity did not occur: Safety/medical concerns         Wheelchair     Assist Is the patient using a wheelchair?: Yes Type of Wheelchair: Manual    Wheelchair assist level: Dependent - Patient 0%      Wheelchair 50 feet with 2 turns activity    Assist        Assist Level: Dependent - Patient 0%   Wheelchair 150 feet activity     Assist      Assist Level: Dependent - Patient 0%   Blood pressure (!) 158/95, pulse 65, temperature 98.2 F (36.8 C), resp. rate 18, height 6\' 2"  (1.88 m), weight 96.9 kg, SpO2 98%.  Medical Problem List and Plan: 1. Functional deficits secondary to  L intertrochanteric hip  fx s/p IM nail WBAT             -patient may  shower             -ELOS/Goals: 10-14 days supervision             D/c 9/6  Con't CIR PT and OT-waiting for Cosentyx Called renal 2.  Antithrombotics: -DVT/anticoagulation:  Pharmaceutical: Eliquis             -antiplatelet therapy: Plavix , change protonix to pepcid as this will help efficacy    3. 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   No complaints of pain today, continue current regimen  8/13- said pain not controlled from hour 3-4- but declined Oxycontin for pain-stopped last dose of Nabumetone- since Cr up so much  8/14- won't change pain meds since more confused per staff- pneumonia vs chronic? Pt asking again for q3 hours prn meds- explained will not do this.   8/15- pt agreed to keep pain meds aq4 hours- educated has been a little confused- pt agreed  8/19-8/20-  pt doing better, but wants to add Cosentyx- as below-  4. Mood/Behavior/Sleep: LCSW to evaluate and provide emotional support             -antipsychotic agents: Vilazodone 40 mg daily             -continue Remeron 30 mg q HS             -continue Provigil 100 mg daily   8/16- will increase Vistaril to BID prn   5. Neuropsych/cognition: This patient is capable of making decisions on his own behalf.   6. Skin/Wound Care: Routine skin care checks             -sacral pressure injury; continue local care/pressure relief             -monitor surgical incision             L elbow and R lower leg wounds- local wound care  8-12:  Dressing removed and wounds observed as above; no obvious source of infection, but if no other findings would be most likely soft tissue infection from left elbow or hip..  See below. 8/21- will get sutures out of L wrist and L hip is glued.  7. Fluids/Electrolytes/Nutrition: Routine Is and Os and follow-up chemistries             -continue Mag-ox 400 mg daily   8: Hypertension: monitor TID and prn -Continue Imdur 30 mg daily -continue Demadex 20 mg daily   Vitals:   06/26/23 2010 06/27/23 0326  BP:  (!) 158/95  Pulse: 62 65  Resp: 12 18  Temp:  98.2 F (36.8 C)  SpO2: 98% 98%   Fair control 8/18  8/19-8/20 BP controlled- con't regimen  8/21- BP a little elevated this AM 158 systolic- usually 130s-150s- might benefit from hydrochlorothiazide? 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  8/13- CBGs running 199 to 265 in last 24 hours- since Semglee reduced. Don't feel comfortable adding PO meds since Cr up to 1.78 today  CBG (last 3)  Recent Labs    06/26/23 1633 06/26/23 2036 06/27/23 0559  GLUCAP 112* 173* 158*  Reduce semglee , cannot use metformin, also allergies preclude glimipride , consider jardiance, took metformin at home may restart if creat 1.2 or better   8/19- CBG's 122 to 249- needs something, but Jardiance cannot use due to hyperkalemia and Metformin not appropriate due to Variable/labile Cr/BUN- Semglee was reduced yesterday- will  give 24 hours to follow trend and treat hyperkalemia.   8/20- CBGs 110-233- will try Tradjenta 5 mg daily since not renally dosed  8/21- CBG's looking much better 1120-173 11: Venous stasis: RLE pre-tibial ulcer; continue local wound care   12: S/p left IM nail Dr. [name]             -weight bearing at tolerated   13: Sleep apnea: CPAP at 17 nightly   14: Left rib fractures: pain control and pulmonary toilet   15: Paroxysmal atrial fib: on Eliquis, Imdur, Demadex (rate controlled no other meds at home)   8/12: EKG with normal ventricular rate, however in atrial flutter, new diagnosis.  Not on rate controlling medications, cardiology consulted for input.. Getting IVF as below. Patient otherwise stable.   8/14- not new per chart- had pAfib- although pt said not aware (poor STM)- con't since rate appears controlled OFF meds- actually running 50s to 90s  8/15- HR in 50s-60s- so not appropriate for rate meds.   8/19- HR running in the  60s 16: BPH: continue Flomax   17:Psoriasis/psoriatic arthritis: his family brought his Desonide 0.05% cream and clobetasol 0.05% cream to apply to hands, elbows, knees daily as needed             -If can get Cosentyx from home, can give to him- 2 weeks late, will make him hurt too much  8/11- educated pt don't have Cosentyx in hospital- will need ot bring form home  8/19- asked pt to have family bring in Cosentyx- think this will help his pain and risks of infection outweighed by benefits of pain reduction  8/20- family to bring in  18: Rosacea: his family brought in his Soolantra (ivermectin) 1% cream to apply to face daily as needed   8/11- ordered by pharmacy today  8/15- meds brought in and sent to pharmacy 19: s/p CABG 2023   20: Osteoarthritis: S/p left shoulder arthroplasty; needs the right shoulder done as well- has R frozen shoulder as a result.    21. 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    8/11- BUN up to 60- very dry- and Cr up some- will give IVFs 60cc/hour after therapy til tomorrow and recheck labs in AM  8/12: Creatinine still increasing 1.99, along with increased potassium and downtrending sodium; increase fluid rate today to 1 L IV fluid at 75/h, urine studies pending, repeat BMP at 2 PM to ensure no further worsening -if creatinine increasing or K increasing, will consult nephrology at that time.  May need bicarbonate.  -  Urinalysis normal, urine sodium and serum osmolality appear prerenal, awaiting urine osmolality; K responsive to IVF, continue for 1L, Cr/BUN stable will repeat in AM  8/13- Wil give another 12 hours of NS IVFs 100cc/hour- Cr down to 1.78 and BUN still 84-   8/14- Cr 1.45 down from 1.78 and BUN 67 down from 84- will give 1 more 12 hours of IVFs and recheck in AM   8/16- labs came back late- Cr 1.35 and BUN 48- improving- con't to push PO fluids.   8/19- BUN down to 34 and Cr 1.24- much improved- con't to monitor weekly. Will give Lokelma for K+ 5.2- will recheck in AM  8/20- Got Lokelma but K+ up further to 5.3- spoke with pharmacy- will give Lokelma 10 G BID x 2 doses and give IVF NS- 75cc/hour x 16 hours to start  at 3pm- and recheck in AM- if K+ isn't down in AM, will call renal.   8/21- K+ 5.3 even after lokelma BID- we discussed that wait and recheck in AM- they aren't worried until >5.5- which makes sense- will recheck in AM and go from there- might benefit from hydrochlorothiazide? Cr down to 1.18 and BUN still 41- pt drinking 2 cups/day of water- encouraged to drink 6-8 cups/day.  22. - Walked, but has Spina bifida with frequent falls- will d/w therapy.    23. Leukocytosis - resolved now on last day of abx   8/11- afebrile- feel good- will recheck in AM- if doesn't improve, will check U/A and Cx and CXR- having nurse check wounds as well- just to make sure-   -8-12: WBCs continue uptrending, 17.5  today, along with thrombocytosis and anemia.  Ordered urinalysis with reflex to culture, chest x-ray, initiate IV fluids as above.   Urinalysis within normal limits   Chest x-ray pending - RLL infiltrate, significant from last CXR, concerning for aspiration pneumonia. Start IV Zosyn 4.75 mg Q8H for 5 days, SLP consult placed for swallow eval. Labs in AM    8/13- WBC down to 10.7 from 17.5- doing much better- con't Zosyn for pneumonia- asked pt to use ICS q2 hours while awake.   Completed Zosyn afebrile  24. Constipation  8/15- Will give Sorbitol 30cc- if no large BM by then  8/16- Large BM yesterday AM before Sorbitol 8/19- LBM 2 days ago- is hard/feels constipated- will change Colace and Senna to Senna-S and increase to 2 tabs BID  8/20- LBM yesterday 8/21- LBM yesterday  25. Anemia  8/15- has dropped, however at the time it was higher, had been hemo-concentrated- and is now doing better- will monitor closely.   8/16- Hb back up to 8.8- doing better 26. Anal fissure  8/16- will order/d/w nursing meds from home for anal fissure- since we have in hospital.  27. Osteoporosis-   8/19- pt reports going ot start Fosamax, even before had fx- so needs it! D/w pt today and need for getting it started as soon as leaves.  28. Black stools  8/21- ordered hemoccult- hasn't had another BM yet. Also on Iron TID which could play a part- of note, Hb 8.7- was 8.8-9/1- but also is more hydrated- will recheck in AM-    I spent a total of  51  minutes on total care today- >50% coordination of care- due to called renal- we discussed rechecking K+ in AM and then calling back if need be- also d/w nursing about sutures in L wrist as well as hemoccult. Havne't gotten one  yet since last BM was Yesterday AM when I then ordered hemoccult.     LOS: 11 days A FACE TO FACE EVALUATION WAS PERFORMED  Raford Brissett 06/27/2023, 8:13 AM    Addendum:

## 2023-06-27 NOTE — Progress Notes (Signed)
Occupational Therapy Session Note  Patient Details  Name: DACORION ASMAR MRN: [id number] Date of Birth: [birth year]  {CHL IP REHAB OT TIME CALCULATIONS:[id number]}   Short Term Goals: Week 2:  OT Short Term Goal 1 (Week 2): Pt will complete LB dressing with Min A with AE as necessary OT Short Term Goal 2 (Week 2): Pt will complete grooming in standing with Min A OT Short Term Goal 3 (Week 2): Pt will complete footwear Max A  Skilled Therapeutic Interventions/Progress Updates:      Therapy Documentation Precautions:  Precautions Precautions: Fall Restrictions Weight Bearing Restrictions: Yes LLE Weight Bearing: Weight bearing as tolerated General: "subjective***" Pt supine in bed upon OT arrival, agreeable to OT session. "subjective***" Pt seated in W/C upon OT arrival, agreeable to OT.  Pain:  *** pain reported in ***, activity, intermittent rest breaks, distractions provided for pain management, pt reports tolerable to proceed.   ADL:  Balance  Exercises:  Other Treatments:    ***Pt seated in W/C at end of session with W/C alarm donned, call light within reach and 4Ps assessed.  ***Pt supine in bed with bed alarm activated, 2 bed rails up, call light within reach and 4Ps assessed.    Therapy/Group: Individual Therapy Velia Meyer, OTD, OTR/L 06/27/2023, 10:35 PM

## 2023-06-27 NOTE — Discharge Instructions (Addendum)
Inpatient Rehab Discharge Instructions  Dylan Bruce Discharge date and time:  07/10/2023  Activities/Precautions/ Functional Status: Activity: no lifting, driving, or strenuous exercise until cleared by MD; may bear weight on legs as tolerated Diet: cardiac diet Wound Care: keep wound clean and dry; follow-up lower leg ulcer care with PCP Functional status:  ___ No restrictions     ___ Walk up steps independently _x__ 24/7 supervision/assistance   ___ Walk up steps with assistance ___ Intermittent supervision/assistance  ___ Bathe/dress independently ___ Walk with walker     ___ Bathe/dress with assistance ___ Walk Independently    ___ Shower independently ___ Walk with assistance    __x_ Shower with assistance __x_ No alcohol     ___ Return to work/school ________  Special Instructions: No driving, alcohol consumption or tobacco use.  Recommend checking fingerstick blood sugars four (4) times daily and record. Bring this information with you to follow-up appointment with PCP.   COMMUNITY REFERRALS UPON DISCHARGE:    Home Health:   PT   & OT                 Agency:[facility] Phone: [phone]   Medical Equipment/Items Ordered:ROLLING WALKER, PATIENT TO GET BARIATRIC COMMODE and TUB BENCH ON OWN DUE TO NOT COVERED, SINCE HE DOES NOT MEET THE CRITERIA FOR BARIATRIC ONE                                                 Agency/Supplier: [facility]   [phone]   My questions have been answered and I understand these instructions. I will adhere to these goals and the provided educational materials after my discharge from the hospital.  Patient/Caregiver Signature _______________________________ Date __________  Clinician Signature _______________________________________ Date __________  Please bring this form and your medication list with you to all your follow-up doctor's appointments.   Information on my medicine - ELIQUIS (apixaban)  This medication  education was reviewed with me or my healthcare representative as part of my discharge preparation.    Why was Eliquis prescribed for you? Eliquis was prescribed for you to reduce the risk of a blood clot forming that can cause a stroke if you have a medical condition called atrial fibrillation (a type of irregular heartbeat).  What do You need to know about Eliquis ? Take your Eliquis TWICE DAILY - one tablet in the morning and one tablet in the evening with or without food. If you have difficulty swallowing the tablet whole please discuss with your pharmacist how to take the medication safely.  Take Eliquis exactly as prescribed by your doctor and DO NOT stop taking Eliquis without talking to the doctor who prescribed the medication.  Stopping may increase your risk of developing a stroke.  Refill your prescription before you run out.  After discharge, you should have regular check-up appointments with your healthcare provider that is prescribing your Eliquis.  In the future your dose may need to be changed if your kidney function or weight changes by a significant amount or as you get older.  What do you do if you miss a dose? If you miss a dose, take it as soon as you remember on the same day and resume taking twice daily.  Do not take more than one dose of ELIQUIS at the same time to make up a  missed dose.  Important Safety Information A possible side effect of Eliquis is bleeding. You should call your healthcare provider right away if you experience any of the following: Bleeding from an injury or your nose that does not stop. Unusual colored urine (red or dark brown) or unusual colored stools (red or black). Unusual bruising for unknown reasons. A serious fall or if you hit your head (even if there is no bleeding).  Some medicines may interact with Eliquis and might increase your risk of bleeding or clotting while on Eliquis. To help avoid this, consult your healthcare provider  or pharmacist prior to using any new prescription or non-prescription medications, including herbals, vitamins, non-steroidal anti-inflammatory drugs (NSAIDs) and supplements.  This website has more information on Eliquis (apixaban): http://www.eliquis.com/eliquis/home

## 2023-06-27 NOTE — Progress Notes (Signed)
    Nasal bleed

## 2023-06-27 NOTE — Progress Notes (Signed)
Physical Therapy Session Note  Patient Details  Name: Dylan Bruce MRN: [id number] Date of Birth: Jul 31, 1946  Today's Date: 06/27/2023 PT Individual Time: 0800-0856 PT Individual Time Calculation (min): 56 min   Short Term Goals: Week 2:  PT Short Term Goal 1 (Week 2): Pt will require min A with bed mobility PT Short Term Goal 2 (Week 2): Pt will require min A with sit to stand transfers with LRAD PT Short Term Goal 3 (Week 2): Pt will require min A with gait x 10 ft with LRAD  Skilled Therapeutic Interventions/Progress Updates:      Therapy Documentation Precautions:  Precautions Precautions: Fall Restrictions Weight Bearing Restrictions: No LLE Weight Bearing: Weight bearing as tolerated  Pt received semi-reclined, agreeable to PT and with 7/10 left hip pain. Nurse administered pain medications in session. Patient required increased time for initiation, cuing, rest breaks, and for completion of tasks throughout session. Utilized therapeutic use of self throughout to promote efficiency.    Pt (S) with supine to sit with head of bed elevated and CGA with multiple sit to stand transfers as pt found to be incontinent of and required increased time and set-up assist for peri-care. Pt requesting need for bowel movement and reports difficulty with positioning with regular toilet seat with standard size BSC. Pt requesting larger BSC therefore PT obtained and placed on toilet.   Pt CGA with sit to stand and gait ~10 ft to restroom and CGA with stand to sit as pt demonstrates improved eccentric control. Therapist positioned hat in commode for stool sample per MD request. Pt left in care of nurse tech.    Therapy/Group: Individual Therapy  Truitt Leep Truitt Leep PT, DPT  06/27/2023, 8:24 AM

## 2023-06-27 NOTE — Progress Notes (Signed)
Occupational Therapy Session Note  Patient Details  Name: Dylan Bruce MRN: [id number] Date of Birth: 1946/04/05  Today's Date: 06/27/2023 OT Individual Time: 8756-4332 & 9518-8416 OT Individual Time Calculation (min): 60 min & 74 min  Short Term Goals: Week 2:  OT Short Term Goal 1 (Week 2): Pt will complete LB dressing with Min A with AE as necessary OT Short Term Goal 2 (Week 2): Pt will complete grooming in standing with Min A OT Short Term Goal 3 (Week 2): Pt will complete footwear Max A  Skilled Therapeutic Interventions/Progress Updates:      Therapy Documentation Precautions:  Precautions Precautions: Fall Restrictions Weight Bearing Restrictions: No LLE Weight Bearing: Weight bearing as tolerated  Session 1 General: "I look like an overgrown hippie." Pt seated in recliner upon OT arrival, agreeable to OT.  Pain:  6/10 pain reported in Lt hip, activity, intermittent rest breaks, distractions provided for pain management, pt reports tolerable to proceed.   ADL: Grooming: distant supervision seated at sink for brushing hair and wiping face Oral hygiene: distant supervision seated at sink, able to manage containers and dentures Transfers:  -recliner> W/C SPT, Min A for stand and CGA emerging SBA wit RW for pivoting with increased time -Pt ambulated 58 ft SBA with RW with no LOB/SOB with increased time to complete, required rest break after d/t increased fatigue  Bed mobility: SBA with increased time to manage LLE onto bed    Pt supine in bed with bed alarm activated, 2 bed rails up, call light within reach and 4Ps assessed.   Session 2 General: "This is my wife and daughter." Pt seated in recliner upon OT arrival, agreeable to OT. Wife and daughter present for therapy session.  Pain: unrated but reported increased pain in Lt hip, activity, intermittent rest breaks, distractions provided for pain management, pt reports tolerable to proceed.   ADL: footwear: Mod  A with use of sock aide and shoe horn for socks and shoes, pt required increased assistance with donning shoes Tub/shower transfer: CGA W/C><TTB in tub.\/shower combo for preparation for D/C d/t pt having tub/shower at home. Pt educated on tucking shower curtain under buttocks in order to prevent water from spilling into bathroom Transfers: CGA for transfers this session d/t increased fatigue and pain this afternoon Functional ambulation: Pt ambulated ~50 ft with RW CGA with no LOB/SOB although increased fatigue requiring extended rest break Bed mobility: Mod A this date d/t fatigue from ambulation  Other Treatments: Pt wife and daughter educated on pt current level of assist and current DME needs. Pt wife and daughter educated on how to provide assist when necessary for LE management and transfers. Pt wife and daughter educated on current AE pt uses in order for increased independence with ADLs. Pt wife and daughter observed all of OT treatment session. Pt wife and daughter interested in scheduling family training but wanted to consult son first. Social worker Kriste Basque made aware of desire for family training.   Pt supine in bed with bed alarm activated, 2 bed rails up, call light within reach and 4Ps assessed.   Therapy/Group: Individual Therapy  Velia Meyer, OTD, OTR/L 06/27/2023, 4:09 PM

## 2023-06-27 NOTE — Progress Notes (Signed)
Patient ID: Dylan Bruce, male   DOB: Sep 16, 1946, 77 y.o.   MRN: [id number]  Met with pt, wife and daughter to inform of team conference yesterday progress toward his goals and discharge still 9/6. Wife is looking for an aide to stay with him while she works. Aware of Home health and will work on any equipment needs.

## 2023-06-27 NOTE — Consult Note (Signed)
Neuropsychological Consultation Comprehensive Inpatient Rehab   Patient:   Dylan Bruce   DOB:   28-Dec-1945  MR Number:  [id number]  Location:  [facility] [address] [place] [address] Dept: [phone] Loc: [phone]           Date of Service:   06/27/2023  Start Time:   1PM End Time:   2 PM  Provider/Observer:  Arley Phenix, Psy.D.       Clinical Neuropsychologist       Billing Code/Service: [phone]  Reason for Service:    Dylan Bruce is a 77 year old male referred for neuropsychological consultation during his ongoing admission onto the comprehensive inpatient rehabilitation unit.  Patient had a mechanical fall with return presentation to emergency department on 06/09/2023 and CT revealing this time a left hip fracture.  There was also a left rib fracture.  Patient had multiple falls recently.  Patient underwent left IM nail by Dr. [name] on 8/4.  Patient with history of PAF and has consistently been in atrial flutter.  Patient has had chronic back pain and long-term use of narcotic pain medication to manage his pain.  Patient is also been diagnosed with obstructive sleep apnea for some time.  During his initial presentation patient family described change in sleep pattern over the past couple of months that likely have contributed to his fall as the patient had sudden onset of sleepiness and would fall asleep quite rapidly.  There was a disturbance in sleep with the patient being unable to sleep well at night that had been worsening but he has been quite compliant with his CPAP use.  He was not using his CPAP during the day when he would nap.  Patient was started on Provigil by hospitalist and this is continued.  The recommendation was made for follow-up neurology.  In discussions with the patient today he reports that he actually has an appointment with [facility] hospitals around his obstructive  sleep apnea for new assessment that is scheduled for the day of his planned discharge here.  Patient stated he would like to have transfer referral for follow-up for his sleep apnea here in [place] for convenience and to get other opinions about his sleep disorder.  Patient's family requested that the referral be made to [facility] neurology with Dr. Frances Furbish.  Patient has multiple medical issues and long-term pain medication usage which have created challenge for treating his pain adequately during his admission.  He is improving and actively participating in therapeutic interventions.  During today's visit the patient was oriented x 4 with good cognition.  Patient's daughter and wife were both present and remained in the room for patient's request.  Patient and family asked pertinent questions around his sleep issues that have progressively worsened over the past couple of months and had questions with the hospitalist suggestion and using the term narcolepsy and description of what was going on.  Given the patient's age and his medical condition and change in sleep patterns with very little sleep at night for some time I suspect that this is a ongoing and acute worsening sleep disturbance associated with obstructive sleep apnea and significant change in life once he discharged.  Gave practical behavioral interventions around trying to normalize sleep pattern post discharge.  Patient and family again reiterated their desire to change from [facility] hospitals to [facility] neurologic Associates for his issues with sleep disturbance and obstructive sleep apnea.  Is been about 6 years since he last had any adjustments to his CPAP machine and given the change in his sleep and consideration of other potential etiological factors they preferred having neurology addressed this and I will have the attending here on our unit place referral to Dr. Teofilo Pod office.  Patient denied significant depression but has had difficulty coping  with his significant loss of function, increased falls of now fractured hip and broken rib with fall.  HPI for the current admission:    HPI: Dylan Bruce is a 77 year old male who fell and initial x-rays at [facility] negative for fracture. Returned to ED on 8/03 and CT revealed intertrochanteric left hip fracture. Also, imaging significant for left rib fracture. Transferred to [facility] for definitive care. Underwent left IM nail by Dr [name] on 8/04. History of PAF: has consistently been in 4:1 atrial flutter. Normal LVEF. WBAT. Tolerating diet. WOC RN consulted for sacral pressure injury and venous stasis ulcer. Has chronic pain (see below) and is upset the last day or two with how his pain is being managed and the length of time it takes to have his pain medication brought to him. Reports decreased strength and mobility in right shoulder.   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 S/p left shoulder arthroplasty. He has history of chronic back pain and takes approximately 4 Percocet 10/325 daily (Dr. Lindwood Qua), Lyrica 100 mg BID, tizanidine 4 mg at bedtime and cyclobenzaprine 10 mg at bedtime.   The patient requires inpatient physical medicine and rehabilitation evaluations and treatment secondary to dysfunction due to left femur fracture.     Pt reports took Percocet 10/325 mg- 2 tabs QID at home prior to fracture- feels like getting too low of dose here. Takes for shoulder, and chronic back pain- hx of back surgery/fusion, and spina bifida and frequent falls.   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 Clifton James and Dr. 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   1st degree AV block 02/21/2023   PAF (paroxysmal atrial fibrillation) (HCC) 02/21/2023   Acute CHF (congestive heart failure) (HCC) 02/19/2023   Elevated troponin 01/11/2023   Acute on chronic systolic heart failure (HCC) 01/11/2023   Overweight (BMI 25.0-29.9) 01/08/2023   NSTEMI (non-ST elevated myocardial infarction) (HCC) 01/07/2023   Acute on chronic diastolic CHF (congestive heart  failure) (HCC) 01/07/2023   Diabetic neuropathy (HCC) 01/07/2023   Chronic kidney disease, stage 3b (HCC) 01/07/2023   Chronic gout of foot 09/15/2020   Anxiety 06/20/2018   Chest pain 06/17/2018   Anemia, unspecified 01/31/2018   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 (HCC) 06/05/2017   Depression 01/14/2014   Essential hypertension 09/10/2013   Chronic back pain 05/07/2013   Bradycardia 08/14/2012   Severe aortic stenosis 08/14/2012   CAD (coronary artery disease) of artery bypass graft 07/17/2012   Type 2 diabetes mellitus with hyperlipidemia (HCC) 09/03/2009   Hyperlipidemia 09/03/2009   Asthma 09/03/2009   GERD 09/03/2009   PSORIASIS 09/03/2009   ARTHRITIS 09/03/2009   Spina bifida (HCC) 09/03/2009   Sleep apnea 09/03/2009    Behavioral Observation/Mental Status:   TOMAR CLIPPARD   presents as a 77 y.o.-year-old Right handed Caucasian Male who appeared his stated age. his dress was Appropriate and he was Well Groomed and his manners were Appropriate to the situation.  his participation was indicative of Appropriate and Attentive behaviors.  There were physical disabilities noted.  he displayed an appropriate level of cooperation and motivation.    Interactions:    Active Appropriate  Attention:   within normal limits and attention span and concentration were age appropriate  Memory:   within normal limits; recent and remote memory intact  Visuo-spatial:   not examined  Speech (Volume):  normal  Speech:   normal; normal  Thought Process:  Coherent and Relevant  Coherent, Directed, and Logical  Though Content:  WNL; not suicidal and not homicidal  Orientation:   person, place, time/date, and situation  Judgment:   Good  Planning:   Fair  Affect:    Appropriate  Mood:    Dysphoric  Insight:   Good  Intelligence:   normal  Psychiatric History:  Patient has prior history with some anxiety particularly around his overall medical status, chronic pain etc.  Family Med/Psych History:  Family History  Problem Relation Age of Onset   Valvular heart disease Father    Prostate cancer Father     Impression/DX:   CLEMMON BROOKE is a 77 year old male referred for neuropsychological consultation during his ongoing admission onto the comprehensive inpatient rehabilitation unit.  Patient had a mechanical fall with return presentation to emergency department on 06/09/2023 and CT revealing this time a left hip fracture.  There was also a left rib fracture.  Patient had multiple falls recently.  Patient underwent left IM nail by Dr. [name] on 8/4.  Patient with history of PAF and has consistently been in atrial flutter.  Patient has had chronic back pain and long-term use of narcotic pain medication to manage his pain.  Patient is also been diagnosed with obstructive sleep  apnea for some time.  During his initial presentation patient family described change in sleep pattern over the past couple of months that likely have contributed to his fall as the patient had sudden onset of sleepiness and would fall asleep quite rapidly.  There was a disturbance in sleep with the patient being unable to sleep well at night that had been worsening but he has been quite compliant with his CPAP use.  He was not using his CPAP during the day when he would nap.  Patient was started on Provigil by hospitalist and this is continued.  The recommendation was made for follow-up neurology.  In discussions with the patient today he reports that he actually has an appointment with [facility]  [facility] hospitals around his obstructive sleep apnea for new assessment that is scheduled for the day of his planned discharge here.  Patient stated he would like to have transfer referral for follow-up for his sleep apnea here in [place] for convenience and to get other opinions about his sleep disorder.  Patient's family requested that the referral be made to [facility] neurology with Dr. Frances Furbish.  Patient has multiple medical issues and long-term pain medication usage which have created challenge for treating his pain adequately during his admission.  He is improving and actively participating in therapeutic interventions.  During today's visit the patient was oriented x 4 with good cognition.  Patient's daughter and wife were both present and remained in the room for patient's request.  Patient and family asked pertinent questions around his sleep issues that have progressively worsened over the past couple of months and had questions with the hospitalist suggestion and using the term narcolepsy and description of what was going on.  Given the patient's age and his medical condition and change in sleep patterns with very little sleep at night for some time I suspect that this is a ongoing and acute worsening sleep  disturbance associated with obstructive sleep apnea and significant change in life once he discharged.  Gave practical behavioral interventions around trying to normalize sleep pattern post discharge.  Patient and family again reiterated their desire to change from [facility] hospitals to [facility] Associates for his issues with sleep disturbance and obstructive sleep apnea.  Is been about 6 years since he last had any adjustments to his CPAP machine and given the change in his sleep and consideration of other potential etiological factors they preferred having neurology addressed this and I will have the attending here on our unit place referral to Dr. Teofilo Pod office.  Patient denied significant depression but has had difficulty coping with his significant loss of function, increased falls of now fractured hip and broken rib with fall.    Diagnosis:    Adjustment disorder with anxious mood         Electronically Signed   _______________________ Arley Phenix, Psy.D. Clinical Neuropsychologist

## 2023-06-28 LAB — BASIC METABOLIC PANEL
Anion gap: 9 (ref 5–15)
BUN: 41 mg/dL — ABNORMAL HIGH (ref 8–23)
CO2: 21 mmol/L — ABNORMAL LOW (ref 22–32)
Calcium: 9.1 mg/dL (ref 8.9–10.3)
Chloride: 106 mmol/L (ref 98–111)
Creatinine, Ser: 1.06 mg/dL (ref 0.61–1.24)
GFR, Estimated: >60 mL/min (ref >60)
Glucose, Bld: 95 mg/dL (ref 70–99)
Potassium: 5.2 mmol/L — ABNORMAL HIGH (ref 3.5–5.1)
Sodium: 136 mmol/L (ref 135–145)

## 2023-06-28 LAB — GLUCOSE, CAPILLARY
Glucose-Capillary: 78 mg/dL (ref 70–99)
Glucose-Capillary: 158 mg/dL — ABNORMAL HIGH (ref 70–99)
Glucose-Capillary: 212 mg/dL — ABNORMAL HIGH (ref 70–99)
Glucose-Capillary: 170 mg/dL — ABNORMAL HIGH (ref 70–99)
Glucose-Capillary: 130 mg/dL — ABNORMAL HIGH (ref 70–99)

## 2023-06-28 MED ORDER — CAMPHOR-MENTHOL 0.5-0.5 % EX LOTN: TOPICAL_LOTION | CUTANEOUS | Status: DC | PRN

## 2023-06-28 MED ORDER — BUSPIRONE HCL 10 MG PO TABS
5.0000 mg | ORAL_TABLET | Freq: Three times a day (TID) | ORAL | Status: DC
  Administered 2023-06-28 – 2023-07-04 (×18): 5 mg via ORAL
  Filled 2023-06-28 (×18): qty 1

## 2023-06-28 NOTE — Progress Notes (Addendum)
Patient ID: Dylan Bruce, male   DOB: [birth year], 77 y.o.   MRN: [id number]  Awaiting return call from wife to schedule family training prior to discharge.   11:45 AM Spoke with wife she has spoken with other family members set up family training for 8/30 10-12. Placed on calendar

## 2023-06-28 NOTE — Progress Notes (Signed)
Physical Therapy Session Note  Patient Details  Name: Dylan Bruce MRN: [id number] Date of Birth: 1946/04/24  Today's Date: 06/28/2023 PT Individual Time: 1000-1045, 4098-1191  PT Individual Time Calculation (min): 45 min , 45 min   Short Term Goals: Week 2:  PT Short Term Goal 1 (Week 2): Pt will require min A with bed mobility PT Short Term Goal 2 (Week 2): Pt will require min A with sit to stand transfers with LRAD PT Short Term Goal 3 (Week 2): Pt will require min A with gait x 10 ft with LRAD  Skilled Therapeutic Interventions/Progress Updates:      Therapy Documentation Precautions:  Precautions Precautions: Fall Restrictions Weight Bearing Restrictions: Yes LLE Weight Bearing: Weight bearing as tolerated   Treatment Session 1:   Pt received seated at bedside, agreeable to PT session with emphasis on gait training. PT provided repositioning and rest breaks for unrated left lower extremity pain. Patient required increased time for initiation, cuing, rest breaks, and for completion of tasks throughout session. Utilized therapeutic use of self throughout to promote efficiency.    Pt dependently transported for time management in TIS w/c to main gym. Pt min A with sit<>stand and ambulated 100 ft + 30 ft with RW CGA. Pt presents with decreased step length and able to improve with visual and verbal cueing. Pt encouraged to "step through" to facilitate step through as opposed to step to gait pattern.   Pt dependently transported to room and left seated at bedside with all needs in reach and alarm on.    Treatment Session 2:   Pt received in care of NT following toileting and PT present for hand off. Pt ambulated with CGA x 50 ft with min verbal cues to increase step length. Pt reported fatigue and dependently transported to main gym. Pt engaged in discussion regarding discharge planning with PT. 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 Pt reports his spouse has a car that is low to the ground or a friend who drives an SUV. PT recommended use of w/c at discharge for household and community mobility as pt fatigues quickly. Pt reports his spouse will be unable to push a wheelchair at all therefore pt will require a standard w/c that can be self propelled. Plan to switch TIS for standard chair as pt with improved activity tolerance and pt receptive. Pt reports he will continue discharge plan discussions with his son and PT recommended for his son to attend sessions to further problem solve. Pt returned by w/c to room as he reported increased leg pain (unrated) and pt insistent on viewing personal items in drawer. Pt min A with ambulatory transfer to bed and PT positioned drawer in front of patient and had nurse tech supervise pt while seated edge of bed.   Therapy/Group: Individual Therapy  Truitt Leep Truitt Leep PT, DPT  06/28/2023, 10:55 AM

## 2023-06-28 NOTE — Progress Notes (Signed)
PROGRESS NOTE   Subjective/Complaints:   Pt kept mentioning that was pouring water on ground and peeing in cups- thinks no urinal, but according to nurses, urinals right there. Very confused and they found he was taking benadryl from home 50 mg at a time- doesn't remember how many he took.   Admits feels cognitively intact this AM, but denied was confused last night, even though was per staff and his own description.   Admits taking benadryl for anxiety.   Not itching like he had said originally.  Not eating K+ in diet it sounds like.  Says was really hot and sweaty from air bed last night and wants to go back to regular bed.     ROS:  Pt denies SOB, abd pain, CP, N/V/C/D, and vision changes   Except for HPI  Objective:   No results found. Recent Labs    06/27/23 0533  WBC 8.7  HGB 8.7*  HCT 28.7*  PLT 550*   Recent Labs    06/27/23 0533 06/28/23 0531  NA 135 136  K 5.3* 5.2*  CL 106 106  CO2 22 21*  GLUCOSE 198* 95  BUN 41* 41*  CREATININE 1.18 1.06  CALCIUM 8.9 9.1    Intake/Output Summary (Last 24 hours) at 06/28/2023 0843 Last data filed at 06/28/2023 0748 Gross per 24 hour  Intake 3568.59 ml  Output 1725 ml  Net 1843.59 ml     Pressure Injury 06/16/23 Sacrum Medial Stage 2 -  Partial thickness loss of dermis presenting as a shallow open injury with a red, pink wound bed without slough. small open area on coccyx in middle of gluteal cleft 1x1 cm (Active)  06/16/23 1848  Location: Sacrum  Location Orientation: Medial  Staging: Stage 2 -  Partial thickness loss of dermis presenting as a shallow open injury with a red, pink wound bed without slough.  Wound Description (Comments): small open area on coccyx in middle of gluteal cleft 1x1 cm  Present on Admission: Yes    Physical Exam: Vital Signs Blood pressure (!) 153/75, pulse 78, temperature 98 F (36.7 C), temperature source Oral, resp. rate  18, height 6\' 2"  (1.88 m), weight 97.1 kg, SpO2 93%.       General: awake, alert, appropriate, cognitively appears better this AM; sitting u in bed; in air bed;  NAD HENT: conjugate gaze; oropharynx moist CV: regular  rate- in Afib; no JVD Pulmonary: CTA B/L; no W/R/R- good air movement GI: soft, NT, ND, (+)BS Psychiatric: appropriate but doesn't have insight that was confused last night Neurological: alert   Cervical back: Tenderness present.     Comments: Psoriatic arthritis changes esp in hands    R shoulder has ~ 20-25 degrees of active ROM max RUE- deltoid 2-/5; otherwise 4+/5 in RUE LUE shoulder has >90 degrees and 4+/5 in LUE RLE- HF proximally 3-/5 and sitally 4/5 LLE- HF 2-/5; KE 2/5; and DF/PF 4/5-unchanged 8/18    Assessment/Plan: 1. Functional deficits which require 3+ hours per day of interdisciplinary therapy in a comprehensive inpatient rehab setting. Physiatrist is providing close team supervision and 24 hour management of active medical problems listed below. Physiatrist and rehab  team continue to assess barriers to discharge/monitor patient progress toward functional and medical goals  Care Tool:  Bathing    Body parts bathed by patient: Right arm, Left arm, Left upper leg, Chest, Front perineal area, Abdomen, Face, Buttocks, Right upper leg   Body parts bathed by helper: Right lower leg, Left lower leg     Bathing assist Assist Level: Minimal Assistance - Patient > 75%     Upper Body Dressing/Undressing Upper body dressing   What is the patient wearing?: Pull over shirt    Upper body assist Assist Level: Minimal Assistance - Patient > 75%    Lower Body Dressing/Undressing Lower body dressing            Lower body assist Assist for lower body dressing: Moderate Assistance - Patient 50 - 74%     Toileting Toileting    Toileting assist Assist for toileting: Moderate Assistance - Patient 50 - 74%     Transfers Chair/bed  transfer  Transfers assist     Chair/bed transfer assist level: Contact Guard/Touching assist     Locomotion Ambulation   Ambulation assist   Ambulation activity did not occur: Safety/medical concerns  Assist level: Minimal Assistance - Patient > 75% Assistive device: Walker-rolling Max distance: 37 feet   Walk 10 feet activity   Assist  Walk 10 feet activity did not occur: Safety/medical concerns  Assist level: Minimal Assistance - Patient > 75% Assistive device: Walker-rolling   Walk 50 feet activity   Assist Walk 50 feet with 2 turns activity did not occur: Safety/medical concerns         Walk 150 feet activity   Assist Walk 150 feet activity did not occur: Safety/medical concerns         Walk 10 feet on uneven surface  activity   Assist Walk 10 feet on uneven surfaces activity did not occur: Safety/medical concerns         Wheelchair     Assist Is the patient using a wheelchair?: Yes Type of Wheelchair: Manual    Wheelchair assist level: Dependent - Patient 0%      Wheelchair 50 feet with 2 turns activity    Assist        Assist Level: Dependent - Patient 0%   Wheelchair 150 feet activity     Assist      Assist Level: Dependent - Patient 0%   Blood pressure (!) 153/75, pulse 78, temperature 98 F (36.7 C), temperature source Oral, resp. rate 18, height 6\' 2"  (1.88 m), weight 97.1 kg, SpO2 93%.  Medical Problem List and Plan: 1. Functional deficits secondary to  L intertrochanteric hip fx s/p IM nail WBAT             -patient may  shower             -ELOS/Goals: 10-14 days supervision             D/c 9/6  Con't CIR PT and OT  Confusion from high doses of benadryl pt taking form home dose-   Pt c/o anxiety- will add buspar  D/w team x2 different times and saw pt twice- will also d/c Air mattress since not helpful and add Sarna lotion for itching 2.  Antithrombotics: -DVT/anticoagulation:  Pharmaceutical:  Eliquis             -antiplatelet therapy: Plavix , change protonix to pepcid as this will help efficacy    3. 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   No complaints of pain today, continue current regimen  8/13- said pain not controlled from hour 3-4- but declined Oxycontin for pain-stopped last dose of Nabumetone- since Cr up so much  8/14- won't change pain meds since more confused per staff- pneumonia vs chronic? Pt asking again for q3 hours prn meds- explained will not do this.   8/15- pt agreed to keep pain meds aq4 hours- educated has been a little confused- pt agreed  8/19-8/20-  pt doing better, but wants to add Cosentyx- as below-  4. Mood/Behavior/Sleep: LCSW to evaluate and provide emotional support             -antipsychotic agents: Vilazodone 40 mg daily             -continue Remeron 30 mg q HS             -continue Provigil 100 mg daily   8/16- will increase Vistaril to BID prn   5. Neuropsych/cognition: This patient is capable of making decisions on his own behalf.   6. Skin/Wound Care: Routine skin care checks             -sacral pressure injury; continue local care/pressure relief             -monitor surgical incision             L elbow and R lower leg wounds- local wound care  8-12: Dressing removed and wounds observed as above; no obvious source of infection, but if no other findings would be most likely soft tissue infection from left elbow or hip..  See below. 8/21- will get sutures out of L wrist and L hip is glued.  7.  Fluids/Electrolytes/Nutrition: Routine Is and Os and follow-up chemistries             -continue Mag-ox 400 mg daily   8: Hypertension: monitor TID and prn -Continue Imdur 30 mg daily -continue Demadex 20 mg daily   Vitals:   06/27/23 1922 06/28/23 0327  BP: 129/82 (!) 153/75  Pulse: 62 78  Resp: 16 18  Temp: 98.8 F (37.1 C) 98 F (36.7 C)  SpO2: 99% 93%   Fair control 8/18  8/19-8/20 BP controlled- con't regimen  8/21- BP a little elevated this AM 158 systolic- usually 130s-150s- might benefit from hydrochlorothiazide?  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  8/13- CBGs running 199 to 265 in last 24 hours- since Semglee reduced. Don't feel comfortable adding PO meds since Cr up to 1.78 today  CBG (last 3)  Recent Labs    06/27/23 1634 06/27/23 2042 06/28/23 0628  GLUCAP 97 164* 78  Reduce semglee , cannot use metformin, also allergies preclude glimipride , consider jardiance, took metformin at home may restart if creat 1.2 or better   8/19- CBG's 122 to 249- needs something, but Jardiance cannot use due to hyperkalemia and Metformin not appropriate due to Variable/labile Cr/BUN- Semglee was reduced yesterday- will give 24 hours to follow trend and treat hyperkalemia.   8/20- CBGs 110-233- will try Tradjenta 5 mg daily since not renally dosed  8/21- CBG's looking much better  1120-173  8/22- CBGs 78-164- con't regimen 11: Venous stasis: RLE pre-tibial ulcer; continue local wound care   12: S/p left IM nail Dr. [name]             -weight bearing at tolerated   13: Sleep apnea: CPAP at 17 nightly   14: Left rib fractures: pain control and pulmonary toilet   15: Paroxysmal atrial fib: on Eliquis, Imdur, Demadex (rate controlled no other meds at home)   8/12: EKG with normal ventricular rate, however in atrial flutter, new diagnosis.  Not on rate controlling medications, cardiology consulted for input.. Getting IVF as below. Patient otherwise stable.   8/14- not new per chart- had pAfib- although pt said not aware (poor STM)- con't since rate appears controlled OFF meds- actually running 50s to 90s  8/15- HR in 50s-60s- so not appropriate for rate meds.   8/19- HR running in the  60s 16: BPH: continue Flomax   17:Psoriasis/psoriatic arthritis: his family brought his Desonide 0.05% cream and clobetasol 0.05% cream to apply to hands, elbows, knees daily as needed             -If can get Cosentyx from home, can give to him- 2 weeks late, will make him hurt too much  8/11- educated pt don't have Cosentyx in hospital- will need ot bring form home  8/19- asked pt to have family bring in Cosentyx- think this will help his pain and risks of infection outweighed by benefits of pain reduction  8/20- family to bring in  18: Rosacea: his family brought in his Soolantra (ivermectin) 1% cream to apply to face daily as needed   8/11- ordered by pharmacy today  8/15- meds brought in and sent to pharmacy 19: s/p CABG 2023   20: Osteoarthritis: S/p left shoulder arthroplasty; needs the right shoulder done as well- has R frozen shoulder as a result.    21. 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    8/11- BUN up to 60- very dry- and Cr up some- will give IVFs 60cc/hour  after therapy til tomorrow and recheck labs in AM  8/12: Creatinine still increasing 1.99, along with increased potassium and downtrending sodium; increase fluid rate today to 1 L IV fluid at 75/h, urine studies pending, repeat BMP at 2 PM to ensure no further  worsening -if creatinine increasing or K increasing, will consult nephrology at that time.  May need bicarbonate.  - Urinalysis normal, urine sodium and serum osmolality appear prerenal, awaiting urine osmolality; K responsive to IVF, continue for 1L, Cr/BUN stable will repeat in AM  8/13- Wil give another 12 hours of NS IVFs 100cc/hour- Cr down to 1.78 and BUN still 84-   8/14- Cr 1.45 down from 1.78 and BUN 67 down from 84- will give 1 more 12 hours of IVFs and recheck in AM   8/16- labs came back late- Cr 1.35 and BUN 48- improving- con't to push PO fluids.   8/19- BUN down to 34 and Cr 1.24- much improved- con't to monitor weekly. Will give Lokelma for K+ 5.2- will recheck in AM  8/20- Got Lokelma but K+ up further to 5.3- spoke with pharmacy- will give Lokelma 10 G BID x 2 doses and give IVF NS- 75cc/hour x 16 hours to start  at 3pm- and recheck in AM- if K+ isn't down in AM, will call renal.   8/21- K+ 5.3 even after lokelma BID- we discussed that wait and recheck in AM- they aren't worried until >5.5- which makes sense- will recheck in AM and go from there- might benefit from hydrochlorothiazide? Cr down to 1.18 and BUN still 41- pt drinking 2 cups/day of water- encouraged to drink 6-8 cups/day.   8/22- Cr down to 1.06 but BUN stable at 41- K+ 5.2 again- will not keep intervening since stable off Lokelma-  22. - Walked, but has Spina bifida with frequent falls- will d/w therapy.    23. Leukocytosis - resolved now on last day of abx   8/11- afebrile- feel good- will recheck in AM- if doesn't improve, will check U/A and Cx and CXR- having nurse check wounds as well- just to make sure-   -8-12: WBCs continue uptrending, 17.5 today, along with  thrombocytosis and anemia.  Ordered urinalysis with reflex to culture, chest x-ray, initiate IV fluids as above.   Urinalysis within normal limits   Chest x-ray pending - RLL infiltrate, significant from last CXR, concerning for aspiration pneumonia. Start IV Zosyn 4.75 mg Q8H for 5 days, SLP consult placed for swallow eval. Labs in AM    8/13- WBC down to 10.7 from 17.5- doing much better- con't Zosyn for pneumonia- asked pt to use ICS q2 hours while awake.   Completed Zosyn afebrile  24. Constipation  8/15- Will give Sorbitol 30cc- if no large BM by then  8/16- Large BM yesterday AM before Sorbitol 8/19- LBM 2 days ago- is hard/feels constipated- will change Colace and Senna to Senna-S and increase to 2 tabs BID  8/22- LBM last night- large- greenish brown- not black 25. Anemia  8/15- has dropped, however at the time it was higher, had been hemo-concentrated- and is now doing better- will monitor closely.   8/16- Hb back up to 8.8- doing better  8/22- Last Hb 8.7- will monitor 26. Anal fissure  8/16- will order/d/w nursing meds from home for anal fissure- since we have in hospital.  27. Osteoporosis-   8/19- pt reports going ot start Fosamax, even before had fx- so needs it! D/w pt today and need for getting it started as soon as leaves.  28. Black stools  8/21- ordered hemoccult- hasn't had another BM yet. Also on Iron TID which could play a part- of note, Hb 8.7- was 8.8-9/1- but also is more hydrated- will recheck in AM-  29. Confusion/overtaking benadryl  8/22- got loopy and confused last night- taking benadryl 50 or more mg at a time including vistaril already ordered due to anxiety- will stop Benadryl- con't vistaril for now- educated pt cannot take home meds 30. Anxiety  8/22- will add Buspar 5 mg TID for anxiety and can increase as required- no more benadryl!  I spent a total of  59  minutes on total care today- >50% coordination of care- due to  Seen patient twice since sounds  like confused overnight- was taking home benadryl for anxiety- took 50 mg!!! And hiding from nursing. Also spoke with nursing x2 about issues then plan.   LOS: 12 days A FACE TO FACE EVALUATION WAS PERFORMED  Siarah Deleo 06/28/2023, 8:43 AM    Addendum:

## 2023-06-29 LAB — GLUCOSE, CAPILLARY
Glucose-Capillary: 99 mg/dL (ref 70–99)
Glucose-Capillary: 126 mg/dL — ABNORMAL HIGH (ref 70–99)
Glucose-Capillary: 108 mg/dL — ABNORMAL HIGH (ref 70–99)
Glucose-Capillary: 110 mg/dL — ABNORMAL HIGH (ref 70–99)

## 2023-06-29 MED ORDER — POLYETHYLENE GLYCOL 3350 17 G PO PACK
17.0000 g | PACK | Freq: Every day | ORAL | Status: DC
  Administered 2023-06-29: 17 g via ORAL
  Filled 2023-06-29 (×2): qty 1

## 2023-06-29 NOTE — Progress Notes (Signed)
Physical Therapy Session Note  Patient Details  Name: Dylan Bruce MRN: [id number] Date of Birth: Jun 04, 1946  Today's Date: 06/29/2023 PT Individual Time: 1305-1350 PT Individual Time Calculation (min): 45 min   Short Term Goals: Week 2:  PT Short Term Goal 1 (Week 2): Pt will require min A with bed mobility PT Short Term Goal 2 (Week 2): Pt will require min A with sit to stand transfers with LRAD PT Short Term Goal 3 (Week 2): Pt will require min A with gait x 10 ft with LRAD  Skilled Therapeutic Interventions/Progress Updates: Pt presented in TIS at sink with NT present agreeable to therapy. Pt states unrated pain at hip, rest and repositioning provided as needed during session. Discussed with pt use of shoes for ambulation with pt willing to try. PTA donned socks/shoes total A for time management. Pt transported to day room for energy conservation. Pt then ambulated ~68 ft with RW and chair follow. Pt noted to ambulate with uneven step length BUT was able to step with overall step through pattern. After extended seated rest pt worked on toe taps to target specifically with RLE to increase tolerance of weight bearing on LLE x 8. Pt encouraged to decrease pressure on BUE with noted shortened step and difficulty managing to hit target. Pt then transported to nsg station and pt ambulated an additional 65Ft with RW and chair follow. Pt noted to have improved nearing equal step length approx 75% of time. Pt then transferred to recliner and positioned to comfort. Pt left in recliner at end of session with belt alarm on, call bell within reach and needs met.      Therapy Documentation Precautions:  Precautions Precautions: Fall Restrictions Weight Bearing Restrictions: Yes LLE Weight Bearing: Weight bearing as tolerated General:   Vital Signs: Therapy Vitals Temp: 99.3 F (37.4 C) Pulse Rate: 75 Resp: 16 BP: 102/71 Patient Position (if appropriate): Sitting Oxygen Therapy SpO2: 98  % O2 Device: Room Air Pain:   Mobility:   Locomotion :    Trunk/Postural Assessment :    Balance:   Exercises:   Other Treatments:      Therapy/Group: Individual Therapy  [name] 06/29/2023, 4:04 PM

## 2023-06-29 NOTE — Progress Notes (Signed)
PROGRESS NOTE   Subjective/Complaints:   Pt reports stools really hard- hard ot push out.  Buspar very helpful for anxiety.  Per nursing soaked bed last night- doesn't remember doing it.  Still on IVF-s will stop  ROS:  Pt denies SOB, abd pain, CP, N/V/ (+)C/D, and vision changes    Except for HPI  Objective:   No results found. Recent Labs    06/27/23 0533  WBC 8.7  HGB 8.7*  HCT 28.7*  PLT 550*   Recent Labs    06/27/23 0533 06/28/23 0531  NA 135 136  K 5.3* 5.2*  CL 106 106  CO2 22 21*  GLUCOSE 198* 95  BUN 41* 41*  CREATININE 1.18 1.06  CALCIUM 8.9 9.1    Intake/Output Summary (Last 24 hours) at 06/29/2023 0910 Last data filed at 06/29/2023 0813 Gross per 24 hour  Intake 1853.47 ml  Output 1250 ml  Net 603.47 ml     Pressure Injury 06/16/23 Sacrum Medial Stage 2 -  Partial thickness loss of dermis presenting as a shallow open injury with a red, pink wound bed without slough. small open area on coccyx in middle of gluteal cleft 1x1 cm (Active)  06/16/23 1848  Location: Sacrum  Location Orientation: Medial  Staging: Stage 2 -  Partial thickness loss of dermis presenting as a shallow open injury with a red, pink wound bed without slough.  Wound Description (Comments): small open area on coccyx in middle of gluteal cleft 1x1 cm  Present on Admission: Yes    Physical Exam: Vital Signs Blood pressure (!) 145/59, pulse 61, temperature 98.4 F (36.9 C), resp. rate 16, height 6\' 2"  (1.88 m), weight 96.9 kg, SpO2 98%.       General: awake, alert, appropriate, NAD HENT: conjugate gaze; oropharynx moist CV: regular rate- in Afib; no JVD Pulmonary: CTA B/L; no W/R/R- good air movement GI: soft, NT, ND, (+)BS Psychiatric: appropriate Neurological: alert- not ocnfused this AM   Cervical back: Tenderness present.     Comments: Psoriatic arthritis changes esp in hands    R shoulder has ~ 20-25  degrees of active ROM max RUE- deltoid 2-/5; otherwise 4+/5 in RUE LUE shoulder has >90 degrees and 4+/5 in LUE RLE- HF proximally 3-/5 and sitally 4/5 LLE- HF 2-/5; KE 2/5; and DF/PF 4/5-unchanged 8/18    Assessment/Plan: 1. Functional deficits which require 3+ hours per day of interdisciplinary therapy in a comprehensive inpatient rehab setting. Physiatrist is providing close team supervision and 24 hour management of active medical problems listed below. Physiatrist and rehab team continue to assess barriers to discharge/monitor patient progress toward functional and medical goals  Care Tool:  Bathing    Body parts bathed by patient: Right arm, Left arm, Left upper leg, Chest, Front perineal area, Abdomen, Face, Buttocks, Right upper leg   Body parts bathed by helper: Right lower leg, Left lower leg     Bathing assist Assist Level: Minimal Assistance - Patient > 75%     Upper Body Dressing/Undressing Upper body dressing   What is the patient wearing?: Pull over shirt    Upper body assist Assist Level: Minimal Assistance - Patient >  75%    Lower Body Dressing/Undressing Lower body dressing            Lower body assist Assist for lower body dressing: Moderate Assistance - Patient 50 - 74%     Toileting Toileting    Toileting assist Assist for toileting: Moderate Assistance - Patient 50 - 74%     Transfers Chair/bed transfer  Transfers assist     Chair/bed transfer assist level: Contact Guard/Touching assist     Locomotion Ambulation   Ambulation assist   Ambulation activity did not occur: Safety/medical concerns  Assist level: Contact Guard/Touching assist Assistive device: Walker-rolling Max distance: 100 ft   Walk 10 feet activity   Assist  Walk 10 feet activity did not occur: Safety/medical concerns  Assist level: Contact Guard/Touching assist Assistive device: Walker-rolling   Walk 50 feet activity   Assist Walk 50 feet with 2 turns  activity did not occur: Safety/medical concerns  Assist level: Contact Guard/Touching assist Assistive device: Walker-rolling    Walk 150 feet activity   Assist Walk 150 feet activity did not occur: Safety/medical concerns         Walk 10 feet on uneven surface  activity   Assist Walk 10 feet on uneven surfaces activity did not occur: Safety/medical concerns         Wheelchair     Assist Is the patient using a wheelchair?: Yes Type of Wheelchair: Manual    Wheelchair assist level: Dependent - Patient 0%      Wheelchair 50 feet with 2 turns activity    Assist        Assist Level: Dependent - Patient 0%   Wheelchair 150 feet activity     Assist      Assist Level: Dependent - Patient 0%   Blood pressure (!) 145/59, pulse 61, temperature 98.4 F (36.9 C), resp. rate 16, height 6\' 2"  (1.88 m), weight 96.9 kg, SpO2 98%.  Medical Problem List and Plan: 1. Functional deficits secondary to  L intertrochanteric hip fx s/p IM nail WBAT             -patient may  shower             -ELOS/Goals: 10-14 days supervision             D/c 9/6  Con't CIR_ PT and OT  Confusion from high doses of benadryl pt taking form home dose-   Pt c/o anxiety- will add buspar  D/w team x2 different times and saw pt twice- will also d/c Air mattress since not helpful and add Sarna lotion for itching 2.  Antithrombotics: -DVT/anticoagulation:  Pharmaceutical: Eliquis             -antiplatelet therapy: Plavix , change protonix to pepcid as this will help efficacy    3. 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   No complaints of pain today, continue current regimen  8/13- said pain not controlled from hour 3-4- but declined Oxycontin for pain-stopped last dose of Nabumetone- since Cr up so much  8/14- won't change pain meds since more confused per staff- pneumonia vs chronic? Pt asking again for q3 hours prn meds- explained will not do this.   8/15- pt agreed to keep pain meds aq4 hours- educated has been a little confused- pt agreed  8/19-8/20-  pt doing better, but wants to add Cosentyx- as below-   8/23- Cosentyx to be brought in- if so, have nursing send to pharmacy 4. Mood/Behavior/Sleep: LCSW to evaluate and provide emotional support             -antipsychotic agents: Vilazodone 40 mg daily             -continue Remeron 30 mg q HS             -continue Provigil 100 mg daily   8/16- will increase Vistaril to BID prn   5. Neuropsych/cognition: This patient is capable of making decisions on his own behalf.   6. Skin/Wound Care: Routine skin care checks             -sacral pressure injury; continue local care/pressure relief             -monitor surgical incision             L elbow and R lower leg wounds- local wound care  8-12: Dressing removed and wounds observed as above; no obvious source of infection, but if no other findings would be most likely soft tissue infection from left elbow or hip..  See below. 8/21- will get sutures out of L wrist and L hip is glued.  7. Fluids/Electrolytes/Nutrition: Routine Is and Os and follow-up chemistries             -continue Mag-ox 400 mg daily   8: Hypertension: monitor TID and prn -Continue Imdur 30 mg daily -continue Demadex 20 mg daily   Vitals:   06/29/23 0550 06/29/23 0847  BP: (!) 145/59   Pulse: 61   Resp: 18 16  Temp: 98.4 F (36.9 C)   SpO2: 100% 98%   Fair control 8/18  8/19-8/20 BP controlled- con't regimen  8/21- BP a little elevated this AM 158  systolic- usually 130s-150s- might benefit from hydrochlorothiazide?  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  8/13- CBGs running 199 to 265 in last 24 hours- since Semglee reduced. Don't feel comfortable adding PO meds since Cr up to 1.78 today  CBG (last 3)  Recent Labs  06/28/23 2008 06/28/23 2113 06/29/23 0616  GLUCAP 170* 130* 99  Reduce semglee , cannot use metformin, also allergies preclude glimipride , consider jardiance, took metformin at home may restart if creat 1.2 or better   8/19- CBG's 122 to 249- needs something, but Jardiance cannot use due to hyperkalemia and Metformin not appropriate due to Variable/labile Cr/BUN- Semglee was reduced yesterday- will give 24 hours to follow trend and treat hyperkalemia.   8/20- CBGs 110-233- will try Tradjenta 5 mg daily since not renally dosed  8/23- CBGs doing well- con't regimen- might need ot change Tradjenta due to cost at d/c.  11: Venous stasis: RLE pre-tibial ulcer; continue local wound care   12: S/p left IM nail Dr. [name]             -weight bearing at tolerated   13: Sleep apnea: CPAP at 17 nightly   14: Left rib fractures: pain control and pulmonary toilet   15: Paroxysmal atrial fib: on Eliquis, Imdur, Demadex (rate controlled no other meds at home)   8/12: EKG with normal  ventricular rate, however in atrial flutter, new diagnosis.  Not on rate controlling medications, cardiology consulted for input.. Getting IVF as below. Patient otherwise stable.   8/14- not new per chart- had pAfib- although pt said not aware (poor STM)- con't since rate appears controlled OFF meds- actually running 50s to 90s  8/15- HR in 50s-60s- so not appropriate for rate meds.   8/19- HR running in the  60s 16: BPH: continue Flomax   17:Psoriasis/psoriatic arthritis: his family brought his Desonide 0.05% cream and clobetasol 0.05% cream to apply to hands, elbows, knees daily as needed             -If can get Cosentyx from home, can give to him- 2 weeks late, will make him hurt too much  8/11- educated pt don't have Cosentyx in hospital- will need ot bring form home  8/19- asked pt to have family bring in Cosentyx- think this will help his pain and risks of infection outweighed by benefits of pain reduction  8/20- family to bring in   8/23- says it's on order and should be here soon 18: Rosacea: his family brought in his Soolantra (ivermectin) 1% cream to apply to face daily as needed   8/11- ordered by pharmacy today  8/15- meds brought in and sent to pharmacy 19: s/p CABG 2023   20: Osteoarthritis: S/p left shoulder arthroplasty; needs the right shoulder done as well- has R frozen shoulder as a result.    21. 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    8/11- BUN up to 60- very dry- and Cr up some- will give IVFs 60cc/hour after therapy til tomorrow and recheck labs in AM  8/12: Creatinine still increasing 1.99, along with increased potassium and downtrending sodium; increase fluid rate today to 1 L IV fluid at 75/h, urine studies pending, repeat BMP at 2 PM to ensure no further worsening -if creatinine increasing or K increasing, will consult nephrology at that time.  May need  bicarbonate.  - Urinalysis normal, urine sodium and serum osmolality appear prerenal, awaiting urine osmolality; K responsive to IVF, continue for 1L, Cr/BUN stable will repeat in AM  8/13- Wil give another 12 hours of NS IVFs 100cc/hour- Cr down to 1.78 and BUN still 84-   8/14- Cr 1.45 down from 1.78 and BUN  67 down from 84- will give 1 more 12 hours of IVFs and recheck in AM   8/16- labs came back late- Cr 1.35 and BUN 48- improving- con't to push PO fluids.   8/19- BUN down to 34 and Cr 1.24- much improved- con't to monitor weekly. Will give Lokelma for K+ 5.2- will recheck in AM  8/20- Got Lokelma but K+ up further to 5.3- spoke with pharmacy- will give Lokelma 10 G BID x 2 doses and give IVF NS- 75cc/hour x 16 hours to start  at 3pm- and recheck in AM- if K+ isn't down in AM, will call renal.   8/21- K+ 5.3 even after lokelma BID- we discussed that wait and recheck in AM- they aren't worried until >5.5- which makes sense- will recheck in AM and go from there- might benefit from hydrochlorothiazide? Cr down to 1.18 and BUN still 41- pt drinking 2 cups/day of water- encouraged to drink 6-8 cups/day.   8/22- Cr down to 1.06 but BUN stable at 41- K+ 5.2 again- will not keep intervening since stable off Lokelma-   8/23- Stopped IVFs and recheck labs Monday  22. - Walked, but has Spina bifida with frequent falls- will d/w therapy.    23. Leukocytosis - resolved now on last day of abx   8/11- afebrile- feel good- will recheck in AM- if doesn't improve, will check U/A and Cx and CXR- having nurse check wounds as well- just to make sure-   -8-12: WBCs continue uptrending, 17.5 today, along with thrombocytosis and anemia.  Ordered urinalysis with reflex to culture, chest x-ray, initiate IV fluids as above.   Urinalysis within normal limits   Chest x-ray pending - RLL infiltrate, significant from last CXR, concerning for aspiration pneumonia. Start IV Zosyn 4.75 mg Q8H for 5 days, SLP consult placed for  swallow eval. Labs in AM    8/13- WBC down to 10.7 from 17.5- doing much better- con't Zosyn for pneumonia- asked pt to use ICS q2 hours while awake.   Completed Zosyn afebrile  24. Constipation  8/15- Will give Sorbitol 30cc- if no large BM by then  8/16- Large BM yesterday AM before Sorbitol 8/19- LBM 2 days ago- is hard/feels constipated- will change Colace and Senna to Senna-S and increase to 2 tabs BID  8/22- LBM last night- large- greenish brown- not black 8/23-LBM yesterday but very hard stools- will add Miralax daily not just prn- since already on Senna-S 2 tabs BID- likely from iron 25. Anemia  8/15- has dropped, however at the time it was higher, had been hemo-concentrated- and is now doing better- will monitor closely.   8/16- Hb back up to 8.8- doing better  8/22- Last Hb 8.7- will monitor 26. Anal fissure  8/16- will order/d/w nursing meds from home for anal fissure- since we have in hospital.  27. Osteoporosis-   8/19- pt reports going ot start Fosamax, even before had fx- so needs it! D/w pt today and need for getting it started as soon as leaves.  28. Black stools  8/21- ordered hemoccult- hasn't had another BM yet. Also on Iron TID which could play a part- of note, Hb 8.7- was 8.8-9/1- but also is more hydrated- will recheck in AM-   8/23- no more black stools- will recheck Monday 29. Confusion/overtaking benadryl  8/22- got loopy and confused last night- taking benadryl 50 or more mg at a time including vistaril already ordered due to anxiety- will stop Benadryl- con't vistaril  for now- educated pt cannot take home meds 30. Anxiety  8/22- will add Buspar 5 mg TID for anxiety and can increase as required- no more benadryl!  8/23- pt reports anxiety is doing somewhat better with Buspar- likes it a lot.    I spent a total of  41  minutes on total care today- >50% coordination of care- due to   D/w nursing about overnight- review of vitals/and IVFs/orders- and review of Bps  and treatment  LOS: 13 days A FACE TO FACE EVALUATION WAS PERFORMED  Lilley Hubble 06/29/2023, 9:10 AM    Addendum:

## 2023-06-29 NOTE — Progress Notes (Signed)
Occupational Therapy Session Note  Patient Details  Name: Dylan Bruce MRN: [id number] Date of Birth: [birth year]  Today's Date: 06/29/2023 OT Individual Time: 6962-9528 OT Individual Time Calculation (min): 75 min    Short Term Goals: Week 2:  OT Short Term Goal 1 (Week 2): Pt will complete LB dressing with Min A with AE as necessary OT Short Term Goal 2 (Week 2): Pt will complete grooming in standing with Min A OT Short Term Goal 3 (Week 2): Pt will complete footwear Max A  Skilled Therapeutic Interventions/Progress Updates:    Pt received supine with 4/10 pain in his L hip, premedicated, agreeable to OT session. No further request for pain intervention other than rest breaks during session. He was able to come to EOB with (S) with extra time required for maneuvering LLE but no use of gait belt or OT assist needed. Discussed benefits of a bed rail at home. Pants donned EOB with mod A to thread over BLE. He completed sit > stand with CGA from elevated EOB with RW use. He completed 75 ft of functional mobility with the RW with slow, step-to gait pattern- performed to improve functional activity tolerance and dynamic standing balance for carryover to ADL transfers. He required one seated rest break where RW was adjusted down one level d/t discomfort in shoulders. He finished with another 50 ft before requiring an extended rest break. CGA- (S) overall for mobility. Pt completed blocked practice sit <> stand, 2x3 repetitions with no UE support, to challenge generalized strengthening for ADL transfers, as well as increasing functional activity tolerance and cardiorespiratory endurance. Pt required CGA initially progressing to min A. Provided his RUE passive stretch into 90 degrees flexion and abduction. He began scratching his L elbow where he has a wound from a previous fall- it began bleeding and it was reinforced with foam bandage. He returned to his room via 10 ft of functional mobility, very slow  pace with the RW, CGA- (S) overall, before fatiguing and needing to use the wc the remainder of the way. He completed a stand pivot transfer with the RW to the recliner with min A, requiring min cueing for sequencing.  Pt was left sitting up in the recliner with all needs met, chair alarm set, and call bell within reach. RT present for breathing tx.    Therapy Documentation Precautions:  Precautions Precautions: Fall Restrictions Weight Bearing Restrictions: Yes LLE Weight Bearing: Weight bearing as tolerated  Therapy/Group: Individual Therapy  Crissie Reese 06/29/2023, 6:25 AM

## 2023-06-29 NOTE — Progress Notes (Signed)
Occupational Therapy Session Note  Patient Details  Name: HORACE ROBARDS MRN: [id number] Date of Birth: [birth year]  {CHL IP REHAB OT TIME CALCULATIONS:[id number]}   Short Term Goals: Week 2:  OT Short Term Goal 1 (Week 2): Pt will complete LB dressing with Min A with AE as necessary OT Short Term Goal 2 (Week 2): Pt will complete grooming in standing with Min A OT Short Term Goal 3 (Week 2): Pt will complete footwear Max A  Skilled Therapeutic Interventions/Progress Updates:  Pt received *** for skilled OT session with focus on ***. Pt agreeable to interventions, demonstrating overall *** mood. Pt reported ***/10 pain, stating "***" in reference to ***. OT offering intermediate rest breaks and positioning suggestions throughout session to address pain/fatigue and maximize participation/safety in session.    Pt remained *** with all immediate needs met at end of session. Pt continues to be appropriate for skilled OT intervention to promote further functional independence.   Therapy Documentation Precautions:  Precautions Precautions: Fall Restrictions Weight Bearing Restrictions: Yes LLE Weight Bearing: Weight bearing as tolerated   Therapy/Group: Individual Therapy  Lou Cal, OTR/L, MSOT  06/29/2023, 3:17 PM

## 2023-06-30 DIAGNOSIS — S72142D Displaced intertrochanteric fracture of left femur, subsequent encounter for closed fracture with routine healing: Principal | ICD-10-CM

## 2023-06-30 DIAGNOSIS — F411 Generalized anxiety disorder: Secondary | ICD-10-CM

## 2023-06-30 DIAGNOSIS — Z794 Long term (current) use of insulin: Secondary | ICD-10-CM

## 2023-06-30 DIAGNOSIS — K5901 Slow transit constipation: Secondary | ICD-10-CM

## 2023-06-30 DIAGNOSIS — E119 Type 2 diabetes mellitus without complications: Secondary | ICD-10-CM

## 2023-06-30 LAB — GLUCOSE, CAPILLARY
Glucose-Capillary: 112 mg/dL — ABNORMAL HIGH (ref 70–99)
Glucose-Capillary: 154 mg/dL — ABNORMAL HIGH (ref 70–99)
Glucose-Capillary: 113 mg/dL — ABNORMAL HIGH (ref 70–99)
Glucose-Capillary: 64 mg/dL — ABNORMAL LOW (ref 70–99)
Glucose-Capillary: 66 mg/dL — ABNORMAL LOW (ref 70–99)
Glucose-Capillary: 147 mg/dL — ABNORMAL HIGH (ref 70–99)
Glucose-Capillary: 209 mg/dL — ABNORMAL HIGH (ref 70–99)

## 2023-06-30 MED ORDER — POLYETHYLENE GLYCOL 3350 17 G PO PACK
17.0000 g | PACK | Freq: Two times a day (BID) | ORAL | Status: DC
  Administered 2023-06-30 – 2023-07-10 (×19): 17 g via ORAL
  Filled 2023-06-30 (×21): qty 1

## 2023-06-30 MED ORDER — HYDROCORTISONE ACETATE 25 MG RE SUPP
25.0000 mg | Freq: Two times a day (BID) | RECTAL | Status: DC
  Administered 2023-06-30 – 2023-07-03 (×3): 25 mg via RECTAL
  Filled 2023-06-30 (×8): qty 1

## 2023-06-30 NOTE — Progress Notes (Signed)
PROGRESS NOTE   Subjective/Complaints:   Still c/o hard stool. Able to move bowels but they're like rocks. Left hip pain tolerable with meds  ROS: Patient denies fever, rash, sore throat, blurred vision, dizziness, nausea, vomiting, diarrhea, cough, shortness of breath or chest pain,  back/neck pain, headache, or mood change.    Objective:   No results found. No results for input(s): "WBC", "HGB", "HCT", "PLT" in the last 72 hours.  Recent Labs    06/28/23 0531  NA 136  K 5.2*  CL 106  CO2 21*  GLUCOSE 95  BUN 41*  CREATININE 1.06  CALCIUM 9.1    Intake/Output Summary (Last 24 hours) at 06/30/2023 0803 Last data filed at 06/30/2023 0745 Gross per 24 hour  Intake 1080 ml  Output 3100 ml  Net -2020 ml     Pressure Injury 06/16/23 Sacrum Medial Stage 2 -  Partial thickness loss of dermis presenting as a shallow open injury with a red, pink wound bed without slough. small open area on coccyx in middle of gluteal cleft 1x1 cm (Active)  06/16/23 1848  Location: Sacrum  Location Orientation: Medial  Staging: Stage 2 -  Partial thickness loss of dermis presenting as a shallow open injury with a red, pink wound bed without slough.  Wound Description (Comments): small open area on coccyx in middle of gluteal cleft 1x1 cm  Present on Admission: Yes    Physical Exam: Vital Signs Blood pressure (!) 153/72, pulse 74, temperature 99.1 F (37.3 C), resp. rate 16, height 6\' 2"  (1.88 m), weight 96.8 kg, SpO2 97%.       Constitutional: No distress . Vital signs reviewed. HEENT: NCAT, EOMI, oral membranes moist Neck: supple Cardiovascular: RRR without murmur. No JVD    Respiratory/Chest: CTA Bilaterally without wheezes or rales. Normal effort    GI/Abdomen: BS +, non-tender, non-distended Ext: no clubbing, cyanosis, or edema Psych: pleasant and cooperative  Neurological: Alert and oriented x 3. Normal insight and  awareness. Intact Memory. Normal language and speech. Cranial nerve exam unremarkable.     Cervical back: Tenderness present.     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 Functional deficits which require 3+ hours per day of interdisciplinary therapy in a comprehensive inpatient rehab setting. Physiatrist is providing close team supervision and 24 hour management of active medical problems listed below. Physiatrist and rehab team continue to assess barriers to discharge/monitor patient progress toward functional and medical goals  Care Tool:  Bathing    Body parts bathed by patient: Right arm, Left arm, Left upper leg, Chest, Front perineal area, Abdomen, Face, Buttocks, Right upper leg   Body parts bathed by helper: Right lower leg, Left lower leg     Bathing assist Assist Level: Minimal Assistance - Patient > 75%     Upper Body Dressing/Undressing Upper body dressing   What is the patient wearing?: Pull over shirt    Upper body assist Assist Level:  Minimal Assistance - Patient > 75%    Lower Body Dressing/Undressing Lower body dressing            Lower body assist Assist for lower body dressing: Moderate Assistance - Patient 50 - 74%     Toileting Toileting    Toileting assist Assist for toileting: Moderate Assistance - Patient 50 - 74%     Transfers Chair/bed transfer  Transfers assist     Chair/bed transfer assist level: Contact Guard/Touching assist     Locomotion Ambulation   Ambulation assist   Ambulation activity did not occur: Safety/medical concerns  Assist level: Contact Guard/Touching assist Assistive device: Walker-rolling Max distance: 100 ft   Walk 10 feet activity   Assist  Walk 10 feet activity did not occur:  Safety/medical concerns  Assist level: Contact Guard/Touching assist Assistive device: Walker-rolling   Walk 50 feet activity   Assist Walk 50 feet with 2 turns activity did not occur: Safety/medical concerns  Assist level: Contact Guard/Touching assist Assistive device: Walker-rolling    Walk 150 feet activity   Assist Walk 150 feet activity did not occur: Safety/medical concerns         Walk 10 feet on uneven surface  activity   Assist Walk 10 feet on uneven surfaces activity did not occur: Safety/medical concerns         Wheelchair     Assist Is the patient using a wheelchair?: Yes Type of Wheelchair: Manual    Wheelchair assist level: Dependent - Patient 0%      Wheelchair 50 feet with 2 turns activity    Assist        Assist Level: Dependent - Patient 0%   Wheelchair 150 feet activity     Assist      Assist Level: Dependent - Patient 0%   Blood pressure (!) 153/72, pulse 74, temperature 99.1 F (37.3 C), resp. rate 16, height 6\' 2"  (1.88 m), weight 96.8 kg, SpO2 97%.  Medical Problem List and Plan: 1. Functional deficits secondary to  L intertrochanteric hip fx s/p IM nail WBAT             -patient may  shower             -ELOS/Goals: 10-14 days supervision             D/c 9/6     Confusion from high doses of benadryl pt taking form home dose-   Pt c/o anxiety- will add buspar  -Continue CIR therapies including PT, OT  2.  Antithrombotics: -DVT/anticoagulation:  Pharmaceutical: Eliquis             -antiplatelet therapy: Plavix , change protonix to pepcid as this will help efficacy    3. 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   No complaints of pain today, continue current  regimen  8/13- said pain not controlled from hour 3-4- but declined Oxycontin for pain-stopped last dose of Nabumetone- since Cr up so much  8/14- won't change pain meds since more confused per staff- pneumonia vs chronic? Pt asking again for q3 hours prn meds- explained will not do this.   8/15- pt agreed to keep pain meds aq4 hours- educated has been a little confused- pt agreed  8/19-8/20-  pt doing better, but wants to add Cosentyx- as below-   8/24- Cosentyx to be brought in from home--still waiting 4. Mood/Behavior/Sleep: LCSW to evaluate and provide emotional support             -antipsychotic agents: Vilazodone 40 mg daily             -continue Remeron 30 mg q HS             -continue Provigil 100 mg daily   8/16- will increase Vistaril to BID prn   5. Neuropsych/cognition: This patient is capable of making decisions on his own behalf.   6. Skin/Wound Care: Routine skin care checks             -sacral pressure injury; continue local care/pressure relief             -monitor surgical incision             L elbow and R lower leg wounds- sutures out, glued 8/21- will get sutures out of L wrist and L hip is glued.  7. Fluids/Electrolytes/Nutrition: Routine Is and Os and follow-up chemistries             -continue Mag-ox 400 mg daily   8: Hypertension: monitor TID and prn -Continue Imdur 30 mg daily -continue Demadex 20 mg daily   Vitals:   06/30/23 0348 06/30/23 0757  BP: (!) 153/72   Pulse: 69 74  Resp: 19 16  Temp: 99.1 F (37.3 C)   SpO2: 98% 97%   Fair control 8/18  8/19-8/20 BP controlled- con't regimen  8/21- BP a little elevated this AM 158 systolic- usually 130s-150s- might benefit from hydrochlorothiazide?  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  8/13- CBGs running 199 to 265 in last 24 hours- since Semglee reduced. Don't feel comfortable adding PO meds since Cr up to 1.78 today  CBG (last 3)  Recent Labs    06/29/23 2015 06/30/23 0346 06/30/23 0544  GLUCAP 110* 112* 154*  Reduce semglee , cannot use metformin, also allergies preclude glimipride , consider jardiance, took metformin at home may restart if creat 1.2 or better   8/19- CBG's 122 to 249- needs something, but Jardiance cannot use due to hyperkalemia and  Metformin not appropriate due to Variable/labile Cr/BUN- Semglee was reduced yesterday- will give 24 hours to follow trend and treat hyperkalemia.   8/20- CBGs 110-233- will try Tradjenta 5 mg daily since not renally dosed  8/24-well controlled. might need ot change Tradjenta due to cost at d/c.  11: Venous stasis: RLE pre-tibial ulcer; continue local wound care   12: S/p left IM nail Dr. [name]             -weight bearing at tolerated   13: Sleep apnea: CPAP at 17 nightly   14: Left rib fractures: pain control and pulmonary toilet   15: Paroxysmal atrial fib: on Eliquis, Imdur, Demadex (rate controlled no other meds at home)   8/12: EKG with normal ventricular rate, however in atrial flutter, new diagnosis.  Not on rate controlling medications, cardiology consulted for input.. Getting  IVF as below. Patient otherwise stable.   8/14- not new per chart- had pAfib- although pt said not aware (poor STM)- con't since rate appears controlled OFF meds- actually running 50s to 90s  8/15- HR in 50s-60s- so not appropriate for rate meds.   8/19- HR running in the  60s 16: BPH: continue Flomax   17:Psoriasis/psoriatic arthritis: his family brought his Desonide 0.05% cream and clobetasol 0.05% cream to apply to hands, elbows, knees daily as needed             -If can get Cosentyx from home, can give to him- 2 weeks late, will make him hurt too much  8/11- educated pt don't have Cosentyx in hospital- will need ot bring form home  8/19- asked pt to have family bring in Cosentyx- think this will help his pain and risks of infection outweighed by benefits of pain reduction  8/20- family to bring in   8/23-  ? should be here soon 18: Rosacea: his family brought in his Soolantra (ivermectin) 1% cream to apply to face daily as needed   8/11- ordered by pharmacy today  8/15- meds brought in and sent to pharmacy 19: s/p CABG 2023   20: Osteoarthritis: S/p left shoulder arthroplasty; needs the right shoulder done as well- has R frozen shoulder as a result.    21. 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    8/11- BUN up to 60- very dry- and Cr up some- will give IVFs 60cc/hour after therapy til tomorrow and recheck labs in AM  8/12: Creatinine still increasing 1.99, along with increased potassium and downtrending sodium; increase fluid rate today to 1 L IV fluid at 75/h, urine studies pending, repeat BMP at 2 PM to ensure no further worsening -if creatinine increasing or K increasing, will consult nephrology at that time.  May need bicarbonate.  - Urinalysis normal, urine sodium and serum osmolality appear prerenal, awaiting urine osmolality; K responsive to IVF, continue for 1L, Cr/BUN  stable will repeat in AM  8/13- Wil give another 12 hours of NS IVFs 100cc/hour- Cr down to 1.78 and BUN still 84-   8/14- Cr 1.45 down from 1.78 and BUN 67 down from 84- will give 1 more 12 hours of IVFs and recheck in AM   8/16- labs came back late- Cr 1.35 and BUN 48- improving- con't to push PO fluids.   8/19- BUN down to 34 and Cr 1.24- much improved- con't to monitor weekly. Will give Lokelma for K+ 5.2- will recheck in AM  8/20- Got Lokelma but K+ up further to 5.3- spoke with pharmacy- will give Lokelma 10 G BID x 2 doses and give IVF NS- 75cc/hour x 16 hours to start  at 3pm- and recheck in AM- if K+ isn't down in AM, will call renal.   8/21- K+ 5.3 even after lokelma BID- we discussed that wait and recheck in AM- they aren't worried until >5.5- which makes sense- will recheck in AM and go from there- might benefit from hydrochlorothiazide? Cr down to 1.18 and BUN still 41- pt drinking 2 cups/day of water- encouraged to drink 6-8 cups/day.   8/22- Cr down to 1.06 but BUN stable at 41- K+ 5.2 again- will not keep intervening since stable off Lokelma-   8/24 off IVFs and recheck labs Monday  22. - Walked, but has Spina bifida with frequent falls- will d/w therapy.    23. Leukocytosis - resolved now on last day of abx   8/11- afebrile- feel good- will recheck in AM- if doesn't improve, will check U/A and Cx and CXR- having nurse check wounds as well- just to make sure-   -8-12: WBCs continue uptrending, 17.5 today, along with thrombocytosis and anemia.  Ordered urinalysis with reflex to culture, chest x-ray, initiate IV fluids as above.   Urinalysis within normal limits   Chest x-ray pending - RLL infiltrate, significant from last CXR, concerning for aspiration pneumonia. Start IV Zosyn 4.75 mg Q8H for 5 days, SLP consult placed for swallow eval. Labs in AM    8/13- WBC down to 10.7 from 17.5- doing much better- con't Zosyn for pneumonia- asked pt to use ICS q2 hours while awake.   Completed  Zosyn afebrile  24. Constipation  8/15- Will give Sorbitol 30cc- if no large BM by then  8/16- Large BM yesterday AM before Sorbitol 8/19- LBM 2 days ago- is hard/feels constipated- will change Colace and Senna to Senna-S and increase to 2 tabs BID  8/22- LBM last night- large- greenish brown- not black 8/24 will increase miralax to bid. Continue senna-s 2 tab bid 25. Anemia  8/15- has dropped, however at the time it was higher, had been hemo-concentrated- and is now doing better- will monitor closely.   8/16- Hb back up to 8.8- doing better  8/22- Last Hb 8.7- will monitor 26. Anal fissure  8/16- will order/d/w nursing meds from home for anal fissure- since we have in hospital.  27. Osteoporosis-   8/19- pt reports going ot start Fosamax, even before had fx- so needs it! D/w pt today and need for getting it started as soon as leaves.  28. Black stools  8/21- ordered hemoccult- hasn't had another BM yet. Also on Iron TID which could play a part- of note, Hb 8.7- was 8.8-9/1- but also is more hydrated- will recheck in AM-   8/23- no more black stools- will recheck Monday 29. Confusion/overtaking benadryl  8/22- got loopy and confused last night- taking benadryl 50 or more mg at a time including vistaril already ordered due to anxiety- will stop Benadryl- con't vistaril for now- educated pt cannot take home meds 30. Anxiety  8/22- will add Buspar 5 mg TID for anxiety and can increase as required- no more benadryl!  8/23-24- pt reports anxiety is doing somewhat better with Buspar- likes it a lot.       LOS: 14 days A FACE TO FACE EVALUATION WAS PERFORMED  Ranelle Oyster 06/30/2023, 8:03 AM    Addendum:

## 2023-06-30 NOTE — Progress Notes (Signed)
Hypoglycemic Event  CBG: 66  Treatment: dinner  Symptoms: None  Follow-up CBG: Time:1737  CBG Result:147  Possible Reasons for Event: Unknown  Comments/MD notified:followed protocol

## 2023-06-30 NOTE — Progress Notes (Addendum)
Physical Therapy Session Note  Patient Details  Name: Dylan Bruce MRN: [id number] Date of Birth: 01/10/1946  Today's Date: 06/30/2023 PT Individual Time: 0800-0855 + 1000-1040 PT Individual Time Calculation (min): 55 min + 40 min  Short Term Goals: Week 2:  PT Short Term Goal 1 (Week 2): Pt will require min A with bed mobility PT Short Term Goal 2 (Week 2): Pt will require min A with sit to stand transfers with LRAD PT Short Term Goal 3 (Week 2): Pt will require min A with gait x 10 ft with LRAD  Skilled Therapeutic Interventions/Progress Updates:     Session 1: Chart reviewed and pt agreeable to therapy. Pt received seated in recliner with 8/10 c/o pain in hip. Session focused on amb quality and endurance to promote safe home access. Pt initiated session with donning of LB dressing and shoes using maxA for threading and donning shoes as well. Pt then stood using MinA + RW and pt displayed good safety technique with stand. Pt then completed blocked practice of amb ranging 100-154ft using CGA + RW. Pt noted to have slow gait speed and decreased R step length consistent with antalgic gait 2/2 pn in left hip. Session education emphasized judging safe amb distances t/o recovery. At end of session, pt was left seated in recliner with alarm engaged, nurse call bell and all needs in reach.  Session 2: Chart reviewed and pt agreeable to therapy. Pt received seated in recliner with 6/10 c/o pain in L hip. Session focused on amb endurance, safety judgement, and confidence to promote safe home locomotion. Pt initiated session with maxA to donn shoes. Pt then completed blocked practice of amb using CGA + RW fading to S + Rw for distances of 50-69ft. Pt noted to have concern about need for WC follow, with pt encouraged to amb as far as comfortable without WC follow consider no WC follow will be present at home and pt amb is at S level. Pt requires CGA for sit to stand. Pt may sometimes need multiple tries  for successful standing. At end of session, pt was left seated in recliner with alarm engaged, nurse call bell and all needs in reach.        Therapy Documentation Precautions:  Precautions Precautions: Fall Restrictions Weight Bearing Restrictions: Yes LLE Weight Bearing: Weight bearing as tolerated General:       Therapy/Group: Individual Therapy  Dionne Milo, PT, DPT 06/30/2023, 12:58 PM

## 2023-06-30 NOTE — Progress Notes (Signed)
Hypoglycemic Event  CBG: 64  Treatment: 4 oz juice/soda  Symptoms: None  Follow-up CBG: Time:1643 CBG Result:66  Possible Reasons for Event: Unknown  Comments/MD notified:followed protocol    Gwenyth Allegra

## 2023-07-01 LAB — GLUCOSE, CAPILLARY
Glucose-Capillary: 142 mg/dL — ABNORMAL HIGH (ref 70–99)
Glucose-Capillary: 115 mg/dL — ABNORMAL HIGH (ref 70–99)
Glucose-Capillary: 124 mg/dL — ABNORMAL HIGH (ref 70–99)
Glucose-Capillary: 116 mg/dL — ABNORMAL HIGH (ref 70–99)

## 2023-07-01 NOTE — Plan of Care (Signed)
  Problem: Consults Goal: RH GENERAL PATIENT EDUCATION Description: See Patient Education module for education specifics. Outcome: Progressing   Problem: Consults Goal: Skin Care Protocol Initiated - if Braden Score 18 or less Description: If consults are not indicated, leave blank or document N/A Outcome: Progressing   Problem: Consults Goal: Diabetes Guidelines if Diabetic/Glucose > 140 Description: If diabetic or lab glucose is > 140 mg/dl - Initiate Diabetes/Hyperglycemia Guidelines & Document Interventions  Outcome: Progressing   Problem: RH BOWEL ELIMINATION Goal: RH STG MANAGE BOWEL WITH ASSISTANCE Description: STG Manage Bowel with min Assistance. Outcome: Progressing

## 2023-07-01 NOTE — Progress Notes (Signed)
Physical Therapy Weekly Progress Note  Patient Details  Name: KYNGSTON AMORIN MRN: [id number] Date of Birth: 13-Feb-1946  Beginning of progress report period: June 25, 2023 End of progress report period: July 01, 2023  Today's Date: 07/01/2023 PT Individual Time: 0915-1000 PT Individual Time Calculation (min): 45 min   Patient has met 3 of 3 short term goals.  Patient is making steady progress toward long term goals and largely requires supervision to min A with bed mobility, supervision to min A with transfers (depending on height of surface) and supervision with gait x 150 ft with RW. Pt initiated stair training today and requires min A with 2 steps x 6 inches with bilateral handrails. Plan to continue gait and stair training and start family training.   Patient continues to demonstrate the following deficits muscle weakness, decreased cardiorespiratoy endurance, and decreased standing balance and decreased postural control and therefore will continue to benefit from skilled PT intervention to increase functional independence with mobility.  Patient progressing toward long term goals..  Continue plan of care.  PT Short Term Goals Week 1:  PT Short Term Goal 1 (Week 1): Pt will be able to perform bed mobility with mod assist PT Short Term Goal 1 - Progress (Week 1): Met PT Short Term Goal 2 (Week 1): Pt will be able to perform sit <> stands with mod assist PT Short Term Goal 2 - Progress (Week 1): Met PT Short Term Goal 3 (Week 1): Pt will be able to perform bed <> w/c transfers with mod assist PT Short Term Goal 3 - Progress (Week 1): Met PT Short Term Goal 4 (Week 1): Pt will be able to gait x 25' with min assist PT Short Term Goal 4 - Progress (Week 1): Not met Week 2:  PT Short Term Goal 1 (Week 2): Pt will require min A with bed mobility PT Short Term Goal 1 - Progress (Week 2): Met PT Short Term Goal 2 (Week 2): Pt will require min A with sit to stand transfers with LRAD PT  Short Term Goal 2 - Progress (Week 2): Met PT Short Term Goal 3 (Week 2): Pt will require min A with gait x 10 ft with LRAD PT Short Term Goal 3 - Progress (Week 2): Met Week 3:  PT Short Term Goal 1 (Week 3): Pt will require CGA with bed mobility PT Short Term Goal 2 (Week 3): Pt will require CGA consistently with sit<>stand's from various surface levels PT Short Term Goal 3 (Week 3): Pt will require CGA  with gait x 200 ft  Skilled Therapeutic Interventions/Progress Updates:      Therapy Documentation Precautions:  Precautions Precautions: Fall Restrictions Weight Bearing Restrictions: Yes LLE Weight Bearing: Weight bearing as tolerated  Pt received seated edge of bed, agreeable to PT session with emphasis on gait and stair training. Pt reports pain is "not bad" and reports he received medication prior to session.   Pt mod A with lower body dressing and total A for donning of tennis shoes. Pt (S) with sit to stand and gait x 150 ft with RW. Pt demonstrates improved step through gait pattern and presents with increased L LE weight bearing in session.   Pt transitioned to stair training and PT demonstrated stepping sequencing. Pt requires min A with negotiation of 6 inch step with bilateral hand rails x 2. Pt apprehensive to try due to increased L LE weight bearing but willing to continue stair training throughout rehab stay.  Pt dependently transported to room for energy conservation by w/c and left seated in recliner at bedside with all needs in reach and alarm on.    Therapy/Group: Individual Therapy  Truitt Leep Truitt Leep PT, DPT  07/01/2023, 7:49 AM

## 2023-07-01 NOTE — Progress Notes (Signed)
Occupational Therapy Weekly Progress Note  Patient Details  Name: Dylan Bruce MRN: [id number] Date of Birth: Jul 27, 1946  Beginning of progress report period: June 24, 2023 End of progress report period: July 01, 2023  Today's Date: 07/01/2023 OT Individual Time: 1302-1400 & 6213-0865 OT Individual Time Calculation (min): 58 min & 47 min    Patient has met 3 of 3 short term goals.  Pt presenting with increased independence with all ADLs and functional mobility with increased time required to complete. Pt able to use AE as necessary in order to promote increased independence with LB dressing, bathing and donning/doffing footwear. Pt with good progression with STG and LTG during IPR stay.  Patient continues to demonstrate the following deficits: muscle weakness, decreased cardiorespiratoy endurance, and decreased standing balance and decreased balance strategies and therefore will continue to benefit from skilled OT intervention to enhance overall performance with BADL and Reduce care partner burden.  Patient progressing toward long term goals..  Continue plan of care.  OT Short Term Goals Week 2:  OT Short Term Goal 1 (Week 2): Pt will complete LB dressing with Min A with AE as necessary OT Short Term Goal 1 - Progress (Week 2): Met OT Short Term Goal 2 (Week 2): Pt will complete grooming in standing with Min A OT Short Term Goal 2 - Progress (Week 2): Met OT Short Term Goal 3 (Week 2): Pt will complete footwear Max A OT Short Term Goal 3 - Progress (Week 2): Met Week 3:  OT Short Term Goal 1 (Week 3): STG=LTG d/t ELOS  Skilled Therapeutic Interventions/Progress Updates:      Therapy Documentation Precautions:  Precautions Precautions: Fall Restrictions Weight Bearing Restrictions: Yes LLE Weight Bearing: Weight bearing as tolerated Session 1 General: "I was waiting to see you." Pt seated in recliner upon OT arrival, agreeable to OT.  Pain:  7/10 pain pain reported in Lt  leg, activity, intermittent rest breaks, distractions provided for pain management, pt reports tolerable to proceed.   ADL: Oral hygiene: SBA standing at sink with RW completing teeth/denture hygiene, standing for 5 min completing task, able to manipulate containers  Transfers: Pt ambulating SBA with RW and increased time and VC to increase stride length and correct posture. All transfers completed this date with RW and SBA with increased time to complete   Balance: Pt completing horse shoe activity in standing at RW for multiple trials in order to increase standing tolerance to increase independence with functional transfers and ADLs such as bathing.   Pt seated in recliner at end of session with W/C alarm donned, call light within reach and 4Ps assessed.   Session 2 General: "My son was just here." Pt seated in W/C upon OT arrival, agreeable to OT. Session held outside per pt request and increased quality of life.  Pain:  7/10 pain reported in Lt leg, activity, intermittent rest breaks, distractions provided for pain management, pt reports tolerable to proceed.   ADL: Transfers: Min A in order to rise from sitting to standing from lower surfaces SBA for increased height surfaces, pt completed functional ambulation with RW SBA with increased time and PRN rest breaks on uneven pavement with no LOB/SOB, all other transfers completed SBA with RW  Exercises: Pt completed the following exercise circuit in order to improve functional activity, strength and endurance to prepare for ADLs such as bathing. Pt completed the following exercises in seated position with no noted LOB/SOB and 3x10 repetitions on each exercise: -shoulder flexion -  forward rows   Other Tx: Pt educated on correct body mechanics for sit to stand transfers from decreased height surfaces.   Pt supine in bed with bed alarm activated, 2 bed rails up, call light within reach and 4Ps assessed.   Therapy/Group: Individual  Therapy  Velia Meyer, OTD, OTR/L 07/01/2023, 3:55 PM

## 2023-07-01 NOTE — Progress Notes (Signed)
PROGRESS NOTE   Subjective/Complaints:  Pt slept well. Feels better after large bm yesterday. Left hip pain per baseline  ROS: Patient denies fever, rash, sore throat, blurred vision, dizziness, nausea, vomiting, diarrhea, cough, shortness of breath or chest pain, headache, or mood change.      Objective:   No results found. No results for input(s): "WBC", "HGB", "HCT", "PLT" in the last 72 hours.  No results for input(s): "NA", "K", "CL", "CO2", "GLUCOSE", "BUN", "CREATININE", "CALCIUM" in the last 72 hours.   Intake/Output Summary (Last 24 hours) at 07/01/2023 0743 Last data filed at 07/01/2023 0548 Gross per 24 hour  Intake 240 ml  Output 1725 ml  Net -1485 ml     Pressure Injury 06/16/23 Sacrum Medial Stage 2 -  Partial thickness loss of dermis presenting as a shallow open injury with a red, pink wound bed without slough. small open area on coccyx in middle of gluteal cleft 1x1 cm (Active)  06/16/23 1848  Location: Sacrum  Location Orientation: Medial  Staging: Stage 2 -  Partial thickness loss of dermis presenting as a shallow open injury with a red, pink wound bed without slough.  Wound Description (Comments): small open area on coccyx in middle of gluteal cleft 1x1 cm  Present on Admission: Yes    Physical Exam: Vital Signs Blood pressure 135/76, pulse 64, temperature 98.5 F (36.9 C), temperature source Oral, resp. rate 18, height 6\' 2"  (1.88 m), weight 96.7 kg, SpO2 99%.       Constitutional: No distress . Vital signs reviewed. HEENT: NCAT, EOMI, oral membranes moist. Wearing CPAP Neck: supple Cardiovascular: RRR without murmur. No JVD    Respiratory/Chest: CTA Bilaterally without wheezes or rales. Normal effort    GI/Abdomen: BS +, non-tender, non-distended Ext: no clubbing, cyanosis, or edema Psych: pleasant and cooperative  Neurological: Alert and oriented x 3. Normal insight and awareness. Intact  Memory. Normal language and speech. Cranial nerve exam unremarkable.     Cervical back: Tenderness present.     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 Functional deficits which require 3+ hours per day of interdisciplinary therapy in a comprehensive inpatient rehab setting. Physiatrist is providing close team supervision and 24 hour management of active medical problems listed below. Physiatrist and rehab team continue to assess barriers to discharge/monitor patient progress toward functional and medical goals  Care Tool:  Bathing    Body parts bathed by patient: Right arm, Left arm, Left upper leg, Chest, Front perineal area, Abdomen, Face, Buttocks, Right upper leg   Body parts bathed by helper: Right lower leg, Left lower leg     Bathing assist Assist Level: Minimal Assistance - Patient > 75%     Upper Body Dressing/Undressing Upper body dressing   What is the patient wearing?: Pull over shirt    Upper body assist Assist Level: Minimal Assistance - Patient > 75%    Lower Body Dressing/Undressing Lower body dressing  Lower body assist Assist for lower body dressing: Moderate Assistance - Patient 50 - 74%     Toileting Toileting    Toileting assist Assist for toileting: Moderate Assistance - Patient 50 - 74%     Transfers Chair/bed transfer  Transfers assist     Chair/bed transfer assist level: Contact Guard/Touching assist     Locomotion Ambulation   Ambulation assist   Ambulation activity did not occur: Safety/medical concerns  Assist level: Contact Guard/Touching assist Assistive device: Walker-rolling Max distance: 100 ft   Walk 10 feet activity   Assist  Walk 10 feet activity did not occur: Safety/medical  concerns  Assist level: Contact Guard/Touching assist Assistive device: Walker-rolling   Walk 50 feet activity   Assist Walk 50 feet with 2 turns activity did not occur: Safety/medical concerns  Assist level: Contact Guard/Touching assist Assistive device: Walker-rolling    Walk 150 feet activity   Assist Walk 150 feet activity did not occur: Safety/medical concerns         Walk 10 feet on uneven surface  activity   Assist Walk 10 feet on uneven surfaces activity did not occur: Safety/medical concerns         Wheelchair     Assist Is the patient using a wheelchair?: Yes Type of Wheelchair: Manual    Wheelchair assist level: Dependent - Patient 0%      Wheelchair 50 feet with 2 turns activity    Assist        Assist Level: Dependent - Patient 0%   Wheelchair 150 feet activity     Assist      Assist Level: Dependent - Patient 0%   Blood pressure 135/76, pulse 64, temperature 98.5 F (36.9 C), temperature source Oral, resp. rate 18, height 6\' 2"  (1.88 m), weight 96.7 kg, SpO2 99%.  Medical Problem List and Plan: 1. Functional deficits secondary to  L intertrochanteric hip fx s/p IM nail WBAT             -patient may  shower             -ELOS/Goals: 10-14 days supervision             D/c 9/6  -Continue CIR therapies including PT, OT  2.  Antithrombotics: -DVT/anticoagulation:  Pharmaceutical: Eliquis             -antiplatelet therapy: Plavix , change protonix to pepcid as this will help efficacy    3. 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   No complaints of pain today, continue current regimen  8/13- said pain not controlled from hour 3-4- but declined Oxycontin for pain-stopped last dose of Nabumetone- since Cr up so much  8/14- won't change pain meds since more confused per staff- pneumonia vs chronic? Pt asking again for q3 hours prn meds- explained  will not do this.   8/15- pt agreed to keep pain meds aq4 hours- educated has been a little confused- pt agreed  8/19-8/20-  pt doing better, but wants to add Cosentyx- as below-   8/25- Cosentyx to be brought in from home--still waiting 4. Mood/Behavior/Sleep: LCSW to evaluate and provide emotional support             -antipsychotic agents: Vilazodone 40 mg daily             -continue Remeron 30 mg q HS             -continue Provigil 100 mg daily   8/16-   Vistaril to BID prn   5. Neuropsych/cognition: This patient is capable of making decisions on his own behalf.   6. Skin/Wound Care: Routine skin care checks             -sacral pressure injury; continue local care/pressure relief             -monitor surgical incision             L elbow and R lower leg wounds- sutures out, glued 8/21- will get sutures out of L wrist and L hip is glued.  7. Fluids/Electrolytes/Nutrition: Routine Is and Os and follow-up chemistries             -continue Mag-ox 400 mg daily   8: Hypertension: monitor TID and prn -Continue Imdur 30 mg daily -continue Demadex 20 mg daily   Vitals:   06/30/23 1917 07/01/23 0324  BP: 123/60 135/76  Pulse: 66 64  Resp: 18 18  Temp: 98.9 F (37.2 C) 98.5 F (36.9 C)  SpO2: 97% 99%   Fair control 8/18  8/19-8/20 BP controlled- con't regimen  8/21- BP a little elevated this AM 158 systolic- usually 130s-150s- might benefit from hydrochlorothiazide?  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  8/13- CBGs running 199 to 265 in last 24 hours- since Semglee reduced. Don't feel comfortable adding PO meds since Cr up to 1.78 today  CBG (last 3)  Recent Labs    06/30/23 1737 06/30/23 2039 07/01/23 0619  GLUCAP 147* 209* 142*  Reduce semglee , cannot use metformin, also allergies preclude glimipride , consider jardiance, took metformin at home may restart if creat 1.2 or better   8/19- CBG's 122 to 249- needs something, but Jardiance cannot use due to hyperkalemia and Metformin not appropriate due to Variable/labile Cr/BUN- Semglee was reduced yesterday- will give 24 hours to follow trend and treat hyperkalemia.   8/20- CBGs 110-233- will try Tradjenta 5 mg daily since not renally dosed  8/24-well controlled. might need ot change Tradjenta due to  cost at d/c.   8/25 increased cbg last night likely d/t lower numbers during day and subsequent attempts to raise sugar--continue current regimen 11: Venous stasis: RLE pre-tibial ulcer; continue local wound care   12: S/p left IM nail Dr. [name]             -weight bearing at tolerated   13: Sleep apnea: CPAP at 17 nightly   14: Left rib fractures: pain control and pulmonary toilet   15: Paroxysmal atrial fib: on Eliquis, Imdur, Demadex (rate controlled no other meds at home)   8/12: EKG with normal ventricular rate, however in atrial flutter, new diagnosis.  Not on rate controlling medications, cardiology  consulted for input.. Getting IVF as below. Patient otherwise stable.   8/14- not new per chart- had pAfib- although pt said not aware (poor STM)- con't since rate appears controlled OFF meds- actually running 50s to 90s  8/15- HR in 50s-60s- so not appropriate for rate meds.   8/19- HR running in the  60s 16: BPH: continue Flomax   17:Psoriasis/psoriatic arthritis: his family brought his Desonide 0.05% cream and clobetasol 0.05% cream to apply to hands, elbows, knees daily as needed             -If can get Cosentyx from home, can give to him- 2 weeks late, will make him hurt too much  8/11- educated pt don't have Cosentyx in hospital- will need ot bring form home  8/19- asked pt to have family bring in Cosentyx- think this will help his pain and risks of infection outweighed by benefits of pain reduction  8/20- family to bring in   8/23-  ? should be here soon 18: Rosacea: his family brought in his Soolantra (ivermectin) 1% cream to apply to face daily as needed   8/11- ordered by pharmacy today  8/15- meds brought in and sent to pharmacy 19: s/p CABG 2023   20: Osteoarthritis: S/p left shoulder arthroplasty; needs the right shoulder done as well- has R frozen shoulder as a result.    21. 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    8/11- BUN up to 60- very dry- and Cr up some- will give IVFs 60cc/hour after therapy til tomorrow and recheck labs in AM  8/12: Creatinine still increasing 1.99, along with increased potassium and downtrending sodium; increase fluid rate today to 1 L IV fluid at 75/h, urine studies pending, repeat BMP at 2 PM to ensure no further worsening -if creatinine increasing or K increasing, will consult nephrology at that time.  May need bicarbonate.  - Urinalysis normal, urine sodium and serum osmolality appear prerenal, awaiting urine osmolality; K responsive to  IVF, continue for 1L, Cr/BUN stable will repeat in AM  8/13- Wil give another 12 hours of NS IVFs 100cc/hour- Cr down to 1.78 and BUN still 84-   8/14- Cr 1.45 down from 1.78 and BUN 67 down from 84- will give 1 more 12 hours of IVFs and recheck in AM   8/16- labs came back late- Cr 1.35 and BUN 48- improving- con't to push PO fluids.   8/19- BUN down to 34 and Cr 1.24- much improved- con't to monitor weekly. Will give Lokelma for K+ 5.2- will recheck in AM  8/20- Got Lokelma but K+ up further to 5.3- spoke with pharmacy- will give Lokelma 10 G BID x 2 doses and give IVF NS-  75cc/hour x 16 hours to start  at 3pm- and recheck in AM- if K+ isn't down in AM, will call renal.   8/21- K+ 5.3 even after lokelma BID- we discussed that wait and recheck in AM- they aren't worried until >5.5- which makes sense- will recheck in AM and go from there- might benefit from hydrochlorothiazide? Cr down to 1.18 and BUN still 41- pt drinking 2 cups/day of water- encouraged to drink 6-8 cups/day.   8/22- Cr down to 1.06 but BUN stable at 41- K+ 5.2 again- will not keep intervening since stable off Lokelma-   8/24-25 off IVFs and recheck labs Monday  22. - Walked, but has Spina bifida with frequent falls- will d/w therapy.    23. Leukocytosis - resolved now on last day of abx   8/11- afebrile- feel good- will recheck in AM- if doesn't improve, will check U/A and Cx and CXR- having nurse check wounds as well- just to make sure-   -8-12: WBCs continue uptrending, 17.5 today, along with thrombocytosis and anemia.  Ordered urinalysis with reflex to culture, chest x-ray, initiate IV fluids as above.   Urinalysis within normal limits   Chest x-ray pending - RLL infiltrate, significant from last CXR, concerning for aspiration pneumonia. Start IV Zosyn 4.75 mg Q8H for 5 days, SLP consult placed for swallow eval. Labs in AM    8/13- WBC down to 10.7 from 17.5- doing much better- con't Zosyn for pneumonia- asked pt to use ICS q2  hours while awake.   Completed Zosyn afebrile  24. Constipation  8/15- Will give Sorbitol 30cc- if no large BM by then  8/16- Large BM yesterday AM before Sorbitol 8/19- LBM 2 days ago- is hard/feels constipated- will change Colace and Senna to Senna-S and increase to 2 tabs BID  8/22- LBM last night- large- greenish brown- not black 8/25 increased miralax to bid 8/24--had large bm yesterday -Continue senna-s 2 tab bid 25. Anemia  8/15- has dropped, however at the time it was higher, had been hemo-concentrated- and is now doing better- will monitor closely.   8/16- Hb back up to 8.8- doing better  8/22- Last Hb 8.7- will monitor 26. Anal fissure  8/16- will order/d/w nursing meds from home for anal fissure- since we have in hospital.  27. Osteoporosis-   8/19- pt reports going ot start Fosamax, even before had fx- so needs it! D/w pt today and need for getting it started as soon as leaves.  28. Black stools  8/21- ordered hemoccult- hasn't had another BM yet. Also on Iron TID which could play a part- of note, Hb 8.7- was 8.8-9/1- but also is more hydrated- will recheck in AM-   8/23- no more black stools- will recheck Monday 29. Confusion/overtaking benadryl  8/22- got loopy and confused last night- taking benadryl 50 or more mg at a time including vistaril already ordered due to anxiety- will stop Benadryl- con't vistaril for now- educated pt cannot take home meds 30. Anxiety  8/22- will add Buspar 5 mg TID for anxiety and can increase as required- no more benadryl!  8/23-24- pt reports anxiety is doing somewhat better with Buspar- likes it a lot.       LOS: 15 days A FACE TO FACE EVALUATION WAS PERFORMED  Ranelle Oyster 07/01/2023, 7:43 AM    Addendum:

## 2023-07-02 LAB — CBC WITH DIFFERENTIAL/PLATELET
Abs Immature Granulocytes: 0.03 10*3/uL (ref 0.00–0.07)
Basophils Absolute: 0 10*3/uL (ref 0.0–0.1)
Basophils Relative: 1 %
Eosinophils Absolute: 0.3 10*3/uL (ref 0.0–0.5)
Eosinophils Relative: 5 %
HCT: 27.7 % — ABNORMAL LOW (ref 39.0–52.0)
Hemoglobin: 8.4 g/dL — ABNORMAL LOW (ref 13.0–17.0)
Immature Granulocytes: 1 %
Lymphocytes Relative: 17 %
Lymphs Abs: 0.9 10*3/uL (ref 0.7–4.0)
MCH: 24.8 pg — ABNORMAL LOW (ref 26.0–34.0)
MCHC: 30.3 g/dL (ref 30.0–36.0)
MCV: 81.7 fL (ref 80.0–100.0)
Monocytes Absolute: 1.1 10*3/uL — ABNORMAL HIGH (ref 0.1–1.0)
Monocytes Relative: 20 %
Neutro Abs: 3 10*3/uL (ref 1.7–7.7)
Neutrophils Relative %: 56 %
Platelets: 408 10*3/uL — ABNORMAL HIGH (ref 150–400)
RBC: 3.39 MIL/uL — ABNORMAL LOW (ref 4.22–5.81)
RDW: 20.2 % — ABNORMAL HIGH (ref 11.5–15.5)
WBC: 5.4 10*3/uL (ref 4.0–10.5)
nRBC: 0 % (ref 0.0–0.2)

## 2023-07-02 LAB — BASIC METABOLIC PANEL
Anion gap: 5 (ref 5–15)
BUN: 45 mg/dL — ABNORMAL HIGH (ref 8–23)
CO2: 25 mmol/L (ref 22–32)
Calcium: 8.5 mg/dL — ABNORMAL LOW (ref 8.9–10.3)
Chloride: 104 mmol/L (ref 98–111)
Creatinine, Ser: 1.34 mg/dL — ABNORMAL HIGH (ref 0.61–1.24)
GFR, Estimated: 55 mL/min — ABNORMAL LOW (ref >60)
Glucose, Bld: 142 mg/dL — ABNORMAL HIGH (ref 70–99)
Potassium: 5.4 mmol/L — ABNORMAL HIGH (ref 3.5–5.1)
Sodium: 134 mmol/L — ABNORMAL LOW (ref 135–145)

## 2023-07-02 LAB — GLUCOSE, CAPILLARY
Glucose-Capillary: 145 mg/dL — ABNORMAL HIGH (ref 70–99)
Glucose-Capillary: 144 mg/dL — ABNORMAL HIGH (ref 70–99)
Glucose-Capillary: 109 mg/dL — ABNORMAL HIGH (ref 70–99)
Glucose-Capillary: 131 mg/dL — ABNORMAL HIGH (ref 70–99)
Glucose-Capillary: 115 mg/dL — ABNORMAL HIGH (ref 70–99)

## 2023-07-02 MED ORDER — SODIUM ZIRCONIUM CYCLOSILICATE 10 G PO PACK
10.0000 g | PACK | Freq: Two times a day (BID) | ORAL | Status: AC
  Administered 2023-07-02 (×2): 10 g via ORAL
  Filled 2023-07-02 (×2): qty 1

## 2023-07-02 NOTE — Progress Notes (Signed)
Patient ID: Dylan Bruce, male   DOB: [birth year], 77 y.o.   MRN: [id number]  [name] has scheduled family training also for Wed 10-12 and want to keep Friday 10-12 for car transfer. Await team's recommendations for equipment.

## 2023-07-02 NOTE — Progress Notes (Signed)
Patient claims he has trouble breathing. Lungs are clear on auscultation; patient requesting breathing treatment. RT notified.

## 2023-07-02 NOTE — Progress Notes (Signed)
PROGRESS NOTE   Subjective/Complaints:  Pt reports LBM Saturday- doesn't feel constipated yet.   But admits BM was a "problem" and Dr Riley Kill "fixed it".  Said got Cosentyx shot this weekend ? Asking if should reduce pain meds.   Per nursing, had accident of bladder last night x1, but Ox3 overnight.   ROS:  Pt denies SOB, abd pain, CP, N/V/C/D, and vision changes  Except for HPI  Objective:   No results found. Recent Labs    07/02/23 0717  WBC 5.4  HGB 8.4*  HCT 27.7*  PLT 408*    Recent Labs    07/02/23 0717  NA 134*  K 5.4*  CL 104  CO2 25  GLUCOSE 142*  BUN 45*  CREATININE 1.34*  CALCIUM 8.5*     Intake/Output Summary (Last 24 hours) at 07/02/2023 0849 Last data filed at 07/02/2023 1601 Gross per 24 hour  Intake 360 ml  Output 1075 ml  Net -715 ml     Pressure Injury 06/16/23 Sacrum Medial Stage 2 -  Partial thickness loss of dermis presenting as a shallow open injury with a red, pink wound bed without slough. small open area on coccyx in middle of gluteal cleft 1x1 cm (Active)  06/16/23 1848  Location: Sacrum  Location Orientation: Medial  Staging: Stage 2 -  Partial thickness loss of dermis presenting as a shallow open injury with a red, pink wound bed without slough.  Wound Description (Comments): small open area on coccyx in middle of gluteal cleft 1x1 cm  Present on Admission: Yes    Physical Exam: Vital Signs Blood pressure (!) 148/69, pulse 65, temperature 97.7 F (36.5 C), resp. rate 20, height 6\' 2"  (1.88 m), weight 97.2 kg, SpO2 100%.       General: awake, alert, appropriate, sitting up in bed; after took CPAP off; NAD HENT: conjugate gaze; oropharynx moist CV: regular rate in Afib; no JVD Pulmonary: CTA B/L; no W/R/R- good air movement- sounds good GI: soft, NT, ND, (+)BS- normoactive Psychiatric: appropriate- mor einteractive Neurological: Ox3-  Ext: no clubbing,  cyanosis, or edema Psych: pleasant and cooperative  Neurological: Alert and oriented x 3. Normal insight and awareness. Intact Memory. Normal language and speech. Cranial nerve exam unremarkable.     Cervical back: Tenderness present.     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 Functional deficits which require 3+ hours per day of interdisciplinary therapy in a comprehensive inpatient rehab setting. Physiatrist is providing close team supervision and 24 hour management of active medical problems listed below. Physiatrist and rehab team continue to assess barriers to discharge/monitor patient progress toward functional and medical goals  Care Tool:  Bathing    Body parts bathed by patient: Right arm, Left arm, Left upper leg, Chest, Front perineal area, Abdomen, Face, Buttocks, Right upper leg, Right lower leg, Left lower leg   Body parts bathed by helper: Right lower leg, Left lower leg  Bathing assist Assist Level: Supervision/Verbal cueing     Upper Body Dressing/Undressing Upper body dressing   What is the patient wearing?: Pull over shirt    Upper body assist Assist Level: Supervision/Verbal cueing    Lower Body Dressing/Undressing Lower body dressing      What is the patient wearing?: Underwear/pull up, Pants     Lower body assist Assist for lower body dressing: Supervision/Verbal cueing     Toileting Toileting    Toileting assist Assist for toileting: Contact Guard/Touching assist     Transfers Chair/bed transfer  Transfers assist     Chair/bed transfer assist level: Supervision/Verbal cueing     Locomotion Ambulation   Ambulation assist   Ambulation activity did not occur: Safety/medical concerns  Assist level: Contact  Guard/Touching assist Assistive device: Walker-rolling Max distance: 100 ft   Walk 10 feet activity   Assist  Walk 10 feet activity did not occur: Safety/medical concerns  Assist level: Contact Guard/Touching assist Assistive device: Walker-rolling   Walk 50 feet activity   Assist Walk 50 feet with 2 turns activity did not occur: Safety/medical concerns  Assist level: Contact Guard/Touching assist Assistive device: Walker-rolling    Walk 150 feet activity   Assist Walk 150 feet activity did not occur: Safety/medical concerns         Walk 10 feet on uneven surface  activity   Assist Walk 10 feet on uneven surfaces activity did not occur: Safety/medical concerns         Wheelchair     Assist Is the patient using a wheelchair?: Yes Type of Wheelchair: Manual    Wheelchair assist level: Dependent - Patient 0%      Wheelchair 50 feet with 2 turns activity    Assist        Assist Level: Dependent - Patient 0%   Wheelchair 150 feet activity     Assist      Assist Level: Dependent - Patient 0%   Blood pressure (!) 148/69, pulse 65, temperature 97.7 F (36.5 C), resp. rate 20, height 6\' 2"  (1.88 m), weight 97.2 kg, SpO2 100%.  Medical Problem List and Plan: 1. Functional deficits secondary to  L intertrochanteric hip fx s/p IM nail WBAT             -patient may  shower             -ELOS/Goals: 10-14 days supervision             D/c 9/6  -Con't CIR- PT and OT  Pt said willing to reduce pain meds- suggested take pain meds 5x/day not 6x/day.  2.  Antithrombotics: -DVT/anticoagulation:  Pharmaceutical: Eliquis             -antiplatelet therapy: Plavix , change protonix to pepcid as this will help efficacy    3. 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   No complaints of pain today, continue current regimen  8/13- said pain not controlled from hour 3-4- but declined Oxycontin for pain-stopped last dose of Nabumetone- since Cr up so much  8/14- won't change pain meds since more confused per staff- pneumonia vs chronic? Pt asking again for q3 hours prn meds- explained will not do this.   8/15- pt agreed to keep pain meds aq4 hours- educated has been a little confused- pt agreed  8/19-8/20-  pt doing better, but wants to add Cosentyx- as below-   8/25- Cosentyx to be brought in from home--still waiting  8/26- pt said got Cosentyx- not clear in chart or on MAR. We discussed decreasing pain meds to 5x/day, not 6x/day, but he needs ot reduce times asking for it.  4. Mood/Behavior/Sleep: LCSW to evaluate and provide emotional support             -antipsychotic agents: Vilazodone 40 mg daily             -continue Remeron 30 mg q HS             -continue Provigil 100 mg daily   8/16-   Vistaril to BID prn   5. Neuropsych/cognition: This patient is capable of making decisions on his own behalf.   6. Skin/Wound Care: Routine skin care checks             -sacral pressure injury; continue local care/pressure relief             -monitor surgical incision             L elbow and R lower leg wounds- sutures out, glued 8/21- will get sutures out of L wrist and L hip is glued.  7. Fluids/Electrolytes/Nutrition: Routine Is and Os and follow-up chemistries             -continue Mag-ox 400 mg daily   8: Hypertension: monitor TID and prn -Continue Imdur 30 mg daily -continue Demadex 20 mg daily   Vitals:   07/02/23 0342 07/02/23 0838  BP: (!) 148/69   Pulse: 63 65  Resp: 20 20  Temp: 97.7 F  (36.5 C)   SpO2: 100% 100%   Fair control 8/18  8/19-8/20 BP controlled- con't regimen  8/21- BP a little elevated this AM 158 systolic- usually 130s-150s- might benefit from hydrochlorothiazide?  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  8/13- CBGs running 199 to 265 in last 24 hours- since Semglee reduced. Don't feel comfortable adding PO meds since Cr up to 1.78 today  CBG (last 3)  Recent  Labs    07/01/23 2056 07/02/23 0439 07/02/23 0600  GLUCAP 116* 145* 144*  Reduce semglee , cannot use metformin, also allergies preclude glimipride , consider jardiance, took metformin at home may restart if creat 1.2 or better   8/19- CBG's 122 to 249- needs something, but Jardiance cannot use due to hyperkalemia and Metformin not appropriate due to Variable/labile Cr/BUN- Semglee was reduced yesterday- will give 24 hours to follow trend and treat hyperkalemia.   8/20- CBGs 110-233- will try Tradjenta 5 mg daily since not renally dosed  8/24-well controlled. might need ot change Tradjenta due to cost at d/c.   8/25 increased cbg last night likely d/t lower numbers during day and subsequent attempts to raise sugar--continue current regimen  8/26- BG's well controled in last 24 hours 11: Venous  stasis: RLE pre-tibial ulcer; continue local wound care   12: S/p left IM nail Dr. [name]             -weight bearing at tolerated   13: Sleep apnea: CPAP at 17 nightly   14: Left rib fractures: pain control and pulmonary toilet   15: Paroxysmal atrial fib: on Eliquis, Imdur, Demadex (rate controlled no other meds at home)   8/12: EKG with normal ventricular rate, however in atrial flutter, new diagnosis.  Not on rate controlling medications, cardiology consulted for input.. Getting IVF as below. Patient otherwise stable.   8/14- not new per chart- had pAfib- although pt said not aware (poor STM)- con't since rate appears controlled OFF meds- actually running 50s to 90s  8/15- HR in 50s-60s- so not appropriate for rate meds.   8/19- HR running in the  60s 16: BPH: continue Flomax   17:Psoriasis/psoriatic arthritis: his family brought his Desonide 0.05% cream and clobetasol 0.05% cream to apply to hands, elbows, knees daily as needed             -If can get Cosentyx from home, can give to him- 2 weeks late, will make him hurt too much  8/11- educated pt don't have Cosentyx in hospital- will need ot bring form home  8/19- asked pt to have family bring in Cosentyx- think this will help his pain and risks of infection outweighed by benefits of pain reduction  8/20- family to bring in   8/23-  ? should be here soon  8/26- pt said he received- but I don't see if documented.  18: Rosacea: his family brought in his Soolantra (ivermectin) 1% cream to apply to face daily as needed   8/11- ordered by pharmacy today  8/15- meds brought in and sent to pharmacy 19: s/p CABG 2023   20: Osteoarthritis: S/p left shoulder arthroplasty; needs the right shoulder done as well- has R frozen shoulder as a result.    21. 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    8/11- BUN up to 60- very dry- and Cr  up some- will give IVFs 60cc/hour after therapy til tomorrow and recheck labs in AM  8/12: Creatinine still increasing 1.99, along with increased potassium and downtrending sodium; increase fluid rate today to 1 L IV fluid at 75/h, urine studies pending, repeat BMP at 2 PM to ensure no further worsening -if creatinine increasing or K increasing, will consult nephrology at that time.  May need bicarbonate.  - Urinalysis normal, urine sodium and serum osmolality appear prerenal, awaiting urine osmolality; K responsive to  IVF, continue for 1L, Cr/BUN stable will repeat in AM  8/13- Wil give another 12 hours of NS IVFs 100cc/hour- Cr down to 1.78 and BUN still 84-   8/14- Cr 1.45 down from 1.78 and BUN 67 down from 84- will give 1 more 12 hours of IVFs and recheck in AM   8/16- labs came back late- Cr 1.35 and BUN 48- improving- con't to push PO fluids.   8/19- BUN down to 34 and Cr 1.24- much improved- con't to monitor weekly. Will give Lokelma for K+ 5.2- will recheck in AM  8/20- Got Lokelma but K+ up further to 5.3- spoke with pharmacy- will give Lokelma 10 G BID x 2 doses and give IVF NS- 75cc/hour x 16 hours to start  at 3pm- and recheck in AM- if K+ isn't down in AM, will call renal.   8/21- K+ 5.3 even after lokelma BID- we discussed that wait and recheck in AM- they aren't worried until >5.5- which makes sense- will recheck in AM and go from there- might benefit from hydrochlorothiazide? Cr down to 1.18 and BUN still 41- pt drinking 2 cups/day of water- encouraged to drink 6-8 cups/day.   8/22- Cr down to 1.06 but BUN stable at 41- K+ 5.2 again- will not keep intervening since stable off Lokelma-   8/24-25 off IVFs and recheck labs Monday   8/26- K+ 5.4- and Cr back up to 1.35 and BUN 45- not drinking enough- will give Lokelma x2 and recheck in AM 22. - Walked, but has Spina bifida with frequent falls- will d/w therapy.    23. Leukocytosis - resolved now on last day of abx   8/11- afebrile- feel  good- will recheck in AM- if doesn't improve, will check U/A and Cx and CXR- having nurse check wounds as well- just to make sure-   -8-12: WBCs continue uptrending, 17.5 today, along with thrombocytosis and anemia.  Ordered urinalysis with reflex to culture, chest x-ray, initiate IV fluids as above.   Urinalysis within normal limits   Chest x-ray pending - RLL infiltrate, significant from last CXR, concerning for aspiration pneumonia. Start IV Zosyn 4.75 mg Q8H for 5 days, SLP consult placed for swallow eval. Labs in AM    8/13- WBC down to 10.7 from 17.5- doing much better- con't Zosyn for pneumonia- asked pt to use ICS q2 hours while awake.   Completed Zosyn afebrile  24. Constipation  8/15- Will give Sorbitol 30cc- if no large BM by then  8/16- Large BM yesterday AM before Sorbitol 8/19- LBM 2 days ago- is hard/feels constipated- will change Colace and Senna to Senna-S and increase to 2 tabs BID  8/22- LBM last night- large- greenish brown- not black 8/25 increased miralax to bid 8/24--had large bm yesterday -Continue senna-s 2 tab bid 8/26- LBM Saturday- will intervene again tomorrow if need be 25. Anemia  8/15- has dropped, however at the time it was higher, had been hemo-concentrated- and is now doing better- will monitor closely.   8/16- Hb back up to 8.8- doing better  8/22- Last Hb 8.7- will monitor 26. Anal fissure  8/16- will order/d/w nursing meds from home for anal fissure- since we have in hospital.  27. Osteoporosis-   8/19- pt reports going ot start Fosamax, even before had fx- so needs it! D/w pt today and need for getting it started as soon as leaves.  28. Black stools  8/21- ordered hemoccult- hasn't had another BM yet. Also on  Iron TID which could play a part- of note, Hb 8.7- was 8.8-9/1- but also is more hydrated- will recheck in AM-   8/23- no more black stools- will recheck Monday 29. Confusion/overtaking benadryl  8/22- got loopy and confused last night- taking  benadryl 50 or more mg at a time including vistaril already ordered due to anxiety- will stop Benadryl- con't vistaril for now- educated pt cannot take home meds 30. Anxiety  8/22- will add Buspar 5 mg TID for anxiety and can increase as required- no more benadryl!  8/23-24- pt reports anxiety is doing somewhat better with Buspar- likes it a lot. 8/26- Anxiety better overall- except used Vistaril last night x1.    I spent a total of  50   minutes on total care today- >50% coordination of care- due to   Complex medical issues with BUN/Cr/K+ reviewed labs, MAR for Cosentyx, and weekend labs, vitals and notes- also reviewed BP's and discussed at length with pt about weaning Pain meds      LOS: 16 days A FACE TO FACE EVALUATION WAS PERFORMED  Salisha Bardsley 07/02/2023, 8:49 AM    Addendum:

## 2023-07-02 NOTE — Progress Notes (Signed)
Occupational Therapy Session Note  Patient Details  Name: Dylan Bruce MRN: [id number] Date of Birth: 03-Dec-1945  Today's Date: 07/02/2023 OT Individual Time: 0900-1000 OT Individual Time Calculation (min): 60 min    Short Term Goals: Week 2:  OT Short Term Goal 1 (Week 2): Pt will complete LB dressing with Min A with AE as necessary OT Short Term Goal 1 - Progress (Week 2): Met OT Short Term Goal 2 (Week 2): Pt will complete grooming in standing with Min A OT Short Term Goal 2 - Progress (Week 2): Met OT Short Term Goal 3 (Week 2): Pt will complete footwear Max A OT Short Term Goal 3 - Progress (Week 2): Met  Skilled Therapeutic Interventions/Progress Updates:      Therapy Documentation Precautions:  Precautions Precautions: Fall Restrictions Weight Bearing Restrictions: Yes LLE Weight Bearing: Weight bearing as tolerated General: "I would like to shower if you have a chance. Pt supine in bed upon OT arrival, agreeable to OT session.  Pain:  7/10 pain reported in Lt hip, shower, activity, intermittent rest breaks, distractions provided for pain management, pt reports tolerable to proceed.   ADL: Bed mobility: SBA with increased time from supine><EOB and scooting up in bed  Grooming: SBA seated EOB to apply barrier cream to buttocks and antifungal ointment to under stomach Toilet transfer: SBA with RW ambulating from bed><bariatric commode over toilet Toileting: SBA, standing to complete posterior hygiene after BM, able to manage pants UB dressing: SBA seated EOB to don/doff overhead shirt LB dressing: SBA using reacher to manage pants over Lt leg, able to complete figure 4 with RLE to don over foot, able to manage over pants Footwear: total A d/t time constraints, usually SBA with socks aide Shower transfer: SBA with RW ambulating from toilet><TTB Bathing: SBA overall, standing at grab bars to complete peri cleaning, using LHS and removable shower head Transfers: overall  SBA with RW, VC for increased stride length   Pt requiring increased time to complete ADLs with cues for task direction.   Other Treatments: OT replaced sacrum and elbow bandage d/t getting wet in shower.    Pt supine in bed with bed alarm activated, 2 bed rails up, call light within reach and 4Ps assessed.   Therapy/Group: Individual Therapy  Velia Meyer, OTD, OTR/L 07/02/2023, 12:47 PM

## 2023-07-02 NOTE — Progress Notes (Signed)
Physical Therapy Session Note  Patient Details  Name: Dylan Bruce MRN: [id number] Date of Birth: 1946/01/07  Today's Date: 07/02/2023 PT Individual Time: 1057-1205, 1445-1530  PT Individual Time Calculation (min): 68 min , 45 min   Short Term Goals: Week 3:  PT Short Term Goal 1 (Week 3): Pt will require CGA with bed mobility PT Short Term Goal 2 (Week 3): Pt will require CGA consistently with sit<>stand's from various surface levels PT Short Term Goal 3 (Week 3): Pt will require CGA  with gait x 200 ft  Skilled Therapeutic Interventions/Progress Updates:      Therapy Documentation Precautions:  Precautions Precautions: Fall Restrictions Weight Bearing Restrictions: Yes LLE Weight Bearing: Weight bearing as tolerated   Treatment Session 1:   Pt received semi-reclined in bed, agreeable to PT and without reports of pain in session. Pt encouraged to sit upright more between sessions to improve pulmonary function.   Pt (S) supine to sit and with sit to stand and gait x 150 ft to main gym with RW without wheelchair follow. PT educated pt current recommendation is to discharge without wheelchair and encouraged pt to practice gait training without w/c follow. Pt demonstrates good technique and increase left LE weight bearing and practiced energy conservation techniques with standing rest breaks.   Pt ambulated additional 150 ft with RW CGA to ortho gym to practice car transfer. Pt reports his son who drives a truck without a running board will pick up him from the hospital. PT plans to schedule time with patient and son to practice real life car transfer as pt unable to fully complete today due to positioning/fatigue. Pt able to sit and required min A for L LE management but unable to tolerate knee flexion in car simulator. Pt returned to w/c and transported to room and left seated in recliner at bedside with all needs in reach and alarm on.    Treatment Session 2:   Pt received  seated in w/c at bedside with spouse present. Plan for family education with PT/OT on Wednesday from 10-12 and for pt's son to return for additional family training on Friday for real life truck transfer practice. PT notified SW and pt/family in agreement.   Pt without reports of pain in session and (S) with transfer and ambulation ~100 ft to ortho gym with RW with min verbal cues for shoulder depression and upward gaze.   Pt participated in blocked practice of 6 sit<>stand transfers from standard chair with arm rest with (S) to simulate community mobility tasks in the future. Pt transitioned to performing 4 mini stands with staggered stance to increase left LE weight bearing with transfers.   Pt (S) with ambulation to room with RW and continent of voiding with urinal, documented in flowsheet.   Pt left seated in recliner at bedside with all needs in reach and alarm on.   Therapy/Group: Individual Therapy  Truitt Leep Truitt Leep PT, DPT  07/02/2023, 10:14 AM

## 2023-07-03 ENCOUNTER — Inpatient Hospital Stay (HOSPITAL_COMMUNITY): Payer: Commercial Managed Care - PPO

## 2023-07-03 LAB — BASIC METABOLIC PANEL
Anion gap: 6 (ref 5–15)
BUN: 44 mg/dL — ABNORMAL HIGH (ref 8–23)
CO2: 23 mmol/L (ref 22–32)
Calcium: 8.4 mg/dL — ABNORMAL LOW (ref 8.9–10.3)
Chloride: 107 mmol/L (ref 98–111)
Creatinine, Ser: 1.48 mg/dL — ABNORMAL HIGH (ref 0.61–1.24)
GFR, Estimated: 49 mL/min — ABNORMAL LOW (ref >60)
Glucose, Bld: 174 mg/dL — ABNORMAL HIGH (ref 70–99)
Potassium: 5 mmol/L (ref 3.5–5.1)
Sodium: 136 mmol/L (ref 135–145)

## 2023-07-03 LAB — GLUCOSE, CAPILLARY
Glucose-Capillary: 179 mg/dL — ABNORMAL HIGH (ref 70–99)
Glucose-Capillary: 61 mg/dL — ABNORMAL LOW (ref 70–99)
Glucose-Capillary: 74 mg/dL (ref 70–99)
Glucose-Capillary: 199 mg/dL — ABNORMAL HIGH (ref 70–99)
Glucose-Capillary: 138 mg/dL — ABNORMAL HIGH (ref 70–99)

## 2023-07-03 MED ORDER — OXYMETAZOLINE HCL 0.05 % NA SOLN
1.0000 | Freq: Two times a day (BID) | NASAL | Status: AC | PRN
  Administered 2023-07-03 – 2023-07-04 (×3): 1 via NASAL
  Filled 2023-07-03: qty 30

## 2023-07-03 NOTE — Progress Notes (Signed)
Patient c/o of left chest pain radiating to left scapula;denies other symptoms, reports 7/10 pain. Vitals taken and WDL, Maalox administered as patient requested PRN;PA notified. Chest xray ordered

## 2023-07-03 NOTE — Progress Notes (Addendum)
PROGRESS NOTE   Subjective/Complaints:  Per nursing, confiscated 2 bottles of Afrin in his room/on his person- pt unhappy about this.   Also, per nurse, pt also took a full package of Benadryl over weekend- not clear, since is 3rd hand if this occurred.   Pt asking to wean pain meds- which I think is reasonable- asked him to ask less often for meds- since was taking 20 mg QID at home, don't want to reduce dosing to 15 mg  LBM yesterday- large  Thinks feels congested- but unhappy that "I didn't hear anything" on exam  ROS:   Pt denies SOB but had anxiety attack last night so c/o SOB last night, abd pain, CP, N/V/C/D, and vision changes   Except for HPI  Objective:   No results found. Recent Labs    07/02/23 0717  WBC 5.4  HGB 8.4*  HCT 27.7*  PLT 408*    Recent Labs    07/02/23 0717  NA 134*  K 5.4*  CL 104  CO2 25  GLUCOSE 142*  BUN 45*  CREATININE 1.34*  CALCIUM 8.5*     Intake/Output Summary (Last 24 hours) at 07/03/2023 0825 Last data filed at 07/03/2023 1610 Gross per 24 hour  Intake 420 ml  Output 2400 ml  Net -1980 ml     Pressure Injury 06/16/23 Sacrum Medial Stage 2 -  Partial thickness loss of dermis presenting as a shallow open injury with a red, pink wound bed without slough. small open area on coccyx in middle of gluteal cleft 1x1 cm (Active)  06/16/23 1848  Location: Sacrum  Location Orientation: Medial  Staging: Stage 2 -  Partial thickness loss of dermis presenting as a shallow open injury with a red, pink wound bed without slough.  Wound Description (Comments): small open area on coccyx in middle of gluteal cleft 1x1 cm  Present on Admission: Yes    Physical Exam: Vital Signs Blood pressure (!) 154/69, pulse (!) 55, temperature (!) 97.4 F (36.3 C), temperature source Oral, resp. rate 18, height 6\' 2"  (1.88 m), weight 97.2 kg, SpO2 95%.        General: awake, alert,  a  little  upset that Afrin was taken; NAD HENT: conjugate gaze; oropharynx moist CV: in afib- bradycardic rate; no JVD Pulmonary: CTA B/L; no W/R/R- good air movement- a little clearing of throat noted GI: soft, NT, ND, (+)BS Psychiatric: frustrated that might call family to discuss brining in meds-  Neurological: Ox3  Ext: no clubbing, cyanosis, or edema Psych: pleasant and cooperative  Neurological: Alert and oriented x 3. Normal insight and awareness. Intact Memory. Normal language and speech. Cranial nerve exam unremarkable.     Cervical back: Tenderness present.     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 Functional deficits which require 3+ hours per day of interdisciplinary therapy in a comprehensive inpatient rehab setting. Physiatrist is providing close team supervision and 24 hour management  of active medical problems listed below. Physiatrist and rehab team continue to assess barriers to discharge/monitor patient progress toward functional and medical goals  Care Tool:  Bathing    Body parts bathed by patient: Right arm, Left arm, Left upper leg, Chest, Front perineal area, Abdomen, Face, Buttocks, Right upper leg, Right lower leg, Left lower leg   Body parts bathed by helper: Right lower leg, Left lower leg     Bathing assist Assist Level: Supervision/Verbal cueing     Upper Body Dressing/Undressing Upper body dressing   What is the patient wearing?: Pull over shirt    Upper body assist Assist Level: Supervision/Verbal cueing    Lower Body Dressing/Undressing Lower body dressing      What is the patient wearing?: Underwear/pull up, Pants     Lower body assist Assist for lower body dressing: Supervision/Verbal cueing     Toileting Toileting     Toileting assist Assist for toileting: Contact Guard/Touching assist     Transfers Chair/bed transfer  Transfers assist     Chair/bed transfer assist level: Supervision/Verbal cueing     Locomotion Ambulation   Ambulation assist   Ambulation activity did not occur: Safety/medical concerns  Assist level: Contact Guard/Touching assist Assistive device: Walker-rolling Max distance: 100 ft   Walk 10 feet activity   Assist  Walk 10 feet activity did not occur: Safety/medical concerns  Assist level: Contact Guard/Touching assist Assistive device: Walker-rolling   Walk 50 feet activity   Assist Walk 50 feet with 2 turns activity did not occur: Safety/medical concerns  Assist level: Contact Guard/Touching assist Assistive device: Walker-rolling    Walk 150 feet activity   Assist Walk 150 feet activity did not occur: Safety/medical concerns         Walk 10 feet on uneven surface  activity   Assist Walk 10 feet on uneven surfaces activity did not occur: Safety/medical concerns         Wheelchair     Assist Is the patient using a wheelchair?: Yes Type of Wheelchair: Manual    Wheelchair assist level: Dependent - Patient 0%      Wheelchair 50 feet with 2 turns activity    Assist        Assist Level: Dependent - Patient 0%   Wheelchair 150 feet activity     Assist      Assist Level: Dependent - Patient 0%   Blood pressure (!) 154/69, pulse (!) 55, temperature (!) 97.4 F (36.3 C), temperature source Oral, resp. rate 18, height 6\' 2"  (1.88 m), weight 97.2 kg, SpO2 95%.  Medical Problem List and Plan: 1. Functional deficits secondary to  L intertrochanteric hip fx s/p IM nail WBAT             -patient may  shower             -ELOS/Goals: 10-14 days supervision             D/c 9/6  Con't CIR- PT and OT  Team conference today to f/u on progress.   Pt said willing to reduce pain meds- suggested take pain meds 5x/day not 6x/day.    Pt is on 15/7 since 8/20.  2.  Antithrombotics: -DVT/anticoagulation:  Pharmaceutical: Eliquis             -antiplatelet therapy: Plavix , change protonix to pepcid as this will help efficacy    3. 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   No complaints of pain today, continue current regimen  8/13- said pain not controlled from hour 3-4- but declined Oxycontin for pain-stopped last dose of Nabumetone- since Cr up so much  8/14- won't change pain meds since more confused per staff- pneumonia vs chronic? Pt asking again for q3 hours prn meds- explained will not do this.   8/15- pt agreed to keep pain meds aq4 hours- educated has been a little confused- pt agreed  8/19-8/20-  pt doing better, but wants to add Cosentyx- as below-   8/25- Cosentyx to be brought in from home--still waiting  8/26- pt said got Cosentyx- not clear in chart or on MAR. We discussed decreasing pain meds to 5x/day, not 6x/day, but he needs ot reduce times asking for it.   8/27- discussed again with pt and nursing- he hasn't reduced his amount of pain meds 4. Mood/Behavior/Sleep: LCSW to evaluate and provide emotional support             -antipsychotic agents: Vilazodone 40 mg daily             -continue Remeron 30 mg q HS             -continue Provigil 100 mg daily   8/16-   Vistaril to BID prn   5. Neuropsych/cognition: This patient is capable of making decisions on his own behalf.   6. Skin/Wound Care: Routine skin care checks             -sacral pressure injury;  continue local care/pressure relief             -monitor surgical incision             L elbow and R lower leg wounds- sutures out, glued 8/21- will get sutures out of L wrist and L hip is glued.  7. Fluids/Electrolytes/Nutrition: Routine Is and Os and follow-up chemistries             -continue Mag-ox 400 mg daily   8: Hypertension: monitor TID and prn -Continue Imdur 30 mg daily -continue Demadex 20 mg daily   Vitals:   07/03/23 0732 07/03/23 0735  BP:    Pulse:    Resp:    Temp:    SpO2: 94% 95%   Fair control 8/18  8/19-8/20 BP controlled- con't regimen  8/21- BP a little elevated this AM 158 systolic- usually 130s-150s- might benefit from hydrochlorothiazide?  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  8/13- CBGs running 199 to 265 in last 24 hours- since Semglee reduced. Don't feel comfortable adding PO meds since Cr up to 1.78 today  CBG (last 3)  Recent Labs    07/02/23 1651 07/02/23 2116 07/03/23 0620  GLUCAP 131* 115* 179*  Reduce semglee , cannot use metformin, also allergies preclude glimipride , consider jardiance, took metformin at home may restart if creat 1.2 or better   8/19- CBG's 122 to 249- needs something, but Jardiance cannot use due to hyperkalemia  and Metformin not appropriate due to Variable/labile Cr/BUN- Semglee was reduced yesterday- will give 24 hours to follow trend and treat hyperkalemia.   8/20- CBGs 110-233- will try Tradjenta 5 mg daily since not renally dosed  8/24-well controlled. might need ot change Tradjenta due to cost at d/c.   8/25 increased cbg last night likely d/t lower numbers during day and subsequent attempts to raise sugar--continue current regimen  8/26-8/27 BG's well controled in last 24 hours 11: Venous stasis: RLE pre-tibial ulcer; continue local wound care   12: S/p left IM nail Dr. [name]             -weight bearing at tolerated   13: Sleep apnea: CPAP at 17 nightly   14: Left rib fractures: pain control and pulmonary toilet   15: Paroxysmal atrial fib: on Eliquis, Imdur, Demadex (rate controlled no other meds at home)   8/12: EKG with normal ventricular rate, however in atrial flutter, new diagnosis.  Not on rate controlling medications, cardiology consulted for input.. Getting IVF as below. Patient otherwise stable.   8/14- not new per chart- had pAfib- although pt said not aware (poor STM)- con't since rate appears controlled OFF meds- actually running 50s to 90s  8/15- HR in 50s-60s- so not appropriate for rate meds.   8/19- HR running in the  60s 16: BPH: continue Flomax   17:Psoriasis/psoriatic arthritis: his family brought his Desonide 0.05% cream and clobetasol 0.05% cream to apply to hands, elbows, knees daily as needed             -If can get Cosentyx from home, can give to him- 2 weeks late, will make him hurt too much  8/11- educated pt don't have Cosentyx in hospital- will need ot bring form home  8/19- asked pt to have family bring in Cosentyx- think this will help his pain and risks of infection outweighed by benefits of pain reduction  8/20- family to bring in   8/23-  ? should be here soon  8/26- pt said he received- but I don't see if documented. 8/27- notes they did bring in, but  didn't give to nurse- just had pt take  18: Rosacea: his family brought in his Soolantra (ivermectin) 1% cream to apply to face daily as needed   8/11- ordered by pharmacy today  8/15- meds brought in and sent to pharmacy 19: s/p CABG 2023   20: Osteoarthritis: S/p left shoulder arthroplasty; needs the right shoulder done as well- has R frozen shoulder as a result.    21. 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    8/11- BUN up to 60- very dry- and Cr up some- will give IVFs 60cc/hour after therapy til tomorrow and recheck labs in AM  8/12: Creatinine still increasing 1.99, along with increased potassium and downtrending sodium; increase fluid rate today to 1 L IV fluid at 75/h, urine studies pending, repeat BMP at 2 PM to ensure no further worsening -if creatinine increasing or K increasing, will consult nephrology at that time.  May need bicarbonate.  - Urinalysis normal, urine sodium and serum osmolality appear prerenal, awaiting urine osmolality; K responsive to IVF, continue for 1L, Cr/BUN stable will repeat in AM  8/13- Wil give another 12 hours of NS IVFs 100cc/hour- Cr down to 1.78 and BUN still 84-   8/14- Cr 1.45 down from 1.78 and BUN 67 down from 84- will give 1 more 12 hours of IVFs and recheck in AM   8/16- labs came back late- Cr 1.35 and BUN 48- improving- con't to push PO fluids.   8/19- BUN down to 34 and Cr 1.24- much improved- con't to monitor weekly. Will give Lokelma for K+ 5.2- will recheck in AM  8/20- Got Lokelma but K+ up further to 5.3- spoke with pharmacy- will give Lokelma 10 G BID x 2 doses and give IVF NS- 75cc/hour x 16 hours to start  at 3pm- and recheck in AM- if K+ isn't down in AM, will call renal.   8/21- K+ 5.3 even after lokelma BID- we discussed that wait and recheck in AM- they aren't worried until >5.5- which makes sense- will recheck in AM and go  from there- might benefit from hydrochlorothiazide? Cr down to 1.18 and BUN still 41- pt drinking 2 cups/day of water- encouraged to drink 6-8 cups/day.   8/22- Cr down to 1.06 but BUN stable at 41- K+ 5.2 again- will not keep intervening since stable off Lokelma-   8/24-25 off IVFs and recheck labs Monday   8/26- K+ 5.4- and Cr back up to 1.35 and BUN 45- not drinking enough- will give Lokelma x2 and recheck in AM  8/27- BMP pending-  22. - Walked, but has Spina bifida with frequent falls- will d/w therapy.    23. Leukocytosis - resolved now on last day of abx   8/11- afebrile- feel good- will recheck in AM- if doesn't improve, will check U/A and Cx and CXR- having nurse check wounds as well- just to make sure-   -8-12: WBCs continue uptrending, 17.5 today, along with thrombocytosis and anemia.  Ordered urinalysis with reflex to culture, chest x-ray, initiate IV fluids as above.   Urinalysis within normal limits   Chest x-ray pending - RLL infiltrate, significant from last CXR, concerning for aspiration pneumonia. Start IV Zosyn 4.75 mg Q8H for 5 days, SLP consult placed for swallow eval. Labs in AM    8/13- WBC down to 10.7 from 17.5- doing much better- con't Zosyn for pneumonia- asked pt to use ICS q2 hours while awake.   Completed Zosyn afebrile  24. Constipation  8/15- Will give Sorbitol 30cc- if no large BM by then  8/16- Large BM yesterday AM before Sorbitol 8/19- LBM 2 days ago- is hard/feels constipated- will change Colace and Senna to Senna-S and increase to 2 tabs BID  8/22- LBM last night- large- greenish brown- not black 8/25 increased miralax to bid 8/24--had large bm yesterday -Continue senna-s 2  tab bid 8/26- LBM Saturday- will intervene again tomorrow if need be 8/27- large BM last night 25. Anemia  8/15- has dropped, however at the time it was higher, had been hemo-concentrated- and is now doing better- will monitor closely.   8/16- Hb back up to 8.8- doing better  8/22-  Last Hb 8.7- will monitor 26. Anal fissure  8/16- will order/d/w nursing meds from home for anal fissure- since we have in hospital.  27. Osteoporosis-   8/19- pt reports going ot start Fosamax, even before had fx- so needs it! D/w pt today and need for getting it started as soon as leaves.  28. Black stools  8/21- ordered hemoccult- hasn't had another BM yet. Also on Iron TID which could play a part- of note, Hb 8.7- was 8.8-9/1- but also is more hydrated- will recheck in AM-   8/23- no more black stools- will recheck Monday 29. Confusion/overtaking benadryl  8/22- got loopy and confused last night- taking benadryl 50 or more mg at a time including vistaril already ordered due to anxiety- will stop Benadryl- con't vistaril for now- educated pt cannot take home meds  8/27- let pt know no meds form home- taking from home again.  30. Anxiety  8/22- will add Buspar 5 mg TID for anxiety and can increase as required- no more benadryl!  8/23-24- pt reports anxiety is doing somewhat better with Buspar- likes it a lot. 8/26- Anxiety better overall- except used Vistaril last night x1.  8/27- still having SOB intermittently- which is his anxiety. Per nursing, took an entire package of benadryl in last few days- will call son/daughter to discuss.   I spent a total of 56   minutes on total care today- >50% coordination of care- due to  Calling family to discuss not brining meds. L/M Also team conference to f/u on progress;     LOS: 17 days A FACE TO FACE EVALUATION WAS PERFORMED  Naylani Bradner 07/03/2023, 8:25 AM    Addendum:

## 2023-07-03 NOTE — Significant Event (Signed)
Hypoglycemic Event  CBG: 61   Treatment: 8 oz juice/soda  Symptoms: None  Follow-up CBG: Time:1145  CBG Result:74   Possible Reasons for Event: Inadequate meal intake  Comments/MD notified:PA notified     Carrianne Hyun ATKINSO

## 2023-07-03 NOTE — Plan of Care (Signed)
Wound Plan   Braden Score: 17  Sensory: 3  Moisture: 3  Activity: 3  Mobility: 3  Nutrition: 3  Friction: 2   Wounds present:   Pressure Injury 06/16/23 Stage II to Sacrum (POA)   Healing 100% epithelialized  06/10/23 Left Hip Incision- glue (POA)  06/26/23 Skin tear to left arm- no drainage  06/09/23 Abrasion left anterior arm- unattached edges, no drainage (POA)  06/10/23 Skin tear to left anterior knee- no drainage (POA)  06/11/23 Wound to right anterior lower extremity- unattached edges, no drainage (POA)    Interventions:   Refusing air mattress.   Sarna lotion to arms and legs Clobetasol cream for hands, elbows, and knees for psoriasis Desowen Cream same as above Glucerna shake 3 x a day Multivitamin daily Nystatin cream to sacrum and buttocks Soolantra cream for rosacea Kenalog cream to sacrum and buttocks  Keep heels off bed  Wound care to right lower extremity, pretibial area (full thickness) and right hell callus:   Cleanse with NS, pat dry.  Cover affected area with size appropriate piece of folded xeroform (lawson # [phone]), top with dry gauze and secure with a few turns of kerlix roll gauze/paper tape.     Attendees/Contributors:   Vedia Pereyra, RN

## 2023-07-03 NOTE — Patient Care Conference (Signed)
Inpatient RehabilitationTeam Conference and Plan of Care Update Date: 07/03/2023   Time: 11:37 AM    Patient Name: Dylan Bruce      Medical Record Number: [id number]  Date of Birth: 06-05-1946 Sex: Male         Room/Bed: 4M01C/4M01C-01 Payor Info: Payor: Advertising copywriter / Plan: [employer] PPO / Product Type: *No Product type* /    Admit Date/Time:  06/16/2023  5:42 PM  Primary Diagnosis:  Closed left hip fracture, initial encounter [facility])  Hospital Problems: Principal Problem:   Closed left hip fracture, initial encounter (HCC) Active Problems:   Spina bifida (HCC)   S/p left hip fracture   Adjustment disorder    Expected Discharge Date: Expected Discharge Date: 07/09/23  Team Members Present: Physician leading conference: Dr. [name] Social Worker Present: Dossie Der, LCSW Nurse Present: Vedia Pereyra, RN PT Present: Truitt Leep, PT OT Present: Velia Meyer, OT PPS Coordinator present : Fae Pippin, SLP     Current Status/Progress Goal Weekly Team Focus  Bowel/Bladder   Pt is continent/ Incont of b/b LBM: 08/26   have more continence   timed toileting qshift and PRN    Swallow/Nutrition/ Hydration               ADL's   Bed mobility: SBA   Grooming: SBA seated EOB Toilet transfer: SBA with RW  Toileting: SBA, UB dressing: SBA , LB dressing: SBA using reacher to manage pants over Lt leg, able to complete figure 4 with RLE,  Footwear: SBA with socks aide  Shower transfer: SBA with RW  Bathing: SBA overall, using LHS and removable shower head  Transfers: overall SBA with RW   supervision overall   increased activity tolerance and dynamic standing    Mobility   CGA/S bed mobility, CGA/min A sit to stand + stand pivot, (S) gait x 150 ft with RW   (S) ambulatory goals  real life truck transfer with pt & his son    Communication                Safety/Cognition/ Behavioral Observations               Pain   pt states he has pain 8/10 L hip    pt denies pain   pain assessment qshift and PRN    Skin   Pt has skin tear, healing stage 2 coccyx, anal fissure, rash on L buttocks, and R abdomen with scratch marks   prevent further skin breakdown and promote healing  skin assessment qshift and PRN      Discharge Planning:  Family coming in this week to do hands on education in prepartion for discharge next week. Aware pt wil need supervision at discharge. Have arranged home health and will order DME once have recommendations   Team Discussion: Closed left hip fracture. Wound care plan in place. Stage II is epithelized. Found Afrin and Benadryl in room from home.  Family also brought in Cosentyx but never notified nursing. Potassium elevated.   BUN/Cr increasing.  Pain managed with PRN medications. Daily weights. CPAP at HS. Tolerating carb-mod diet. AC/HS.   Patient on target to meet rehab goals: yes, upgrading some goals with discharge date changed to 07/09/23  *See Care Plan and progress notes for long and short-term goals.   Revisions to Treatment Plan:  Air mattress removed per patient request. Lokelma given.  Monitor labs/VS.  Have pharmacy review medications to reduce or change times of medications for  compliance.  Teaching Needs: Medications, safety, self care, gait/transfer training,  skin/wound care, etc.   Current Barriers to Discharge: Wound care, Lack of/limited family support, and Medication compliance  Possible Resolutions to Barriers: Family education Independent with wound/skin care Compliant with medication regimen Order recommended DME     Medical Summary Current Status: removed air mattress per pt request; asking for meds from home- called family to have this stop;  Barriers to Discharge: Behavior/Mood;Weight bearing restrictions;Uncontrolled Pain;Self-care education;Electrolyte abnormality;Renal Insufficiency/Failure;Medical stability  Barriers to Discharge Comments: K+ down to 5.0 with Lokelma; AKI  getting worse- Cr 1.48 and BUN 44; Possible Resolutions to [employer] Focus: family ed and truck transfer- Friday- and Wednesday- upgrading goals- 9/6- move to 9/2   Continued Need for Acute Rehabilitation Level of Care: The patient requires daily medical management by a physician with specialized training in physical medicine and rehabilitation for the following reasons: Direction of a multidisciplinary physical rehabilitation program to maximize functional independence : Yes Medical management of patient stability for increased activity during participation in an intensive rehabilitation regime.: Yes Analysis of laboratory values and/or radiology reports with any subsequent need for medication adjustment and/or medical intervention. : Yes   I attest that I was present, lead the team conference, and concur with the assessment and plan of the team.   Jearld Adjutant 07/03/2023, 3:36 PM

## 2023-07-03 NOTE — Progress Notes (Signed)
Physical Therapy Session Note  Patient Details  Name: Dylan Bruce MRN: [id number] Date of Birth: 14-Apr-1946  Today's Date: 07/03/2023 PT Individual Time: 0900-1015 PT Individual Time Calculation (min): 75 min   Short Term Goals: Week 3:  PT Short Term Goal 1 (Week 3): Pt will require CGA with bed mobility PT Short Term Goal 2 (Week 3): Pt will require CGA consistently with sit<>stand's from various surface levels PT Short Term Goal 3 (Week 3): Pt will require CGA  with gait x 200 ft  Skilled Therapeutic Interventions/Progress Updates:      Therapy Documentation Precautions:  Precautions Precautions: Fall Restrictions Weight Bearing Restrictions: Yes LLE Weight Bearing: Weight bearing as tolerated  Pt received seated in TIS at bedside agreeable to PT session with emphasis on car transfer and gait training. Pt with 9/10 left hip pain, nurse notified and administered medication in session.   Pt (S) with sit to stand and gait with RW ~75 feet to ortho gym. Pt agreeable to practice simulated car transfer at edge of mat table with 4 inch step stool to simulate car floor board.    Pt utilized leg lifter with (S) for safety and increased time and repetitions to mobilize L LE over mat table to simulate car transfer.   Pt requested to try car simulator in ortho gym. Pt attempted to use leg lifter with similar technique as before but displayed difficulty therefore PT provided pt with leg loop for to assist with hip and knee flexion. With increased time and multiple attempts pt able to perform car transfer with (S).   Pt ambulated to room and performed L LE ROM exercises consisting of heel slides x 30 and hip abd/add x 15.   Pt left seated in recliner at bedside with all needs in reach and alarm on.  Therapy/Group: Individual Therapy  Truitt Leep Truitt Leep PT, DPT  07/03/2023, 8:40 AM

## 2023-07-03 NOTE — Progress Notes (Signed)
Occupational Therapy Session Note  Patient Details  Name: Dylan Bruce MRN: [id number] Date of Birth: Apr 01, 1946  Today's Date: 07/03/2023 OT Individual Time: 1445-1533 OT Individual Time Calculation (min): 48 min    Short Term Goals: Week 2:  OT Short Term Goal 1 (Week 2): Pt will complete LB dressing with Min A with AE as necessary OT Short Term Goal 1 - Progress (Week 2): Met OT Short Term Goal 2 (Week 2): Pt will complete grooming in standing with Min A OT Short Term Goal 2 - Progress (Week 2): Met OT Short Term Goal 3 (Week 2): Pt will complete footwear Max A OT Short Term Goal 3 - Progress (Week 2): Met  Skilled Therapeutic Interventions/Progress Updates:      Therapy Documentation Precautions:  Precautions Precautions: Fall Restrictions Weight Bearing Restrictions: Yes LLE Weight Bearing: Weight bearing as tolerated General: "I feel a little bit better." Pt seated EOB upon OT arrival, agreeable to OT session.  Pain:  8/10 pain reported in Lt hip, activity, intermittent rest breaks, distractions provided for pain management, pt reports tolerable to proceed.   ADL: Footwear: total A for time constraints for shoes LB dressing: Mod A d/t no AE in session, usually SBA with reacher Transfers: Pt ambulated with RW household distances SBA with slow pace and increased stride length with all mobility this date    Exercises: Pt completed 13 minutes of nu step bike in order to increase BUE/BLEstrength and endurance in preparation for increased independence in ADLs such as functional mobility. No Rest break on level 1 resistance. Slow consistent pace throughout exercise.    Other Treatments: Pt and OT discussed DME required fr D/C. Pt requires Ttb for tub/shower combo and 3-1 commode for over toilet. Pt initially feel as if he is not ready to go home. OT discussed significant improvements with ADLs and functional mobility and meeting OT goals at D/C d/t increased  strength/endurance and increased independence with ADLs. Pt reports feeling more confident after therapeutic discussion of D/C. Family trraining scheduled for tomorrow 8/28 and Friday 8/30 for education.  Pt supine in bed with bed alarm activated, 2 bed rails up, call light within reach and 4Ps assessed.   Therapy/Group: Individual Therapy  Velia Meyer, OTD, OTR/L 07/03/2023, 3:35 PM

## 2023-07-03 NOTE — Progress Notes (Signed)
Occupational Therapy Session Note  Patient Details  Name: Dylan Bruce MRN: [id number] Date of Birth: May 02, 1946  Today's Date: 07/03/2023 OT Individual Time: 4098-1191 OT Individual Time Calculation (min): 28 min    Short Term Goals: Week 3:  OT Short Term Goal 1 (Week 3): STG=LTG d/t ELOS  Skilled Therapeutic Interventions/Progress Updates:    Pt received supine with 6/10 pain, but reporting he wants to work through this and is willing to just use rest breaks as pain intervention. He came to EOB with (S) with extra time and use of bed rail. He stood from EOB with CGA. Mod A to don pants to thread over BLE d/t time constraints, but he was able to pull up in standing with CGA using the RW for intermittent UE support. Stand pivot with CGA. He completed oral care seated in the TIS with set up assist. Pt complaining of pain/tingling in the R median nerve distribution. Provided him a handout on median nerve gliding exercises and performed demonstration/teach back. Pt left in the w/c awaiting PT.   Therapy Documentation Precautions:  Precautions Precautions: Fall Restrictions Weight Bearing Restrictions: Yes LLE Weight Bearing: Weight bearing as tolerated  Therapy/Group: Individual Therapy  Crissie Reese 07/03/2023, 6:38 AM

## 2023-07-03 NOTE — Progress Notes (Signed)
Patient ID: PANTH HIEB, male   DOB: 04-Mar-1946, 77 y.o.   MRN: [id number]  Met with pt to update him regarding team conference regarding progress in therapies and actually reaching his goals sooner than 9/6 discharge date. The team and MD felt 9/2 pt would meet his goals by then. Pt feels he will not be ready then and will talk with MD and therapies when see today or early tomorrow am. Will update wife regarding team conference and moved up discharge date. Family to be here tomorrow and Friday for hands on training.

## 2023-07-03 NOTE — Progress Notes (Addendum)
Reports anterior chest pain at rest this afternoon. No dyspnea. Not exacerbated with left shoulder movement or inspiratory effort. VSS. HR 63. Resolved with Maalox. Admission H and P reported "left rib fracture". Will obtain follow-up PA and lateral chest x-ray. (Will also reach out to ortho surgery tomorrow regarding follow-up.)  Narrative & Impression  CLINICAL DATA:  Anterior chest pain   EXAM: CHEST - 2 VIEW   COMPARISON:  06/20/2023   FINDINGS: Frontal and lateral views of the chest are obtained on 3 images. 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 The cardiac silhouette is enlarged. There is increased vascular congestion with developing basilar predominant interstitial and ground-glass opacities. Trace bilateral effusions. No pneumothorax. Stable left shoulder arthroplasty.   IMPRESSION: 1. Worsening volume status, with progressive congestive heart failure.     Electronically Signed   By: Sharlet Salina M.D.   On: 07/03/2023 20:12

## 2023-07-04 LAB — GLUCOSE, CAPILLARY
Glucose-Capillary: 124 mg/dL — ABNORMAL HIGH (ref 70–99)
Glucose-Capillary: 138 mg/dL — ABNORMAL HIGH (ref 70–99)
Glucose-Capillary: 98 mg/dL (ref 70–99)
Glucose-Capillary: 110 mg/dL — ABNORMAL HIGH (ref 70–99)

## 2023-07-04 MED ORDER — METFORMIN HCL ER 500 MG PO TB24
500.0000 mg | ORAL_TABLET | Freq: Two times a day (BID) | ORAL | Status: AC
  Administered 2023-07-04 – 2023-07-06 (×6): 500 mg via ORAL
  Filled 2023-07-04 (×6): qty 1

## 2023-07-04 MED ORDER — EMPAGLIFLOZIN 10 MG PO TABS
10.0000 mg | ORAL_TABLET | Freq: Every day | ORAL | Status: DC
  Administered 2023-07-05 – 2023-07-10 (×6): 10 mg via ORAL
  Filled 2023-07-04 (×6): qty 1

## 2023-07-04 MED ORDER — METFORMIN HCL ER 500 MG PO TB24
1000.0000 mg | ORAL_TABLET | Freq: Two times a day (BID) | ORAL | Status: DC
  Administered 2023-07-07 – 2023-07-10 (×7): 1000 mg via ORAL
  Filled 2023-07-04 (×8): qty 2

## 2023-07-04 MED ORDER — FERROUS SULFATE 325 (65 FE) MG PO TABS
325.0000 mg | ORAL_TABLET | Freq: Every day | ORAL | Status: DC
  Administered 2023-07-05 – 2023-07-09 (×5): 325 mg via ORAL
  Filled 2023-07-04 (×6): qty 1

## 2023-07-04 MED ORDER — ATORVASTATIN CALCIUM 80 MG PO TABS
80.0000 mg | ORAL_TABLET | Freq: Every day | ORAL | Status: DC
  Administered 2023-07-05 – 2023-07-09 (×5): 80 mg via ORAL
  Filled 2023-07-04 (×5): qty 1

## 2023-07-04 MED ORDER — EZETIMIBE 10 MG PO TABS
10.0000 mg | ORAL_TABLET | Freq: Every day | ORAL | Status: DC
  Administered 2023-07-05 – 2023-07-09 (×5): 10 mg via ORAL
  Filled 2023-07-04 (×5): qty 1

## 2023-07-04 MED ORDER — BUSPIRONE HCL 15 MG PO TABS
15.0000 mg | ORAL_TABLET | Freq: Three times a day (TID) | ORAL | Status: DC
  Administered 2023-07-04 – 2023-07-10 (×19): 15 mg via ORAL
  Filled 2023-07-04 (×19): qty 1

## 2023-07-04 MED ORDER — TAMSULOSIN HCL 0.4 MG PO CAPS
0.4000 mg | ORAL_CAPSULE | Freq: Every day | ORAL | Status: DC
  Administered 2023-07-05 – 2023-07-09 (×5): 0.4 mg via ORAL
  Filled 2023-07-04 (×5): qty 1

## 2023-07-04 MED ORDER — ADULT MULTIVITAMIN W/MINERALS CH
1.0000 | ORAL_TABLET | Freq: Every day | ORAL | Status: DC
  Administered 2023-07-05 – 2023-07-09 (×5): 1 via ORAL
  Filled 2023-07-04 (×5): qty 1

## 2023-07-04 MED ORDER — HYDROCORTISONE ACETATE 25 MG RE SUPP
25.0000 mg | Freq: Two times a day (BID) | RECTAL | Status: DC | PRN
  Administered 2023-07-05: 25 mg via RECTAL
  Filled 2023-07-04 (×2): qty 1

## 2023-07-04 NOTE — Progress Notes (Signed)
Occupational Therapy Session Note  Patient Details  Name: Dylan Bruce MRN: [id number] Date of Birth: [birth year]  Today's Date: 07/04/2023 OT Individual Time: 1000-1045 OT Individual Time Calculation (min): 45 min    Short Term Goals: Week 3:  OT Short Term Goal 1 (Week 3): STG=LTG d/t ELOS  Skilled Therapeutic Interventions/Progress Updates:      Therapy Documentation Precautions:  Precautions Precautions: Fall Restrictions Weight Bearing Restrictions: Yes LLE Weight Bearing: Weight bearing as tolerated General: "I just need to get my meds straight" Pt seated in W/C upon OT arrival, agreeable to OT family education. Wife, son and daughter present for family ed.  Other Treatments: OT, pt, pt wife, son and daughter present for OT family education. Education provided with current level of assist, level of assist anticipated at D/C, OT DME recommendations (3-1 commode and TTB) and potentially installing GB in shower for safety. OT discussed medication management and providing pt with assistance in order to take medications at correct time throughout the day. OT educated on various medication management options such as setting alarms and purchasing medication organizers. OT discussed importance of taking medications at correct times in order to prevent complications such as compromising safety d/t adverse side effects. OT discussed HEP provided to pt. Pt discussed falling asleep while seated EOB while at home taking medications. OT discussed taking medications at scheduled routine time right before bed in order to prevent falls.Pt and family had no questions/ concerns after family education.   Pt seated in W/C at end of session with W/C alarm donned, call light within reach and 4Ps assessed.   Therapy/Group: Individual Therapy  Velia Meyer, OTD, OTR/L 07/04/2023, 2:00 PM

## 2023-07-04 NOTE — Progress Notes (Signed)
   07/04/23 0052  BiPAP/CPAP/SIPAP  $ Non-Invasive Home Ventilator  Subsequent  BiPAP/CPAP/SIPAP Pt Type Adult  BiPAP/CPAP/SIPAP  (home cpap)  Mask Type Nasal mask  Mask Size Medium  Respiratory Rate 18 breaths/min  EPAP 18 cmH2O  FiO2 (%) 21 %  Flow Rate 0 lpm  Patient Home Equipment Yes  Auto Titrate No  Safety Check Completed by RT for Home Unit Yes, no issues noted

## 2023-07-04 NOTE — Progress Notes (Signed)
Physical Therapy Session Note  Patient Details  Name: Dylan Bruce MRN: [id number] Date of Birth: 1945/11/26  Today's Date: 07/04/2023 PT Individual Time: 1115-1210, 1430-1530  Total Time, 55 min, 60 min      Short Term Goals: Week 3:  PT Short Term Goal 1 (Week 3): Pt will require CGA with bed mobility PT Short Term Goal 2 (Week 3): Pt will require CGA consistently with sit<>stand's from various surface levels PT Short Term Goal 3 (Week 3): Pt will require CGA  with gait x 200 ft  Skilled Therapeutic Interventions/Progress Updates:      Therapy Documentation Precautions:  Precautions Precautions: Fall Restrictions Weight Bearing Restrictions: Yes LLE Weight Bearing: Weight bearing as tolerated  Treatment Session 1:   Pt received seated in w/c at bedside with spouse, son and daughter present for family education/training. PT educated pt/family regarding pt's current mobility level and recommendation of supervision assist as pt with impaired safety awareness. Pt's family in agreement and reports they have looked into private pay options for support as pt's spouse works during the day. PT educated pt and family of current recommendation of RW to be utilized for all mobility to decrease fall risk upon discharge. PT plans to create HEP and educated pt and family to have pt mobilize and be active once every hour. Pt listened to patient and family's concerns and answered questions related to patient's mobility status. Current recommendation is for patient to discharge at supervision level with RW and HHPT. Plan for in person truck transfer on Friday from 10-12 with patient's son to trial prior to discharge. Pt without reports of pain and required (S) with sit<>stand for toileting, nurse notified of output. Pt left in care of family in w/c at bedside with plans to utilize grounds pass.    Treatment Session 2:   Pt received seated in recliner at bedside, agreeable to PT and without reports  of pain but did note fatigue at end of session. Pt (S) with gait with RW with intentions to ambulate to main gym. Pt reports urge for bowel movement and ambulated to ADL bathroom, pt continent of bowel and set-up for peri-care with tissue paper. Pt reported need to return to room for further peri-care with use of wash cloth. Pt ambulated ~300 ft in above situation and demonstrated improved standing activity tolerance. Pt then ambulated to main gym ~150 ft and required seated rest break due to fatigue. Pt reports he found family education session to be productive and is glad he will have supervision at discharge. Pt ambulated with RW (S) to room and left seated edge of bed with all needs in reach and alarm on. PT notified nurse of patient's position at end of session and of bowel movement.   Therapy/Group: Individual Therapy  Truitt Leep Truitt Leep PT, DPT  07/04/2023, 9:10 PM

## 2023-07-04 NOTE — Progress Notes (Signed)
PROGRESS NOTE   Subjective/Complaints:  Keeps having SOB/CP but appears due to anxiety, since he admits that's his Sx' of his anxiety.   Admits anxiety is " tons" and having "family issues"- doesn't want ot leave at all since afriad of falling and doesn't know how to handle that anxiety- explained ot pt will increase Buspar for anxiety, but we cannot keep longer since already Supervision and he will be mod I by d/c.   Says having 3-4 Bms/day per nursing Per nursing, taking pain meds q4 hours and wants q3 hours but knows has ot wean pain meds- since going home on q6 hours pain meds.    Per nursing, tried to take inhaler multiple times last night insead of letting 1 puff- see how it worked.   Will decrease iron to qday and sotp Colace since going frequently.  Pt insistent that I tell him  my opinion on bone density- however was to go on osteoporosis meds prior to hip fx, so I don't see how he has strong bones, I explained.  ROS:    Pt denies SOB, abd pain, CP, N/V/C/D, and vision changes   Except for HPI  Objective:   DG Chest 2 View  Result Date: 07/03/2023 CLINICAL DATA:  Anterior chest pain EXAM: CHEST - 2 VIEW COMPARISON:  06/20/2023 FINDINGS: Frontal and lateral views of the chest are obtained on 3 images. 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 The cardiac silhouette is enlarged. There is increased vascular congestion with developing basilar predominant interstitial and ground-glass opacities. Trace bilateral effusions. No pneumothorax. Stable left shoulder arthroplasty. IMPRESSION: 1. Worsening volume status, with progressive congestive heart failure. Electronically Signed   By: Sharlet Salina M.D.   On: 07/03/2023 20:12   Recent Labs    07/02/23 0717  WBC 5.4  HGB 8.4*  HCT 27.7*  PLT 408*    Recent Labs    07/02/23 0717 07/03/23 0741  NA 134* 136  K 5.4* 5.0   CL 104 107  CO2 25 23  GLUCOSE 142* 174*  BUN 45* 44*  CREATININE 1.34* 1.48*  CALCIUM 8.5* 8.4*     Intake/Output Summary (Last 24 hours) at 07/04/2023 0951 Last data filed at 07/04/2023 0700 Gross per 24 hour  Intake 813 ml  Output 2200 ml  Net -1387 ml     Pressure Injury 06/16/23 Sacrum Medial Stage 2 -  Partial thickness loss of dermis presenting as a shallow open injury with a red, pink wound bed without slough. small open area on coccyx in middle of gluteal cleft 1x1 cm (Active)  06/16/23 1848  Location: Sacrum  Location Orientation: Medial  Staging: Stage 2 -  Partial thickness loss of dermis presenting as a shallow open injury with a red, pink wound bed without slough.  Wound Description (Comments): small open area on coccyx in middle of gluteal cleft 1x1 cm  Present on Admission: Yes    Physical Exam: Vital Signs Blood pressure (!) 151/74, pulse 64, temperature 97.9 F (36.6 C), temperature source Oral, resp. rate 18, height 6\' 2"  (1.88 m), weight 97.2 kg, SpO2 98%.  General: awake, alert, very anxious and frustrated; sitting up in bed; NAD HENT: conjugate gaze; oropharynx dry with thick secretions at side of mouth B/L  form CPAP CV: regular rate in Afib;  no JVD Pulmonary: CTA B/L; no W/R/R- good air movement GI: soft, NT, ND, (+)BS Psychiatric: very anxious and angry that leaving early 9/2- says " he's being kicked out and abandoned". [place] Spaniel is afraid will fall, so cannot leave Neurological: Ox3  Ext: no clubbing, cyanosis, or edema Psych: pleasant and cooperative  Neurological: Alert and oriented x 3. Normal insight and awareness. Intact Memory. Normal language and speech. Cranial nerve exam unremarkable.     Cervical back: Tenderness present.     Comments: Psoriatic arthritis changes esp in hands    R shoulder has ~ 20-25 degrees AROM LUE shoulder has >90 degrees and 4+/5 in LUE RLE- HF proximally 3-/5 and sitally 4/5 LLE- HF 2-/5; KE 2/5;  and DF/PF 4/5-unchanged 8/18    Assessment/Plan: 1. Functional deficits which require 3+ hours per day of interdisciplinary therapy in a comprehensive inpatient rehab setting. Physiatrist is providing close team supervision and 24 hour management of active medical problems listed below. Physiatrist and rehab team continue to assess barriers to discharge/monitor patient progress toward functional and medical goals  Care Tool:  Bathing    Body parts bathed by patient: Right arm, Left arm, Left upper leg, Chest, Front perineal area, Abdomen, Face, Buttocks, Right upper leg, Right lower leg, Left lower leg   Body parts bathed by helper: Right lower leg, Left lower leg     Bathing assist Assist Level: Supervision/Verbal cueing     Upper Body Dressing/Undressing Upper body dressing   What is the patient wearing?: Pull over shirt    Upper body assist Assist Level: Supervision/Verbal cueing    Lower Body Dressing/Undressing Lower body dressing      What is the patient wearing?: Underwear/pull up, Pants     Lower body assist Assist for lower body dressing: Supervision/Verbal cueing     Toileting Toileting    Toileting assist Assist for toileting: Contact Guard/Touching assist     Transfers Chair/bed transfer  Transfers assist     Chair/bed transfer assist level: Supervision/Verbal cueing     Locomotion Ambulation   Ambulation assist   Ambulation activity did not occur: Safety/medical concerns  Assist level: Contact Guard/Touching assist Assistive device: Walker-rolling Max distance: 100 ft   Walk 10 feet activity   Assist  Walk 10 feet activity did not occur: Safety/medical concerns  Assist level: Contact Guard/Touching assist Assistive device: Walker-rolling   Walk 50 feet activity   Assist Walk 50 feet with 2 turns activity did not occur: Safety/medical concerns  Assist level: Contact Guard/Touching assist Assistive device: Walker-rolling     Walk 150 feet activity   Assist Walk 150 feet activity did not occur: Safety/medical concerns         Walk 10 feet on uneven surface  activity   Assist Walk 10 feet on uneven surfaces activity did not occur: Safety/medical concerns         Wheelchair     Assist Is the patient using a wheelchair?: Yes Type of Wheelchair: Manual    Wheelchair assist level: Dependent - Patient 0%      Wheelchair 50 feet with 2 turns activity    Assist        Assist Level: Dependent - Patient 0%   Wheelchair 150 feet activity     Assist  Assist Level: Dependent - Patient 0%   Blood pressure (!) 151/74, pulse 64, temperature 97.9 F (36.6 C), temperature source Oral, resp. rate 18, height 6\' 2"  (1.88 m), weight 97.2 kg, SpO2 98%.  Medical Problem List and Plan: 1. Functional deficits secondary to  L intertrochanteric hip fx s/p IM nail WBAT             -patient may  shower             -ELOS/Goals: 10-14 days supervision            D/c 9/2  Con't CIR PT and OT- spent 30 minute sinroom with pt due to anxiety this AM  Pt said willing to reduce pain meds- suggested take pain meds 5x/day not 6x/day. D/w pt again  Pt is on 15/7 since 8/20.  2.  Antithrombotics: -DVT/anticoagulation:  Pharmaceutical: Eliquis             -antiplatelet therapy: Plavix , change protonix to pepcid as this will help efficacy    3. 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   No complaints of pain today, continue current regimen  8/13- said  pain not controlled from hour 3-4- but declined Oxycontin for pain-stopped last dose of Nabumetone- since Cr up so much  8/14- won't change pain meds since more confused per staff- pneumonia vs chronic? Pt asking again for q3 hours prn meds- explained will not do this.   8/15- pt agreed to keep pain meds aq4 hours- educated has been a little confused- pt agreed  8/19-8/20-  pt doing better, but wants to add Cosentyx- as below-   8/25- Cosentyx to be brought in from home--still waiting  8/26- pt said got Cosentyx- not clear in chart or on MAR. We discussed decreasing pain meds to 5x/day, not 6x/day, but he needs ot reduce times asking for it.   8/27- discussed again with pt and nursing- he hasn't reduced his amount of pain meds  8/28- explained ot pt going home on q6 hours pain meds- will get 7 days of meds when he leaves- says doesn't have any pain meds- again asked him to reduce taking pain meds to q5 hours- since will be weaned next week. He voiced understanding  4. Mood/Behavior/Sleep: LCSW to evaluate and provide emotional support             -antipsychotic agents: Vilazodone 40 mg daily             -continue Remeron 30 mg q HS             -continue Provigil 100 mg daily   8/16-   Vistaril to BID prn   5. Neuropsych/cognition: This patient is capable of making decisions on his own behalf.   6. Skin/Wound Care: Routine skin care checks             -  sacral pressure injury; continue local care/pressure relief             -monitor surgical incision             L elbow and R lower leg wounds- sutures out, glued 8/21- will get sutures out of L wrist and L hip is glued.  7. Fluids/Electrolytes/Nutrition: Routine Is and Os and follow-up chemistries             -continue Mag-ox 400 mg daily   8: Hypertension: monitor TID and prn -Continue Imdur 30 mg daily -continue Demadex 20 mg daily   Vitals:   07/04/23 0348 07/04/23 0813  BP: (!) 151/74   Pulse:    Resp: 18   Temp: 97.9 F (36.6 C)    SpO2: 98% 98%   Fair control 8/18  8/19-8/20 BP controlled- con't regimen  8/21- BP a little elevated this AM 158 systolic- usually 130s-150s- might benefit from hydrochlorothiazide?  8/22- cannot add hydrochlorothiazide since allergic to Probenecid   8/25- BP under fair control--continue regimen  8/26- BP 140s systolci sometimes, but overall controlled  8/28- BP runs 100s to 150s- somewhat labile- don't want to increase BP meds when has low BP sometimes/soft.  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  8/13- CBGs running 199 to 265 in last 24 hours- since Semglee reduced. Don't feel comfortable adding PO meds since Cr up to 1.78 today  CBG (last 3)  Recent Labs    07/03/23 1642 07/03/23 2051 07/04/23 0610  GLUCAP 199* 138* 124*  Reduce semglee , cannot use metformin, also allergies preclude glimipride , consider jardiance, took metformin at home may restart if creat 1.2 or better   8/19- CBG's 122 to 249- needs something, but Jardiance cannot use due to hyperkalemia and Metformin not appropriate due to Variable/labile Cr/BUN- Semglee was reduced yesterday- will give 24 hours to follow trend and treat hyperkalemia.   8/20- CBGs 110-233- will try Tradjenta 5 mg daily since not renally dosed  8/24-well controlled. might need ot change Tradjenta due to cost at d/c.   8/25 increased cbg last night likely d/t lower numbers during day and subsequent attempts  to raise sugar--continue current regimen  8/26-8/27 BG's well controled in last 24 hours  8/28- CBGs OK- but eating a LOT of snacks per room where I see a lot of snacks.  11: Venous stasis: RLE pre-tibial ulcer; continue local wound care   12: S/p left IM nail Dr. [name]             -weight bearing at tolerated   13: Sleep apnea: CPAP at 17 nightly   14: Left rib fractures: pain control and pulmonary toilet   15: Paroxysmal atrial fib: on Eliquis, Imdur, Demadex (rate controlled no other meds at home)  8/12: EKG with normal ventricular rate, however in atrial flutter, new diagnosis.  Not on rate controlling medications, cardiology consulted for input.. Getting IVF as below. Patient otherwise stable.   8/14- not new per chart- had pAfib- although pt said not aware (poor STM)- con't since rate appears controlled OFF meds- actually running 50s to 90s  8/15- HR in 50s-60s- so not appropriate for rate meds.   8/19- HR running in the  60s 16: BPH: continue Flomax   17:Psoriasis/psoriatic arthritis: his family brought his Desonide 0.05% cream and clobetasol 0.05% cream to apply to hands, elbows, knees daily as needed             -If can get Cosentyx from home, can give to him- 2 weeks late, will make him hurt too much  8/11- educated pt don't have Cosentyx in hospital- will need ot bring form home  8/19- asked pt to have family bring in Cosentyx- think this will help his pain and risks of infection outweighed by benefits of pain reduction  8/20- family to bring in   8/23-  ? should be here soon  8/26- pt said he received- but I don't see if documented. 8/27- notes they did bring in, but didn't give to nurse- just had pt take dose.   18: Rosacea: his family brought in his Soolantra (ivermectin) 1% cream to apply to face daily as needed   8/11- ordered by pharmacy today  8/15- meds brought in and sent to pharmacy 19: s/p CABG 2023   20: Osteoarthritis: S/p left shoulder arthroplasty; needs  the right shoulder done as well- has R frozen shoulder as a result.    21. 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    8/11- BUN up to 60- very dry- and Cr up some- will give IVFs 60cc/hour after therapy til tomorrow and recheck labs in AM  8/12: Creatinine still increasing 1.99, along with increased potassium and downtrending sodium; increase fluid rate today to 1 L IV fluid at 75/h, urine studies pending, repeat BMP at 2 PM to ensure no further worsening -if creatinine increasing or K increasing, will consult nephrology at that time.  May need bicarbonate.  - Urinalysis normal, urine sodium and serum osmolality appear prerenal, awaiting urine osmolality; K responsive to IVF, continue for 1L, Cr/BUN stable will repeat in AM  8/13- Wil give another 12 hours of NS IVFs 100cc/hour- Cr down to 1.78 and BUN still 84-   8/14- Cr 1.45 down from 1.78 and BUN 67 down from 84- will give 1 more 12 hours of IVFs and recheck in AM   8/16- labs came back late- Cr 1.35 and BUN 48- improving- con't to push PO fluids.   8/19- BUN down to 34 and Cr 1.24- much improved- con't to monitor weekly. Will give Lokelma for K+ 5.2- will recheck in AM  8/20- Got Lokelma but K+ up further to 5.3- spoke with pharmacy- will give Lokelma 10 G BID x 2 doses and give IVF NS- 75cc/hour x 16 hours to start  at 3pm- and recheck in AM- if K+ isn't down in AM, will call renal.   8/21- K+ 5.3 even after lokelma BID- we discussed that wait and recheck in AM- they aren't worried until >5.5- which makes sense- will recheck in AM and go from there- might benefit from hydrochlorothiazide? Cr down to 1.18 and BUN still 41- pt drinking  2 cups/day of water- encouraged to drink 6-8 cups/day.   8/22- Cr down to 1.06 but BUN stable at 41- K+ 5.2 again- will not keep intervening since stable off Lokelma-   8/24-25 off IVFs and recheck labs  Monday   8/26- K+ 5.4- and Cr back up to 1.35 and BUN 45- not drinking enough- will give Lokelma x2 and recheck in AM  8/27- BMP pending-   8/28- BMP shows Cr 40f 1.48 and BUN 44- BUN stable- Cr up by 0.15 points- K+ 5.0- better after [facility]- will recheck Thursday 22. - Walked, but has Spina bifida with frequent falls- will d/w therapy.    23. Leukocytosis - resolved now on last day of abx   8/11- afebrile- feel good- will recheck in AM- if doesn't improve, will check U/A and Cx and CXR- having nurse check wounds as well- just to make sure-   -8-12: WBCs continue uptrending, 17.5 today, along with thrombocytosis and anemia.  Ordered urinalysis with reflex to culture, chest x-ray, initiate IV fluids as above.   Urinalysis within normal limits   Chest x-ray pending - RLL infiltrate, significant from last CXR, concerning for aspiration pneumonia. Start IV Zosyn 4.75 mg Q8H for 5 days, SLP consult placed for swallow eval. Labs in AM    8/13- WBC down to 10.7 from 17.5- doing much better- con't Zosyn for pneumonia- asked pt to use ICS q2 hours while awake.   Completed Zosyn afebrile  24. Constipation  8/15- Will give Sorbitol 30cc- if no large BM by then  8/16- Large BM yesterday AM before Sorbitol 8/19- LBM 2 days ago- is hard/feels constipated- will change Colace and Senna to Senna-S and increase to 2 tabs BID  8/22- LBM last night- large- greenish brown- not black 8/25 increased miralax to bid 8/24--had large bm yesterday -Continue senna-s 2 tab bid 8/26- LBM Saturday- will intervene again tomorrow if need be 8/27- large BM last night 8/28- since on Senna and miralax, and pt c/o too many meds, will stop Colace for now 25. Anemia  8/15- has dropped, however at the time it was higher, had been hemo-concentrated- and is now doing better- will monitor closely.   8/16- Hb back up to 8.8- doing better  8/22- Last Hb 8.7- will monitor 26. Anal fissure  8/16- will order/d/w nursing meds from home  for anal fissure- since we have in hospital.  27. Osteoporosis-   8/19- pt reports going ot start Fosamax, even before had fx- so needs it! D/w pt today and need for getting it started as soon as leaves. 8/28- pt asking about a bone density test- explained that will need to get PCP to order once got home- it's not done in hospital.  He was upset about this, but explained this is necessary since just had hip fx.  28. Black stools  8/21- ordered hemoccult- hasn't had another BM yet. Also on Iron TID which could play a part- of note, Hb 8.7- was 8.8-9/1- but also is more hydrated- will recheck in AM-   8/23- no more black stools- will recheck Monday 29. Confusion/overtaking benadryl  8/22- got loopy and confused last night- taking benadryl 50 or more mg at a time including vistaril already ordered due to anxiety- will stop Benadryl- con't vistaril for now- educated pt cannot take home meds  8/27- let pt know no meds from home- taking from home again.  30. Anxiety  8/22- will add Buspar 5 mg TID for anxiety and can  increase as required- no more benadryl!  8/23-24- pt reports anxiety is doing somewhat better with Buspar- likes it a lot. 8/26- Anxiety better overall- except used Vistaril last night x1.  8/27- still having SOB intermittently- which is his anxiety. Per nursing, took an entire package of benadryl in last few days- will call son/daughter to discuss.  8/28- had CP/SOB again last evening- sounds like was anxiety- tried to take inhaler 3-4x when given yesterday.  -will increase Buspar to 15 mg TID- will not give pt benzo's due to his pain meds chronically and my concern that keeps wanting to take large amounts of any medicine he treats his anxiety with.   I spent a total of 58    minutes on total care today- >50% coordination of care- due to  Due to d/w pt for 30 minutes this AM about his anxiety, his concern that he will not be ready to leave Monday- as well as d/w pharmacy 4 different times  and nursing about behavior.  LOS: 18 days A FACE TO FACE EVALUATION WAS PERFORMED  Lyndsay Talamante 07/04/2023, 9:51 AM    Addendum:

## 2023-07-04 NOTE — Progress Notes (Signed)
Recreational Therapy Session Note  Patient Details  Name: Dylan Bruce MRN: [id number] Date of Birth: 24-Jul-1946 Today's Date: 07/04/2023 No c/o pain.  Pt participated in animal assisted activity seated wc level with supervision.  Multiple family members present and participatory as well.  All easily engaged with pet partner team and was appreciative of this visit.  [name] 07/04/2023, 12:21 PM

## 2023-07-05 LAB — GLUCOSE, CAPILLARY
Glucose-Capillary: 135 mg/dL — ABNORMAL HIGH (ref 70–99)
Glucose-Capillary: 131 mg/dL — ABNORMAL HIGH (ref 70–99)
Glucose-Capillary: 71 mg/dL (ref 70–99)
Glucose-Capillary: 201 mg/dL — ABNORMAL HIGH (ref 70–99)

## 2023-07-05 MED ORDER — TORSEMIDE 20 MG PO TABS
20.0000 mg | ORAL_TABLET | Freq: Every day | ORAL | Status: AC
  Administered 2023-07-05 – 2023-07-06 (×2): 20 mg via ORAL
  Filled 2023-07-05: qty 1

## 2023-07-05 MED ORDER — INSULIN GLARGINE-YFGN 100 UNIT/ML ~~LOC~~ SOLN
20.0000 [IU] | Freq: Two times a day (BID) | SUBCUTANEOUS | Status: DC
  Administered 2023-07-05 – 2023-07-10 (×9): 20 [IU] via SUBCUTANEOUS
  Filled 2023-07-05 (×12): qty 0.2

## 2023-07-05 NOTE — Progress Notes (Signed)
Physical Therapy Session Note  Patient Details  Name: Dylan Bruce MRN: [id number] Date of Birth: May 15, 1946  Today's Date: 07/05/2023 PT Individual Time: 8295-6213 PT Individual Time Calculation (min): 30 min   Short Term Goals:  Week 3:  PT Short Term Goal 1 (Week 3): Pt will require CGA with bed mobility PT Short Term Goal 2 (Week 3): Pt will require CGA consistently with sit<>stand's from various surface levels PT Short Term Goal 3 (Week 3): Pt will require CGA  with gait x 200 ft  Skilled Therapeutic Interventions/Progress Updates:    Handoff from OT in the gym with patient edge of mat. Discussion and education about pain management, energy conservation, and upcoming d/c. Functional gait training on unit for overall mobility training and general strengthening and endurance x 24' with CGA with RW to tolerance but reporting hip pain limiting ability to go further today. Demonstrates improved foot clearance and focusing on "quality" vs "quantity." Transferred to recliner in room with CGA with RW, extra time for transitions due to pain and fatigue. pt directing appropriate positioning of pillows for pressure relief and increased sitting tolerance. All needs in reach.  Therapy Documentation Precautions:  Precautions Precautions: Fall Restrictions Weight Bearing Restrictions: Yes LLE Weight Bearing: Weight bearing as tolerated  Pain: 7/10 pain in hip - premedicated. Reports he over did it yesterday. Ice pack applied at end of session.     Therapy/Group: Individual Therapy  Karolee Stamps Darrol Poke, PT, DPT, CBIS  07/05/2023, 2:26 PM

## 2023-07-05 NOTE — Progress Notes (Addendum)
PROGRESS NOTE   Subjective/Complaints:  Pt reports he's scared of change, so doesn't want to go home, but understands he's physically ready by Monday to do so.  Pt reports family training done.  Not much of a difference with increase in Buspar.  LBM x2 yesterday- still somewhat loose.    ROS:    Pt denies SOB, abd pain, CP, N/V/C/D, and vision changes   Except for HPI  Objective:   DG Chest 2 View  Result Date: 07/03/2023 CLINICAL DATA:  Anterior chest pain EXAM: CHEST - 2 VIEW COMPARISON:  06/20/2023 FINDINGS: Frontal and lateral views of the chest are obtained on 3 images. 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 The cardiac silhouette is enlarged. There is increased vascular congestion with developing basilar predominant interstitial and ground-glass opacities. Trace bilateral effusions. No pneumothorax. Stable left shoulder arthroplasty. IMPRESSION: 1. Worsening volume status, with progressive congestive heart failure. Electronically Signed   By: Sharlet Salina M.D.   On: 07/03/2023 20:12   No results for input(s): "WBC", "HGB", "HCT", "PLT" in the last 72 hours.   Recent Labs    07/03/23 0741  NA 136  K 5.0  CL 107  CO2 23  GLUCOSE 174*  BUN 44*  CREATININE 1.48*  CALCIUM 8.4*     Intake/Output Summary (Last 24 hours) at 07/05/2023 0847 Last data filed at 07/05/2023 0700 Gross per 24 hour  Intake 286 ml  Output 2425 ml  Net -2139 ml     Pressure Injury 06/16/23 Sacrum Medial Stage 2 -  Partial thickness loss of dermis presenting as a shallow open injury with a red, pink wound bed without slough. small open area on coccyx in middle of gluteal cleft 1x1 cm (Active)  06/16/23 1848  Location: Sacrum  Location Orientation: Medial  Staging: Stage 2 -  Partial thickness loss of dermis presenting as a shallow open injury with a red, pink wound bed without slough.   Wound Description (Comments): small open area on coccyx in middle of gluteal cleft 1x1 cm  Present on Admission: Yes    Physical Exam: Vital Signs Blood pressure (!) 154/67, pulse 63, temperature 98.5 F (36.9 C), resp. rate 18, height 6\' 2"  (1.88 m), weight 97.5 kg, SpO2 99%.          General: awake, alert, appropriate, sitting EOB eating breakfast- 100% tray eaten;  NAD HENT: conjugate gaze; oropharynx moist CV: regular rate and in afib; no JVD Pulmonary: CTA B/L; no W/R/R- good air movement GI: soft, NT, ND, (+)BS- normoactive Psychiatric: appropriate but still very anxious- but less frustrated Neurological: Ox3  Ext: no clubbing, cyanosis, or edema Psych: pleasant and cooperative  Neurological: Alert and oriented x 3. Normal insight and awareness. Intact Memory. Normal language and speech. Cranial nerve exam unremarkable.     Cervical back: Tenderness present.     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 Functional deficits which require  3+ hours per day of interdisciplinary therapy in a comprehensive inpatient rehab setting. Physiatrist is providing close team supervision and 24 hour management of active medical problems listed below. Physiatrist and rehab team continue to assess barriers to discharge/monitor patient progress toward functional and medical goals  Care Tool:  Bathing    Body parts bathed by patient: Right arm, Left arm, Left upper leg, Chest, Front perineal area, Abdomen, Face, Buttocks, Right upper leg, Right lower leg, Left lower leg   Body parts bathed by helper: Right lower leg, Left lower leg     Bathing assist Assist Level: Supervision/Verbal cueing     Upper Body Dressing/Undressing Upper body dressing   What is the patient  wearing?: Pull over shirt    Upper body assist Assist Level: Supervision/Verbal cueing    Lower Body Dressing/Undressing Lower body dressing      What is the patient wearing?: Underwear/pull up, Pants     Lower body assist Assist for lower body dressing: Supervision/Verbal cueing     Toileting Toileting    Toileting assist Assist for toileting: Contact Guard/Touching assist     Transfers Chair/bed transfer  Transfers assist     Chair/bed transfer assist level: Supervision/Verbal cueing     Locomotion Ambulation   Ambulation assist   Ambulation activity did not occur: Safety/medical concerns  Assist level: Contact Guard/Touching assist Assistive device: Walker-rolling Max distance: 100 ft   Walk 10 feet activity   Assist  Walk 10 feet activity did not occur: Safety/medical concerns  Assist level: Contact Guard/Touching assist Assistive device: Walker-rolling   Walk 50 feet activity   Assist Walk 50 feet with 2 turns activity did not occur: Safety/medical concerns  Assist level: Contact Guard/Touching assist Assistive device: Walker-rolling    Walk 150 feet activity   Assist Walk 150 feet activity did not occur: Safety/medical concerns         Walk 10 feet on uneven surface  activity   Assist Walk 10 feet on uneven surfaces activity did not occur: Safety/medical concerns         Wheelchair     Assist Is the patient using a wheelchair?: Yes Type of Wheelchair: Manual    Wheelchair assist level: Dependent - Patient 0%      Wheelchair 50 feet with 2 turns activity    Assist        Assist Level: Dependent - Patient 0%   Wheelchair 150 feet activity     Assist      Assist Level: Dependent - Patient 0%   Blood pressure (!) 154/67, pulse 63, temperature 98.5 F (36.9 C), resp. rate 18, height 6\' 2"  (1.88 m), weight 97.5 kg, SpO2 99%.  Medical Problem List and Plan: 1. Functional deficits secondary to  L  intertrochanteric hip fx s/p IM nail WBAT             -patient may  shower             -ELOS/Goals: 10-14 days supervision            D/c 9/2  Con't CIR _ Spent a lot of time discussing his anxiety- and going home- and family issues- Got family training yesterday  Pt is on 15/7 since 8/20.  2.  Antithrombotics: -DVT/anticoagulation:  Pharmaceutical: Eliquis             -antiplatelet therapy: Plavix , change protonix to pepcid as this will help efficacy    3. 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   No complaints of pain today, continue current regimen  8/13- said pain not controlled from hour 3-4- but declined Oxycontin for pain-stopped last dose of Nabumetone- since Cr up so much  8/14- won't change pain meds since more confused per staff- pneumonia vs chronic? Pt asking again for q3 hours prn meds- explained will not do this.   8/15- pt agreed to keep pain meds aq4 hours- educated has been a little confused- pt agreed  8/19-8/20-  pt doing better, but wants to add Cosentyx- as below-   8/25- Cosentyx to be brought in from home--still waiting  8/26- pt said got Cosentyx- not clear in chart or on MAR. We discussed decreasing pain meds to 5x/day, not 6x/day, but he needs ot reduce times asking for it.   8/27- discussed again with pt and nursing- he hasn't reduced his amount of pain meds  8/28- explained ot pt going home on q6 hours pain meds- will get 7 days of meds when he leaves- says doesn't have any pain meds- again asked him to reduce taking  pain meds to q5 hours- since will be weaned next week. He voiced understanding  4. Mood/Behavior/Sleep: LCSW to evaluate and provide emotional support             -antipsychotic agents: Vilazodone 40 mg daily             -continue Remeron 30 mg q HS             -continue Provigil 100 mg daily   8/16-   Vistaril to BID prn   5. Neuropsych/cognition: This patient is capable of making decisions on his own behalf.   6. Skin/Wound Care: Routine skin care checks             -sacral pressure injury; continue local care/pressure relief             -monitor surgical incision             L elbow and R lower leg wounds- sutures out, glued 8/21- will get sutures out of L wrist and L hip is glued.  7. Fluids/Electrolytes/Nutrition: Routine Is and Os and follow-up chemistries             -continue Mag-ox 400 mg daily   8: Hypertension: monitor TID and prn -Continue Imdur 30 mg daily -continue Demadex 20 mg daily   Vitals:   07/05/23 0313 07/05/23 0359  BP: (!) 159/68 (!) 154/67  Pulse: 63 63  Resp: 18   Temp: 98.5 F (36.9 C)   SpO2: 97% 99%   Fair control 8/18  8/19-8/20 BP controlled- con't regimen  8/21- BP a little elevated this AM 158 systolic- usually 130s-150s- might benefit from hydrochlorothiazide?  8/22- cannot add hydrochlorothiazide since allergic to Probenecid   8/25- BP under fair control--continue  regimen  8/26- BP 140s systolci sometimes, but overall controlled  8/28- BP runs 100s to 150s- somewhat labile- don't want to increase BP meds when has low BP sometimes/soft.  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  8/13- CBGs running 199 to 265 in last 24 hours- since Semglee reduced. Don't feel comfortable adding PO meds since Cr up to 1.78 today  CBG (last 3)  Recent Labs    07/04/23 1623 07/04/23 2116 07/05/23 0607  GLUCAP 98 110* 135*  Reduce semglee , cannot use metformin, also allergies preclude glimipride , consider jardiance, took metformin at home may restart if creat 1.2 or better   8/19- CBG's 122 to 249- needs something, but Jardiance cannot use due to hyperkalemia and Metformin not appropriate due to Variable/labile Cr/BUN- Semglee was reduced yesterday- will give 24 hours to follow trend and treat hyperkalemia.   8/20- CBGs 110-233- will try Tradjenta 5 mg daily since not renally dosed  8/24-well controlled. might need ot change Tradjenta due to cost at d/c.   8/25 increased cbg last night likely d/t lower numbers during day and subsequent attempts to raise sugar--continue current regimen  8/26-8/27 BG's well controled in last 24 hours  8/28- CBGs OK- but eating a LOT of snacks per room where I see a lot of snacks.   8/29- decreased Insulin/Semglee to 20 units BID since such good control- don't want to drop too much- Also restarted metformin and Jardiance- per pharmacy recs.  11: Venous stasis: RLE pre-tibial ulcer; continue local wound care   12: S/p left IM nail Dr. [name]             -weight bearing at tolerated   13: Sleep apnea: CPAP at 17 nightly   14: Left rib fractures: pain control and pulmonary toilet   15: Paroxysmal atrial fib: on Eliquis, Imdur, Demadex (rate controlled no other meds at home)   8/12: EKG with normal ventricular rate, however in atrial flutter, new diagnosis.  Not on rate controlling medications, cardiology consulted for input.. Getting IVF as below. Patient otherwise stable.    8/14- not new per chart- had pAfib- although pt said not aware (poor STM)- con't since rate appears controlled OFF meds- actually running 50s to 90s  8/15- HR in 50s-60s- so not appropriate for rate meds.   8/19- HR running in the  60s 16: BPH: continue Flomax   17:Psoriasis/psoriatic arthritis: his family brought his Desonide 0.05% cream and clobetasol 0.05% cream to apply to hands, elbows, knees daily as needed             -If can get Cosentyx from home, can give to him- 2 weeks late, will make him hurt too much  8/11- educated pt don't have Cosentyx in hospital- will need ot bring  form home  8/19- asked pt to have family bring in Cosentyx- think this will help his pain and risks of infection outweighed by benefits of pain reduction  8/20- family to bring in   8/23-  ? should be here soon  8/26- pt said he received- but I don't see if documented. 8/27- notes they did bring in, but didn't give to nurse- just had pt take dose.   18: Rosacea: his family brought in his Soolantra (ivermectin) 1% cream to apply to face daily as needed   8/11- ordered by pharmacy today  8/15- meds brought in and sent to pharmacy 19: s/p CABG 2023   20: Osteoarthritis: S/p left shoulder arthroplasty; needs the right shoulder done as well- has R frozen shoulder as a result.    21. 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    8/11- BUN up to 60- very dry- and Cr up some- will give IVFs 60cc/hour after therapy til tomorrow and recheck labs in AM  8/12: Creatinine still increasing 1.99, along with increased potassium and downtrending sodium; increase fluid rate today to 1 L IV fluid at 75/h, urine studies pending, repeat BMP at 2 PM to ensure no further worsening -if creatinine increasing or K increasing, will consult nephrology at that time.  May need bicarbonate.  - Urinalysis normal, urine sodium and serum  osmolality appear prerenal, awaiting urine osmolality; K responsive to IVF, continue for 1L, Cr/BUN stable will repeat in AM  8/13- Wil give another 12 hours of NS IVFs 100cc/hour- Cr down to 1.78 and BUN still 84-   8/14- Cr 1.45 down from 1.78 and BUN 67 down from 84- will give 1 more 12 hours of IVFs and recheck in AM   8/16- labs came back late- Cr 1.35 and BUN 48- improving- con't to push PO fluids.   8/19- BUN down to 34 and Cr 1.24- much improved- con't to monitor weekly. Will give Lokelma for K+ 5.2- will recheck in AM  8/20- Got Lokelma but K+ up further to 5.3- spoke with pharmacy- will give Lokelma 10 G BID x 2 doses and give IVF NS- 75cc/hour x 16 hours to start  at 3pm- and recheck in AM- if K+ isn't down in AM, will call renal.   8/21- K+ 5.3 even after lokelma BID- we discussed that wait and recheck in AM- they aren't worried until >5.5- which makes sense- will recheck in AM and go from there- might benefit from hydrochlorothiazide? Cr down to 1.18 and BUN still 41- pt drinking 2 cups/day of water- encouraged to drink 6-8 cups/day.   8/22- Cr down to 1.06 but BUN stable at 41- K+ 5.2 again- will not keep intervening since stable off Lokelma-   8/24-25 off IVFs and recheck labs Monday   8/26- K+ 5.4- and Cr back up to 1.35 and BUN 45- not drinking enough- will give Lokelma x2 and recheck in AM  8/27- BMP pending-   8/28- BMP shows Cr 103f 1.48 and BUN 44- BUN stable- Cr up by 0.15 points- K+ 5.0- better after [facility]- will recheck Thursday  8/29- labs pending- ordered for this AM- not showing that have been drawn yet.  22. - Walked, but has Spina bifida with frequent falls- will d/w therapy.    23. Leukocytosis - resolved now on last day of abx   8/11- afebrile- feel good- will recheck in AM- if  doesn't improve, will check U/A and Cx and CXR- having nurse check wounds as well- just to make sure-   -8-12: WBCs continue uptrending, 17.5 today, along with thrombocytosis and anemia.  Ordered  urinalysis with reflex to culture, chest x-ray, initiate IV fluids as above.   Urinalysis within normal limits   Chest x-ray pending - RLL infiltrate, significant from last CXR, concerning for aspiration pneumonia. Start IV Zosyn 4.75 mg Q8H for 5 days, SLP consult placed for swallow eval. Labs in AM    8/13- WBC down to 10.7 from 17.5- doing much better- con't Zosyn for pneumonia- asked pt to use ICS q2 hours while awake.   Completed Zosyn afebrile  24. Constipation  8/15- Will give Sorbitol 30cc- if no large BM by then  8/16- Large BM yesterday AM before Sorbitol 8/19- LBM 2 days ago- is hard/feels constipated- will change Colace and Senna to Senna-S and increase to 2 tabs BID  8/22- LBM last night- large- greenish brown- not black 8/25 increased miralax to bid 8/24--had large bm yesterday -Continue senna-s 2 tab bid 8/26- LBM Saturday- will intervene again tomorrow if need be 8/27- large BM last night 8/28- since on Senna and miralax, and pt c/o too many meds, will stop Colace for now 8/29- had 2 Bms yesterday- not quite as loose- off Colace 25. Anemia  8/15- has dropped, however at the time it was higher, had been hemo-concentrated- and is now doing better- will monitor closely.   8/16- Hb back up to 8.8- doing better  8/22- Last Hb 8.7- will monitor 26. Anal fissure  8/16- will order/d/w nursing meds from home for anal fissure- since we have in hospital.  27. Osteoporosis-   8/19- pt reports going ot start Fosamax, even before had fx- so needs it! D/w pt today and need for getting it started as soon as leaves. 8/28- pt asking about a bone density test- explained that will need to get PCP to order once got home- it's not done in hospital.  He was upset about this, but explained this is necessary since just had hip fx.  8/29- asking what meds will take- explained was supposed to start Fosamax prior to admission, so will need to start when leaves.  28. Black stools  8/21- ordered  hemoccult- hasn't had another BM yet. Also on Iron TID which could play a part- of note, Hb 8.7- was 8.8-9/1- but also is more hydrated- will recheck in AM-   8/23- no more black stools- will recheck Monday 29. Confusion/overtaking benadryl  8/22- got loopy and confused last night- taking benadryl 50 or more mg at a time including vistaril already ordered due to anxiety- will stop Benadryl- con't vistaril for now- educated pt cannot take home meds  8/27- let pt know no meds from home- taking from home again.  30. Anxiety  8/22- will add Buspar 5 mg TID for anxiety and can increase as required- no more benadryl! 8/26- Anxiety better overall- except used Vistaril last night x1.  8/27- still having SOB intermittently- which is his anxiety. Per nursing, took an entire package of benadryl in last few days- will call son/daughter to discuss.  8/28- had CP/SOB again last evening- sounds like was anxiety- tried to take inhaler 3-4x when given yesterday.  -will increase Buspar to 15 mg TID- will not give pt benzo's due to his pain meds chronically and my concern that keeps wanting to take large amounts of any medicine he treats his anxiety with.  8/29- said anxiety still a major issue- and really limits him- but admits he "doesn't like change". Doesn't feel like Buspar helps/changes a lot.   I spent a total of 51   minutes on total care today- >50% coordination of care- due to prolonged d/w pt about anxiety/going home and d/w nursing about the same. Also titration of DM meds- and pending labs- will continue to recheck on labs today.   Addendum- will give more Demadex since worsening CXR- and has more vascular congestion.  Will recheck CXR in AM.   LOS: 19 days A FACE TO FACE EVALUATION WAS PERFORMED  Mycah Mcdougall 07/05/2023, 8:47 AM    Addendum:

## 2023-07-05 NOTE — Progress Notes (Signed)
Occupational Therapy Session Note  Patient Details  Name: Dylan Bruce MRN: [id number] Date of Birth: 1946/02/11  Today's Date: 07/05/2023 OT Individual Time: 4098-1191 OT Individual Time Calculation (min): 40 min    Short Term Goals: Week 1:  OT Short Term Goal 1 (Week 1): Pt will sit EOB while completing ADLs with Max A OT Short Term Goal 1 - Progress (Week 1): Met OT Short Term Goal 2 (Week 1): Pt will complete LB dressing with Max A with AE as necessary OT Short Term Goal 2 - Progress (Week 1): Met OT Short Term Goal 3 (Week 1): Pt will complete UB dressing with Mod A OT Short Term Goal 3 - Progress (Week 1): Met  Skilled Therapeutic Interventions/Progress Updates:     Pt received returning form BR with nursing staff. Pt presenting to be in good spirits receptive to skilled OT session reporting 4/10 pain in L LE with Pt stating "I think I over did it yesterday" - OT offering intermittent rest breaks, repositioning, and therapeutic support to optimize participation in therapy session. RN reporting too soon for Pt to have next dose of pain medications. Pt handed off to OT- completed functional mobility to wc using RW with CGA. Transported Pt total A to therapy gym in wc for time management, energy conservation, and pain management. Pt completed stand step wc>EOM using RW with CGA with 2 attempts required to power up to stand. Focused session on providing Pt UB HEP to increase overall strength, endurance, and maintain joint ROM for BADLs. Pt completed 10 reps of the following exercises with OT providing visual model of each movement and skilled feedback on technique:  -Pendulum Arm Circles -Wall glides  -Shoulder glides on table top (shoulder protraction/retraction) -Shoulder flexion with hands clasped -Posterior/anterior shoulder rolls -Arm circles on table top with shoulder circumduction clockwise/counter clockwise -Tricep extension with yellow therband  Provided handout of exercises  to support carryover at d/c.Pt receptive to all education provided and motivated to complete HEP upon d/c.  Pt directly handed off to PT in therapy gym with all needs met.    Therapy Documentation Precautions:  Precautions Precautions: Fall Restrictions Weight Bearing Restrictions: Yes LLE Weight Bearing: Weight bearing as tolerated  Therapy/Group: Individual Therapy  Clide Deutscher 07/05/2023, 3:47 PM

## 2023-07-05 NOTE — Progress Notes (Signed)
   07/05/23 2334  BiPAP/CPAP/SIPAP  $ Non-Invasive Ventilator  Non-Invasive Vent Subsequent  BiPAP/CPAP/SIPAP Pt Type Adult  BiPAP/CPAP/SIPAP Resmed  Mask Type Full face mask  Mask Size Medium  PEEP 18 cmH20  FiO2 (%) 21 %  Patient Home Equipment Yes  Auto Titrate Yes  CPAP/SIPAP surface wiped down Yes  Safety Check Completed by RT for Home Unit Yes, no issues noted  BiPAP/CPAP /SiPAP Vitals  Pulse Rate 66  Resp (!) 29  SpO2 97 %  Bilateral Breath Sounds Clear;Diminished  MEWS Score/Color  MEWS Score 2  MEWS Score Color Yellow

## 2023-07-05 NOTE — Progress Notes (Signed)
Physical Therapy Session Note  Patient Details  Name: Dylan Bruce MRN: [id number] Date of Birth: [birth year]  Today's Date: 07/05/2023 PT Individual Time: 1450-1533 PT Individual Time Calculation (min): 43 min   Short Term Goals: Week 2:  PT Short Term Goal 1 (Week 2): Pt will require min A with bed mobility PT Short Term Goal 1 - Progress (Week 2): Met PT Short Term Goal 2 (Week 2): Pt will require min A with sit to stand transfers with LRAD PT Short Term Goal 2 - Progress (Week 2): Met PT Short Term Goal 3 (Week 2): Pt will require min A with gait x 10 ft with LRAD PT Short Term Goal 3 - Progress (Week 2): Met  Skilled Therapeutic Interventions/Progress Updates:    Pt received in recliner and agreeable to therapy.  Pt reports 7/10 "getting ready to be 8" on arrival, premedicated. Rest and positioning provided as needed.   Pt reports he feels he overdid it yesterday and has been more painful today, so requesting shorter walks. Pt ambulated x 90 ft and x 140 ft with RW, CGA fading to supervision. Cues for upright posture and RW proximity.   Pt required min A and increased time when standing from recliner, but later performed x 5 Sit to stand from low mat table with supervision. Discussed how pt can instruct a caregiver if he needs to ask for help such as he did from the recliner.   Pt returned to bed after session with supervision and was left with all needs in reach and alarm active.   Therapy Documentation Precautions:  Precautions Precautions: Fall Restrictions Weight Bearing Restrictions: Yes LLE Weight Bearing: Weight bearing as tolerated General:       Therapy/Group: Individual Therapy  [name] 07/05/2023, 3:58 PM

## 2023-07-05 NOTE — Progress Notes (Signed)
Occupational Therapy Session Note  Patient Details  Name: Dylan Bruce MRN: [id number] Date of Birth: [birth year]  Today's Date: 07/05/2023 OT Individual Time: 1130-1200 OT Individual Time Calculation (min): 30 min    Short Term Goals: Week 1:  OT Short Term Goal 1 (Week 1): Pt will sit EOB while completing ADLs with Max A OT Short Term Goal 1 - Progress (Week 1): Met OT Short Term Goal 2 (Week 1): Pt will complete LB dressing with Max A with AE as necessary OT Short Term Goal 2 - Progress (Week 1): Met OT Short Term Goal 3 (Week 1): Pt will complete UB dressing with Mod A OT Short Term Goal 3 - Progress (Week 1): Met Week 2:  OT Short Term Goal 1 (Week 3): STG=LTG d/t ELOS  Skilled Therapeutic Interventions/Progress Updates:    1:1 Pt received in the recliner. Pt requested to walk today to continue to build up his endurance and strength. Pt able to come into standing with min A and ambulated at a slow rate from room to ADL apartment with one standing break. Pt sat on the low couch with min A for control decent into the chair. Pt rested and them required mod A pushing up from couch with both hands. Pt required contact guard with slow speed to return to room and get into recliner. Discussed home and transition home with support. Also pt reported he is getting a lift chair - encouraged him to try to limit the lifting assist to continue to get his LEs stronger and not loose the ability to get up from a sitting position. Pt left in recliner with safety belt donned; ice applied to left hip and tray in prep for lunch and call bell in hand.   Therapy Documentation Precautions:  Precautions Precautions: Fall Restrictions Weight Bearing Restrictions: Yes LLE Weight Bearing: Weight bearing as tolerated  Pain: Pt c/o pain in left hip and requested meds and got them at the end of the session.     Therapy/Group: Individual Therapy  Roney Mans [facility] 07/05/2023, 3:28 PM

## 2023-07-05 NOTE — Progress Notes (Signed)
Patient ID: Dylan Bruce, male   DOB: 1946-08-10, 77 y.o.   MRN: [id number]  Met with pt moving better and reported family education yesterday went well. Son coming with truck tomorrow to to an actual car transfer. Have ordered him a rolling walker and not sure what else needs, awaiting OT recommendations. Wife aware tub bench is private pay.

## 2023-07-06 ENCOUNTER — Inpatient Hospital Stay (HOSPITAL_COMMUNITY): Payer: Commercial Managed Care - PPO

## 2023-07-06 LAB — GLUCOSE, CAPILLARY
Glucose-Capillary: 110 mg/dL — ABNORMAL HIGH (ref 70–99)
Glucose-Capillary: 96 mg/dL (ref 70–99)
Glucose-Capillary: 157 mg/dL — ABNORMAL HIGH (ref 70–99)
Glucose-Capillary: 169 mg/dL — ABNORMAL HIGH (ref 70–99)

## 2023-07-06 LAB — BASIC METABOLIC PANEL
Anion gap: 10 (ref 5–15)
BUN: 37 mg/dL — ABNORMAL HIGH (ref 8–23)
CO2: 23 mmol/L (ref 22–32)
Calcium: 9.1 mg/dL (ref 8.9–10.3)
Chloride: 104 mmol/L (ref 98–111)
Creatinine, Ser: 1.3 mg/dL — ABNORMAL HIGH (ref 0.61–1.24)
GFR, Estimated: 57 mL/min — ABNORMAL LOW (ref >60)
Glucose, Bld: 113 mg/dL — ABNORMAL HIGH (ref 70–99)
Potassium: 5.6 mmol/L — ABNORMAL HIGH (ref 3.5–5.1)
Sodium: 137 mmol/L (ref 135–145)

## 2023-07-06 NOTE — Progress Notes (Signed)
Physical Therapy Session Note  Patient Details  Name: DEANTA COLANDREA MRN: [id number] Date of Birth: [birth year]  Today's Date: 07/06/2023 PT Individual Time: 0800-0845 PT Individual Time Calculation (min): 45 min   Short Term Goals: Week 2:  PT Short Term Goal 1 (Week 2): Pt will require min A with bed mobility PT Short Term Goal 1 - Progress (Week 2): Met PT Short Term Goal 2 (Week 2): Pt will require min A with sit to stand transfers with LRAD PT Short Term Goal 2 - Progress (Week 2): Met PT Short Term Goal 3 (Week 2): Pt will require min A with gait x 10 ft with LRAD PT Short Term Goal 3 - Progress (Week 2): Met  Skilled Therapeutic Interventions/Progress Updates:    Pt seated in w/c on arrival and agreeable to therapy. Pt reports high levels of pain in his LLE, requesting pain medication from nsg during session.   Assisted pt with donning shorts and shoes d/t the absence of AE.   Extended time spent on Sit to stand this session Pt requires min A to stand from TIS w/c or recliner d/t limitations in shoulder mobility preventing pushing from arms of chair successfully. Pt with increased time to initiate and difficulty creating anterior lean without push form surface. From bed and commode he was able to stand with supervision to RW without difficulty.   Pt ambulated to bathroom during session with close supervision-CGA and had continent BM. Supervision hygiene and min A clothing management in absence of reacher.   Pt remained in recliner at end of session, was left with all needs in reach and alarm active.   Therapy Documentation Precautions:  Precautions Precautions: Fall Restrictions Weight Bearing Restrictions: Yes LLE Weight Bearing: Weight bearing as tolerated General:       Therapy/Group: Individual Therapy  [name] 07/06/2023, 3:55 PM

## 2023-07-06 NOTE — Progress Notes (Signed)
Occupational Therapy Session Note  Patient Details  Name: Dylan Bruce MRN: [id number] Date of Birth: 02-08-1946  Today's Date: 07/06/2023 OT Individual Time: 1117-1206 OT Individual Time Calculation (min): 49 min    Short Term Goals: Week 3:  OT Short Term Goal 1 (Week 3): STG=LTG d/t ELOS  Skilled Therapeutic Interventions/Progress Updates:    Pt received supine with 8/10 c/o pain in his L hip, agreeable to OT session. OT did check on availability of pain medication, not due for another hour. Pt agreeable to shower as pain intervention. RLE occluded for shower, remainder of wounds left with foam dressing on and changed following. He came to EOB with increased time with (S). Sit > stand from elevated EOB with min A. Ambulatory transfer into the bathroom with min A using the RW. He transferred to the TTB with min cueing for sequencing. He was able to bathe UB and wash hair seated with (S). Encouraged use of LH sponge for distal LE. He completed sit > stand with heavy use of anterior grab bars and min A to stand. He required mod A to wash his bottom in standing. OT assisted in donning underwear/socks with total A 2/2 time constraints. He donned a shirt with (S). He returned to the recliner and was made comfortable with several pillows under BLE. Pt was left sitting up in the recliner with all needs met, chair alarm set, and call bell within reach. RN made aware of need to re-dress wounds.     Family not present for scheduled family education session today.    Therapy Documentation Precautions:  Precautions Precautions: Fall Restrictions Weight Bearing Restrictions: Yes LLE Weight Bearing: Weight bearing as tolerated  Therapy/Group: Individual Therapy  Crissie Reese 07/06/2023, 6:50 AM

## 2023-07-06 NOTE — Progress Notes (Signed)
Physical Therapy Session Note  Patient Details  Name: Dylan Bruce MRN: [id number] Date of Birth: 12-20-1945  Today's Date: 07/06/2023 PT Individual Time: 1000-1050 PT Individual Time Calculation (min): 50 min   Short Term Goals: Week 3:  PT Short Term Goal 1 (Week 3): Pt will require CGA with bed mobility PT Short Term Goal 2 (Week 3): Pt will require CGA consistently with sit<>stand's from various surface levels PT Short Term Goal 3 (Week 3): Pt will require CGA  with gait x 200 ft  Skilled Therapeutic Interventions/Progress Updates: Pt presented in bed agreeable to therapy agreeable to therapy. Pt states mild unrated pain at hips. Pt discussed how pain has been better managed and recognizes when he "over does it" and causes increased pain. Pt then completed supine to sit with minA (using PTA's hand for leverage to pull up to sitting). Pt requesting to use urinal prior to leaving room. Completed sit to stand from elevated surface with light CGA and completed urinary void in standing with close supervision. Pt then ambulated to ortho gym with CGA. Pt initially with step to pattern, decreased R foot clearance and shortened step length. With increased distance pt was able to improve gait mechanics to a more step through pattern and more pronounced heel strike. In ortho gym discussed car transfer as son was sick and was not able to compelte real vehicle transfer this am as planned. Per pt son has [employer] which upon research is approx 33in. Mat was elevated to 30in (maximum possible) and pt was able to back up, use small step as "runner" and sit on edge of mat to allow clearance to bring legs into "vehicle". Pt then ambulated back to room in same manner as prior with improved overall gait mechanics and requested to sit at EOB. Pt left EOB with bed alarm on, call bell within reach and visitor present.      Therapy Documentation Precautions:  Precautions Precautions:  Fall Restrictions Weight Bearing Restrictions: Yes LLE Weight Bearing: Weight bearing as tolerated General:   Vital Signs: Oxygen Therapy SpO2: 97 % O2 Device: Room Air Pain: Pain Assessment Pain Score: 7     Therapy/Group: Individual Therapy  Termaine Roupp 07/06/2023, 12:58 PM

## 2023-07-06 NOTE — Progress Notes (Signed)
Patient ID: Dylan Bruce, male   DOB: Mar 05, 1946, 77 y.o.   MRN: [id number]  Met with pt who reports his son is sick so not able to come in and do an actual car transfer. Informed him his discharge date is Monday he was thinking he was staying here. MD and team would be the ones to extend his stay. He is not sure he feels ready to go. He will address with therapies and MD. Discussed having son come in this weekend before discharge Monday

## 2023-07-06 NOTE — Progress Notes (Signed)
PROGRESS NOTE   Subjective/Complaints:  Pt reports slept OK Bowels less loose LBM yesterday Last night went better/ok with sleep and anxiety per pt.   ROS:    Pt denies SOB, abd pain, CP, N/V/C/D, and vision changes    Except for HPI  Objective:   DG CHEST PORT 1 VIEW  Result Date: 07/06/2023 CLINICAL DATA:  Pulmonary vascular congestion EXAM: PORTABLE CHEST 1 VIEW COMPARISON:  07/03/2023 FINDINGS: Increased interstitial markings, raising the possibility of mild interstitial edema. However, the mildly focal/patchy appearance in the right mid lung raises the possibility of mild infection/pneumonia. No pleural effusion or pneumothorax. Skin fold along the right lung apex and lateral left hemithorax. Cardiomegaly. 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 Median sternotomy. IMPRESSION: Cardiomegaly with possible mild edema. However, pneumonia is also possible, particularly in the right mid lung. No pleural effusions. Electronically Signed   By: Charline Bills M.D.   On: 07/06/2023 08:01   No results for input(s): "WBC", "HGB", "HCT", "PLT" in the last 72 hours.   Recent Labs    07/06/23 0719  NA 137  K 5.6*  CL 104  CO2 23  GLUCOSE 113*  BUN 37*  CREATININE 1.30*  CALCIUM 9.1     Intake/Output Summary (Last 24 hours) at 07/06/2023 1809 Last data filed at 07/06/2023 1738 Gross per 24 hour  Intake 956 ml  Output 1775 ml  Net -819 ml     Pressure Injury 06/16/23 Sacrum Medial Stage 2 -  Partial thickness loss of dermis presenting as a shallow open injury with a red, pink wound bed without slough. small open area on coccyx in middle of gluteal cleft 1x1 cm (Active)  06/16/23 1848  Location: Sacrum  Location Orientation: Medial  Staging: Stage 2 -  Partial thickness loss of dermis presenting as a shallow open injury with a red, pink wound bed without slough.  Wound Description (Comments): small  open area on coccyx in middle of gluteal cleft 1x1 cm  Present on Admission: Yes    Physical Exam: Vital Signs Blood pressure 117/60, pulse 69, temperature 99.8 F (37.7 C), temperature source Oral, resp. rate 16, height 6\' 2"  (1.88 m), weight 97.6 kg, SpO2 99%.           General: awake, alert, appropriate, sitting at sink doing grooming; NAD HENT: conjugate gaze; oropharynx moist CV: regular rate- a fib; no JVD Pulmonary: CTA B/L; no W/R/R- good air movement GI: soft, NT, ND, (+)BS Psychiatric: appropriate Neurological: Ox3  Ext: no clubbing, cyanosis, or edema Psych: pleasant and cooperative  Neurological: Alert and oriented x 3. Normal insight and awareness. Intact Memory. Normal language and speech. Cranial nerve exam unremarkable.     Cervical back: Tenderness present.     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 Functional deficits which require 3+ hours per day of interdisciplinary therapy in a comprehensive inpatient rehab setting. Physiatrist is providing close team supervision and 24  hour management of active medical problems listed below. Physiatrist and rehab team continue to assess barriers to discharge/monitor patient progress toward functional and medical goals  Care Tool:  Bathing    Body parts bathed by patient: Right arm, Left arm, Left upper leg, Chest, Front perineal area, Abdomen, Face, Buttocks, Right upper leg, Right lower leg, Left lower leg   Body parts bathed by helper: Right lower leg, Left lower leg     Bathing assist Assist Level: Supervision/Verbal cueing     Upper Body Dressing/Undressing Upper body dressing   What is the patient wearing?: Pull over shirt    Upper body assist Assist Level: Supervision/Verbal cueing     Lower Body Dressing/Undressing Lower body dressing      What is the patient wearing?: Underwear/pull up, Pants     Lower body assist Assist for lower body dressing: Supervision/Verbal cueing     Toileting Toileting    Toileting assist Assist for toileting: Contact Guard/Touching assist     Transfers Chair/bed transfer  Transfers assist     Chair/bed transfer assist level: Contact Guard/Touching assist     Locomotion Ambulation   Ambulation assist   Ambulation activity did not occur: Safety/medical concerns  Assist level: Contact Guard/Touching assist Assistive device: Walker-rolling Max distance: 65'   Walk 10 feet activity   Assist  Walk 10 feet activity did not occur: Safety/medical concerns  Assist level: Contact Guard/Touching assist Assistive device: Walker-rolling   Walk 50 feet activity   Assist Walk 50 feet with 2 turns activity did not occur: Safety/medical concerns  Assist level: Contact Guard/Touching assist Assistive device: Walker-rolling    Walk 150 feet activity   Assist Walk 150 feet activity did not occur: Safety/medical concerns         Walk 10 feet on uneven surface  activity   Assist Walk 10 feet on uneven surfaces activity did not occur: Safety/medical concerns         Wheelchair     Assist Is the patient using a wheelchair?: Yes Type of Wheelchair: Manual    Wheelchair assist level: Dependent - Patient 0%      Wheelchair 50 feet with 2 turns activity    Assist        Assist Level: Dependent - Patient 0%   Wheelchair 150 feet activity     Assist      Assist Level: Dependent - Patient 0%   Blood pressure 117/60, pulse 69, temperature 99.8 F (37.7 C), temperature source Oral, resp. rate 16, height 6\' 2"  (1.88 m), weight 97.6 kg, SpO2 99%.  Medical Problem List and Plan: 1. Functional deficits secondary to  L intertrochanteric hip fx s/p IM nail WBAT             -patient may   shower             -ELOS/Goals: 10-14 days supervision            D/c 9/2  Con't CIR PT and OT  Pt is on 15/7 since 8/20.  2.  Antithrombotics: -DVT/anticoagulation:  Pharmaceutical: Eliquis             -antiplatelet therapy: Plavix , change protonix to pepcid as this will help efficacy    3. 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   No complaints of pain today, continue current regimen  8/13- said pain not controlled from hour 3-4- but declined Oxycontin for pain-stopped last dose of Nabumetone- since Cr up so much  8/14- won't change pain meds since more confused per staff- pneumonia vs chronic? Pt asking again for q3 hours prn meds- explained will not do this.   8/15- pt agreed to keep pain meds aq4 hours- educated has been a little confused- pt agreed  8/19-8/20-  pt doing better, but wants to add Cosentyx- as below-   8/25- Cosentyx to be brought in from home--still waiting  8/26- pt said got Cosentyx- not clear in chart or on MAR. We discussed decreasing pain meds to 5x/day, not 6x/day, but he needs ot reduce times asking for it.   8/27- discussed again with pt and nursing- he hasn't reduced his amount of pain meds  8/28- explained ot pt going home on q6 hours pain meds- will get 7 days of meds when he leaves- says doesn't have any pain meds- again asked him to reduce taking pain meds to q5 hours- since will be weaned next week. He voiced understanding  4. Mood/Behavior/Sleep: LCSW to evaluate and provide emotional support              -antipsychotic agents: Vilazodone 40 mg daily             -continue Remeron 30 mg q HS             -continue Provigil 100 mg daily   8/16-   Vistaril to BID prn   5. Neuropsych/cognition: This patient is capable of making decisions on his own behalf.   6. Skin/Wound Care: Routine skin care checks             -sacral pressure injury; continue local care/pressure relief             -monitor surgical incision             L elbow and R lower leg wounds- sutures out, glued 8/21- will get sutures out of L wrist and L hip is glued.  7. Fluids/Electrolytes/Nutrition: Routine Is and Os and follow-up chemistries             -continue Mag-ox 400 mg daily   8: Hypertension: monitor TID and prn -Continue Imdur 30 mg daily -continue Demadex 20 mg daily   Vitals:   07/06/23 0937 07/06/23 1328  BP:  117/60  Pulse:  69  Resp:  16  Temp:  99.8 F (37.7 C)  SpO2: 97% 99%   Fair control 8/18  8/19-8/20 BP controlled- con't regimen  8/21- BP a little elevated this AM 158 systolic- usually 130s-150s- might benefit from hydrochlorothiazide?  8/22- cannot add hydrochlorothiazide since allergic to Probenecid   8/25- BP under fair control--continue regimen  8/26- BP 140s systolci sometimes, but overall controlled  8/28- BP runs 100s to 150s- somewhat labile- don't want to increase BP meds when has low BP sometimes/soft.  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  8/13- CBGs running 199 to 265 in last 24 hours- since Semglee reduced. Don't feel comfortable adding PO meds since Cr up to 1.78 today  CBG (last 3)  Recent Labs    07/06/23 0627 07/06/23 1130 07/06/23 1617  GLUCAP 110* 96 157*  Reduce semglee , cannot use metformin, also allergies preclude glimipride , consider jardiance, took metformin at home may restart if creat 1.2 or better   8/19- CBG's 122 to 249- needs something, but Jardiance cannot use due to hyperkalemia and Metformin not appropriate due to Variable/labile Cr/BUN- Semglee was reduced yesterday- will give 24 hours to follow trend and treat hyperkalemia.   8/20- CBGs 110-233- will try Tradjenta 5 mg daily since not renally dosed  8/24-well controlled. might need ot change Tradjenta due to cost at d/c.   8/25 increased cbg last night likely d/t lower numbers during day and subsequent attempts to raise sugar--continue current regimen  8/26-8/27 BG's well controled in last 24 hours  8/28- CBGs OK- but eating a LOT of snacks per room where I see a lot of snacks.   8/29- decreased Insulin/Semglee to 20 units BID since such good control- don't want to drop too much- Also restarted metformin and Jardiance- per pharmacy recs.   8/30- looking good-  11: Venous stasis: RLE pre-tibial ulcer; continue local wound care   12: S/p left IM nail Dr. [name]             -weight bearing at tolerated   13: Sleep apnea: CPAP at 17 nightly   14: Left rib fractures: pain control and pulmonary toilet   15: Paroxysmal atrial fib: on Eliquis, Imdur, Demadex (rate controlled no other meds at home)   8/12: EKG with normal ventricular rate, however in atrial flutter, new diagnosis.  Not on rate controlling medications, cardiology consulted for input.. Getting IVF as below. Patient otherwise stable.   8/14- not new per chart- had pAfib- although pt said not aware (poor STM)- con't since rate appears controlled OFF meds- actually running 50s to  90s  8/15- HR in 50s-60s- so not appropriate for rate meds.   8/19- HR running in the  60s 16: BPH: continue Flomax   17:Psoriasis/psoriatic arthritis: his family brought his Desonide 0.05% cream and clobetasol 0.05% cream to apply to hands, elbows, knees daily as needed             -If can get Cosentyx from home, can give to him- 2 weeks late, will make him hurt too much  8/11- educated pt don't have Cosentyx in hospital- will need ot bring form home  8/19- asked pt to have family bring in Cosentyx- think this will help his pain and risks of infection outweighed by benefits of pain reduction  8/20- family to bring  in   8/23-  ? should be here soon  8/26- pt said he received- but I don't see if documented. 8/27- notes they did bring in, but didn't give to nurse- just had pt take dose.   18: Rosacea: his family brought in his Soolantra (ivermectin) 1% cream to apply to face daily as needed   8/11- ordered by pharmacy today  8/15- meds brought in and sent to pharmacy 19: s/p CABG 2023   20: Osteoarthritis: S/p left shoulder arthroplasty; needs the right shoulder done as well- has R frozen shoulder as a result.    21. 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    8/11- BUN up to 60- very dry- and Cr up some- will give IVFs 60cc/hour after therapy til tomorrow and recheck labs in AM  8/12: Creatinine still increasing 1.99, along with increased potassium and downtrending sodium; increase fluid rate today to 1 L IV fluid at 75/h, urine studies pending, repeat BMP at 2 PM to ensure no further worsening -if creatinine increasing or K increasing, will consult nephrology at that time.  May need bicarbonate.  - Urinalysis normal, urine sodium and serum osmolality appear prerenal, awaiting urine osmolality; K responsive to IVF, continue for 1L, Cr/BUN stable will repeat in AM  8/13- Wil give  another 12 hours of NS IVFs 100cc/hour- Cr down to 1.78 and BUN still 84-   8/14- Cr 1.45 down from 1.78 and BUN 67 down from 84- will give 1 more 12 hours of IVFs and recheck in AM   8/16- labs came back late- Cr 1.35 and BUN 48- improving- con't to push PO fluids.   8/19- BUN down to 34 and Cr 1.24- much improved- con't to monitor weekly. Will give Lokelma for K+ 5.2- will recheck in AM  8/20- Got Lokelma but K+ up further to 5.3- spoke with pharmacy- will give Lokelma 10 G BID x 2 doses and give IVF NS- 75cc/hour x 16 hours to start  at 3pm- and recheck in AM- if K+ isn't down in AM, will call renal.   8/21- K+ 5.3 even after lokelma BID- we discussed that wait and recheck in AM- they aren't worried until >5.5- which makes sense- will recheck in AM and go from there- might benefit from hydrochlorothiazide? Cr down to 1.18 and BUN still 41- pt drinking 2 cups/day of water- encouraged to drink 6-8 cups/day.   8/22- Cr down to 1.06 but BUN stable at 41- K+ 5.2 again- will not keep intervening since stable off Lokelma-   8/24-25 off IVFs and recheck labs Monday   8/26- K+ 5.4- and Cr back up to 1.35 and BUN 45- not drinking enough- will give Lokelma x2 and recheck in AM  8/27- BMP pending-   8/28- BMP shows Cr 46f 1.48 and BUN 44- BUN stable- Cr up by 0.15 points- K+ 5.0- better after [facility]- will recheck Thursday  8/29- labs pending- ordered for this AM- not showing that have been drawn yet.   8/30- never drawn yesterday- this AM BUN 37 and Cr 1.30- in spite of receiving Demadex yesterday- K+ 5.6- will give Lokelma-  22. - Walked, but has Spina bifida with frequent falls- will d/w therapy.    23. Leukocytosis - resolved now on last day of abx   8/11- afebrile- feel good- will recheck in AM- if doesn't improve, will check U/A and Cx and CXR-  having nurse check wounds as well- just to make sure-   -8-12: WBCs continue uptrending, 17.5 today, along with thrombocytosis and anemia.  Ordered urinalysis  with reflex to culture, chest x-ray, initiate IV fluids as above.   Urinalysis within normal limits   Chest x-ray pending - RLL infiltrate, significant from last CXR, concerning for aspiration pneumonia. Start IV Zosyn 4.75 mg Q8H for 5 days, SLP consult placed for swallow eval. Labs in AM    8/13- WBC down to 10.7 from 17.5- doing much better- con't Zosyn for pneumonia- asked pt to use ICS q2 hours while awake.   Completed Zosyn afebrile  24. Constipation  8/15- Will give Sorbitol 30cc- if no large BM by then  8/16- Large BM yesterday AM before Sorbitol 8/19- LBM 2 days ago- is hard/feels constipated- will change Colace and Senna to Senna-S and increase to 2 tabs BID  8/22- LBM last night- large- greenish brown- not black 8/25 increased miralax to bid 8/24--had large bm yesterday -Continue senna-s 2 tab bid 8/26- LBM Saturday- will intervene again tomorrow if need be 8/27- large BM last night 8/28- since on Senna and miralax, and pt c/o too many meds, will stop Colace for now 8/29- had 2 Bms yesterday- not quite as loose- off Colace 8/30- doing better-still going daily 25. Anemia  8/15- has dropped, however at the time it was higher, had been hemo-concentrated- and is now doing better- will monitor closely.   8/16- Hb back up to 8.8- doing better  8/22- Last Hb 8.7- will monitor 26. Anal fissure  8/16- will order/d/w nursing meds from home for anal fissure- since we have in hospital.  27. Osteoporosis-   8/19- pt reports going ot start Fosamax, even before had fx- so needs it! D/w pt today and need for getting it started as soon as leaves. 8/28- pt asking about a bone density test- explained that will need to get PCP to order once got home- it's not done in hospital.  He was upset about this, but explained this is necessary since just had hip fx.  8/29- asking what meds will take- explained was supposed to start Fosamax prior to admission, so will need to start when leaves.  28. Black  stools  8/21- ordered hemoccult- hasn't had another BM yet. Also on Iron TID which could play a part- of note, Hb 8.7- was 8.8-9/1- but also is more hydrated- will recheck in AM-   8/23- no more black stools- will recheck Monday 29. Confusion/overtaking benadryl  8/22- got loopy and confused last night- taking benadryl 50 or more mg at a time including vistaril already ordered due to anxiety- will stop Benadryl- con't vistaril for now- educated pt cannot take home meds  8/27- let pt know no meds from home- taking from home again.  30. Anxiety  8/22- will add Buspar 5 mg TID for anxiety and can increase as required- no more benadryl! 8/26- Anxiety better overall- except used Vistaril last night x1.  8/27- still having SOB intermittently- which is his anxiety. Per nursing, took an entire package of benadryl in last few days- will call son/daughter to discuss.  8/28- had CP/SOB again last evening- sounds like was anxiety- tried to take inhaler 3-4x when given yesterday.  -will increase Buspar to 15 mg TID- will not give pt benzo's due to his pain meds chronically and my concern that keeps wanting to take large amounts of any medicine he treats his anxiety with.   8/29- said  anxiety still a major issue- and really limits him- but admits he "doesn't like change". Doesn't feel like Buspar helps/changes a lot.  30- Pulmonary edema vs infiltrate  8/30- will check CBC and BMP in AM to see if has leukocytosis and to f/u after 2nd dose of Demadex    I spent a total of  41  minutes on total care today- >50% coordination of care- due to  Review of labs, vitals, and complex medical decision making.    LOS: 20 days A FACE TO FACE EVALUATION WAS PERFORMED  Otto Caraway 07/06/2023, 6:09 PM    Addendum:

## 2023-07-07 DIAGNOSIS — E875 Hyperkalemia: Secondary | ICD-10-CM

## 2023-07-07 LAB — CBC WITH DIFFERENTIAL/PLATELET
Abs Immature Granulocytes: 0.06 10*3/uL (ref 0.00–0.07)
Abs Immature Granulocytes: 0.05 10*3/uL (ref 0.00–0.07)
Basophils Absolute: 0 10*3/uL (ref 0.0–0.1)
Basophils Absolute: 0 10*3/uL (ref 0.0–0.1)
Basophils Relative: 0 %
Basophils Relative: 0 %
Eosinophils Absolute: 0.1 10*3/uL (ref 0.0–0.5)
Eosinophils Absolute: 0 10*3/uL (ref 0.0–0.5)
Eosinophils Relative: 1 %
Eosinophils Relative: 0 %
HCT: 28.9 % — ABNORMAL LOW (ref 39.0–52.0)
HCT: 28 % — ABNORMAL LOW (ref 39.0–52.0)
Hemoglobin: 8.8 g/dL — ABNORMAL LOW (ref 13.0–17.0)
Hemoglobin: 8.6 g/dL — ABNORMAL LOW (ref 13.0–17.0)
Immature Granulocytes: 1 %
Immature Granulocytes: 1 %
Lymphocytes Relative: 10 %
Lymphocytes Relative: 7 %
Lymphs Abs: 0.9 10*3/uL (ref 0.7–4.0)
Lymphs Abs: 0.7 10*3/uL (ref 0.7–4.0)
MCH: 24.8 pg — ABNORMAL LOW (ref 26.0–34.0)
MCH: 24.6 pg — ABNORMAL LOW (ref 26.0–34.0)
MCHC: 30.4 g/dL (ref 30.0–36.0)
MCHC: 30.7 g/dL (ref 30.0–36.0)
MCV: 81.4 fL (ref 80.0–100.0)
MCV: 80.2 fL (ref 80.0–100.0)
Monocytes Absolute: 1.4 10*3/uL — ABNORMAL HIGH (ref 0.1–1.0)
Monocytes Absolute: 1.4 10*3/uL — ABNORMAL HIGH (ref 0.1–1.0)
Monocytes Relative: 15 %
Monocytes Relative: 15 %
Neutro Abs: 6.8 10*3/uL (ref 1.7–7.7)
Neutro Abs: 7.3 10*3/uL (ref 1.7–7.7)
Neutrophils Relative %: 73 %
Neutrophils Relative %: 77 %
Platelets: 328 10*3/uL (ref 150–400)
Platelets: 310 10*3/uL (ref 150–400)
RBC: 3.55 MIL/uL — ABNORMAL LOW (ref 4.22–5.81)
RBC: 3.49 MIL/uL — ABNORMAL LOW (ref 4.22–5.81)
RDW: 20.2 % — ABNORMAL HIGH (ref 11.5–15.5)
RDW: 20.1 % — ABNORMAL HIGH (ref 11.5–15.5)
WBC: 9.3 10*3/uL (ref 4.0–10.5)
WBC: 9.5 10*3/uL (ref 4.0–10.5)
nRBC: 0 % (ref 0.0–0.2)
nRBC: 0 % (ref 0.0–0.2)

## 2023-07-07 LAB — URINALYSIS, W/ REFLEX TO CULTURE (INFECTION SUSPECTED)
Bacteria, UA: NONE SEEN
Bilirubin Urine: NEGATIVE
Glucose, UA: 50 mg/dL — AB
Hgb urine dipstick: NEGATIVE
Ketones, ur: NEGATIVE mg/dL
Leukocytes,Ua: NEGATIVE
Nitrite: NEGATIVE
Protein, ur: NEGATIVE mg/dL
Specific Gravity, Urine: 1.011 (ref 1.005–1.030)
pH: 5 (ref 5.0–8.0)

## 2023-07-07 LAB — BASIC METABOLIC PANEL
Anion gap: 10 (ref 5–15)
Anion gap: 10 (ref 5–15)
Anion gap: 13 (ref 5–15)
BUN: 42 mg/dL — ABNORMAL HIGH (ref 8–23)
BUN: 43 mg/dL — ABNORMAL HIGH (ref 8–23)
BUN: 49 mg/dL — ABNORMAL HIGH (ref 8–23)
CO2: 23 mmol/L (ref 22–32)
CO2: 23 mmol/L (ref 22–32)
CO2: 22 mmol/L (ref 22–32)
Calcium: 8.9 mg/dL (ref 8.9–10.3)
Calcium: 9.1 mg/dL (ref 8.9–10.3)
Calcium: 8.7 mg/dL — ABNORMAL LOW (ref 8.9–10.3)
Chloride: 103 mmol/L (ref 98–111)
Chloride: 102 mmol/L (ref 98–111)
Chloride: 100 mmol/L (ref 98–111)
Creatinine, Ser: 1.42 mg/dL — ABNORMAL HIGH (ref 0.61–1.24)
Creatinine, Ser: 1.42 mg/dL — ABNORMAL HIGH (ref 0.61–1.24)
Creatinine, Ser: 1.64 mg/dL — ABNORMAL HIGH (ref 0.61–1.24)
GFR, Estimated: 52 mL/min — ABNORMAL LOW (ref >60)
GFR, Estimated: 52 mL/min — ABNORMAL LOW (ref >60)
GFR, Estimated: 43 mL/min — ABNORMAL LOW (ref >60)
Glucose, Bld: 156 mg/dL — ABNORMAL HIGH (ref 70–99)
Glucose, Bld: 147 mg/dL — ABNORMAL HIGH (ref 70–99)
Glucose, Bld: 158 mg/dL — ABNORMAL HIGH (ref 70–99)
Potassium: 6.2 mmol/L — ABNORMAL HIGH (ref 3.5–5.1)
Potassium: 5.6 mmol/L — ABNORMAL HIGH (ref 3.5–5.1)
Potassium: 5.8 mmol/L — ABNORMAL HIGH (ref 3.5–5.1)
Sodium: 136 mmol/L (ref 135–145)
Sodium: 135 mmol/L (ref 135–145)
Sodium: 135 mmol/L (ref 135–145)

## 2023-07-07 LAB — GLUCOSE, CAPILLARY
Glucose-Capillary: 210 mg/dL — ABNORMAL HIGH (ref 70–99)
Glucose-Capillary: 180 mg/dL — ABNORMAL HIGH (ref 70–99)
Glucose-Capillary: 94 mg/dL (ref 70–99)
Glucose-Capillary: 176 mg/dL — ABNORMAL HIGH (ref 70–99)
Glucose-Capillary: 46 mg/dL — ABNORMAL LOW (ref 70–99)
Glucose-Capillary: 86 mg/dL (ref 70–99)

## 2023-07-07 LAB — MAGNESIUM: Magnesium: 2.2 mg/dL (ref 1.7–2.4)

## 2023-07-07 MED ORDER — TORSEMIDE 20 MG PO TABS
20.0000 mg | ORAL_TABLET | Freq: Once | ORAL | Status: AC
  Administered 2023-07-07: 20 mg via ORAL
  Filled 2023-07-07: qty 1

## 2023-07-07 MED ORDER — SODIUM ZIRCONIUM CYCLOSILICATE 5 G PO PACK
5.0000 g | PACK | Freq: Every day | ORAL | Status: DC
  Administered 2023-07-07 – 2023-07-10 (×4): 5 g via ORAL
  Filled 2023-07-07 (×4): qty 1

## 2023-07-07 MED ORDER — SODIUM CHLORIDE 0.9 % IV SOLN: INTRAVENOUS | Status: DC

## 2023-07-07 MED ORDER — ACETAMINOPHEN 500 MG PO TABS
500.0000 mg | ORAL_TABLET | ORAL | Status: DC | PRN
  Administered 2023-07-08 – 2023-07-10 (×6): 500 mg via ORAL
  Filled 2023-07-07 (×6): qty 1

## 2023-07-07 MED ORDER — LEVALBUTEROL HCL 0.63 MG/3ML IN NEBU
0.6300 mg | INHALATION_SOLUTION | Freq: Four times a day (QID) | RESPIRATORY_TRACT | Status: DC | PRN
  Administered 2023-07-07 – 2023-07-08 (×3): 0.63 mg via RESPIRATORY_TRACT
  Filled 2023-07-07 (×3): qty 3

## 2023-07-07 NOTE — Progress Notes (Signed)
Physical Therapy Session Note  Patient Details  Name: Dylan Bruce MRN: [id number] Date of Birth: 1945-11-12  Today's Date: 07/07/2023 PT Individual Time: 1002-1042 PT Individual Time Calculation (min): 40 min   Short Term Goals: Week 3:  PT Short Term Goal 1 (Week 3): Pt will require CGA with bed mobility PT Short Term Goal 2 (Week 3): Pt will require CGA consistently with sit<>stand's from various surface levels PT Short Term Goal 3 (Week 3): Pt will require CGA  with gait x 200 ft  Skilled Therapeutic Interventions/Progress Updates:    Chart reviewed and pt agreeable to therapy. Pt received seated in recliner with 8/10 c/o pain in hip. Session focused on functional transfers and breathing techniques to support safe return to home access and relief from respiratory symptoms. Pt initiated session with verbal cue to stand for amb. Pt noted to agree, but then remained seated. Pt polite and not refusing, but pt also not completing purposeful movement upon instruction. Pt eventually guided to sit edge of chair in preparation of stand. Pt noted to have significant congestion and need to spit mucus (clear/white). RN stated pt planned for breathing treatment. PT continued VC to stand with pt requiring increased time to attempt stand. Pt then attempted stand and required modA +2 + RW for safe stand. Pt then amb to bed using CGA + RW and transferred sit>supine with modA. Pt then instructed on active cycle breathing. PT demonstrated breathing methods including breath holds and huffing. Pt unable to perform huffing despite visual demonstrations and step break down. At end of session, pt was left semi-reclined in bed with alarm engaged, nurse call bell and all needs in reach.     Therapy Documentation Precautions:  Precautions Precautions: Fall Restrictions Weight Bearing Restrictions: Yes LLE Weight Bearing: Weight bearing as tolerated General:       Therapy/Group: Individual Therapy  Dionne Milo 07/07/2023, 12:04 PM

## 2023-07-07 NOTE — Progress Notes (Signed)
Hypoglycemic Event  CBG: 46  Treatment: 8 oz juice/soda  Symptoms: None  Follow-up CBG: Time:  CBG Result:86  Possible Reasons for Event: Unknown      Dylan Bruce

## 2023-07-07 NOTE — Progress Notes (Signed)
Physical Therapy Session Note  Patient Details  Name: Dylan Bruce MRN: [id number] Date of Birth: 27-May-1946  Today's Date: 07/07/2023 PT Individual Time: 1121-1205 PT Individual Time Calculation (min): 44 min   Short Term Goals: Week 3:  PT Short Term Goal 1 (Week 3): Pt will require CGA with bed mobility PT Short Term Goal 2 (Week 3): Pt will require CGA consistently with sit<>stand's from various surface levels PT Short Term Goal 3 (Week 3): Pt will require CGA  with gait x 200 ft  Skilled Therapeutic Interventions/Progress Updates: Patient supine in bed with IV nurse present (reported to come back after PT). Pt required personal care due to soiled personal boxers. Pt performed slight bridge in bed to doff shorts with modA, and total to thread out of B LE's. Pt then rolled to R with CGA and increased effort for posterior pericare. PTA totalA to thread new boxers and hospital pants through B LE's with cues for pt to perform bridge and donn around waist with maxA. Pt then supine to sit EOB with heavy modA (pt reported increase in pain - unrated - RN notified at end of session to provide medication). Pt then minA to anteriorly scoot to EOB, and minA to stand with bed elevated to RW. Pt needed to have a BM and ambulated in RW to toilet with CGA and increased time for pt to advance B LE's. Pt then with maxA to control descent to toilet with cues to reach back for arm rest. Pt in bathroom with outside supervision and door closed for privacy with PTA changing linen due to soiled sheets. Pt reported not being able to have a BM. Pt then with heavy mod/maxA to stand from toilet to RW due to it being so low, and pt reported increase in R shoulder pain. Pt totalA to donn personal undergarments for time management to get pt back to bed quickly due to pain in shoulder. Pt ambulated back to EOB with CGA/light minA for safety and required VC to reach back to surface to control descent. Pt then maxA to get from sit  to supine due to increase in pain. RN present at that time and alerted to pt R shoulder pain and request for medication. Pt then scooted to [facility] with heavy modA, and VC for pt to use L UE to pull self via HOB rail, and B LE's to push into bed to assist (PTA use of chuck).  Patient supine in bed at end of session with brakes locked, IV nurse, RN and friend present.      Therapy Documentation Precautions:  Precautions Precautions: Fall Restrictions Weight Bearing Restrictions: Yes LLE Weight Bearing: Weight bearing as tolerated   Therapy/Group: Individual Therapy  Martesha Niedermeier PTA 07/07/2023, 12:48 PM

## 2023-07-07 NOTE — Progress Notes (Signed)
Physical Therapy Session Note  Patient Details  Name: Dylan Bruce MRN: [id number] Date of Birth: Nov 23, 1945  Today's Date: 07/07/2023 PT Individual Time: 0800-0900 PT Individual Time Calculation (min): 60 min   Short Term Goals: Week 3:  PT Short Term Goal 1 (Week 3): Pt will require CGA with bed mobility PT Short Term Goal 2 (Week 3): Pt will require CGA consistently with sit<>stand's from various surface levels PT Short Term Goal 3 (Week 3): Pt will require CGA  with gait x 200 ft  Skilled Therapeutic Interventions/Progress Updates:       Pt sitting in TIS w/c to start, finishing up the last few bites of his breakfast. Patient denies any pain, reports having his pain Rx ~1hr prior to therapy session. Pt requesting to wear shorts - cut paper pants to fit and assisted with donning. MaxA overall for donning pants, dependent assist for donning socks/shoes. Patient needing modA to stand from TIS w/c and CGA for standing balance with RW support while pulling pants over hips. Pt ambulates from his room to ortho rehab gym, ~35ft, with CGA and RW - starts with step-to pattern but progress to reciprocal stepping, although gait speed still decreased and slow. From raised mat table, completed standing there-ex and repeated sit<>stands to RW to work on strengthening and form. Standing heel raises and alternating toe taps to 3" block with 4# ankle weights, CGA for balance. Gait training back towards his room with RW and CGA with stop at the bathroom due to urinary urgency - continent of bladder void in standing with RW support. Primary gait deficits continue to be slow speed, forward flexed trunk, limiting weight bearing on LLE, antalgic, and slight veering to the R. Pt made comfortable in recliner with BLE elevated, all needs met at end of session.     Therapy Documentation Precautions:  Precautions Precautions: Fall Restrictions Weight Bearing Restrictions: Yes LLE Weight Bearing: Weight bearing  as tolerated General:      Therapy/Group: Individual Therapy  Jaylei Fuerte P Tien Aispuro PT 07/07/2023, 7:39 AM

## 2023-07-07 NOTE — Progress Notes (Signed)
   07/07/23 2319  BiPAP/CPAP/SIPAP  $ Non-Invasive Home Ventilator  Subsequent  BiPAP/CPAP/SIPAP Pt Type Adult  BiPAP/CPAP/SIPAP Resmed  Reason BIPAP/CPAP not in use  (pt said will put his home cpap on when ready)  Mask Type Nasal mask  Mask Size Medium

## 2023-07-07 NOTE — Progress Notes (Signed)
PROGRESS NOTE   Subjective/Complaints:  No events overnight. Vitals stable Last BM 8/29  BMP this AM showing potassium 6.2, stat EKG showed some mild ST changes in the lateral leads.  Discussed with cardiology, to treat patient acutely for hyperkalemia and feel changes are mild.  Repeat BMP with K5.6.  Patient perseverative on date of discharge, significant cognitive delay with difficulty processing.  Per nursing, this is his baseline, although exams have noted full orientation without significant cognitive deficits.  Otherwise, he has no complaints, no shortness of breath, fevers, chills, nausea, pain.  He feels current level of peripheral edema and is consistent with his normal.  Self-weaning pain meds per notes, still aproximately Q4H.   Later this afternoon, nursing notes patient has a fever 102, gave Tylenol.  ROS:  Per HPI above  Objective:   DG CHEST PORT 1 VIEW  Result Date: 07/06/2023 CLINICAL DATA:  Pulmonary vascular congestion EXAM: PORTABLE CHEST 1 VIEW COMPARISON:  07/03/2023 FINDINGS: Increased interstitial markings, raising the possibility of mild interstitial edema. However, the mildly focal/patchy appearance in the right mid lung raises the possibility of mild infection/pneumonia. No pleural effusion or pneumothorax. Skin fold along the right lung apex and lateral left hemithorax. Cardiomegaly. 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 Median sternotomy. IMPRESSION: Cardiomegaly with possible mild edema. However, pneumonia is also possible, particularly in the right mid lung. No pleural effusions. Electronically Signed   By: Charline Bills M.D.   On: 07/06/2023 08:01   No results for input(s): "WBC", "HGB", "HCT", "PLT" in the last 72 hours.   Recent Labs    07/06/23 0719  NA 137  K 5.6*  CL 104  CO2 23  GLUCOSE 113*  BUN 37*  CREATININE 1.30*  CALCIUM 9.1     Intake/Output  Summary (Last 24 hours) at 07/07/2023 0005 Last data filed at 07/06/2023 2100 Gross per 24 hour  Intake 1192 ml  Output 1300 ml  Net -108 ml     Pressure Injury 06/16/23 Sacrum Medial Stage 2 -  Partial thickness loss of dermis presenting as a shallow open injury with a red, pink wound bed without slough. small open area on coccyx in middle of gluteal cleft 1x1 cm (Active)  06/16/23 1848  Location: Sacrum  Location Orientation: Medial  Staging: Stage 2 -  Partial thickness loss of dermis presenting as a shallow open injury with a red, pink wound bed without slough.  Wound Description (Comments): small open area on coccyx in middle of gluteal cleft 1x1 cm  Present on Admission: Yes    Physical Exam: Vital Signs Blood pressure (!) 137/58, pulse 68, temperature 99 F (37.2 C), temperature source Oral, resp. rate 18, height 6\' 2"  (1.88 m), weight 97.6 kg, SpO2 94%.  General: awake, alert, appropriate, sitting at bedside table HENT: conjugate gaze; oropharynx moist CV: regular rate, atrial flutter; no JVD.  2+ bilateral lower extremity edema. Pulmonary: CTA B/L; no W/R/R- good air movement GI: soft, NT, ND, (+)BS Psychiatric: appropriate mood and affect Neurological: Awake, alert.  Oriented to self, place, and time, but with significant cognitive delays and perseveration on date of discharge.  Unable to concentrate enough to  complete questions during exam + Bilateral upper extremity intention tremors Ext: no clubbing, cyanosis, or edema.  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    Prior exams:    R shoulder has ~ 20-25 degrees AROM LUE shoulder has >90 degrees and 4+/5 in LUE RLE- HF proximally 3-/5 and sitally 4/5 LLE- HF 2-/5; KE 2/5; and DF/PF 4/5-unchanged 8/18    Assessment/Plan: 1. Functional deficits which require 3+ hours per day of interdisciplinary therapy in a comprehensive inpatient rehab setting. Physiatrist is providing close team  supervision and 24 hour management of active medical problems listed below. Physiatrist and rehab team continue to assess barriers to discharge/monitor patient progress toward functional and medical goals  Care Tool:  Bathing    Body parts bathed by patient: Right arm, Left arm, Left upper leg, Chest, Front perineal area, Abdomen, Face, Buttocks, Right upper leg, Right lower leg, Left lower leg   Body parts bathed by helper: Right lower leg, Left lower leg     Bathing assist Assist Level: Supervision/Verbal cueing     Upper Body Dressing/Undressing Upper body dressing   What is the patient wearing?: Pull over shirt    Upper body assist Assist Level: Supervision/Verbal cueing    Lower Body Dressing/Undressing Lower body dressing      What is the patient wearing?: Underwear/pull up, Pants     Lower body assist Assist for lower body dressing: Supervision/Verbal cueing     Toileting Toileting    Toileting assist Assist for toileting: Contact Guard/Touching assist     Transfers Chair/bed transfer  Transfers assist     Chair/bed transfer assist level: Contact Guard/Touching assist     Locomotion Ambulation   Ambulation assist   Ambulation activity did not occur: Safety/medical concerns  Assist level: Contact Guard/Touching assist Assistive device: Walker-rolling Max distance: 65'   Walk 10 feet activity   Assist  Walk 10 feet activity did not occur: Safety/medical concerns  Assist level: Contact Guard/Touching assist Assistive device: Walker-rolling   Walk 50 feet activity   Assist Walk 50 feet with 2 turns activity did not occur: Safety/medical concerns  Assist level: Contact Guard/Touching assist Assistive device: Walker-rolling    Walk 150 feet activity   Assist Walk 150 feet activity did not occur: Safety/medical concerns         Walk 10 feet on uneven surface  activity   Assist Walk 10 feet on uneven surfaces activity did not  occur: Safety/medical concerns         Wheelchair     Assist Is the patient using a wheelchair?: Yes Type of Wheelchair: Manual    Wheelchair assist level: Dependent - Patient 0%      Wheelchair 50 feet with 2 turns activity    Assist        Assist Level: Dependent - Patient 0%   Wheelchair 150 feet activity     Assist      Assist Level: Dependent - Patient 0%   Blood pressure (!) 137/58, pulse 68, temperature 99 F (37.2 C), temperature source Oral, resp. rate 18, height 6\' 2"  (1.88 m), weight 97.6 kg, SpO2 94%.  Medical Problem List and Plan: 1. Functional deficits secondary to  L intertrochanteric hip fx s/p IM nail WBAT             -patient may  shower             -ELOS/Goals: 10-14 days supervision  D/c 9/2  Con't CIR PT and OT  Pt is on 15/7 since 8/20.   2.  Antithrombotics: -DVT/anticoagulation:  Pharmaceutical: Eliquis             -antiplatelet therapy: Plavix , change protonix to pepcid as this will help efficacy    3. 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   No complaints of pain today, continue current regimen  8/13- said pain not controlled from hour 3-4- but declined Oxycontin for pain-stopped last dose of Nabumetone- since Cr up so much  8/14- won't change pain meds since more confused per staff- pneumonia vs chronic? Pt asking again for q3 hours prn meds- explained will not do this.   8/15- pt agreed to keep pain meds aq4 hours- educated has been a little confused- pt  agreed  8/19-8/20-  pt doing better, but wants to add Cosentyx- as below-   8/25- Cosentyx to be brought in from home--still waiting  8/26- pt said got Cosentyx- not clear in chart or on MAR. We discussed decreasing pain meds to 5x/day, not 6x/day, but he needs ot reduce times asking for it.   8/27- discussed again with pt and nursing- he hasn't reduced his amount of pain meds  8/28- explained ot pt going home on q6 hours pain meds- will get 7 days of meds when he leaves- says doesn't have any pain meds- again asked him to reduce taking pain meds to q5 hours- since will be weaned next week. He voiced understanding   8/31: Significant cognitive delay, suspect infectious etiology but certainly high burden of sedating medications could be contributing; will monitor and adjust these if needed  4. Mood/Behavior/Sleep: LCSW to evaluate and provide emotional support             -antipsychotic agents: Vilazodone 40 mg daily             -continue Remeron 30 mg q HS             -continue Provigil 100 mg daily   8/16-   Vistaril to BID prn   5. Neuropsych/cognition: This patient is NOT capable of making decisions on his own behalf.   6. Skin/Wound Care: Routine skin care checks             -sacral pressure injury; continue local care/pressure relief             -monitor surgical incision             L elbow and R lower leg wounds- sutures out, glued 8/21- will get sutures out of L wrist and L hip is glued.   7. Fluids/Electrolytes/Nutrition: Routine Is and Os and follow-up chemistries             -continue Mag-ox 400 mg daily -we will recheck with labs today  8: Hypertension: monitor TID and prn -Continue Imdur 30 mg daily -continue Demadex 20 mg daily   Vitals:   07/06/23 1912 07/06/23 2352  BP: (!) 137/58   Pulse: 70 68  Resp: 18 18  Temp: 99 F (37.2 C)   SpO2: 93% 94%   Fair control 8/18  8/19-8/20 BP controlled- con't regimen  8/21- BP a little elevated this AM 158 systolic- usually  130s-150s- might benefit from hydrochlorothiazide?  8/22- cannot add hydrochlorothiazide since allergic to Probenecid   8/25- BP under fair control--continue regimen  8/26- BP 140s systolci sometimes, but overall controlled  8/28- BP runs 100s to 150s- somewhat labile- don't want to increase BP meds when has low BP sometimes/soft.   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  8/13- CBGs running 199 to 265 in last 24 hours- since Semglee reduced. Don't feel comfortable adding PO meds since Cr up to 1.78 today  CBG (last 3)  Recent Labs    07/06/23 1130 07/06/23 1617 07/06/23 2106  GLUCAP 96 157* 169*  Reduce semglee , cannot use metformin, also allergies preclude glimipride , consider jardiance, took metformin at home may restart if creat 1.2 or better   8/19- CBG's 122 to 249- needs something, but Jardiance cannot use due to hyperkalemia and Metformin not appropriate due to Variable/labile Cr/BUN- Semglee was reduced yesterday- will give 24 hours to follow trend and treat hyperkalemia.   8/20- CBGs 110-233- will try Tradjenta 5 mg daily since not renally dosed  8/24-well controlled. might need ot change Tradjenta due to cost at d/c.   8/25 increased cbg last night likely d/t lower numbers during day and subsequent attempts to raise sugar--continue current regimen  8/26-8/27 BG's well controled in last 24  hours  8/28- CBGs OK- but eating a LOT of snacks per room where I see a lot of snacks.   8/29- decreased Insulin/Semglee to 20 units BID since such good control- don't want to drop too much- Also restarted metformin and Jardiance- per pharmacy recs.   8/30- looking good-no adjustments 8-31  11: Venous stasis: RLE pre-tibial ulcer; continue local wound care    12: S/p left IM nail Dr. [name]             -weight bearing at tolerated   13: Sleep apnea: CPAP at 17 nightly   14: Left rib fractures: pain control and pulmonary toilet   15: Paroxysmal atrial fib/flutter: on Eliquis, Imdur, Demadex (rate controlled no other meds at home)   8/12: EKG with normal ventricular rate, however in atrial flutter, new diagnosis.  Not on rate controlling medications, cardiology consulted for input.. Getting IVF as below. Patient otherwise stable.   8/14- not new per chart- had pAfib- although pt said not aware (  poor STM)- con't since rate appears controlled OFF meds- actually running 50s to 90s  8/15- HR in 50s-60s- so not appropriate for rate meds.   8/19- HR running in the  60s -stable  8-31: EKG with ongoing rate controlled atrial flutter, unchanged.  Mild ST changes likely due to hyperkalemia.  Discussed with cardiology, management as below.    07/06/2023   11:52 PM 07/06/2023    7:12 PM 07/06/2023    1:28 PM  Vitals with BMI  Systolic  137 117  Diastolic  58 60  Pulse 68 70 69    16: BPH: continue Flomax   17:Psoriasis/psoriatic arthritis: his family brought his Desonide 0.05% cream and clobetasol 0.05% cream to apply to hands, elbows, knees daily as needed             -If can get Cosentyx from home, can give to him- 2 weeks late, will make him hurt too much  8/11- educated pt don't have Cosentyx in hospital- will need ot bring form home  8/19- asked pt to have family bring in Cosentyx- think this will help his pain and risks of infection outweighed by benefits of pain reduction  8/20- family to  bring in   8/23-  ? should be here soon  8/26- pt said he received- but I don't see if documented. 8/27- notes they did bring in, but didn't give to nurse- just had pt take dose.    18: Rosacea: his family brought in his Soolantra (ivermectin) 1% cream to apply to face daily as needed   8/11- ordered by pharmacy today  8/15- meds brought in and sent to pharmacy 19: s/p CABG 2023   20: Osteoarthritis: S/p left shoulder arthroplasty; needs the right shoulder done as well- has R frozen shoulder as a result.    21. 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    8/11- BUN up to 60- very dry- and Cr up some- will give IVFs 60cc/hour after therapy til tomorrow and recheck labs in AM  8/12: Creatinine still increasing 1.99, along with increased potassium and downtrending sodium; increase fluid rate today to 1 L IV fluid at 75/h, urine studies pending, repeat BMP at 2 PM to ensure no further worsening -if creatinine increasing or K increasing, will consult nephrology at that time.  May need bicarbonate.  - Urinalysis normal, urine sodium and serum osmolality appear prerenal, awaiting urine osmolality; K responsive to IVF, continue for 1L, Cr/BUN stable will repeat in AM  8/13- Wil give another 12 hours of NS IVFs 100cc/hour- Cr down to 1.78 and BUN still 84-   8/14- Cr 1.45 down from 1.78 and BUN 67 down from 84- will give 1 more 12 hours of IVFs and recheck in AM   8/16- labs came back late- Cr 1.35 and BUN 48- improving- con't to push PO fluids.   8/19- BUN down to 34 and Cr 1.24- much improved- con't to monitor weekly. Will give Lokelma for K+ 5.2- will recheck in AM  8/20- Got Lokelma but K+ up further to 5.3- spoke with pharmacy- will give Lokelma 10 G BID x 2 doses and give IVF NS- 75cc/hour x 16 hours to start  at 3pm- and recheck in AM- if K+ isn't down in AM, will call renal.   8/21- K+  5.3 even after lokelma BID- we discussed that wait and recheck in AM- they aren't worried  until >5.5- which makes sense- will recheck in AM and go from there- might benefit from hydrochlorothiazide? Cr down to 1.18 and BUN still 41- pt drinking 2 cups/day of water- encouraged to drink 6-8 cups/day.   8/22- Cr down to 1.06 but BUN stable at 41- K+ 5.2 again- will not keep intervening since stable off Lokelma-   8/24-25 off IVFs and recheck labs Monday   8/26- K+ 5.4- and Cr back up to 1.35 and BUN 45- not drinking enough- will give Lokelma x2 and recheck in AM  8/27- BMP pending-   8/28- BMP shows Cr 55f 1.48 and BUN 44- BUN stable- Cr up by 0.15 points- K+ 5.0- better after [facility]- will recheck Thursday  8/29- labs pending- ordered for this AM- not showing that have been drawn yet.   8/30- never drawn yesterday- this AM BUN 37 and Cr 1.30- in spite of receiving Demadex yesterday- K+ 5.6- will give Lokelma-   8-31: BUN and creatinine remain elevated 42/1.42, potassium 6.2 with EKG changes as above..  On chart review, does not appear patient got Lokelma over the last 3 days.  Has been getting extra dose of torsemide 20 mg last 2 days.    IV fluid at 75 to 50 cc/h for 24 hours   Extra dose of torsemide 20 mg today; consider increasing to 40 mg daily   Encourage compliance with albuterol, which patient has been refusing   Lokelma 5 mg daily added   Recheck BMP this afternoon -5.6 this a.m.  22. - Walked, but has Spina bifida with frequent falls- will d/w therapy.    23. Leukocytosis - resolved now on last day of abx   8/11- afebrile- feel good- will recheck in AM- if doesn't improve, will check U/A and Cx and CXR- having nurse check wounds as well- just to make sure-   -8-12: WBCs continue uptrending, 17.5 today, along with thrombocytosis and anemia.  Ordered urinalysis with reflex to culture, chest x-ray, initiate IV fluids as above.   Urinalysis within normal limits   Chest x-ray pending - RLL  infiltrate, significant from last CXR, concerning for aspiration pneumonia. Start IV Zosyn 4.75 mg Q8H for 5 days, SLP consult placed for swallow eval. Labs in AM    8/13- WBC down to 10.7 from 17.5- doing much better- con't Zosyn for pneumonia- asked pt to use ICS q2 hours while awake.   Completed Zosyn afebrile  -8-31: Fever as above.  Appears chest x-ray yesterday with questionable pneumonia.  Will recheck labs and urinalysis, then treat for presumptive pneumonia if no other significant findings.  24. Constipation  8/15- Will give Sorbitol 30cc- if no large BM by then  8/16- Large BM yesterday AM before Sorbitol 8/19- LBM 2 days ago- is hard/feels constipated- will change Colace and Senna to Senna-S and increase to 2 tabs BID  8/22- LBM last night- large- greenish brown- not black 8/25 increased miralax to bid 8/24--had large bm yesterday -Continue senna-s 2 tab bid 8/26- LBM Saturday- will intervene again tomorrow if need be 8/27- large BM last night 8/28- since on Senna and miralax, and pt c/o too many meds, will stop Colace for now 8/29- had 2 Bms yesterday- not quite as loose- off Colace 8/30- doing better-still going daily 8-31: Last bowel movement 8-29, low, requires bowel movements for affect-several would give sorbitol tonight if no BM today  25. Anemia  8/15- has dropped, however at the time it was higher, had been hemo-concentrated- and is  now doing better- will monitor closely.   8/16- Hb back up to 8.8- doing better  8/22- Last Hb 8.7- will monitor 26. Anal fissure  8/16- will order/d/w nursing meds from home for anal fissure- since we have in hospital.  27. Osteoporosis-   8/19- pt reports going ot start Fosamax, even before had fx- so needs it! D/w pt today and need for getting it started as soon as leaves. 8/28- pt asking about a bone density test- explained that will need to get PCP to order once got home- it's not done in hospital.  He was upset about this, but  explained this is necessary since just had hip fx.  8/29- asking what meds will take- explained was supposed to start Fosamax prior to admission, so will need to start when leaves.  28. Black stools  8/21- ordered hemoccult- hasn't had another BM yet. Also on Iron TID which could play a part- of note, Hb 8.7- was 8.8-9/1- but also is more hydrated- will recheck in AM-   8/23- no more black stools- will recheck Monday 29. Confusion/overtaking benadryl  8/22- got loopy and confused last night- taking benadryl 50 or more mg at a time including vistaril already ordered due to anxiety- will stop Benadryl- con't vistaril for now- educated pt cannot take home meds  8/27- let pt know no meds from home- taking from home again.  30. Anxiety  8/22- will add Buspar 5 mg TID for anxiety and can increase as required- no more benadryl! 8/26- Anxiety better overall- except used Vistaril last night x1.  8/27- still having SOB intermittently- which is his anxiety. Per nursing, took an entire package of benadryl in last few days- will call son/daughter to discuss.  8/28- had CP/SOB again last evening- sounds like was anxiety- tried to take inhaler 3-4x when given yesterday.  -will increase Buspar to 15 mg TID- will not give pt benzo's due to his pain meds chronically and my concern that keeps wanting to take large amounts of any medicine he treats his anxiety with.   8/29- said anxiety still a major issue- and really limits him- but admits he "doesn't like change". Doesn't feel like Buspar helps/changes a lot.  30- Pulmonary edema vs infiltrate  8/30- will check CBC and BMP in AM to see if has leukocytosis and to f/u after 2nd dose of Demadex  8/31: No CBC this a.m., see workup above    LOS: 21 days A FACE TO FACE EVALUATION WAS PERFORMED  Angelina Sheriff 07/07/2023, 12:05 AM    Addendum:

## 2023-07-08 ENCOUNTER — Inpatient Hospital Stay (HOSPITAL_COMMUNITY): Payer: Commercial Managed Care - PPO

## 2023-07-08 LAB — GLUCOSE, CAPILLARY
Glucose-Capillary: 131 mg/dL — ABNORMAL HIGH (ref 70–99)
Glucose-Capillary: 209 mg/dL — ABNORMAL HIGH (ref 70–99)
Glucose-Capillary: 162 mg/dL — ABNORMAL HIGH (ref 70–99)
Glucose-Capillary: 163 mg/dL — ABNORMAL HIGH (ref 70–99)

## 2023-07-08 LAB — SARS CORONAVIRUS 2 BY RT PCR: SARS Coronavirus 2 by RT PCR: POSITIVE — AB

## 2023-07-08 LAB — BASIC METABOLIC PANEL
Anion gap: 12 (ref 5–15)
BUN: 48 mg/dL — ABNORMAL HIGH (ref 8–23)
CO2: 22 mmol/L (ref 22–32)
Calcium: 8.8 mg/dL — ABNORMAL LOW (ref 8.9–10.3)
Chloride: 103 mmol/L (ref 98–111)
Creatinine, Ser: 1.44 mg/dL — ABNORMAL HIGH (ref 0.61–1.24)
GFR, Estimated: 51 mL/min — ABNORMAL LOW (ref >60)
Glucose, Bld: 148 mg/dL — ABNORMAL HIGH (ref 70–99)
Potassium: 5.2 mmol/L — ABNORMAL HIGH (ref 3.5–5.1)
Sodium: 137 mmol/L (ref 135–145)

## 2023-07-08 MED ORDER — SODIUM CHLORIDE 0.9 % IV SOLN: INTRAVENOUS | Status: AC

## 2023-07-08 MED ORDER — IPRATROPIUM-ALBUTEROL 0.5-2.5 (3) MG/3ML IN SOLN
3.0000 mL | RESPIRATORY_TRACT | Status: DC | PRN
  Administered 2023-07-08 – 2023-07-10 (×5): 3 mL via RESPIRATORY_TRACT
  Filled 2023-07-08 (×4): qty 3

## 2023-07-08 MED ORDER — INSULIN ASPART 100 UNIT/ML IJ SOLN
8.0000 [IU] | Freq: Three times a day (TID) | INTRAMUSCULAR | Status: DC
  Administered 2023-07-08 – 2023-07-10 (×6): 8 [IU] via SUBCUTANEOUS

## 2023-07-08 NOTE — Progress Notes (Signed)
Physical Therapy Session Note  Patient Details  Name: Dylan Bruce MRN: [id number] Date of Birth: 1946/03/11  Today's Date: 07/08/2023 PT Individual Time: 5956-3875 PT Individual Time Calculation (min): 42 min   Short Term Goals: Week 3:  PT Short Term Goal 1 (Week 3): Pt will require CGA with bed mobility PT Short Term Goal 2 (Week 3): Pt will require CGA consistently with sit<>stand's from various surface levels PT Short Term Goal 3 (Week 3): Pt will require CGA  with gait x 200 ft   Skilled Therapeutic Interventions/Progress Updates:      Pt now being tested for COVID due to symptoms - in-room treatment only and treated with proper PPE. Pt unaware of testing, needing explaining to improve understanding. Pt asking about visitor restrictions - suggested erring on the side of caution and will confirm with nursing visitor restrictions.  He still appears to be confused, but not as significant as yesterday. Pt requesting to use the urinal due to urgency - continent of bladder void while sitting in w/c using urinal - charted.   Sit<>stand to RW from TIS w/c with minA, ++ time needed for prep and initiation. CGA for standing balance with UE support in static standing, slight posterior lean present. Standing marching in place with RW and CGA, 1x10 - limited weight acceptance on his weaker L leg due to pain/discomfort. Short distance gait training within his room only with CGA and RW - working on increasing stride length and improving cadence to better efficiency. Pt requesting to brush dentures and mouth at the sink - proceeded from gait training to standing sinkside for ~5 minutes completing these tasks - light minA needed for standing balance due to posterior bias with weight on his heels.  Concluded session with patient sitting in TIS w/c with call bell within reach - all needs met.    Therapy Documentation Precautions:  Precautions Precautions: Fall Restrictions Weight Bearing  Restrictions: Yes LLE Weight Bearing: Weight bearing as tolerated General:     Therapy/Group: Individual Therapy  Teresa Nicodemus P Dylan Bruce PT 07/08/2023, 7:39 AM

## 2023-07-08 NOTE — Progress Notes (Incomplete)
npatient Rehabilitation Admission Medication Review by a Pharmacist   A complete drug regimen review was completed for this patient to identify any potential clinically significant medication issues.   High Risk Drug Classes Is patient taking? 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  Hypoglycemics/insulin Yes insulin glargine, jardiance, metformin - DM Type 2  Vasoactive Medication Yes Isosorbide mononitrate, torsemide - hypertension  Chemotherapy No    Other Yes  Atorvastatin, ezetemibe, fenofibrate - hyperlipidemia Docusate, senna - laxatives Ferrous sulfate, Magnesium oxide, multivitamin - supplements Mirtazapine, vilazodone, buspirone - mood stabilization Modafanil - daytime drowsiness Symbicort, albuterol inhaler - COPD Esomeprazole- GERD Pregabalin - neuropathic pain Tamsulosin - BPH           Type of Medication Issue Identified Description of Issue Recommendation(s)  Drug Interaction(s) (clinically significant)        Duplicate Therapy        Allergy        No Medication Administration End Date        Incorrect Dose        Additional Drug Therapy Needed        Significant med changes from prior encounter (inform family/care partners about these prior to discharge). Modafanil and ferrous sulfate are new     Other            Clinically significant medication issues were identified that warrant physician communication and completion of prescribed/recommended actions by midnight of the next day:  No   Pharmacist comments:    Time spent performing this drug regimen review (minutes):  30     Jani Gravel, PharmD Clinical Pharmacist  07/08/2023 2:33 PM

## 2023-07-08 NOTE — Progress Notes (Signed)
PROGRESS NOTE   Subjective/Complaints:  No events overnight.   Last fever yesterday at approximately 1300, 100.6.  Nursing endorses cough and respiratory symptoms overnight.  Other vital stable.  COVID test positive this a.m.  On a.m. evaluation, patient states he is overall feeling better than he was yesterday, although he did have some subjective fevers and chills overnight.  Currently no shortness of breath or cough, not requiring oxygen.  Cognitively, he does seem better today than he was yesterday.  Per social work note, patient's son came into the hospital last week, may have had a cold.  Patient states that this was a lie, his son was not sick, but had to get some repair work done on their truck.  Unsure how he was exposed.  Potassium improved this a.m., 5.2.  Creatinine down to 1.44 on IV fluid.   ROS:  Per HPI above  Objective:   No results found. Recent Labs    07/07/23 0640 07/07/23 1438  WBC 9.3 9.5  HGB 8.8* 8.6*  HCT 28.9* 28.0*  PLT 328 310     Recent Labs    07/07/23 1438 07/08/23 0648  NA 135 137  K 5.8* 5.2*  CL 100 103  CO2 22 22  GLUCOSE 158* 148*  BUN 49* 48*  CREATININE 1.64* 1.44*  CALCIUM 8.7* 8.8*     Intake/Output Summary (Last 24 hours) at 07/08/2023 0843 Last data filed at 07/08/2023 0456 Gross per 24 hour  Intake 307.5 ml  Output 1700 ml  Net -1392.5 ml     Pressure Injury 06/16/23 Sacrum Medial Stage 2 -  Partial thickness loss of dermis presenting as a shallow open injury with a red, pink wound bed without slough. small open area on coccyx in middle of gluteal cleft 1x1 cm (Active)  06/16/23 1848  Location: Sacrum  Location Orientation: Medial  Staging: Stage 2 -  Partial thickness loss of dermis presenting as a shallow open injury with a red, pink wound bed without slough.  Wound Description (Comments): small open area on coccyx in middle of gluteal cleft 1x1 cm  Present on  Admission: Yes    Physical Exam: Vital Signs Blood pressure 139/68, pulse 64, temperature 98.6 F (37 C), temperature source Oral, resp. rate 18, height 6\' 2"  (1.88 m), weight 97.5 kg, SpO2 97%.  General: awake, alert, appropriate, sitting at bedside table HENT: conjugate gaze; oropharynx moist CV: regular rate, atrial flutter; no JVD.  + bilateral lower extremity edema. Pulmonary: Mild fine right basilar crackles, otherwise clear to auscultation; no W/R/R- good air movement.  On room air. GI: soft, NT, ND, (+)BS Psychiatric: appropriate mood and affect Neurological: Awake, alert.   Oriented to self, place, and time, much improved attention and thought content today. + Bilateral upper extremity intention tremors Moving all 4 limbs antigravity and against resistance.  Ext: no clubbing, cyanosis, or edema.  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 Skin: Right shin wound examined, redressed with Xeroform and gauze.  No apparent drainage or warmth to touch to indicate infection.  2+ edema at the right calf, likely from dressing being too tight.   Prior exams:  R shoulder has ~ 20-25  degrees AROM LUE shoulder has >90 degrees and 4+/5 in LUE RLE- HF proximally 3-/5 and sitally 4/5 LLE- HF 2-/5; KE 2/5; and DF/PF 4/5-unchanged 8/18    Assessment/Plan: 1. Functional deficits which require 3+ hours per day of interdisciplinary therapy in a comprehensive inpatient rehab setting. Physiatrist is providing close team supervision and 24 hour management of active medical problems listed below. Physiatrist and rehab team continue to assess barriers to discharge/monitor patient progress toward functional and medical goals  Care Tool:  Bathing    Body parts bathed by patient: Right arm, Left arm, Left upper leg, Chest, Front perineal area, Abdomen, Face, Buttocks, Right upper leg, Right lower leg, Left lower leg   Body parts bathed by helper: Right lower leg, Left  lower leg     Bathing assist Assist Level: Supervision/Verbal cueing     Upper Body Dressing/Undressing Upper body dressing   What is the patient wearing?: Pull over shirt    Upper body assist Assist Level: Supervision/Verbal cueing    Lower Body Dressing/Undressing Lower body dressing      What is the patient wearing?: Underwear/pull up, Pants     Lower body assist Assist for lower body dressing: Supervision/Verbal cueing     Toileting Toileting    Toileting assist Assist for toileting: Contact Guard/Touching assist     Transfers Chair/bed transfer  Transfers assist     Chair/bed transfer assist level: Contact Guard/Touching assist     Locomotion Ambulation   Ambulation assist   Ambulation activity did not occur: Safety/medical concerns  Assist level: Contact Guard/Touching assist Assistive device: Walker-rolling Max distance: 65'   Walk 10 feet activity   Assist  Walk 10 feet activity did not occur: Safety/medical concerns  Assist level: Contact Guard/Touching assist Assistive device: Walker-rolling   Walk 50 feet activity   Assist Walk 50 feet with 2 turns activity did not occur: Safety/medical concerns  Assist level: Contact Guard/Touching assist Assistive device: Walker-rolling    Walk 150 feet activity   Assist Walk 150 feet activity did not occur: Safety/medical concerns         Walk 10 feet on uneven surface  activity   Assist Walk 10 feet on uneven surfaces activity did not occur: Safety/medical concerns         Wheelchair     Assist Is the patient using a wheelchair?: Yes Type of Wheelchair: Manual    Wheelchair assist level: Dependent - Patient 0%      Wheelchair 50 feet with 2 turns activity    Assist        Assist Level: Dependent - Patient 0%   Wheelchair 150 feet activity     Assist      Assist Level: Dependent - Patient 0%   Blood pressure 139/68, pulse 64, temperature 98.6 F (37  C), temperature source Oral, resp. rate 18, height 6\' 2"  (1.88 m), weight 97.5 kg, SpO2 97%.  Medical Problem List and Plan: 1. Functional deficits secondary to  L intertrochanteric hip fx s/p IM nail WBAT             -patient may  shower             -ELOS/Goals: 10-14 days supervision             - D/c 9/2 -advised team of likely delay in discharge due to COVID-19 infection over this weekend and loss of therapies while recovering.  Primary team to address final discharge date in a.m.  Con't CIR  PT and OT  Pt is on 15/7 since 8/20.   2.  Antithrombotics: -DVT/anticoagulation:  Pharmaceutical: Eliquis             -antiplatelet therapy: Plavix , change protonix to pepcid as this will help efficacy    3. 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   No complaints of pain today, continue current regimen  8/13- said pain not controlled from hour 3-4- but declined Oxycontin for pain-stopped last dose of Nabumetone- since Cr up so much  8/14- won't change pain meds since more confused per staff- pneumonia vs chronic? Pt asking again for q3 hours prn meds- explained will not do this.   8/15- pt agreed to keep pain meds aq4 hours- educated has been a little confused- pt agreed  8/19-8/20-  pt doing better, but wants to add Cosentyx- as below-   8/25- Cosentyx to be brought in from home--still waiting  8/26- pt said got Cosentyx- not clear in chart or on MAR. We discussed decreasing pain meds to 5x/day, not 6x/day, but he needs ot reduce times asking for it.    8/27- discussed again with pt and nursing- he hasn't reduced his amount of pain meds  8/28- explained ot pt going home on q6 hours pain meds- will get 7 days of meds when he leaves- says doesn't have any pain meds- again asked him to reduce taking pain meds to q5 hours- since will be weaned next week. He voiced understanding   8/31: Significant cognitive delay, suspect infectious etiology but certainly high burden of sedating medications could be contributing; will monitor and adjust these if needed  9-1: Cognition much improved with IV fluids, continue current management  4. Mood/Behavior/Sleep: LCSW to evaluate and provide emotional support             -antipsychotic agents: Vilazodone 40 mg daily             -continue Remeron 30 mg q HS             -continue Provigil 100 mg daily   8/16-   Vistaril to BID prn   5. Neuropsych/cognition: This patient is capable of making decisions on his own behalf.   6. Skin/Wound Care: Routine skin care checks             -sacral pressure injury; continue local care/pressure relief             -monitor surgical incision             L elbow and R lower leg wounds- sutures out, glued 8/21- will get sutures out of L wrist and L hip is glued.  9/1 -compared to prior images, right shin wound with more skin loss but appears healthy beefy red tissue underneath; continue management with Xeroform  7. Fluids/Electrolytes/Nutrition: Routine Is and  Os and follow-up chemistries             -continue Mag-ox 400 mg daily -we will recheck with labs today   8: Hypertension: monitor TID and prn -Continue Imdur 30 mg daily -continue Demadex 20 mg daily   Vitals:   07/08/23 1657 07/08/23 1853  BP: 131/62 131/70  Pulse: 65 67  Resp: 19 18  Temp: 99.7 F (37.6 C) (!) 100.8 F (38.2 C)  SpO2: 100% 99%   Fair control 8/18  8/19-8/20 BP controlled- con't regimen  8/21- BP a little elevated this AM 158 systolic- usually 130s-150s- might benefit from  hydrochlorothiazide?  8/22- cannot add hydrochlorothiazide since allergic to Probenecid   8/25- BP under fair control--continue regimen  8/26- BP 140s systolci sometimes, but overall controlled  8/28- BP runs 100s to 150s- somewhat labile- don't want to increase BP meds when has low BP sometimes/soft.   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  8/13- CBGs running 199 to 265 in last 24 hours- since Semglee reduced. Don't feel comfortable adding PO meds since Cr up to 1.78 today  CBG (last 3)  Recent Labs    07/07/23 2102 07/07/23 2137 07/08/23 0600  GLUCAP 46* 86 131*  Reduce semglee , cannot use metformin, also allergies preclude glimipride , consider jardiance, took metformin at home may restart if creat 1.2 or better   8/19- CBG's 122 to 249- needs something, but Jardiance cannot use due to hyperkalemia and Metformin not appropriate due to Variable/labile Cr/BUN- Semglee was reduced yesterday- will give 24 hours to follow trend and treat hyperkalemia.   8/20- CBGs 110-233- will try Tradjenta 5 mg daily since not renally dosed  8/24-well controlled. might need ot change Tradjenta due to cost at d/c.   8/25 increased cbg last night likely d/t lower numbers during day and subsequent attempts to raise sugar--continue current regimen  8/26-8/27 BG's well controled in last 24 hours  8/28- CBGs OK- but  eating a LOT of snacks per room where I see a lot of snacks.   8/29- decreased Insulin/Semglee to 20 units BID since such good control- don't want to drop too much- Also restarted metformin and Jardiance- per pharmacy recs.   8/30- looking good-no adjustments 8-31  9-1: Hypoglycemic overnight 46, came up into the 80s with orange juice, adjust Premeal insulin standing from 12 units to 8 units as it may be over coverage with resistant sliding scale  11: Venous stasis: RLE pre-tibial ulcer; continue local wound care    12: S/p left IM nail Dr. [name]             -weight bearing at tolerated   13: Sleep apnea: CPAP at 17 nightly   14: Left rib  fractures: pain control and pulmonary toilet   15: Paroxysmal atrial fib/flutter: on Eliquis, Imdur, Demadex (rate controlled no other meds at home)   8/12: EKG with normal ventricular rate, however in atrial flutter, new diagnosis.  Not on rate controlling medications, cardiology consulted for input.. Getting IVF as below. Patient otherwise stable.   8/14- not new per chart- had pAfib- although pt said not aware (poor STM)- con't since rate appears controlled OFF meds- actually running 50s to 90s  8/15- HR in 50s-60s- so not appropriate for rate meds.   8/19- HR running in the  60s -stable  8-31: EKG with ongoing rate controlled atrial flutter, unchanged.  Mild ST changes likely due to hyperkalemia.  Discussed with cardiology, management as below.    07/08/2023    4:59 AM 07/08/2023    4:24 AM 07/07/2023    7:20 PM  Vitals with BMI  Weight 214 lbs 15 oz    BMI 27.59    Systolic  139 116  Diastolic  68 62  Pulse  64 67    16: BPH: continue Flomax   17:Psoriasis/psoriatic arthritis: his family brought his Desonide 0.05% cream and clobetasol 0.05% cream to apply to hands, elbows, knees daily as needed             -If can get Cosentyx from home, can give to him- 2 weeks late, will make him hurt too much  8/11- educated pt don't have Cosentyx in  hospital- will need ot bring form home  8/19- asked pt to have family bring in Cosentyx- think this will help his pain and risks of infection outweighed by benefits of pain reduction  8/20- family to bring in   8/23-  ? should be here soon  8/26- pt said he received- but I don't see if documented. 8/27- notes they did bring in, but didn't give to nurse- just had pt take dose.    18: Rosacea: his family brought in his Soolantra (ivermectin) 1% cream to apply to face daily as needed   8/11- ordered by pharmacy today  8/15- meds brought in and sent to pharmacy 19: s/p CABG 2023   20: Osteoarthritis: S/p left shoulder arthroplasty; needs the right shoulder done as well- has R frozen shoulder as a result.    21. 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    8/11- BUN up to 60- very dry- and Cr up some- will give IVFs 60cc/hour after therapy til tomorrow and recheck labs in AM  8/12: Creatinine still increasing 1.99, along with increased potassium and downtrending sodium; increase fluid rate today to 1 L IV fluid at 75/h, urine studies pending, repeat BMP at 2 PM to ensure no further worsening -if creatinine increasing or K increasing, will consult nephrology at that time.  May need bicarbonate.  - Urinalysis normal, urine sodium and serum osmolality appear prerenal, awaiting urine osmolality; K responsive to IVF, continue for 1L, Cr/BUN stable will repeat in AM  8/13- Wil give another 12 hours of NS IVFs 100cc/hour- Cr down to 1.78 and BUN still 84-   8/14- Cr 1.45 down from 1.78 and BUN 67 down from 84- will give 1 more 12 hours of IVFs and recheck in AM   8/16- labs came back late- Cr 1.35 and BUN 48- improving- con't to push PO fluids.   8/19- BUN down to 34 and Cr 1.24- much improved- con't  to monitor weekly. Will give Lokelma for K+ 5.2- will recheck in AM  8/20- Got Lokelma but K+ up  further to 5.3- spoke with pharmacy- will give Lokelma 10 G BID x 2 doses and give IVF NS- 75cc/hour x 16 hours to start  at 3pm- and recheck in AM- if K+ isn't down in AM, will call renal.   8/21- K+ 5.3 even after lokelma BID- we discussed that wait and recheck in AM- they aren't worried until >5.5- which makes sense- will recheck in AM and go from there- might benefit from hydrochlorothiazide? Cr down to 1.18 and BUN still 41- pt drinking 2 cups/day of water- encouraged to drink 6-8 cups/day.   8/22- Cr down to 1.06 but BUN stable at 41- K+ 5.2 again- will not keep intervening since stable off Lokelma-   8/24-25 off IVFs and recheck labs Monday   8/26- K+ 5.4- and Cr back up to 1.35 and BUN 45- not drinking enough- will give Lokelma x2 and recheck in AM  8/27- BMP pending-   8/28- BMP shows Cr 70f 1.48 and BUN 44- BUN stable- Cr up by 0.15 points- K+ 5.0- better after [facility]- will recheck Thursday  8/29- labs pending- ordered for this AM- not showing that have been drawn yet.   8/30- never drawn yesterday- this AM BUN 37 and Cr 1.30- in spite of receiving Demadex yesterday- K+ 5.6- will give Lokelma-   8-31: BUN and creatinine elevated, potassium 6.2 with EKG changes as above..  On chart review, does not appear patient got Lokelma over the last 3 days.  Has been getting extra dose of torsemide 20 mg last 2 days.    IV fluid at 75 to 50 cc/h for 24 hours   Extra dose of torsemide 20 mg today; consider increasing to 40 mg daily   Encourage compliance with albuterol, which patient has been refusing   Lokelma 5 mg daily added   Recheck BMP this afternoon      9-1: Labs improved, creatinine back to 1.4, approximate baseline over last few checks.  Potassium coming down, 5.2 today.  Continue supplemental IV fluids at 50 cc/h, lokelma 1 additional day.  [facility] labs.   22. - Walked, but has Spina bifida with frequent falls- will d/w therapy.    23. Leukocytosis - resolved now on last day of abx    8/11- afebrile- feel good- will recheck in AM- if doesn't improve, will check U/A and Cx and CXR- having nurse check wounds as well- just to make sure-   -8-12: WBCs continue uptrending, 17.5 today, along with thrombocytosis and anemia.  Ordered urinalysis with reflex to culture, chest x-ray, initiate IV fluids as above.   Urinalysis within normal limits   Chest x-ray pending - RLL infiltrate, significant from last CXR, concerning for aspiration pneumonia. Start IV Zosyn 4.75 mg Q8H for 5 days, SLP consult placed for swallow eval. Labs in AM    8/13- WBC down to 10.7 from 17.5- doing much better- con't Zosyn for pneumonia- asked pt to use ICS q2 hours while awake.   - Completed Zosyn afebrile  -8-31: Fever as above.  Appears chest x-ray yesterday with questionable pneumonia.  Will recheck labs and urinalysis, then treat for presumptive pneumonia if no other significant findings.  -9-1: Urinalysis negative.  Patient with respiratory symptoms today, COVID swab + -has as needed Tylenol, adjusted now only for fever treatment to avoid masking.  24. Constipation  8/15- Will give Sorbitol 30cc- if  no large BM by then  8/16- Large BM yesterday AM before Sorbitol 8/19- LBM 2 days ago- is hard/feels constipated- will change Colace and Senna to Senna-S and increase to 2 tabs BID  8/22- LBM last night- large- greenish brown- not black 8/25 increased miralax to bid 8/24--had large bm yesterday -Continue senna-s 2 tab bid 8/26- LBM Saturday- will intervene again tomorrow if need be 8/27- large BM last night 8/28- since on Senna and miralax, and pt c/o too many meds, will stop Colace for now 8/29- had 2 Bms yesterday- not quite as loose- off Colace 8/30- doing better-still going daily 8-31: Last bowel movement 8-29, low, requires bowel movements for affect-several would give sorbitol tonight if no BM today Last bowel movement 9-1, large.  25. Anemia  8/15- has dropped, however at the time it was higher,  had been hemo-concentrated- and is now doing better- will monitor closely.   8/16- Hb back up to 8.8- doing better  8/22- Last Hb 8.7- will monitor 26. Anal fissure  8/16- will order/d/w nursing meds from home for anal fissure- since we have in hospital.  27. Osteoporosis-   8/19- pt reports going ot start Fosamax, even before had fx- so needs it! D/w pt today and need for getting it started as soon as leaves. 8/28- pt asking about a bone density test- explained that will need to get PCP to order once got home- it's not done in hospital.  He was upset about this, but explained this is necessary since just had hip fx.  8/29- asking what meds will take- explained was supposed to start Fosamax prior to admission, so will need to start when leaves.  28. Black stools  8/21- ordered hemoccult- hasn't had another BM yet. Also on Iron TID which could play a part- of note, Hb 8.7- was 8.8-9/1- but also is more hydrated- will recheck in AM-   8/23- no more black stools- will recheck Monday 29. Confusion/overtaking benadryl  8/22- got loopy and confused last night- taking benadryl 50 or more mg at a time including vistaril already ordered due to anxiety- will stop Benadryl- con't vistaril for now- educated pt cannot take home meds  8/27- let pt know no meds from home- taking from home again.  30. Anxiety  8/22- will add Buspar 5 mg TID for anxiety and can increase as required- no more benadryl! 8/26- Anxiety better overall- except used Vistaril last night x1.  8/27- still having SOB intermittently- which is his anxiety. Per nursing, took an entire package of benadryl in last few days- will call son/daughter to discuss.  8/28- had CP/SOB again last evening- sounds like was anxiety- tried to take inhaler 3-4x when given yesterday.  -will increase Buspar to 15 mg TID- will not give pt benzo's due to his pain meds chronically and my concern that keeps wanting to take large amounts of any medicine he treats his  anxiety with.   8/29- said anxiety still a major issue- and really limits him- but admits he "doesn't like change". Doesn't feel like Buspar helps/changes a lot.   30- Pulmonary edema vs infiltrate -COVID-19 + 9/1, symptoms started 8/31   - Contact precautions and droplet precautions   - Change albuterol nebulizers to DuoNebs   - Has as needed guaifenesin for cough, as needed Tylenol for fevers   - Per hospital COVID-19 treatment protocol, Paxlovid contraindicated due to Eliquis, not on oxygen so not candidate for other treatments.  Conservative management for now.  9/1: Doing well, cognition improved, monitor and continue current regimen    LOS: 22 days A FACE TO FACE EVALUATION WAS PERFORMED  Angelina Sheriff 07/08/2023, 8:43 AM    Addendum:

## 2023-07-08 NOTE — Progress Notes (Signed)
Occupational Therapy Session Note  Patient Details  Name: Dylan Bruce MRN: [id number] Date of Birth: 13-May-1946  Today's Date: 07/08/2023 OT Individual Time: 1000-1031 OT Individual Time Calculation (min): 31 min  and Today's Date: 07/08/2023 OT Missed Time: 25 Minutes Missed Time Reason: X-Ray   Short Term Goals: Week 2:  OT Short Term Goal 1 (Week 2): Pt will complete LB dressing with Min A with AE as necessary OT Short Term Goal 1 - Progress (Week 2): Met OT Short Term Goal 2 (Week 2): Pt will complete grooming in standing with Min A OT Short Term Goal 2 - Progress (Week 2): Met OT Short Term Goal 3 (Week 2): Pt will complete footwear Max A OT Short Term Goal 3 - Progress (Week 2): Met  Skilled Therapeutic Interventions/Progress Updates:  Pt received sitting in TIS WC for skilled OT session with focus on discharge planning and functional mobility at room-level. Pt agreeable to interventions, demonstrating overall pleasant mood. Pt with no reports of pain. OT offering intermediate rest breaks and positioning suggestions throughout session to address potential pain/fatigue and maximize participation/safety in session.   Pt presenting this session with decreased/delayed processing in comparison to previous session with this OT.  Pt requires increased time to form thoughts and somewhat tangential in speech.   Time dedicated to updating patient on discharge plans since COVID + diagnosis. Pt disappointed but receptive. Pt shares son did not come to family education due to increase of responsibilities since this patient was admitted. Pt states "we told a lie and said he was sick, but he is perfectly fine."   Pt performs STS with heavy Mod A + RW, ambulating around room for general conditioning with CGA + RW.  Pt misses ~25 mins of skilled OT intervention due to imaging.   Pt remained sitting in [facility] with all immediate needs met at end of session. Pt continues to be appropriate for skilled OT  intervention to promote further functional independence.   Therapy Documentation Precautions:  Precautions Precautions: Fall Restrictions Weight Bearing Restrictions: Yes LLE Weight Bearing: Weight bearing as tolerated   Therapy/Group: Individual Therapy  Lou Cal, OTR/L, MSOT     07/08/2023, 9:39 AM

## 2023-07-08 NOTE — Progress Notes (Signed)
Physical Therapy Session Note  Patient Details  Name: Dylan Bruce MRN: [id number] Date of Birth: 1946-07-29  Today's Date: 07/08/2023 PT Individual Time: 1315-1440 PT Individual Time Calculation (min): 85 min   Short Term Goals: Week 3:  PT Short Term Goal 1 (Week 3): Pt will require CGA with bed mobility PT Short Term Goal 2 (Week 3): Pt will require CGA consistently with sit<>stand's from various surface levels PT Short Term Goal 3 (Week 3): Pt will require CGA  with gait x 200 ft  Skilled Therapeutic Interventions/Progress Updates:      Therapy Documentation Precautions:  Precautions Precautions: Fall Restrictions Weight Bearing Restrictions: Yes LLE Weight Bearing: Weight bearing as tolerated  Pt agreeable to PT session with emphasis on discharge planning, transfer training and gait training. Pt with R shoulder pain in session, provided rest/repositioning for relief and pre-medicated.   Pt requested to exit room and PT reserved ortho gym to adhere to precautions. Pt dependently transported for efficiency to ortho gym.   Initially pt required mod A with sit<>stand transfers x 3. Halfway through session pt able to recall prior sit<>stand technqiue with PT cues and faded to CGA/ supervision for remaining x 10 transfers throughout session.   Pt particpated in gait training and ambulated ~125 ft CGA/close supervision for safety with cues for upright posture and heel to toe gait pattern.   Pt engaged in discussion with PT regarding discharge planning now that pt is COVID positive. Of note pt with delayed processing and when PT inquired pt reports he is having a lot of thoughts in his head. PT provided emotional support and told pt would continue discharge planning discussion at later day.   Pt transported to room and close supervision with sit to stand and able to tolerate standing ~5 minutes with supervision for safety.   Pt left seated in w/c at bedside with all needs in reach  and alarm on.     Therapy/Group: Individual Therapy  Truitt Leep Truitt Leep PT, DPT  07/08/2023, 2:27 PM

## 2023-07-08 NOTE — Plan of Care (Signed)
Pt's plan of care adjusted to 15/7 after speaking with care team and discussed with MD in team conference as pt currently unable to tolerate current therapy schedule with OT, PT, and SLP.   

## 2023-07-09 LAB — CBC WITH DIFFERENTIAL/PLATELET
Abs Immature Granulocytes: 0.05 10*3/uL (ref 0.00–0.07)
Basophils Absolute: 0 10*3/uL (ref 0.0–0.1)
Basophils Relative: 0 %
Eosinophils Absolute: 0.3 10*3/uL (ref 0.0–0.5)
Eosinophils Relative: 5 %
HCT: 27.7 % — ABNORMAL LOW (ref 39.0–52.0)
Hemoglobin: 8.6 g/dL — ABNORMAL LOW (ref 13.0–17.0)
Immature Granulocytes: 1 %
Lymphocytes Relative: 11 %
Lymphs Abs: 0.8 10*3/uL (ref 0.7–4.0)
MCH: 25.9 pg — ABNORMAL LOW (ref 26.0–34.0)
MCHC: 31 g/dL (ref 30.0–36.0)
MCV: 83.4 fL (ref 80.0–100.0)
Monocytes Absolute: 1.2 10*3/uL — ABNORMAL HIGH (ref 0.1–1.0)
Monocytes Relative: 17 %
Neutro Abs: 5 10*3/uL (ref 1.7–7.7)
Neutrophils Relative %: 66 %
Platelets: 338 10*3/uL (ref 150–400)
RBC: 3.32 MIL/uL — ABNORMAL LOW (ref 4.22–5.81)
RDW: 19.7 % — ABNORMAL HIGH (ref 11.5–15.5)
WBC: 7.4 10*3/uL (ref 4.0–10.5)
nRBC: 0 % (ref 0.0–0.2)

## 2023-07-09 LAB — GLUCOSE, CAPILLARY
Glucose-Capillary: 119 mg/dL — ABNORMAL HIGH (ref 70–99)
Glucose-Capillary: 176 mg/dL — ABNORMAL HIGH (ref 70–99)
Glucose-Capillary: 130 mg/dL — ABNORMAL HIGH (ref 70–99)
Glucose-Capillary: 145 mg/dL — ABNORMAL HIGH (ref 70–99)

## 2023-07-09 LAB — BASIC METABOLIC PANEL
Anion gap: 13 (ref 5–15)
BUN: 51 mg/dL — ABNORMAL HIGH (ref 8–23)
CO2: 18 mmol/L — ABNORMAL LOW (ref 22–32)
Calcium: 8.9 mg/dL (ref 8.9–10.3)
Chloride: 104 mmol/L (ref 98–111)
Creatinine, Ser: 1.44 mg/dL — ABNORMAL HIGH (ref 0.61–1.24)
GFR, Estimated: 51 mL/min — ABNORMAL LOW (ref >60)
Glucose, Bld: 106 mg/dL — ABNORMAL HIGH (ref 70–99)
Potassium: 5.5 mmol/L — ABNORMAL HIGH (ref 3.5–5.1)
Sodium: 135 mmol/L (ref 135–145)

## 2023-07-09 MED ORDER — OXYCODONE HCL 5 MG PO TABS
15.0000 mg | ORAL_TABLET | Freq: Four times a day (QID) | ORAL | Status: DC | PRN
  Administered 2023-07-09 – 2023-07-10 (×4): 15 mg via ORAL
  Filled 2023-07-09 (×4): qty 3

## 2023-07-09 NOTE — Progress Notes (Signed)
Occupational Therapy Session Note  Patient Details  Name: Dylan Bruce MRN: [id number] Date of Birth: 12-Jun-1946  Today's Date: 07/09/2023 OT Individual Time: 6063-0160 OT Individual Time Calculation (min): 70 min    Short Term Goals: Week 3:  OT Short Term Goal 1 (Week 3): STG=LTG d/t ELOS  Skilled Therapeutic Interventions/Progress Updates:  Pt received sitting in TIS WC for skilled OT session with focus on discharge planning and caregiver education. Pt agreeable to interventions, demonstrating overall pleasant mood. Pt with no reports of pain. OT offering intermediate rest breaks and positioning suggestions throughout session to address potential pain/fatigue and maximize participation/safety in session.   Extended time in session dedicated to coordinating upcoming discharge with treatment team, as patient was attempting to leave AMA, stating "the doctor and I talked and I am going home today." Spouse called during session as form of unofficial caregiver education with focus placed on DME/AE recommendations. Informational handouts to be provided.   Pt with urgency, ambulating into/out bathroom with CGA-SUP + RW, doffing boxers and completing peri-care with supervision/setup. Dependently donning clean pair due to time constraints.   Pt remained sitting EOB (NT present) with all immediate needs met at end of session. Pt continues to be appropriate for skilled OT intervention to promote further functional independence.   Therapy Documentation Precautions:  Precautions Precautions: Fall Restrictions Weight Bearing Restrictions: Yes LLE Weight Bearing: Weight bearing as tolerated   Therapy/Group: Individual Therapy  Lou Cal, OTR/L, MSOT  07/09/2023, 3:51 PM

## 2023-07-09 NOTE — Progress Notes (Addendum)
Patient ID: Dylan Bruce, male   DOB: [birth year], 77 y.o.   MRN: [id number]  MD has decided to keep pt here until tomorrow due to COVID. Wife and pt in agreement.  11:48 AM Gave pt information on getting a bariatric bedside commode and he will follow up with. Aware is cheaper for him to get on-line then to order from an equipment company. His walker was delivered to his room today for home.

## 2023-07-09 NOTE — Progress Notes (Signed)
Physical Therapy Session Note  Patient Details  Name: Dylan Bruce MRN: [id number] Date of Birth: November 17, 1945  Today's Date: 07/09/2023 PT Individual Time: 1501-1529 PT Individual Time Calculation (min): 28 min   Short Term Goals: Week 3:  PT Short Term Goal 1 (Week 3): Pt will require CGA with bed mobility PT Short Term Goal 2 (Week 3): Pt will require CGA consistently with sit<>stand's from various surface levels PT Short Term Goal 3 (Week 3): Pt will require CGA  with gait x 200 ft  Skilled Therapeutic Interventions/Progress Updates:      Pt seated in WC upon arrival. Pt agreeable to therapy. Pt reports 7/10 L hip pain. PT donned all PPE for contact and airborne precautions.   Newly dispensed RW in room. PT adjusted RW to pt appropriate height. Pt performed sit<>stand x 10 with RW and CGA progressing to supervision with recall for technique.   Pt able to recall methods to reduce fall risk including use of RW and having someone with him at all times prior to perform any standing or transfers or ambulation Education provided to call 911 for assistance getting up in event of fall. Pt verbalized understanding and agreeable.   Pt seated in WC at end of session with all needs within reach and seatbelt alarm on.    Therapy Documentation Precautions:  Precautions Precautions: Fall Restrictions Weight Bearing Restrictions: Yes LLE Weight Bearing: Weight bearing as tolerated  Therapy/Group: Individual Therapy  [facility] Ambrose Finland, [place], DPT  07/09/2023, 7:53 AM

## 2023-07-09 NOTE — Progress Notes (Signed)
   07/09/23 2110  BiPAP/CPAP/SIPAP  $ Non-Invasive Home Ventilator  Subsequent  BiPAP/CPAP/SIPAP Pt Type Adult  BiPAP/CPAP/SIPAP Resmed  Respiratory Rate 18 breaths/min  Patient Home Equipment Yes  CPAP/SIPAP surface wiped down Yes  Safety Check Completed by RT for Home Unit Yes, no issues noted  BiPAP/CPAP /SiPAP Vitals  Pulse Rate 70  Resp 18  SpO2 98 %  Bilateral Breath Sounds Clear;Diminished  MEWS Score/Color  MEWS Score 0  MEWS Score Color [place] Si

## 2023-07-09 NOTE — Progress Notes (Signed)
Inpatient Rehabilitation Care Coordinator Discharge Note   Patient Details  Name: Dylan Bruce MRN: [id number] Date of Birth: 1946/08/24   Discharge location: HOME WITH WIFE WHO CAN PROVIDE SUPERVISION CHILDREN TO CHECK ON  Length of Stay: 24 days  Discharge activity level: SUPERVISION WITH CUES  Home/community participation: ACTIVE  Patient response GU:[id number] Literacy - How often do you need to have someone help you when you read instructions, pamphlets, or other written material from your doctor or pharmacy?: Never  Patient response QQ:VZDGLO Isolation - How often do you feel lonely or isolated from those around you?: Never  Services provided included: MD, RD, PT, OT, RN, CM, Pharmacy, SW  Financial Services:  Field seismologist Utilized: Production designer, theatre/television/film  Choices offered to/list presented to: PT AND WIFE  Follow-up services arranged:  Home Health, DME, Patient/Family has no preference for HH/DME agencies Home Health Agency: [facility]  PT  & OT    DME : [facility]    Patient response to transportation need: Is the patient able to respond to transportation needs?: Yes In the past 12 months, has lack of transportation kept you from medical appointments or from getting medications?: No In the past 12 months, has lack of transportation kept you from meetings, work, or from getting things needed for daily living?: No   Patient/Family verbalized understanding of follow-up arrangements:  Yes  Individual responsible for coordination of the follow-up plan: SELF AND JANE-WIFE [phone]  Confirmed correct DME delivered: Lucy Chris 07/09/2023    Comments (or additional information): FAMILY WAS IN FOR EDUCATION AND ALL FEEL READY FOR DISCHARGE HOME TODAY.  Summary of Stay    Date/Time Discharge Planning CSW  07/03/23 0827 Family coming in this week to do hands on education in prepartion for discharge next week. Aware pt wil need  supervision at discharge. Have arranged home health and will order DME once have recommendations RGD  06/26/23 0822 Pt making progress in therapies, has been here to observe. Pt's goals are supervision wife aware of need for this. Work on discharge needs. RGD  06/19/23 0830 HOme with wife who is herself recovering from an wrist fracture, she does work but is currently out of work. Made aware husband will require 24/7 supervision at DC will need to arrange RGD       Winefred [place], Lemar Livings

## 2023-07-09 NOTE — Progress Notes (Signed)
PROGRESS NOTE   Subjective/Complaints:  No events overnight.   Last fever yesterday at approximately 1300, 100.6.  Nursing endorses cough and respiratory symptoms overnight.  Other vital stable.  COVID test positive this a.m.  On a.m. evaluation, patient states he is overall feeling better than he was yesterday, although he did have some subjective fevers and chills overnight.  Currently no shortness of breath or cough, not requiring oxygen.  Cognitively, he does seem better today than he was yesterday.  Per social work note, patient's son came into the hospital last week, may have had a cold.  Patient states that this was a lie, his son was not sick, but had to get some repair work done on their truck.  Unsure how he was exposed.  Potassium improved this a.m., 5.2.  Creatinine down to 1.44 on IV fluid.   ROS:  Per HPI above  Objective:   DG Chest Port 1 View  Result Date: 07/08/2023 CLINICAL DATA:  COVID positive, cough. EXAM: PORTABLE CHEST 1 VIEW COMPARISON:  Chest radiograph dated 07/06/2023. FINDINGS: The heart is enlarged. Vascular calcifications are seen in the aortic arch. Mild diffuse bilateral interstitial opacities are noted. No pleural effusion or pneumothorax. Degenerative changes are seen in the spine. IMPRESSION: Mild diffuse bilateral interstitial opacities may reflect pulmonary edema or atypical pneumonia. Electronically Signed   By: Romona Curls M.D.   On: 07/08/2023 14:35   Recent Labs    07/07/23 0640 07/07/23 1438  WBC 9.3 9.5  HGB 8.8* 8.6*  HCT 28.9* 28.0*  PLT 328 310     Recent Labs    07/07/23 1438 07/08/23 0648  NA 135 137  K 5.8* 5.2*  CL 100 103  CO2 22 22  GLUCOSE 158* 148*  BUN 49* 48*  CREATININE 1.64* 1.44*  CALCIUM 8.7* 8.8*     Intake/Output Summary (Last 24 hours) at 07/09/2023 0848 Last data filed at 07/09/2023 0443 Gross per 24 hour  Intake 975.83 ml  Output 925 ml  Net 50.83  ml     Pressure Injury 06/16/23 Sacrum Medial Stage 2 -  Partial thickness loss of dermis presenting as a shallow open injury with a red, pink wound bed without slough. small open area on coccyx in middle of gluteal cleft 1x1 cm (Active)  06/16/23 1848  Location: Sacrum  Location Orientation: Medial  Staging: Stage 2 -  Partial thickness loss of dermis presenting as a shallow open injury with a red, pink wound bed without slough.  Wound Description (Comments): small open area on coccyx in middle of gluteal cleft 1x1 cm  Present on Admission: Yes    Physical Exam: Vital Signs Blood pressure (!) 151/83, pulse 72, temperature 99.1 F (37.3 C), resp. rate 18, height 6\' 2"  (1.88 m), weight 97.5 kg, SpO2 96%.  General: awake, alert, appropriate, sitting at bedside table HENT: conjugate gaze; oropharynx moist CV: regular rate, atrial flutter; no JVD.  + bilateral lower extremity edema. Pulmonary: Mild fine right basilar crackles, otherwise clear to auscultation; no W/R/R- good air movement.  On room air. GI: soft, NT, ND, (+)BS Psychiatric: appropriate mood and affect Neurological: Awake, alert.   Oriented to self, place,  and time, much improved attention and thought content today. + Bilateral upper extremity intention tremors Moving all 4 limbs antigravity and against resistance.  Ext: no clubbing, cyanosis, or edema.  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 Skin: Right shin wound examined, redressed with Xeroform and gauze.  No apparent drainage or warmth to touch to indicate infection.  2+ edema at the right calf, likely from dressing being too tight.   Prior exams:  R shoulder has ~ 20-25 degrees AROM LUE shoulder has >90 degrees and 4+/5 in LUE RLE- HF proximally 3-/5 and sitally 4/5 LLE- HF 2-/5; KE 2/5; and DF/PF 4/5-unchanged 8/18    Assessment/Plan: 1. Functional deficits which require 3+ hours per day of interdisciplinary therapy in a  comprehensive inpatient rehab setting. Physiatrist is providing close team supervision and 24 hour management of active medical problems listed below. Physiatrist and rehab team continue to assess barriers to discharge/monitor patient progress toward functional and medical goals  Care Tool:  Bathing    Body parts bathed by patient: Right arm, Left arm, Left upper leg, Chest, Front perineal area, Abdomen, Face, Buttocks, Right upper leg, Right lower leg, Left lower leg   Body parts bathed by helper: Right lower leg, Left lower leg     Bathing assist Assist Level: Supervision/Verbal cueing     Upper Body Dressing/Undressing Upper body dressing   What is the patient wearing?: Pull over shirt    Upper body assist Assist Level: Supervision/Verbal cueing    Lower Body Dressing/Undressing Lower body dressing      What is the patient wearing?: Underwear/pull up, Pants     Lower body assist Assist for lower body dressing: Supervision/Verbal cueing     Toileting Toileting    Toileting assist Assist for toileting: Contact Guard/Touching assist     Transfers Chair/bed transfer  Transfers assist     Chair/bed transfer assist level: Contact Guard/Touching assist     Locomotion Ambulation   Ambulation assist   Ambulation activity did not occur: Safety/medical concerns  Assist level: Contact Guard/Touching assist Assistive device: Walker-rolling Max distance: 65'   Walk 10 feet activity   Assist  Walk 10 feet activity did not occur: Safety/medical concerns  Assist level: Contact Guard/Touching assist Assistive device: Walker-rolling   Walk 50 feet activity   Assist Walk 50 feet with 2 turns activity did not occur: Safety/medical concerns  Assist level: Contact Guard/Touching assist Assistive device: Walker-rolling    Walk 150 feet activity   Assist Walk 150 feet activity did not occur: Safety/medical concerns         Walk 10 feet on uneven surface   activity   Assist Walk 10 feet on uneven surfaces activity did not occur: Safety/medical concerns         Wheelchair     Assist Is the patient using a wheelchair?: Yes Type of Wheelchair: Manual    Wheelchair assist level: Dependent - Patient 0%      Wheelchair 50 feet with 2 turns activity    Assist        Assist Level: Dependent - Patient 0%   Wheelchair 150 feet activity     Assist      Assist Level: Dependent - Patient 0%   Blood pressure (!) 151/83, pulse 72, temperature 99.1 F (37.3 C), resp. rate 18, height 6\' 2"  (1.88 m), weight 97.5 kg, SpO2 96%.  Medical Problem List and Plan: 1. Functional deficits secondary to  L intertrochanteric hip fx s/p IM nail WBAT             -  patient may  shower             -ELOS/Goals: 10-14 days supervision             - D/c 9/2 -advised team of likely delay in discharge due to COVID-19 infection over this weekend and loss of therapies while recovering.  Primary team to address final discharge date in a.m.  9/2- Con't CIR PT and OT  Pt is on 15/7 since 8/20.   Con't CIR PT and OT 15/7  Will re-eval Tuesday and if doing worse from COVID, then see if can be transferred to acute- if stable, can stay until afebrile- if better, can go home. Wife reports will NOT take him home today.  2.  Antithrombotics: -DVT/anticoagulation:  Pharmaceutical: Eliquis             -antiplatelet therapy: Plavix , change protonix to pepcid as this will help efficacy    3. 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   No complaints of pain today, continue current regimen  8/13- said pain not controlled from hour 3-4- but declined Oxycontin for pain-stopped last dose of Nabumetone- since Cr up so much  8/14- won't change pain meds since more confused per staff- pneumonia vs chronic? Pt asking again for q3 hours prn meds- explained will not do this.   8/15- pt agreed to keep pain meds aq4 hours- educated has been a little confused- pt agreed  8/19-8/20-  pt doing better, but wants to add Cosentyx- as below-   8/25- Cosentyx to be brought in from home--still waiting  8/26- pt said got Cosentyx- not clear in chart or on MAR. We discussed decreasing pain meds to 5x/day, not 6x/day, but he needs ot reduce times asking for it.   8/27- discussed again with pt and nursing- he hasn't reduced his amount of pain meds  8/28- explained ot pt going home on q6 hours pain meds- will get 7 days of meds when he leaves- says doesn't have any pain meds- again asked him to reduce taking pain meds to q5 hours- since will be weaned next week. He voiced understanding   8/31: Significant cognitive delay, suspect infectious etiology but certainly high burden of sedating medications could be contributing; will monitor and adjust these if needed  9-1: Cognition much improved with IV fluids, continue current management  9/2- decrease Oxy to 15 mg q6 hours prn per wife was taking 10 mg Percocet QID not the 20 mg he insists he was taking- at home-  4. Mood/Behavior/Sleep: LCSW to evaluate and provide emotional support             -antipsychotic agents: Vilazodone 40 mg daily             -continue Remeron 30 mg q HS             -  continue Provigil 100 mg daily   8/16-   Vistaril to BID prn   5. Neuropsych/cognition: This patient is? (Intermittently confused)  capable of making decisions on his own behalf.   6. Skin/Wound Care: Routine skin care checks             -sacral pressure injury; continue local care/pressure  relief             -monitor surgical incision             L elbow and R lower leg wounds- sutures out, glued 8/21- will get sutures out of L wrist and L hip is glued.  9/1 -compared to prior images, right shin wound with more skin loss but appears healthy beefy red tissue underneath; continue management with Xeroform  7. Fluids/Electrolytes/Nutrition: Routine Is and Os and follow-up chemistries             -continue Mag-ox 400 mg daily -we will recheck with labs today   8: Hypertension: monitor TID and prn -Continue Imdur 30 mg daily -continue Demadex 20 mg daily   Vitals:   07/08/23 2302 07/09/23 0325  BP: 132/62 (!) 151/83  Pulse: 65 72  Resp: 18 18  Temp: 98.4 F (36.9 C) 99.1 F (37.3 C)  SpO2: 97% 96%   Fair control 8/18  8/19-8/20 BP controlled- con't regimen  8/21- BP a little elevated this AM 158 systolic- usually 130s-150s- might benefit from hydrochlorothiazide?  8/22- cannot add hydrochlorothiazide since allergic to Probenecid   8/25- BP under fair control--continue regimen  8/26- BP 140s systolci sometimes, but overall controlled  8/28- BP runs 100s to 150s- somewhat labile- don't want to increase BP meds when has low BP sometimes/soft.   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  8/13- CBGs running 199 to 265 in last 24 hours- since Semglee reduced. Don't feel comfortable adding  PO meds since Cr up to 1.78 today  CBG (last 3)  Recent Labs    07/08/23 1641 07/08/23 2016 07/09/23 0554  GLUCAP 162* 163* 119*  Reduce semglee , cannot use metformin, also allergies preclude glimipride , consider jardiance, took metformin at home may restart if creat 1.2 or better   8/19- CBG's 122 to 249- needs something, but Jardiance cannot use due to hyperkalemia and Metformin not appropriate due to Variable/labile Cr/BUN- Semglee was reduced yesterday- will give 24 hours to follow trend and treat hyperkalemia.   8/20- CBGs 110-233- will try Tradjenta 5 mg daily since not renally dosed  8/24-well controlled. might need ot change Tradjenta due to cost at d/c.   8/25 increased cbg last night likely d/t lower numbers during day and subsequent attempts to raise sugar--continue current regimen  8/26-8/27 BG's well controled in  last 24 hours  8/28- CBGs OK- but eating a LOT of snacks per room where I see a lot of snacks.   8/29- decreased Insulin/Semglee to 20 units BID since such good control- don't want to drop too much- Also restarted metformin and Jardiance- per pharmacy recs.   8/30- looking good-no adjustments 8-31  9-1: Hypoglycemic overnight 46, came up into the 80s with orange juice, adjust Premeal insulin standing from 12 units to 8 units as it may be over coverage with resistant sliding scale  9/2- CBGS look better 11: Venous stasis: RLE pre-tibial ulcer; continue local wound care    12: S/p left IM nail Dr. [name]             -weight bearing at tolerated   13: Sleep apnea: CPAP at 17 nightly   14: Left rib fractures: pain control and pulmonary toilet   15: Paroxysmal atrial fib/flutter: on Eliquis, Imdur, Demadex (rate controlled no other meds at home)   8/12: EKG with normal ventricular rate, however in atrial flutter, new diagnosis.  Not on rate controlling medications, cardiology consulted for input.. Getting IVF as below. Patient otherwise stable.   8/14- not new per  chart- had pAfib- although pt said not aware (poor STM)- con't since rate appears controlled OFF meds- actually running 50s to 90s  8/15- HR in 50s-60s- so not appropriate for rate meds.   8/19- HR running in the  60s -stable  8-31: EKG with ongoing rate controlled atrial flutter, unchanged.  Mild ST changes likely due to hyperkalemia.  Discussed with cardiology, management as below.  9/2- BP running 140s-150s- but had been controlled monitor trend before changes    07/09/2023    3:25 AM 07/08/2023   11:02 PM 07/08/2023    6:53 PM  Vitals with BMI  Systolic 151 132 161  Diastolic 83 62 70  Pulse 72 65 67    16: BPH: continue Flomax   17:Psoriasis/psoriatic arthritis: his family brought his Desonide 0.05% cream and clobetasol 0.05% cream to apply to hands, elbows, knees daily as needed             -If can get Cosentyx from home, can give to him- 2 weeks late, will make him hurt too much  8/11- educated pt don't have Cosentyx in hospital- will need ot bring form home  8/19- asked pt to have family bring in Cosentyx- think this will help his pain and risks of infection outweighed by benefits of pain reduction  8/20- family to bring in   8/23-  ? should be here soon  8/26- pt said he received- but I don't see if documented. 8/27- notes they did bring in, but didn't give to nurse- just had pt take dose.    18: Rosacea: his family brought in his Soolantra (ivermectin) 1% cream to apply to face daily as needed   8/11- ordered by pharmacy today  8/15- meds brought in and sent to pharmacy 19: s/p CABG 2023   20: Osteoarthritis: S/p left shoulder arthroplasty; needs the right shoulder done as well- has R frozen shoulder as a result.    21. 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    8/11- BUN up to 60- very dry- and Cr up some- will give IVFs 60cc/hour after therapy til tomorrow and  recheck labs in AM  8/12: Creatinine still increasing 1.99, along with increased potassium and downtrending  sodium; increase fluid rate today to 1 L IV fluid at 75/h, urine studies pending, repeat BMP at 2 PM to ensure no further worsening -if creatinine increasing or K increasing, will consult nephrology at that time.  May need bicarbonate.  - Urinalysis normal, urine sodium and serum osmolality appear prerenal, awaiting urine osmolality; K responsive to IVF, continue for 1L, Cr/BUN stable will repeat in AM  8/13- Wil give another 12 hours of NS IVFs 100cc/hour- Cr down to 1.78 and BUN still 84-   8/14- Cr 1.45 down from 1.78 and BUN 67 down from 84- will give 1 more 12 hours of IVFs and recheck in AM   8/16- labs came back late- Cr 1.35 and BUN 48- improving- con't to push PO fluids.   8/19- BUN down to 34 and Cr 1.24- much improved- con't to monitor weekly. Will give Lokelma for K+ 5.2- will recheck in AM  8/20- Got Lokelma but K+ up further to 5.3- spoke with pharmacy- will give Lokelma 10 G BID x 2 doses and give IVF NS- 75cc/hour x 16 hours to start  at 3pm- and recheck in AM- if K+ isn't down in AM, will call renal.   8/21- K+ 5.3 even after lokelma BID- we discussed that wait and recheck in AM- they aren't worried until >5.5- which makes sense- will recheck in AM and go from there- might benefit from hydrochlorothiazide? Cr down to 1.18 and BUN still 41- pt drinking 2 cups/day of water- encouraged to drink 6-8 cups/day.   8/22- Cr down to 1.06 but BUN stable at 41- K+ 5.2 again- will not keep intervening since stable off Lokelma-   8/24-25 off IVFs and recheck labs Monday   8/26- K+ 5.4- and Cr back up to 1.35 and BUN 45- not drinking enough- will give Lokelma x2 and recheck in AM  8/27- BMP pending-   8/28- BMP shows Cr 49f 1.48 and BUN 44- BUN stable- Cr up by 0.15 points- K+ 5.0- better after [facility]- will recheck Thursday  8/29- labs pending- ordered for this AM- not showing that have been  drawn yet.   8/30- never drawn yesterday- this AM BUN 37 and Cr 1.30- in spite of receiving Demadex yesterday- K+ 5.6- will give Lokelma-   8-31: BUN and creatinine elevated, potassium 6.2 with EKG changes as above..  On chart review, does not appear patient got Lokelma over the last 3 days.  Has been getting extra dose of torsemide 20 mg last 2 days.    IV fluid at 75 to 50 cc/h for 24 hours   Extra dose of torsemide 20 mg today; consider increasing to 40 mg daily   Encourage compliance with albuterol, which patient has been refusing   Lokelma 5 mg daily added   Recheck BMP this afternoon      9-1: Labs improved, creatinine back to 1.4, approximate baseline over last few checks.  Potassium coming down, 5.2 today.  Continue supplemental IV fluids at 50 cc/h, lokelma 1 additional day.  [facility] labs.  9/2- Labs still pending- at 9am  22. - Walked, but has Spina bifida with frequent falls- will d/w therapy.    23. Leukocytosis - resolved now on last day of abx   8/11- afebrile- feel good- will recheck in AM- if doesn't improve, will check U/A and Cx and CXR- having nurse check wounds as well- just to make sure-   -8-12: WBCs continue uptrending, 17.5 today, along with thrombocytosis and anemia.  Ordered  urinalysis with reflex to culture, chest x-ray, initiate IV fluids as above.   Urinalysis within normal limits   Chest x-ray pending - RLL infiltrate, significant from last CXR, concerning for aspiration pneumonia. Start IV Zosyn 4.75 mg Q8H for 5 days, SLP consult placed for swallow eval. Labs in AM    8/13- WBC down to 10.7 from 17.5- doing much better- con't Zosyn for pneumonia- asked pt to use ICS q2 hours while awake.   - Completed Zosyn afebrile  -8-31: Fever as above.  Appears chest x-ray yesterday with questionable pneumonia.  Will recheck labs and urinalysis, then treat for presumptive pneumonia if no other significant findings.  -9-1: Urinalysis negative.  Patient with respiratory  symptoms today, COVID swab + -has as needed Tylenol, adjusted now only for fever treatment to avoid masking.  24. Constipation  8/15- Will give Sorbitol 30cc- if no large BM by then  8/16- Large BM yesterday AM before Sorbitol 8/19- LBM 2 days ago- is hard/feels constipated- will change Colace and Senna to Senna-S and increase to 2 tabs BID  8/22- LBM last night- large- greenish brown- not black 8/25 increased miralax to bid 8/24--had large bm yesterday -Continue senna-s 2 tab bid 8/26- LBM Saturday- will intervene again tomorrow if need be 8/27- large BM last night 8/28- since on Senna and miralax, and pt c/o too many meds, will stop Colace for now 8/29- had 2 Bms yesterday- not quite as loose- off Colace 8/30- doing better-still going daily 8-31: Last bowel movement 8-29, low, requires bowel movements for affect-several would give sorbitol tonight if no BM today Last bowel movement 9-1, large.  25. Anemia  8/15- has dropped, however at the time it was higher, had been hemo-concentrated- and is now doing better- will monitor closely.   8/16- Hb back up to 8.8- doing better  8/22- Last Hb 8.7- will monitor 26. Anal fissure  8/16- will order/d/w nursing meds from home for anal fissure- since we have in hospital.  27. Osteoporosis-   8/19- pt reports going ot start Fosamax, even before had fx- so needs it! D/w pt today and need for getting it started as soon as leaves. 8/28- pt asking about a bone density test- explained that will need to get PCP to order once got home- it's not done in hospital.  He was upset about this, but explained this is necessary since just had hip fx.  8/29- asking what meds will take- explained was supposed to start Fosamax prior to admission, so will need to start when leaves.  28. Black stools  8/21- ordered hemoccult- hasn't had another BM yet. Also on Iron TID which could play a part- of note, Hb 8.7- was 8.8-9/1- but also is more hydrated- will recheck in AM-    8/23- no more black stools- will recheck Monday 29. Confusion/overtaking benadryl  8/22- got loopy and confused last night- taking benadryl 50 or more mg at a time including vistaril already ordered due to anxiety- will stop Benadryl- con't vistaril for now- educated pt cannot take home meds  8/27- let pt know no meds from home- taking from home again.  30. Anxiety  8/22- will add Buspar 5 mg TID for anxiety and can increase as required- no more benadryl! 8/26- Anxiety better overall- except used Vistaril last night x1.  8/27- still having SOB intermittently- which is his anxiety. Per nursing, took an entire package of benadryl in last few days- will call son/daughter to discuss.  8/28- had CP/SOB  again last evening- sounds like was anxiety- tried to take inhaler 3-4x when given yesterday.  -will increase Buspar to 15 mg TID- will not give pt benzo's due to his pain meds chronically and my concern that keeps wanting to take large amounts of any medicine he treats his anxiety with.   8/29- said anxiety still a major issue- and really limits him- but admits he "doesn't like change". Doesn't feel like Buspar helps/changes a lot.   30- Pulmonary edema vs infiltrate -COVID-19 + 9/1, symptoms started 8/31   - Contact precautions and droplet precautions   - Change albuterol nebulizers to DuoNebs   - Has as needed guaifenesin for cough, as needed Tylenol for fevers   - Per hospital COVID-19 treatment protocol, Paxlovid contraindicated due to Eliquis, not on oxygen so not candidate for other treatments.  Conservative management for now.  9/1: Doing well, cognition improved, monitor and continue current regimen  9/2- d/w wife- she will not take home since ill with COVID- she won't take home until asymptomatic- labs pending this AM- had fever yesterday of 101.9 ~ 2pm and 100.8 last night- afebrile this AM- will con't to monitor and Treat Sx's.    I spent a total of  59  minutes on total care today- >50%  coordination of care- due to  D/w entire team about no d/c- and >15 minutes on phone with wife about she/son will not take him home. -Also doesn't need pain meds when leaves- per pt has none- per wife- has full bottle of meds and got 7/31 and came in 8/3 to hospital.    LOS: 23 days A FACE TO FACE EVALUATION WAS PERFORMED  Kinsey Karch 07/09/2023, 8:48 AM    Addendum:

## 2023-07-09 NOTE — Progress Notes (Signed)
Physical Therapy Session Note  Patient Details  Name: Dylan Bruce MRN: [id number] Date of Birth: 25-Oct-1946  Today's Date: 07/09/2023 PT Individual Time: 1002-1058 PT Individual Time Calculation (min): 56 min   Short Term Goals: Week 3:  PT Short Term Goal 1 (Week 3): Pt will require CGA with bed mobility PT Short Term Goal 2 (Week 3): Pt will require CGA consistently with sit<>stand's from various surface levels PT Short Term Goal 3 (Week 3): Pt will require CGA  with gait x 200 ft  Skilled Therapeutic Interventions/Progress Updates:     Pt received seated in tilt in space WC and agrees to therapy. Reports 7/10 pain in Lt hip. PT provides rest breaks and mobility to manage pain. Pt performs sit to stand with CGA/minA to facilitate anterior weight shift. Pt then compltees marching place to warm up, progressing to alternating foot taps on 8 inch step with CGA and cues for posture and body mechanics. Pt completes x5 with each foot prior to requiring seated rest break. Pt completes x2 additional sets with seatedc rest breaks, with cues at hips and trunk for posture and stability. Following rest break, pt completes sit to stand with CGA and ambulates in room, making multiple 180 degree turns, for total of x80'. Following rest break pt completes additional bout of ambulation, x80' with similar assistance and cues. Left seated in WC with all needs within reach.   Therapy Documentation Precautions:  Precautions Precautions: Fall Restrictions Weight Bearing Restrictions: Yes LLE Weight Bearing: Weight bearing as tolerated   Therapy/Group: Individual Therapy  Beau Fanny, PT, DPT 07/09/2023, 4:28 PM

## 2023-07-10 ENCOUNTER — Other Ambulatory Visit (HOSPITAL_COMMUNITY): Payer: Self-pay

## 2023-07-10 LAB — GLUCOSE, CAPILLARY
Glucose-Capillary: 147 mg/dL — ABNORMAL HIGH (ref 70–99)
Glucose-Capillary: 130 mg/dL — ABNORMAL HIGH (ref 70–99)

## 2023-07-10 MED ORDER — INSULIN LISPRO 100 UNIT/ML (KWIKPEN): 8.0000 [IU] | PEN_INJECTOR | Freq: Three times a day (TID) | SUBCUTANEOUS | Status: DC

## 2023-07-10 MED ORDER — MODAFINIL 100 MG PO TABS
100.0000 mg | ORAL_TABLET | Freq: Every day | ORAL | 0 refills | Status: DC
  Filled 2023-07-10 (×2): qty 30, 30d supply, fill #0

## 2023-07-10 MED ORDER — ADULT MULTIVITAMIN W/MINERALS CH: 1.0000 | ORAL_TABLET | Freq: Every day | ORAL | Status: DC

## 2023-07-10 MED ORDER — BUSPIRONE HCL 15 MG PO TABS
15.0000 mg | ORAL_TABLET | Freq: Three times a day (TID) | ORAL | 0 refills | Status: DC
  Filled 2023-07-10 (×2): qty 90, 30d supply, fill #0

## 2023-07-10 MED ORDER — SENNOSIDES-DOCUSATE SODIUM 8.6-50 MG PO TABS: 2.0000 | ORAL_TABLET | Freq: Two times a day (BID) | ORAL | Status: DC

## 2023-07-10 MED ORDER — LANTUS SOLOSTAR 100 UNIT/ML ~~LOC~~ SOPN: 20.0000 [IU] | PEN_INJECTOR | Freq: Two times a day (BID) | SUBCUTANEOUS | Status: DC

## 2023-07-10 MED ORDER — GUAIFENESIN-DM 100-10 MG/5ML PO SYRP: 10.0000 mL | ORAL_SOLUTION | Freq: Four times a day (QID) | ORAL | Status: DC | PRN

## 2023-07-10 MED ORDER — SODIUM ZIRCONIUM CYCLOSILICATE 5 G PO PACK
5.0000 g | PACK | Freq: Every day | ORAL | 1 refills | Status: DC
  Filled 2023-07-10 (×2): qty 15, 15d supply, fill #0

## 2023-07-10 NOTE — Progress Notes (Signed)
Occupational Therapy Discharge Summary  Patient Details  Name: Dylan Bruce MRN: [id number] Date of Birth: [birth year]  Date of Discharge from OT service:July 10, 2023  Today's Date: 07/10/2023 OT Individual Time: 0800-0900 & 1100-1125 OT Individual Time Calculation (min): 60 min & 25 min    Patient has met 8 of 8 long term goals due to improved activity tolerance, improved balance, and postural control.  Patient to discharge at overall Supervision level.  Patient's care partner is independent to provide the necessary physical assistance at discharge.    Reasons goals not met: N/A  Recommendation:  Patient will benefit from ongoing skilled OT services in home health setting to continue to advance functional skills in the area of BADL and Reduce care partner burden.  Equipment: Pt received RW. Bariatric commode, hip kit and tub, transfer bench  Reasons for discharge: treatment goals met and discharge from hospital  Patient/family agrees with progress made and goals achieved: Yes  OT Discharge Precautions/Restrictions  Precautions Precautions: Fall Restrictions Weight Bearing Restrictions: Yes LLE Weight Bearing: Weight bearing as tolerated Pain Pain Assessment Pain Scale: 0-10 Pain Score: 7  Pain Type: Acute pain Pain Location: Back Pain Orientation: Left Pain Descriptors / Indicators: Aching Pain Frequency: Constant Pain Onset: On-going Pain Intervention(s): Medication (See eMAR) ADL ADL Equipment Provided: Reacher, Sock aid, Long-handled sponge, Long-handled shoe horn Eating: Set up Where Assessed-Eating: Wheelchair Grooming: Supervision/safety Where Assessed-Grooming: Sitting at sink Upper Body Bathing: Supervision/safety Where Assessed-Upper Body Bathing: Shower Lower Body Bathing: Supervision/safety Where Assessed-Lower Body Bathing: Shower Upper Body Dressing: Modified independent (Device) Where Assessed-Upper Body Dressing: Wheelchair Lower Body  Dressing: Supervision/safety Where Assessed-Lower Body Dressing: Sitting at sink, Standing at sink Toileting: Supervision/safety Where Assessed-Toileting: Toilet, Bedside Commode (bariatric BSC over toilet) Toilet Transfer: Close supervision Toilet Transfer Method: Proofreader: Animator Transfer: Close supervison Web designer Method: Ship broker: Insurance underwriter: Close supervision Film/video editor Method: Designer, industrial/product: Sales promotion account executive Baseline Vision/History: 1 Wears glasses (readers) Patient Visual Report: No change from baseline Vision Assessment?: No apparent visual deficits Perception  Perception: Within Functional Limits Praxis Praxis: WFL Cognition Cognition Overall Cognitive Status: Within Functional Limits for tasks assessed Arousal/Alertness: Awake/alert Orientation Level: Person;Situation;Place Person: Oriented Place: Oriented Situation: Oriented Memory: Appears intact Problem Solving: Impaired Problem Solving Impairment: Functional complex Safety/Judgment: Impaired Brief Interview for Mental Status (BIMS) Repetition of Three Words (First Attempt): 3 Temporal Orientation: Year: Correct Temporal Orientation: Month: Accurate within 5 days Temporal Orientation: Day: Correct Recall: "Sock": Yes, no cue required Recall: "Blue": Yes, no cue required Recall: "Bed": Yes, no cue required BIMS Summary Score: 15 Sensation Sensation Light Touch: Impaired Detail Light Touch Impaired Details: Impaired LLE;Impaired RLE (chronic neuropathy) Hot/Cold: Appears Intact Proprioception: Appears Intact Stereognosis: Not tested Coordination Gross Motor Movements are Fluid and Coordinated: No Fine Motor Movements are Fluid and Coordinated: No Coordination and Movement Description: Deficits due to genearlized weakness in BLE; History of R frozen  shoulder, L shoulder arthoplasty, and psoriatric arthirirs Motor  Motor Motor: Abnormal postural alignment and control;Other (comment) Motor - Discharge Observations: BUE movement impaired 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 and impaired DIP d/t psoriatic arthritis contractures Mobility  Bed Mobility Bed Mobility: Rolling Left;Supine to Sit Rolling Right: Supervision/verbal cueing Rolling Left: Supervision/Verbal cueing Supine to Sit: Supervision/Verbal cueing Transfers Sit to Stand: Supervision/Verbal cueing Stand to Sit: Supervision/Verbal cueing  Trunk/Postural Assessment  Cervical Assessment Cervical Assessment: Exceptions to [facility] (forward head) Thoracic  Assessment Thoracic Assessment: Exceptions to [facility] (rounded shoulders) Lumbar Assessment Lumbar Assessment: Exceptions to [facility] (posterior pelvic tilt) Postural Control Postural Control: Deficits on evaluation  Balance Balance Balance Assessed: Yes Static Sitting Balance Static Sitting - Balance Support: Feet supported;Bilateral upper extremity supported Static Sitting - Level of Assistance: 6: Modified independent (Device/Increase time) Dynamic Sitting Balance Dynamic Sitting - Balance Support: Feet supported;Bilateral upper extremity supported Dynamic Sitting - Level of Assistance: 5: Stand by assistance Dynamic Sitting - Balance Activities: Reaching for objects;Forward lean/weight shifting Static Standing Balance Static Standing - Balance Support: Bilateral upper extremity supported Static Standing - Level of Assistance: 5: Stand by assistance Dynamic Standing Balance Dynamic Standing - Balance Support: Bilateral upper extremity supported Dynamic Standing - Level of Assistance: 5: Stand by assistance Extremity/Trunk Assessment RUE Assessment RUE Assessment: Exceptions to [facility] Active Range of Motion (AROM) Comments: ~70* shoulder flexion, WFL distally (at baseline) General Strength Comments: overall  4-/5 RUE AROM (degrees) Overall AROM Right Upper Extremity: Due to premorbid status LUE Assessment Active Range of Motion (AROM) Comments: shoulder 90 degrees, WFL distal (at baseline) General Strength Comments: 4/5 overall LUE Session 1 General: "I am leaving today!" Pt supine in bed upon OT arrival, agreeable to OT session.  Pain:  7/10 pain reported in low back, activity, intermittent rest breaks, distractions provided for pain management, pt reports tolerable to proceed.   ADL: Bed mobility: SBA supine>EOB Grooming: SBA at sink seated in W/C Oral hygiene: SBA for teeth and denture care seated at sink  UB dressing: set-up for overhead shirt LB dressing: SBA using figure 4 method Footwear: total A d/t time constraints for socks and tennis shoes Transfers: SBA with RW for all transfers completed this date  Other Treatments: OT provided handouts with prices and websites provided for hip kits, bariatric commode and tub transfer bench for private purchase.   Pt seated in W/C at end of session with W/C alarm donned, call light within reach and 4Ps assessed.    Session 2 General: "My nose is bleeding." Pt supine in bed upon OT arrival, agreeable to OT session.   Other Treatments: Pt laying in bed with paper towel in nose from nose bleed. Pt assisted with grooming set-up assist to get warm wash cloth and wash face and remove paper towel in nose. Pt educated to hold nose to stop the bleeding for at least 30 seconds. Bleeding stopped and pt washed up face again. Pt and OT discussed D/C and if pt had any lingering questions/comments/concerns. Pt felt as if he is ready for D/C. OT reiterated fall safety techniques and energy conservation strategies, especially with COVID diagnosis. Pt demonstrated verbal understanding. Nursing aware of nose bleed.   Pt supine in bed with bed alarm activated, 2 bed rails up, call light within reach and 4Ps assessed.  Velia Meyer, OTD, OTR/L 07/10/2023, 9:04  AM

## 2023-07-10 NOTE — Progress Notes (Addendum)
INPATIENT REHABILITATION DISCHARGE NOTE   Discharge instructions by: Dois Davenport PA  Verbalized understanding:wife was called  Skin care/Wound care healing?dressing changed today  Pain: pain med given  IV's:d/c'd  Tubes/Drains:none  O2:none  Safety instructions:done  Patient belongings:done   Discharged [id number]  Discharged via car Notes: meds will be picked up at [facility] pharmacy

## 2023-07-10 NOTE — Plan of Care (Signed)
  Problem: RH Eating Goal: LTG Patient will perform eating w/assist, cues/equip (OT) Description: LTG: Patient will perform eating with assist, with/without cues using equipment (OT) Outcome: Completed/Met Flowsheets (Taken 06/17/2023 1235) LTG: Pt will perform eating with assistance level of: Set up assist    Problem: RH Grooming Goal: LTG Patient will perform grooming w/assist,cues/equip (OT) Description: LTG: Patient will perform grooming with assist, with/without cues using equipment (OT) Outcome: Completed/Met Flowsheets (Taken 06/17/2023 1235) LTG: Pt will perform grooming with assistance level of: Set up assist    Problem: RH Bathing Goal: LTG Patient will bathe all body parts with assist levels (OT) Description: LTG: Patient will bathe all body parts with assist levels (OT) Outcome: Completed/Met Flowsheets (Taken 06/17/2023 1235) LTG: Pt will perform bathing with assistance level/cueing: Supervision/Verbal cueing   Problem: RH Dressing Goal: LTG Patient will perform upper body dressing (OT) Description: LTG Patient will perform upper body dressing with assist, with/without cues (OT). Outcome: Completed/Met Flowsheets (Taken 07/10/2023 1616) LTG: Pt will perform upper body dressing with assistance level of: Set up assist Goal: LTG Patient will perform lower body dressing w/assist (OT) Description: LTG: Patient will perform lower body dressing with assist, with/without cues in positioning using equipment (OT) Outcome: Completed/Met Flowsheets (Taken 06/17/2023 1235) LTG: Pt will perform lower body dressing with assistance level of: Supervision/Verbal cueing   Problem: RH Toileting Goal: LTG Patient will perform toileting task (3/3 steps) with assistance level (OT) Description: LTG: Patient will perform toileting task (3/3 steps) with assistance level (OT)  Outcome: Completed/Met Flowsheets (Taken 06/17/2023 1235) LTG: Pt will perform toileting task (3/3 steps) with assistance  level: Supervision/Verbal cueing   Problem: RH Toilet Transfers Goal: LTG Patient will perform toilet transfers w/assist (OT) Description: LTG: Patient will perform toilet transfers with assist, with/without cues using equipment (OT) Outcome: Completed/Met Flowsheets (Taken 06/17/2023 1235) LTG: Pt will perform toilet transfers with assistance level of: Supervision/Verbal cueing   Problem: RH Tub/Shower Transfers Goal: LTG Patient will perform tub/shower transfers w/assist (OT) Description: LTG: Patient will perform tub/shower transfers with assist, with/without cues using equipment (OT) Outcome: Completed/Met Flowsheets (Taken 06/17/2023 1235) LTG: Pt will perform tub/shower stall transfers with assistance level of: Supervision/Verbal cueing

## 2023-07-10 NOTE — Progress Notes (Signed)
Patient has nosebleeds left nare. Cleaned nose with saline and placed 2x2 gauze. Dois Davenport PA notified. Continue to monitor.

## 2023-07-10 NOTE — Progress Notes (Signed)
PROGRESS NOTE   Subjective/Complaints: No new complaints this morning Stable for d/c home today after therapies Patient's chart reviewed- No issues reported overnight Vitals signs stable   ROS:  Denies shortness of breath  Objective:   DG Chest Port 1 View  Result Date: 07/08/2023 CLINICAL DATA:  COVID positive, cough. EXAM: PORTABLE CHEST 1 VIEW COMPARISON:  Chest radiograph dated 07/06/2023. FINDINGS: The heart is enlarged. Vascular calcifications are seen in the aortic arch. Mild diffuse bilateral interstitial opacities are noted. No pleural effusion or pneumothorax. Degenerative changes are seen in the spine. IMPRESSION: Mild diffuse bilateral interstitial opacities may reflect pulmonary edema or atypical pneumonia. Electronically Signed   By: Romona Curls M.D.   On: 07/08/2023 14:35   Recent Labs    07/07/23 1438 07/09/23 0545  WBC 9.5 7.4  HGB 8.6* 8.6*  HCT 28.0* 27.7*  PLT 310 338     Recent Labs    07/08/23 0648 07/09/23 0545  NA 137 135  K 5.2* 5.5*  CL 103 104  CO2 22 18*  GLUCOSE 148* 106*  BUN 48* 51*  CREATININE 1.44* 1.44*  CALCIUM 8.8* 8.9     Intake/Output Summary (Last 24 hours) at 07/10/2023 0943 Last data filed at 07/10/2023 0809 Gross per 24 hour  Intake 237 ml  Output 1950 ml  Net -1713 ml     Pressure Injury 06/16/23 Sacrum Medial Stage 2 -  Partial thickness loss of dermis presenting as a shallow open injury with a red, pink wound bed without slough. small open area on coccyx in middle of gluteal cleft 1x1 cm (Active)  06/16/23 1848  Location: Sacrum  Location Orientation: Medial  Staging: Stage 2 -  Partial thickness loss of dermis presenting as a shallow open injury with a red, pink wound bed without slough.  Wound Description (Comments): small open area on coccyx in middle of gluteal cleft 1x1 cm  Present on Admission: Yes    Physical Exam: Vital Signs Blood pressure (!)  146/73, pulse 70, temperature 98.3 F (36.8 C), resp. rate 16, height 6\' 2"  (1.88 m), weight 97.5 kg, SpO2 99%. Gen: no distress, normal appearing HEENT: oral mucosa pink and moist, NCAT Cardio: Reg rate Chest: normal effort, normal rate of breathing Abd: soft, non-distended Ext: no edema Psych: pleasant, normal affect Skin: intact Oriented to self, place, and time, much improved attention and thought content today. + Bilateral upper extremity intention tremors Moving all 4 limbs antigravity and against resistance.  Ext: no clubbing, cyanosis, or edema.  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 Skin: Right shin wound examined, redressed with Xeroform and gauze.  No apparent drainage or warmth to touch to indicate infection.  2+ edema at the right calf, likely from dressing being too tight.   Prior exams:  R shoulder has ~ 20-25 degrees AROM LUE shoulder has >90 degrees and 4+/5 in LUE RLE- HF proximally 3-/5 and sitally 4/5 LLE- HF 2-/5; KE 2/5; and DF/PF 4/5-unchanged 8/18    Assessment/Plan: 1. Functional deficits which require 3+ hours per day of interdisciplinary therapy in a comprehensive inpatient rehab setting. Physiatrist is providing close team supervision and 24 hour management of active medical  problems listed below. Physiatrist and rehab team continue to assess barriers to discharge/monitor patient progress toward functional and medical goals  Care Tool:  Bathing    Body parts bathed by patient: Right arm, Left arm, Left upper leg, Chest, Front perineal area, Abdomen, Face, Buttocks, Right upper leg, Right lower leg, Left lower leg   Body parts bathed by helper: Right lower leg, Left lower leg     Bathing assist Assist Level: Supervision/Verbal cueing     Upper Body Dressing/Undressing Upper body dressing   What is the patient wearing?: Pull over shirt    Upper body assist Assist Level: Independent with assistive device    Lower  Body Dressing/Undressing Lower body dressing      What is the patient wearing?: Underwear/pull up, Pants     Lower body assist Assist for lower body dressing: Supervision/Verbal cueing     Toileting Toileting    Toileting assist Assist for toileting: Supervision/Verbal cueing     Transfers Chair/bed transfer  Transfers assist     Chair/bed transfer assist level: Supervision/Verbal cueing     Locomotion Ambulation   Ambulation assist   Ambulation activity did not occur: Safety/medical concerns  Assist level: Contact Guard/Touching assist Assistive device: Walker-rolling Max distance: 65'   Walk 10 feet activity   Assist  Walk 10 feet activity did not occur: Safety/medical concerns  Assist level: Contact Guard/Touching assist Assistive device: Walker-rolling   Walk 50 feet activity   Assist Walk 50 feet with 2 turns activity did not occur: Safety/medical concerns  Assist level: Contact Guard/Touching assist Assistive device: Walker-rolling    Walk 150 feet activity   Assist Walk 150 feet activity did not occur: Safety/medical concerns         Walk 10 feet on uneven surface  activity   Assist Walk 10 feet on uneven surfaces activity did not occur: Safety/medical concerns         Wheelchair     Assist Is the patient using a wheelchair?: Yes Type of Wheelchair: Manual    Wheelchair assist level: Dependent - Patient 0%      Wheelchair 50 feet with 2 turns activity    Assist        Assist Level: Dependent - Patient 0%   Wheelchair 150 feet activity     Assist      Assist Level: Dependent - Patient 0%   Blood pressure (!) 146/73, pulse 70, temperature 98.3 F (36.8 C), resp. rate 16, height 6\' 2"  (1.88 m), weight 97.5 kg, SpO2 99%.  Medical Problem List and Plan: 1. Functional deficits secondary to  L intertrochanteric hip fx s/p IM nail WBAT             -patient may  shower             -ELOS/Goals: 10-14 days  supervision             - D/c 9/2 -advised team of likely delay in discharge due to COVID-19 infection over this weekend and loss of therapies while recovering.  Primary team to address final discharge date in a.m.  D/c home today 2.  Atrial fibrillation: continue Eliquis             -antiplatelet therapy: Plavix , change protonix to pepcid as this will help efficacy    3. 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   No complaints of pain today, continue current regimen  8/13- said pain not controlled from hour 3-4- but declined Oxycontin for pain-stopped last dose of Nabumetone- since Cr up so much  8/14- won't change pain meds since more confused per staff- pneumonia vs chronic? Pt asking again for q3 hours prn meds- explained will not do this.   8/15- pt agreed to keep pain meds aq4 hours- educated has been a little confused- pt agreed  8/19-8/20-  pt doing better, but wants to add Cosentyx- as below-   8/25- Cosentyx to be brought in from home--still waiting  8/26- pt said got Cosentyx- not clear in chart or on MAR. We discussed decreasing pain meds to 5x/day, not 6x/day, but he needs ot reduce times asking for it.   8/27- discussed again with pt and nursing- he hasn't reduced his amount of pain meds  8/28- explained ot pt going home on q6 hours pain meds- will get 7 days of meds when he leaves- says doesn't have any pain meds- again asked him to reduce taking pain meds to q5 hours- since will be weaned next week. He voiced understanding    8/31: Significant cognitive delay, suspect infectious etiology but certainly high burden of sedating medications could be contributing; will monitor and adjust these if needed  9-1: Cognition much improved with IV fluids, continue current management  9/2- decrease Oxy to 15 mg q6 hours prn per wife was taking 10 mg Percocet QID not the 20 mg he insists he was taking- at home-   4. Mood/Behavior/Sleep: LCSW to evaluate and provide emotional support             -antipsychotic agents: Vilazodone 40 mg daily             -continue Remeron 30 mg q HS             -continue Provigil 100 mg daily   8/16-   Vistaril to BID prn   5. Neuropsych/cognition: This patient is? (Intermittently confused)  capable of making decisions on his own behalf.   6. Skin/Wound Care: Routine skin care checks             -sacral pressure injury; continue local care/pressure relief             -monitor surgical incision             L elbow and R lower leg wounds- sutures out, glued 8/21- will get sutures out of L wrist and L hip is glued.  9/1 -compared to prior images, right shin wound with more skin loss but appears healthy beefy red tissue underneath; continue management with Xeroform  7. Fluids/Electrolytes/Nutrition: Routine Is and Os and follow-up chemistries             -continue Mag-ox 400 mg daily -we will recheck with labs today   8: Hypertension: monitor TID and prn -continue Imdur 30 mg daily -continue Demadex  20 mg daily   Vitals:   07/09/23 2110 07/10/23 0445  BP:  (!) 146/73  Pulse: 70 70  Resp: 18 16  Temp:  98.3 F (36.8 C)  SpO2: 98% 99%   Fair control 8/18  8/19-8/20 BP controlled- con't regimen  8/21- BP a little elevated this AM 158 systolic- usually 130s-150s- might benefit from hydrochlorothiazide?  8/22- cannot add hydrochlorothiazide since allergic to Probenecid   8/25- BP under fair control--continue regimen  8/26- BP 140s systolci sometimes, but overall controlled  8/28- BP runs  100s to 150s- somewhat labile- don't want to increase BP meds when has low BP sometimes/soft.   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  8/13- CBGs running 199 to 265 in last 24 hours- since Semglee reduced. Don't feel comfortable adding PO meds since Cr up to 1.78 today  CBG (last 3)  Recent Labs    07/09/23 1630 07/09/23 2036 07/10/23 0642  GLUCAP 130* 145* 147*  Reduce semglee , cannot use metformin, also allergies preclude glimipride , consider jardiance, took metformin at home may restart if creat 1.2 or better   8/19- CBG's 122 to 249- needs something, but Jardiance cannot use due to hyperkalemia and Metformin not appropriate due to Variable/labile Cr/BUN- Semglee was reduced yesterday- will give 24 hours to follow trend and treat hyperkalemia.   8/20- CBGs 110-233- will try Tradjenta 5 mg daily since not renally dosed  8/24-well controlled. might need ot change Tradjenta due to cost at d/c.   8/25 increased cbg last night likely d/t lower numbers during day and subsequent attempts to raise sugar--continue current regimen  8/26-8/27 BG's well controled in last 24 hours  8/28- CBGs OK- but eating a LOT of snacks per room where I see a lot of snacks.   8/29- decreased Insulin/Semglee to 20 units BID since such good control- don't want to drop too much- Also restarted metformin and Jardiance- per  pharmacy recs.   8/30- looking good-no adjustments 8-31  9-1: Hypoglycemic overnight 46, came up into the 80s with orange juice, adjust Premeal insulin standing from 12 units to 8 units as it may be over coverage with resistant sliding scale  9/2- CBGS look better 11: Venous stasis: RLE pre-tibial ulcer; continue local wound care    12: S/p left IM nail Dr. [name]             -weight bearing at tolerated   13: Sleep apnea: CPAP at 17 nightly   14: Left rib fractures: pain control and pulmonary toilet   15: Paroxysmal atrial fib/flutter: on Eliquis, Imdur, Demadex (rate controlled no other meds at home)   8/12: EKG with normal ventricular rate, however in atrial flutter, new diagnosis.  Not on rate  controlling medications, cardiology consulted for input.. Getting IVF as below. Patient otherwise stable.   8/14- not new per chart- had pAfib- although pt said not aware (poor STM)- con't since rate appears controlled OFF meds- actually running 50s to 90s  8/15- HR in 50s-60s- so not appropriate for rate meds.   8/19- HR running in the  60s -stable  8-31: EKG with ongoing rate controlled atrial flutter, unchanged.  Mild ST changes likely due to hyperkalemia.  Discussed with cardiology, management as below.  9/2- BP running 140s-150s- but had been controlled monitor trend before changes    07/10/2023    4:45 AM 07/09/2023    9:10 PM 07/09/2023    9:07 PM  Vitals with BMI  Systolic 146    Diastolic 73    Pulse 70 70 70    16: BPH: continue Flomax   17:Psoriasis/psoriatic arthritis: his family brought his Desonide 0.05% cream and clobetasol 0.05% cream to apply to hands, elbows, knees daily as needed             -If can get Cosentyx from home, can give to him- 2 weeks late, will make him hurt too much  8/11- educated pt don't have Cosentyx in hospital- will need ot bring form home  8/19- asked pt to have family bring in Cosentyx- think this will help his pain and risks of infection outweighed by  benefits of pain reduction  8/20- family to bring in   8/23-  ? should be here soon  8/26- pt said he received- but I don't see if documented. 8/27- notes they did bring in, but didn't give to nurse- just had pt take dose.    18: Rosacea: his family brought in his Soolantra (ivermectin) 1% cream to apply to face daily as needed   8/11- ordered by pharmacy today  8/15- meds brought in and sent to pharmacy 19: s/p CABG 2023   20: Osteoarthritis: S/p left shoulder arthroplasty; needs the right shoulder done as well- has R frozen shoulder as a result.    21. 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    8/11- BUN up to 60- very dry- and Cr up some- will give IVFs 60cc/hour after therapy til tomorrow and recheck labs in AM  8/12: Creatinine still increasing 1.99, along with increased potassium and downtrending sodium; increase fluid rate today to 1 L IV fluid at 75/h, urine studies pending, repeat BMP at 2 PM to ensure no further worsening -if creatinine increasing or K increasing, will consult nephrology at that time.  May need bicarbonate.  - Urinalysis normal, urine sodium and serum osmolality appear prerenal, awaiting urine osmolality; K responsive to IVF, continue for 1L, Cr/BUN stable will repeat in AM  8/13- Wil give another 12 hours of NS IVFs 100cc/hour- Cr down to 1.78 and BUN still 84-   8/14- Cr 1.45 down from 1.78 and BUN 67 down from 84- will give 1 more 12 hours of IVFs and recheck in AM   8/16- labs came back late- Cr 1.35 and BUN 48- improving- con't to push PO fluids.   8/19- BUN down to 34 and Cr 1.24- much improved- con't to monitor weekly. Will give Lokelma for K+ 5.2- will recheck in AM  8/20- Got Lokelma but K+ up further to 5.3- spoke with pharmacy- will give Lokelma 10 G BID x 2 doses and give IVF NS- 75cc/hour x  16 hours to start  at 3pm- and recheck in AM- if K+  isn't down in AM, will call renal.   8/21- K+ 5.3 even after lokelma BID- we discussed that wait and recheck in AM- they aren't worried until >5.5- which makes sense- will recheck in AM and go from there- might benefit from hydrochlorothiazide? Cr down to 1.18 and BUN still 41- pt drinking 2 cups/day of water- encouraged to drink 6-8 cups/day.   8/22- Cr down to 1.06 but BUN stable at 41- K+ 5.2 again- will not keep intervening since stable off Lokelma-   8/24-25 off IVFs and recheck labs Monday   8/26- K+ 5.4- and Cr back up to 1.35 and BUN 45- not drinking enough- will give Lokelma x2 and recheck in AM  8/27- BMP pending-   8/28- BMP shows Cr 47f 1.48 and BUN 44- BUN stable- Cr up by 0.15 points- K+ 5.0- better after [facility]- will recheck Thursday  8/29- labs pending- ordered for this AM- not showing that have been drawn yet.   8/30- never drawn yesterday- this AM BUN 37 and Cr 1.30- in spite of receiving Demadex yesterday- K+ 5.6- will give Lokelma-   8-31: BUN and creatinine elevated, potassium 6.2 with EKG changes as above..  On chart review, does not appear patient got Lokelma over the last 3 days.  Has been getting extra dose of torsemide 20 mg last 2 days.    IV fluid at 75 to 50 cc/h for 24 hours   Extra dose of torsemide 20 mg today; consider increasing to 40 mg daily   Encourage compliance with albuterol, which patient has been refusing   Lokelma 5 mg daily added   Recheck BMP this afternoon      9-1: Labs improved, creatinine back to 1.4, approximate baseline over last few checks.  Potassium coming down, 5.2 today.  Continue supplemental IV fluids at 50 cc/h, lokelma 1 additional day.  [facility] labs.  9/2- Labs still pending- at 9am  22. - Walked, but has Spina bifida with frequent falls- will d/w therapy.    23. Leukocytosis - resolved now on last day of abx   8/11- afebrile- feel good- will recheck in AM- if doesn't improve, will check U/A and Cx and CXR- having nurse check wounds  as well- just to make sure-   -8-12: WBCs continue uptrending, 17.5 today, along with thrombocytosis and anemia.  Ordered urinalysis with reflex to culture, chest x-ray, initiate IV fluids as above.   Urinalysis within normal limits   Chest x-ray pending - RLL infiltrate, significant from last CXR, concerning for aspiration pneumonia. Start IV Zosyn 4.75 mg Q8H for 5 days, SLP consult placed for swallow eval. Labs in AM    8/13- WBC down to 10.7 from 17.5- doing much better- con't Zosyn for pneumonia- asked pt to use ICS q2 hours while awake.   - Completed Zosyn afebrile  -8-31: Fever as above.  Appears chest x-ray yesterday with questionable pneumonia.  Will recheck labs and urinalysis, then treat for presumptive pneumonia if no other significant findings.  -9-1: Urinalysis negative.  Patient with respiratory symptoms today, COVID swab + -has as needed Tylenol, adjusted now only for fever treatment to avoid masking.  24. Constipation  8/15- Will give Sorbitol 30cc- if no large BM by then  8/16- Large BM yesterday AM before Sorbitol 8/19- LBM 2 days ago- is hard/feels constipated- will change Colace and Senna to Senna-S and increase to 2 tabs BID  8/22- LBM last night- large- greenish brown- not black 8/25 increased miralax to bid 8/24--had large bm yesterday -Continue senna-s 2 tab bid 8/26- LBM Saturday- will intervene again tomorrow if need be 8/27- large BM last night 8/28- since on Senna and miralax, and pt c/o too many meds, will stop Colace for now 8/29- had 2 Bms yesterday- not quite as loose- off Colace 8/30- doing better-still going daily 8-31: Last bowel movement 8-29, low, requires bowel movements for affect-several would give sorbitol tonight if no BM today Last bowel movement 9-1, large.  25. Anemia  8/15- has dropped, however at the time it was higher, had been hemo-concentrated- and is now doing better- will monitor closely.   8/16- Hb back up to 8.8- doing better  8/22-  Last Hb 8.7- will monitor 26. Anal fissure  8/16- will order/d/w nursing meds from home for anal fissure- since we have in hospital.  27. Osteoporosis-   8/19- pt reports going ot start Fosamax, even before had fx- so needs it! D/w pt today and need for getting it started as soon as leaves. 8/28- pt asking about a bone density test- explained that will need to get PCP to order once got home- it's not done in hospital.  He was upset about this, but explained this is necessary since just had hip fx.  8/29- asking what meds will take- explained was supposed to start Fosamax prior to admission, so will need to start when leaves.  28. Black stools  8/21- ordered hemoccult- hasn't had another BM yet. Also on Iron TID which could play a part- of note, Hb 8.7- was 8.8-9/1- but also is more hydrated- will recheck in AM-   8/23- no more black stools- will recheck Monday 29. Confusion/overtaking benadryl  8/22- got loopy and confused last night- taking benadryl 50 or more mg at a time including vistaril already ordered due to anxiety- will stop Benadryl- con't vistaril for now- educated pt cannot take home meds  8/27- let pt know no meds from home- taking from home again.  30. Anxiety  8/22- will add Buspar 5 mg TID for anxiety and can increase as required- no more benadryl! 8/26- Anxiety better overall- except used Vistaril last night x1.  8/27- still having SOB intermittently- which is his anxiety. Per nursing, took an entire package of benadryl in last few days- will call son/daughter to discuss.  8/28- had CP/SOB again last evening- sounds like was anxiety- tried to take inhaler 3-4x when given yesterday.  -will increase Buspar to 15 mg TID- will not give pt benzo's due to his pain meds chronically and my concern that keeps wanting to take large amounts of any medicine he treats his anxiety with.   8/29- said anxiety still a major issue- and really limits him- but admits he "doesn't like change". Doesn't  feel like Buspar helps/changes a lot.   30- Pulmonary edema vs infiltrate -COVID-19 + 9/1, symptoms started 8/31   - Contact precautions and droplet precautions   - Change albuterol nebulizers to DuoNebs   - Has as needed guaifenesin for cough, as needed Tylenol for fevers   - Per hospital COVID-19 treatment protocol, Paxlovid contraindicated due to Eliquis, not on oxygen so not candidate for other treatments.  Conservative management for now.  9/1: Doing well, cognition improved, monitor and continue current regimen  9/2- d/w wife- she will not take home since ill with COVID- she won't take home until asymptomatic- labs pending this AM- had fever  yesterday of 101.9 ~ 2pm and 100.8 last night- afebrile this AM- will con't to monitor and Treat Sx's.    >30 minutes spent in discharge of patient including review of medications and follow-up appointments, physical examination, and in answering all patient's questions    LOS: 24 days A FACE TO FACE EVALUATION WAS PERFORMED  Drema Pry Vilas Edgerly 07/10/2023, 9:43 AM    Addendum:

## 2023-07-10 NOTE — Progress Notes (Signed)
Physical Therapy Discharge Summary  Patient Details  Name: Dylan Bruce MRN: [id number] Date of Birth: 1945-11-13  Date of Discharge from PT service:July 10, 2023  Today's Date: 07/10/2023 PT Individual Time: 1303-1320 PT Individual Time Calculation (min): 17 min    Patient has met 7 of 8 long term goals due to improved activity tolerance, improved balance, improved postural control, increased strength, increased range of motion, and improved coordination.  Patient to discharge at an ambulatory level Supervision.   Patient's family educated on pt's current mobility status and how to provide supervision assistance at home upon discharge.   Reasons goals not met: W/C goal N/A as pt is functional ambulator   Recommendation:  Patient will benefit from ongoing skilled PT services in home health setting to continue to advance safe functional mobility, address ongoing impairments in balance, coordination, strength, ROM, and minimize fall risk.  Equipment: Rolling walker   Reasons for discharge: treatment goals met and discharge from hospital  Patient/family agrees with progress made and goals achieved: Yes  PT Discharge Pain Interference Pain Interference Pain Effect on Sleep: 2. Occasionally Pain Interference with Therapy Activities: 2. Occasionally Pain Interference with Day-to-Day Activities: 1. Rarely or not at all Cognition Overall Cognitive Status: Within Functional Limits for tasks assessed Arousal/Alertness: Awake/alert Orientation Level: Oriented X4 Memory: Appears intact Awareness: Impaired Problem Solving: Impaired Safety/Judgment: Impaired Sensation Sensation Light Touch: Impaired Detail Light Touch Impaired Details: Impaired LLE;Impaired RLE Proprioception: Appears Intact Additional Comments: Bilateral N/T feet Coordination Gross Motor Movements are Fluid and Coordinated: No Fine Motor Movements are Fluid and Coordinated: No Coordination and Movement  Description: Deficits due to genearlized weakness in BLE; History of R frozen shoulder, L shoulder arthoplasty, and psoriatric arthirirs Finger Nose Finger Test: limited R UE 2/2 decreased strength and ROM deficits Heel Shin Test: [facility] Motor  Motor Motor: Abnormal postural alignment and control Motor - Skilled Clinical Observations: BUE movement impaired 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 and impaired DIP d/t psoriatic arthritis contractures  Mobility Bed Mobility Bed Mobility: Rolling Right;Rolling Left;Supine to Sit;Sit to Supine Rolling Right: Independent with assistive device Rolling Left: Independent with assistive device Supine to Sit: Independent with assistive device Sit to Supine: Independent with assistive device Transfers Transfers: Sit to Stand;Stand to Sit;Stand Pivot Transfers Sit to Stand: Supervision/Verbal cueing Stand to Sit: Supervision/Verbal cueing Stand Pivot Transfers: Supervision/Verbal cueing Transfer (Assistive device): Rolling walker Locomotion  Gait Ambulation: Yes Gait Assistance: Supervision/Verbal cueing Gait Distance (Feet): 150 Feet (feet) Assistive device: Rolling walker Gait Assistance Details: Other (comment) Gait Assistance Details: verbal cues for posture Gait Gait: Yes Gait Pattern: Impaired Gait Pattern: Antalgic Gait velocity: decreased Stairs / Additional Locomotion Stairs: Yes Stairs Assistance: Minimal Assistance - Patient > 75% Stair Management Technique: Two rails Number of Stairs: 1 Height of Stairs: 6 (inches) Ramp: Supervision/Verbal cueing Curb: Minimal Assistance - Patient >75% Pick up small object from the floor assist level: Minimal Assistance - Patient > 75% Pick up small object from the floor assistive device: Engineer, manufacturing Wheelchair Mobility: No  Trunk/Postural Assessment  Cervical Assessment Cervical Assessment: Exceptions to [facility] (forward head) Thoracic Assessment Thoracic  Assessment: Exceptions to [facility] (rounded shoulders) Lumbar Assessment Lumbar Assessment: Exceptions to [facility] (posterior pelvic tilt) Postural Control Postural Control: Within Functional Limits  Balance Balance Balance Assessed: Yes Static Sitting Balance Static Sitting - Balance Support: Feet supported;Bilateral upper extremity supported Static Sitting - Level of Assistance: 7: Independent Dynamic Sitting Balance Dynamic Sitting - Balance Support: Feet supported;Bilateral upper  extremity supported Dynamic Sitting - Level of Assistance: 6: Modified independent (Device/Increase time) Dynamic Sitting - Balance Activities: Reaching for objects;Forward lean/weight shifting Static Standing Balance Static Standing - Balance Support: Bilateral upper extremity supported Static Standing - Level of Assistance: 5: Stand by assistance (supervision) Dynamic Standing Balance Dynamic Standing - Balance Support: Bilateral upper extremity supported;During functional activity Dynamic Standing - Level of Assistance: 5: Stand by assistance (supervision) Dynamic Standing - Balance Activities: Lateral lean/weight shifting Extremity Assessment  RLE Assessment RLE Assessment: Within Functional Limits General Strength Comments: grossly 4/5 MMT LLE Assessment LLE Assessment: Exceptions to [facility] Strength Comments: Grossly 3/5 MMT    Pt awaiting discharge from CIR following PT session. PT assessed pain interference, sensation, coordination, strength, and balance in preparation for discharge. Pt (S) with sit to stand and ambulatory transfer to TIS and return to seated edge of bed. Pt declined to finish session as he reports he would like to go home. Nursing notified and pt left with all needs in reach and alarm on.   Truitt Leep Truitt Leep PT, DPT  07/10/2023, 1:39 PM

## 2023-07-10 NOTE — Progress Notes (Signed)
Inpatient Rehabilitation Discharge Medication Review by a Pharmacist  A complete drug regimen review was completed for this patient to identify any potential clinically significant medication issues.  High Risk Drug Classes Is patient taking? Indication by Medication  Antipsychotic Yes Viibryd- MDD  Anticoagulant Yes Apixaban- pAF  Antibiotic Yes Nystatin cr- sacral/buttocks yeast infection  Opioid Yes Percocet- acute pain  Antiplatelet Yes Plavix- cva ppx  Hypoglycemics/insulin Yes Jardiance, insulin, metformin- T2DM  Vasoactive Medication Yes Imdur, demadex- HTN nitroSL- CP Flomax- BPH  Chemotherapy No   Other Yes Zetia- HLD Lipitor- HLD Nexium- GERD Buspar- anxiety Lyrica- neuropathic pain Remeron- depression/sleep Provigil- alertness Cosentyx- plaque psoriasis Uloric- gout     Type of Medication Issue Identified Description of Issue Recommendation(s)  Drug Interaction(s) (clinically significant)     Duplicate Therapy     Allergy     No Medication Administration End Date     Incorrect Dose     Additional Drug Therapy Needed     Significant med changes from prior encounter (inform family/care partners about these prior to discharge).    Other       Clinically significant medication issues were identified that warrant physician communication and completion of prescribed/recommended actions by midnight of the next day:  No   Time spent performing this drug regimen review (minutes):  30   Shogo Larkey BS, PharmD, BCPS Clinical Pharmacist 07/10/2023 10:14 AM  Contact: [phone] after 3 PM  "Be curious, not judgmental..." -Debbora Dus

## 2023-08-01 ENCOUNTER — Ambulatory Visit: Payer: Commercial Managed Care - PPO | Attending: Internal Medicine | Admitting: Internal Medicine

## 2023-08-01 NOTE — Progress Notes (Deleted)
Cardiology Office Note:  .   Date:  08/01/2023  ID:  Dylan Bruce, DOB 19-Mar-1946, MRN [id number] PCP: Lindwood Qua, MD  [facility] Providers Cardiologist:  Maisie Fus, MD Structural Heart:  Verne Carrow, MD{ Click to update primary MD,subspecialty MD or APP then REFRESH:1}   History of Present Illness: .   Dylan Bruce is a 77 y.o. 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 He was last seen by Cline Crock in June. He was in atrial flutter and DCCV was offered. However since he was asymptomatic, he declined.  He was hospitalized in 07/10/2023. Prior to this he had a hip fx 8/3 after a fall and left rib fracture.Underwent left IM nail by Dr [name] on 8/04.  He was in 4:1 atrial flutter. He had a prolonged rehab course. Cardiology was engaged at one point for hyperkalemia and EKG changes and it was recommended that he have this corrected.   Today he is coming in for follow-up.   ROS:  per HPI otherwise negative   Studies Reviewed: Marland Kitchen        TTE 60-65%, RV fxn is normal LA size 35 cc/m2 Mild MR Normal TAVR  29 mm Sapien prosthesis, V max 1.53 m/s Ascending aorta dilation 41 mm Risk Assessment/Calculations:        Physical Exam:   VS:  There were no vitals taken for this visit.   Wt Readings from Last 3 Encounters:  07/08/23 214 lb 15.2 oz (97.5 kg)  06/09/23 198 lb 13.7 oz (90.2 kg)  05/04/23 209 lb 3.2 oz (94.9 kg)    GEN: Well nourished, well developed in no acute distress NECK: No JVD; No carotid bruits CARDIAC: ***RRR, no murmurs, rubs, gallops RESPIRATORY:  Clear to auscultation without rales, wheezing or  rhonchi  ABDOMEN: Soft, non-tender, non-distended EXTREMITIES:  No edema; No deformity   ASSESSMENT AND PLAN: .   Severe AS s/p TAVR: echo today shows EF 65%, normally functioning TAVR with a mean gradient of 5.8 mm hg and no PVL. He has NYHA class I symptoms. He has Amoxicillin 2g for SBE prophylaxis. Continue on Eliquis and plavix. I will see him back in 1 year for follow up with echo.    CAD s/p CABG:  Last R/LHC in 01/2023 showed 2/4 patent grafts (LIMA to LAD and SVG to OM) with known occlusion of SVG to distal RCA and SVG to 1st Diag. There were no focal targets for PCI and continued medical therapy was recommended. Continue Imdur 30mg  daily, Plavix monotherapy (no aspirin given need for full anticoagulation), statin/ Zetia. LDL 50 mg/dL at goal 7/82/9562   Chronic Diastolic CHF: appears euvolemic. Continue on Torsemide 40mg  daily and Jardiance 10mg  daily.    Persistent Atrial Flutter: this was newly diagnosed after TAVR. Rate well controlled off AV nodal blocking agents. Continue Eliquis 5 twice daily. Remains in atrial flutter today. I offered to set up DCCV but he feels well at this time and declines this.    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 Continue Plavix monotherapy. Aspirin stopped during recent admission due to need for full anticoagulation. Continue statin/ Zetia. LDL at goal per above.   HTN: BP well controlled.    CKD Stage III: baseline creatinine around 1.7. Creatinine stable at 1.30 on 04/10/23    {Are you ordering a CV Procedure (e.g. stress test, cath, DCCV, TEE, etc)?   Press F2        :[id number]}  Dispo: ***  Signed, Maisie Fus, MD

## 2023-08-02 ENCOUNTER — Encounter: Payer: Self-pay | Admitting: Internal Medicine

## 2023-08-13 ENCOUNTER — Encounter: Payer: Self-pay | Admitting: Physical Medicine and Rehabilitation

## 2023-08-13 ENCOUNTER — Encounter
Payer: Commercial Managed Care - PPO | Attending: Physical Medicine and Rehabilitation | Admitting: Physical Medicine and Rehabilitation

## 2023-08-13 VITALS — BP 119/56 | HR 70 | Ht 74.0 in | Wt 184.0 lb

## 2023-08-13 DIAGNOSIS — T7840XD Allergy, unspecified, subsequent encounter: Secondary | ICD-10-CM | POA: Insufficient documentation

## 2023-08-13 DIAGNOSIS — N1832 Chronic kidney disease, stage 3b: Secondary | ICD-10-CM | POA: Insufficient documentation

## 2023-08-13 DIAGNOSIS — S2242XD Multiple fractures of ribs, left side, subsequent encounter for fracture with routine healing: Secondary | ICD-10-CM | POA: Insufficient documentation

## 2023-08-13 DIAGNOSIS — T7840XA Allergy, unspecified, initial encounter: Secondary | ICD-10-CM | POA: Insufficient documentation

## 2023-08-13 DIAGNOSIS — S72002A Fracture of unspecified part of neck of left femur, initial encounter for closed fracture: Secondary | ICD-10-CM | POA: Diagnosis not present

## 2023-08-13 NOTE — Patient Instructions (Addendum)
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   2. Can try Benadryl- wouldn't do more than 25 mg benadryl at a time- see which helps more   3. Speak to Dr Mikey Bussing about a steroid lotion- to see if can apply directly to rash  4. Discussed time to get off RW- I think would be a minimum 6-12 months- but might need it permanently  5. Is taking his Cosentyx- for psoriatic arthritis - maybe cause of increased pain along with colder weather.    6. Voltaren/Diclofenac  gel can use up to 4x/day- on intact skin- I suggest to use on joints close to skin.    7.  F/U as needed  8. Needs to call  Dr  Teryl Lucy to get f/u after L hip fracture. [phone]- call to make appt-  Surgery was 06/10/23- at [facility]- I think the next one over.

## 2023-08-13 NOTE — Progress Notes (Signed)
Subjective:    Patient ID: Dylan Bruce, male    DOB: 07-Aug-1946, 77 y.o.   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  4 weeks ago and fell out of bed one night- and jarred self.  Didn't break anything, but jarred self- set him back.   Doing H/H  2-3x/week.  Doing PT, but hasn't seen OT yet- also has home nursing.    Had an allergic reaction to an ABX- so has rash.  Dr Osie Bond- for cellulitis of RLE-     Dr Mikey Bussing- hasn't written for pain meds-  Because got 7 days leaving the hospital.  Also using tylenol-  Didn't bring in the Oxycodone 5 mg-  But says he has several meds left of Oxycodone 5 mg at home- doesn't need  any Rx- wife in control of pain meds.   Occ lately takng 4-5 pills/day.  Lately been taking 2 pills at a time.  Since had fall, been c/o more pain.   Wound on RLE is infected- getting a referral to go to wound clinic in [place] [facility]- has appt with Dr Neita Garnet at [facility] put on ABX- and broke out in rash Put on Atarax- for rash- scared about being allergic to it since rash getting worse.     Said since reduced pain meds, feels more alert.  Was put in hospital 3-4 weeks ago and wasn't given pain meds- and latest hospital d/c was 10/3- in and out because RLE.    Now walking- using RW to walk- 50 ft at a time.  Has good deck to walk on.    Ankles, feet and knees so tender and sore- since Saturday.    Pain Inventory Average Pain 6 Pain  Right Now 7 My pain is burning, dull, stabbing, and aching  In the last 24 hours, has pain interfered with the following? General activity 6 Relation with others 8 Enjoyment of life 9 What TIME of day is your pain at its worst? daytime and evening Sleep (in general) Poor  Pain is worse with: walking and standing Pain improves with: rest and medication Relief from Meds: 6  walk with assistance use a walker ability to climb steps?  no do you drive?  no use a wheelchair needs help with transfers  retired I need assistance with the following:  dressing, bathing, meal prep, and household duties  bladder control problems weakness numbness tremor trouble walking confusion depression anxiety  Any changes since last visit?  no  Any changes since last visit?  no    Family History  Problem Relation Age of Onset   Valvular heart disease Father    Prostate cancer Father    Social History   Socioeconomic History   Marital status: Married    Spouse name: Not on file   Number of children: 2   Years of education: Not on file   Highest education level: Not on file  Occupational History   Occupation: Retired-Owned a Administrator, sports  Tobacco Use   Smoking status: Never   Smokeless tobacco: Never  Substance and Sexual Activity   Alcohol use: Not Currently   Drug use: No   Sexual activity: Not Currently  Other Topics Concern   Not on file  Social History Narrative   Not on file   Social Determinants of Health   Financial Resource Strain: Low Risk  (08/06/2023)   Received from [facility]   Overall Financial Resource Strain (CARDIA)    Difficulty of Paying Living Expenses: Not very hard  Recent Concern: Financial Resource Strain - Medium Risk (05/25/2023)   Received from [facility]   Overall Financial Resource Strain (CARDIA)    Difficulty of Paying Living Expenses: Somewhat hard  Food Insecurity: No Food Insecurity (08/06/2023)   Received from [facility]   Hunger Vital Sign    Worried About Running Out of Food in the Last Year: Never true    Ran Out of Food in the Last Year: Never true  Transportation Needs: No Transportation Needs (08/06/2023)   Received from [facility] - Transportation    Lack of Transportation (Medical): No    Lack of Transportation (Non-Medical): No  Physical Activity: Inactive (05/25/2023)   Received from [facility]   Exercise Vital Sign    Days of Exercise per Week: 0 days    Minutes of Exercise per Session: 0 min  Stress: Stress Concern Present (05/25/2023)   Received from [facility] of Occupational Health - Occupational Stress Questionnaire    Feeling of Stress : To some extent  Social Connections: Socially Integrated (05/25/2023)   Received from [facility]   Social Connection and Isolation Panel [NHANES]    Frequency of Communication with Friends and Family: More than three times a week    Frequency of Social Gatherings with Friends and Family: More than three times a week    Attends Religious Services: More than 4 times per year    Active Member of [employer] or Organizations: Yes    Attends Engineer, structural: More than 4 times per year    Marital Status: Married   Past Surgical History:  Procedure Laterality Date   BACK SURGERY     CARPAL TUNNEL RELEASE Left 08/21/2019   Procedure: CARPAL TUNNEL RELEASE;  Surgeon: Cindee Salt, MD;  Location: [facility];  Service: Orthopedics;  Laterality: Left;  AXILLARY BLOCK   CORONARY ARTERY BYPASS GRAFT     INTRAMEDULLARY (IM) NAIL INTERTROCHANTERIC Left 06/10/2023   Procedure: INTRAMEDULLARY (IM) NAIL INTERTROCHANTERIC;  Surgeon: Teryl Lucy, MD;  Location: [facility] OR;  Service: Orthopedics;  Laterality: Left;   INTRAOPERATIVE TRANSTHORACIC ECHOCARDIOGRAM N/A 02/21/2023   Procedure: INTRAOPERATIVE TRANSTHORACIC ECHOCARDIOGRAM;  Surgeon: Kathleene Hazel, MD;  Location: [facility] INVASIVE CV  LAB;  Service: Open Heart Surgery;  Laterality: N/A;   JOINT REPLACEMENT Left    shoulder   LEFT HEART CATH AND CORS/GRAFTS ANGIOGRAPHY N/A 06/18/2018   Procedure: LEFT HEART CATH AND CORS/GRAFTS ANGIOGRAPHY;  Surgeon: Runell Gess, MD;  Location: [facility] INVASIVE CV LAB;  Service: Cardiovascular;  Laterality: N/A;   RIGHT/LEFT HEART CATH AND CORONARY/GRAFT ANGIOGRAPHY N/A 01/11/2023   Procedure: RIGHT/LEFT HEART CATH AND CORONARY/GRAFT ANGIOGRAPHY;  Surgeon: Kathleene Hazel, MD;  Location: [facility]AS[facility];  Service: Cardiovascular;  Laterality: N/A;   TRANSCATHETER AORTIC VALVE REPLACEMENT, TRANSFEMORAL Left 02/21/2023   Procedure:  Transcatheter Aortic Valve Replacement, Transfemoral;  Surgeon: Kathleene Hazel, MD;  Location: [facility]AS[facility];  Service: Open Heart Surgery;  Laterality: Left;   TRIGGER FINGER RELEASE Left 08/21/2019   Procedure: RELEASE TRIGGER LEFT SMALL FINGER LEFT INDEX;  Surgeon: Cindee Salt, MD;  Location: [facility];  Service: Orthopedics;  Laterality: Left;   ULNAR NERVE TRANSPOSITION Left 08/21/2019   Procedure: DECOMPRESSION WITH ULNAR NERVE LEFT CUBITAL TUNNEL ULNAR;  Surgeon: Cindee Salt, MD;  Location: [facility];  Service: Orthopedics;  Laterality: Left;   Past Medical History:  Diagnosis Date   Aortic stenosis    ARTHRITIS    ASTHMA    CAD (coronary artery disease)    CHF (congestive heart failure) (HCC)    DM    GERD    HYPERLIPIDEMIA    Hypertension    PSORIASIS    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 Clifton James and Dr. Delia Chimes   SLEEP APNEA    SPINA BIFIDA    BP (!) 119/56   Pulse 70   Ht 6\' 2"  (1.88 m)   Wt 184 lb (83.5 kg)   SpO2 100%   BMI 23.62 kg/m   Opioid Risk Score:   Fall Risk Score:  `1  Depression screen [facility] 2/9     08/13/2023    1:03 PM  Depression screen PHQ 2/9  Decreased Interest 2  Down, Depressed, Hopeless 3  PHQ -  2 Score 5  Altered sleeping 3  Tired, decreased energy 3  Change in appetite 2  Feeling bad or failure about yourself  3  Trouble concentrating 3  Moving slowly or fidgety/restless 2  Suicidal thoughts 1  PHQ-9 Score 22  Difficult doing work/chores Extremely dIfficult     Review of Systems  Respiratory:  Positive for apnea and shortness of breath.   Gastrointestinal:  Positive for constipation and diarrhea.  Musculoskeletal:  Positive for back pain and gait problem.  Neurological:  Positive for tremors, weakness and numbness.  Psychiatric/Behavioral:  Positive for confusion and dysphoric mood. The patient is nervous/anxious.   All other systems reviewed and are negative.     Objective:   Physical Exam  Awake more alert, more appropriate, NAD  Rash and white patches- all the way up in Groin/upper thighs and over entire abdomen/and chest, and back/scapulae- and entire arm B/L   L hip healed from incision  Cannot test gait because didn't bring RW here.    RLE shin dressing- looks cellulitic around dressing- down into Shoe- R ankle.            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   2. Can try Benadryl- wouldn't do more than 25 mg benadryl at a time- see which helps more   3. Speak to Dr Mikey Bussing about a steroid lotion- to see if can apply directly to rash  4. Discussed time to get off RW- I think would be a minimum 6-12 months- but might need it  permanently  5. Is taking his Cosentyx- for psoriatic arthritis - maybe cause of increased pain along with colder weather.    6. Voltaren/Diclofenac  gel can use up to 4x/day- on intact skin- I suggest to use on joints close to skin.    7.  F/U as needed  8. Needs to call Dr [name] to get f/u after L hip fracture.  Surgery was 06/10/23- number is [phone]   I spent a total of  42  minutes on total care today- >50% coordination of care- due to  discussion about cellulitis, how to treat, suggestions, and how to work on pain issues.

## 2023-08-27 ENCOUNTER — Ambulatory Visit (HOSPITAL_BASED_OUTPATIENT_CLINIC_OR_DEPARTMENT_OTHER): Payer: Commercial Managed Care - PPO | Admitting: General Surgery

## 2023-08-27 NOTE — Progress Notes (Signed)
VAHE, DUPRIEST ([id number]) [id number] Page 1 of 1 Visit Report for 08/27/2023 Allergy List Details Patient Name: Date of Service: Dylan Bruce, Dylan Bruce 08/27/2023 12:45 PM Medical Record Number: [id number] Patient Account Number: [id number] Date of Birth/Sex: Treating RN: Nov 15, 1945 (77 y.o. [name] Primary Care Cicilia Clinger: Other Clinician: Referring Trish Mancinelli: Treating Hence Derrick/Extender: Sherryl Manges in Treatment: 0 Allergies Active Allergies cefuroxime ibuprofen Iodinated Contrast Media cefpodoxime ciprofloxacin doxycycline Sulfamethoxazole-Trimethoprim fluocinolone acetonide probenecid allopurinol azithromycin Sulfa (Sulfonamide Antibiotics) Allergy Notes Electronic Signature(s) Signed: 08/27/2023 10:33:56 AM By: Samuella Bruin Entered By: Samuella Bruin on 08/24/2023 08:31:28

## 2023-09-05 ENCOUNTER — Ambulatory Visit (HOSPITAL_BASED_OUTPATIENT_CLINIC_OR_DEPARTMENT_OTHER): Payer: Commercial Managed Care - PPO | Admitting: Physician Assistant

## 2023-09-25 ENCOUNTER — Ambulatory Visit (HOSPITAL_BASED_OUTPATIENT_CLINIC_OR_DEPARTMENT_OTHER): Payer: Commercial Managed Care - PPO | Admitting: Internal Medicine

## 2023-11-12 ENCOUNTER — Ambulatory Visit: Payer: Commercial Managed Care - PPO | Admitting: Internal Medicine

## 2023-11-19 NOTE — Discharge Summary (Signed)
 ------------------------------------------------------------------------------- Attestation signed by Ilah Lamar BROCKS, MD at 11/19/23 1207 I saw and evaluated the patient, participating in the key portions of the service on the day of discharge.  I reviewed the residents note and agree with the discharge plans and disposition. I personally spent 30 minutes in discharge planning services.   Lamar BROCKS Ilah, MD  -------------------------------------------------------------------------------   Physician Discharge Summary [facility] 3 [facility] [address] [place] [address] Dept: [phone] Loc: [phone]   Identifying Information:  Dylan Bruce Dec 01, 1945 [id number]  Primary Care Physician: Dylan Cheryal Heinz, MD   Code Status: Full Code  Admit Date: 10/26/2023  Discharge Date: 11/19/2023   Discharge To: Skilled nursing facility  Discharge Service: [facility] - Cardiology Floor Team 2 [facility])   Discharge Attending Physician: Lamar BROCKS Ilah, MD  Discharge Diagnoses:  Active Problems:   Wound of left lower extremity (POA: Unknown)   Wound of right lower extremity (POA: Unknown)   NSTEMI (non-ST elevated myocardial infarction) (CMS-HCC) (POA: Yes) Resolved Problems:   * No resolved hospital problems. *  Outpatient Provider Follow Up Issues:  - BMP and magnesium  on 1/17, goal K >=4 and Mg >=2 - consider increase in diuresis to BID torsemide  for increased work of breathing - ongoing wound care and rehab - increase ozempic as tolerating and determine if insulin  needs decreasing with this  Hospital Course:  Dylan Bruce is a 78 y.o. male with PMHx of CAD s/p CABG, prior MI, severe aortic stenosis s/p TAVR, HTN, HLD, poorly controlled insulin  dependent T2DM, paroxysmal afib/flutter on Eliquis  and Plavix . He presented to the hospital with four months of chronic nonhealing wound to the RLE.   He was admitted to the vascular surgery service and started on a  heparin  drip. Home Eliquis  and Plavix  were held. He underwent MRI of the RLE that revealed osteomyelitis of the fifth metatarsal. 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 ID was consulted and he was transitioned to daptomycin, cefepime, and flagyl. Cefepime discontinued due to rash. TTE negative for vegetations. Repeat cultures 12/23 NGTD at 5 days. After discussion with patient and family on R AKA vs. Long term IV abx and local wound care, they elected for R AKA.   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 The procedure itself was uneventful and without complications. He tolerated the procedure well, was extubated in the OR, and received routine post-operative care before being transferred to the floor.   In 11/10/23, rapid response team was called twice during the night shift due to altered mental status, diaphoresis, increased  work of breathing, and overall clinical deterioration. He was transferred to the ICU for further management.  Full workup was ordered, which reviewed initial troponin of 904, trending up.  Cardiology consult was then requested.  On 11/11/23 morning, around 10:30 AM, patient started presenting chest pain along with a troponin of 5500.  Due to the diagnosis of NSTEMI, Expediently taken to Cath Lab on 1/5, found to have 2 patent grafts as well as complex multivessel disease but no clear target lesion identified. As he recently received apixaban , benefit of complex PCI not clearly outweighing risks.  He is care was then transferred to [facility] cardiology team for further care and ongoing evaluation for complex PCI.   Ultimately, after discussion with the interventional team and patient, we deferred complex PCI in favor of medical optimization and given improvement with this he can continue to follow outpatient for consideration of this.  Medical management by problem as below:   NSTEMI managed medically Complex CAD s/p CABG Remote CABG, developed chest pain and  diaphoresis on 1/5 with trop peak of 4k.    Cath with complex multivessel disease and no clear culprit lesion, PCI deferred given high risk and recent DOAC. EF normal in December, but now 40%. Chest pain free for several days, so planning for outpatient follow up to determine longitudinal symptomatology. Given AF, will continue plavix  + apixaban . Increased metoprolol  to 50 succinate daily, and Increased Imdur  to 90 mg daily.    Ischemic cardiomyopathy, EF 40% Newly reduced EF 40% following MI. GDMT at discharge included metoprolol  succinate 50, spiro 25, loasrtan 25, and jardiance  25. Repeat echo and uptitration of GDMT per outpatient cardiologist. Discharged with torsemide  60 daily, with a low threshold to increase to BID with increased work of breathing.  DM2 Poor control as outpatient, some hypoglycemia while inpatient due to poor p.o. intake.  Endocrinology consulted during admission, and recommended mealtime and snack lispro in addition to his long-acting.  Regimen at discharge was 40 lantus  nightly, 14 lispro mealtime, and SSI. Restarted ozempic at dose reduction which should be increased as tolerating  VRE/MRSA bacteremia Source control obtained with amputation, received 7d postoperative treatment per ID with aztreonam/dapto/metronidazole thru 1/9.  Severe AS s/p TAVR Normal prosthetic function.  Post surgical/phantom limb pain Multimodal regimen with oxy 10 (short outpaitent course provided), tylenol  1g TID, voltaren, and lyrica  200.   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 Loss: > 20% x 1 year Fat Loss: Severe Muscle Loss: Severe Malnutrition Score: 4    Touchbase with Outpatient Provider: Warm Handoff: Completed on 11/19/23 by Jayson Garner, MD  (Resident) via [facility] Message  Procedures: AKA,  L heart cath without PCI ______________________________________________________________________ Discharge Medications:    Your Medication List     STOP taking these medications    BD ULTRA-FINE NANO PEN NEEDLE 32 gauge x 5/32 (4 mm) Ndle Generic drug: pen needle, diabetic   cefTRIAXone Solr Commonly known as: ROCEPHIN   famotidine  20 MG tablet Commonly known as: PEPCID    fluconazole 200 MG tablet Commonly known as: DIFLUCAN   SEMGLEE (INSULIN  GLARG-YFGN)PEN 100 unit/mL (3 mL) Inpn Generic drug: insulin  glargine-yfgn       START taking these medications    insulin  glargine 100 unit/mL injection Commonly known as: LANTUS  Inject 0.4 mL (40 Units total) under the skin nightly.   losartan 25 MG tablet Commonly known as: COZAAR Take 1 tablet (25 mg total) by mouth daily.   metoPROLOL  succinate 50 MG 24 hr tablet Commonly known as: TOPROL  XL Take 1 tablet (50 mg total) by mouth daily.   pantoprazole  40 MG tablet Commonly known as: Protonix  Take 1 tablet (40 mg total) by mouth daily before breakfast.   senna 8.6 mg tablet Commonly known as: SENOKOT Take 2 tablets by mouth nightly.   simethicone  80 MG chewable tablet Commonly known as: MYLICON Chew 1 tablet (80 mg total) every six (6) hours as needed.   spironolactone 25 MG tablet Commonly known as: ALDACTONE Take 1 tablet (25 mg total) by mouth daily.       CHANGE how you take these medications    acetaminophen  325 MG tablet Commonly known as: TYLENOL  Take 3 tablets (975 mg total) by mouth every eight (8) hours. What changed:  how much to take when to take this   empagliflozin  25 mg tablet Commonly known as: JARDIANCE  Take 1 tablet (25 mg total) by mouth daily. What changed:  medication strength how much to take when to take this   HumaLOG  KwikPen Insulin  100 unit/mL injection pen Generic drug: insulin  lispro Inject under the skin Four (4) times a day with a meal and nightly. sliding scale;  151-200= 2 units; 201-250= 4 units; 251-300= 6 units; 301-350= 8 units; 351-400= 10 units.  If >400 give 10 units and re check in 1 hour, if still > 400 notify MD.  Before meals and at bedtime What changed: Another medication with the same name was changed. Make sure you understand how and when to take each.   HumaLOG  KwikPen Insulin  100 unit/mL injection pen Generic drug: insulin  lispro INJECT 14 UNITS UNDER THE SKIN THREE TIMES A DAY BEFORE MEALS PLUS CORRECTION FOR ELEVATED BLOOD SUGAR. INJECT 6 UNITS WITH SNACKS. What changed: See the new instructions.   isosorbide  mononitrate 30 MG 24 hr tablet Commonly known as: IMDUR  Take 3 tablets (90 mg total) by mouth daily. What changed:  how much to take when to take this   oxyCODONE  10 mg immediate release tablet Commonly known as: ROXICODONE  Take 1 tablet (10 mg total) by mouth every four (4) hours as needed for up to 5 days. What changed:  medication strength how much to take when to take this   OZEMPIC 1 mg/dose (4 mg/3 mL) Pnij injection Generic drug: semaglutide Inject 0.5 mg under the skin every seven (7) days. What changed: how much to take   pregabalin  200 MG capsule Commonly known as: LYRICA  Take 1 capsule (200 mg total) by mouth three (3) times a day. What changed:  medication strength how much to take   torsemide  40 mg Tab Take 1.5 tablets (60 mg total) by mouth daily. What changed: how much to take       CONTINUE taking these medications    albuterol  90 mcg/actuation inhaler Commonly known as: PROVENTIL  HFA;VENTOLIN  HFA Inhale 2 puffs every four (4) hours as needed for wheezing.   albuterol  2.5 mg /3 mL (0.083 %) nebulizer solution Inhale 3 mL (2.5 mg total) by nebulization every four (4) hours as needed for wheezing.   ANUCORT-HC  25 mg suppository Generic drug: hydrocortisone  INSERT 1 SUPPOSITORY RECTALLY TWICE A DAY AS NEEDED FOR HEMORRHOIDS   apixaban  5 mg Tab Commonly known as: ELIQUIS  Take 1  tablet (5 mg total) by mouth two (2) times a day.   ascorbic acid (vitamin C) 500 MG tablet Commonly known as: VITAMIN C Take 1 tablet (500 mg total) by mouth daily.   atorvastatin  80 MG tablet Commonly known as: LIPITOR TAKE 1 TABLET DAILY   calcium  polycarbophil 625 mg tablet Commonly known as: FIBERCON Take 1 tablet (625 mg total) by mouth daily.   clobetasol  0.05 % topical foam Commonly known as: OLUX  Apply topically daily as needed. Apply to scalp daily prn   clopidogrel  75 mg tablet Commonly known as: PLAVIX  Take 1 tablet (75 mg total) by mouth daily.   COSENTYX PEN (2 PENS) 150 mg/mL Pnij injection Generic drug: secukinumab Inject 2 mL (300 mg total) under the skin every thirty (30) days.   cyclobenzaprine  10 MG tablet Commonly known as: FLEXERIL  Take 1 tablet (10 mg total) by mouth nightly as needed for muscle spasms.   desonide  0.05 % cream Commonly known as: DESOWEN  Apply topically Two (2) times a day.  docusate sodium  100 MG capsule Commonly known as: COLACE Take 1 capsule (100 mg total) by mouth at bedtime.   esomeprazole 40 MG capsule Commonly known as: NEXIUM TAKE 1 CAPSULE DAILY   ezetimibe  10 mg tablet Commonly known as: ZETIA  Take 1 tablet (10 mg total) by mouth daily.   febuxostat  40 mg tablet Commonly known as: ULORIC  Take 1 tablet (40 mg total) by mouth daily.   ferrous sulfate  325 (65 FE) MG EC tablet Take 1 tablet (325 mg total) by mouth two (2) times a day.   FREESTYLE LIBRE 2 SENSOR kit by Other route every fourteen (14) days.   glucagon 0.5 mg/0.1 mL Atin Inject under the skin.   GLUCAGON EMERGENCY KIT (HUMAN) INJ Inject 1 mg as directed once as needed. Inject 1 mg, IM as needed for BG for less than 70, Not arousable, conscious or able to swallow.  Hold all diabetic meds until provider authorized resumption.  Remain with patient and keep in bed/chair for safety.  Repeat blood glucose in 15 minutes.   glucose 4 GM chewable  tablet Chew 4 tablets (16 g total) as needed for low blood sugar.   GLUCOSE GEL ORAL Take by mouth. Insta-glucose Gel 77.4%- Give 1 dose by mouth as needed for BG less than 70, Not arousable, conscious or able to swallow.  Hold all diabetic meds until provider authorized resumption.  Remain with patient and keep in bed/chair for safety.  Repeat blood glucose in 15 minutes.   hydrocortisone  2.5 % cream Apply topically every twelve (12) hours as needed. Apply to buttock topically every 12 hours as needed for itching/irritation to surrounding skin   hydrOXYzine  25 MG tablet Commonly known as: ATARAX  Take 0.5 tablets (12.5 mg total) by mouth daily as needed for itching or anxiety.   JUVEN ORAL Take 1 packet by mouth two (2) times a day.   loperamide 2 mg capsule Commonly known as: IMODIUM Take 1 capsule (2 mg total) by mouth every six (6) hours as needed for diarrhea.   MEDICAL SUPPLY ITEM CPAP at 14 cm H2O with heated humidifier with full face mask large, tubing and supplies. See sleep study report 02/12/2015   melatonin 3 mg Tab Take 1 tablet (3 mg total) by mouth every evening.   metFORMIN  1000 MG tablet Commonly known as: GLUCOPHAGE  Take 1 tablet (1,000 mg total) by mouth in the morning and 1 tablet (1,000 mg total) in the evening. Take with meals.   miscellaneous medical supply Misc 1/2 inch lift right shoe   miscellaneous medical supply Misc CPAP at 17 to 18 cmH2O Mask: Vitera FFM size large with heated humidifier, tubing and supplies   miscellaneous medical supply Misc Nebulizer to administer rX   multivitamin with folic acid 400 mcg Tab tablet Take 1 tablet by mouth daily.   mupirocin 2 % ointment Commonly known as: BACTROBAN APPLY TO THE AFFECTED AREA THREE TIMES DAILY   nitroglycerin  0.4 MG SL tablet Commonly known as: NITROSTAT  PLACE 1 TABLET UNDER THE TONGUE AT ONSET OF CHEST PAIN. IF NO RELIEF IN 5 MINUTES, USE 2ND TABLET & CALL EMS. IF NO RELIEF IN 5 MINUTES,  USE 3RD TABLET.   NON FORMULARY Dispense 1 DH offloading shoe to offload the foot when working with physical therapy or sitting in his chair with his feet flat on the floor.  Wear as directed.   NON FORMULARY Dispense #2 offloading boots (Prevalon offloading boot) for offloading when in bed.  Wear as directed.  This is not to be used for walking.   nystatin  100,000 unit/gram cream Commonly known as: MYCOSTATIN  MIX WITH TRIAMCINOLONE  AND APPLY TO THE AFFECTED AREA TWICE DAILY   polyethylene glycol 17 gram packet Commonly known as: MIRALAX  Take 17 g by mouth daily.   silver sulfADIAZINE 1 % cream Commonly known as: SILVADENE, SSD Apply to affected area daily   tamsulosin  0.4 mg capsule Commonly known as: FLOMAX  Take 1 capsule (0.4 mg total) by mouth two (2) times a day.   triamcinolone  0.1 % cream Commonly known as: KENALOG  Apply topically Three (3) times a day.   vilazodone  40 mg Tab Take 1 tablet (40 mg total) by mouth daily.        Allergies: Ceftin [cefuroxime axetil], Ibuprofen, Fluocinolone, Probenecid, Allopurinol analogues, Ciprofloxacin, Doxycycline, Septra [sulfamethoxazole-trimethoprim], Vantin [cefpodoxime], and Zithromax [azithromycin] ______________________________________________________________________ Pending Test Results:   Most Recent Labs: All lab results last 24 hours -  Recent Results (from the past 24 hours)  POCT Glucose   Collection Time: 11/18/23  4:32 PM  Result Value Ref Range   Glucose, POC 404 (HH) 70 - 179 mg/dL  Respiratory Pathogen Panel   Collection Time: 11/18/23  5:04 PM  Result Value Ref Range   Adenovirus Not Detected Not Detected   Coronavirus HKU1 Not Detected Not Detected   Coronavirus NL63 Not Detected Not Detected   Coronavirus 229E Not Detected Not Detected   Coronavirus OC43 PCR Not Detected Not Detected   Metapneumovirus Not Detected Not Detected   Rhinovirus/Enterovirus Not Detected Not Detected   Influenza A Not  Detected Not Detected   Influenza B Not Detected Not Detected   Parainfluenza 1 Not Detected Not Detected   Parainfluenza 2 Not Detected Not Detected   Parainfluenza 3 Not Detected Not Detected   Parainfluenza 4 Not Detected Not Detected   RSV Not Detected Not Detected   Bordetella pertussis Not Detected Not Detected   Bordetella parapertussis Not Detected Not Detected   Chlamydophila (Chlamydia) pneumoniae Not Detected Not Detected   Mycoplasma pneumoniae Not Detected Not Detected   SARS-CoV-2 PCR Not Detected Not Detected  POCT Glucose   Collection Time: 11/18/23  6:25 PM  Result Value Ref Range   Glucose, POC 356 (H) 70 - 179 mg/dL  POCT Glucose   Collection Time: 11/18/23 11:09 PM  Result Value Ref Range   Glucose, POC 317 (H) 70 - 179 mg/dL  POCT Glucose   Collection Time: 11/19/23  8:13 AM  Result Value Ref Range   Glucose, POC 181 (H) 70 - 179 mg/dL  APTT   Collection Time: 11/19/23  9:05 AM  Result Value Ref Range   APTT 34.1 24.8 - 38.4 sec   Heparin  Correlation 0.2   Basic Metabolic Panel   Collection Time: 11/19/23  9:05 AM  Result Value Ref Range   Sodium 136 135 - 145 mmol/L   Potassium 4.5 3.4 - 4.8 mmol/L   Chloride 97 (L) 98 - 107 mmol/L   CO2 32.0 (H) 20.0 - 31.0 mmol/L   Anion Gap 7 5 - 14 mmol/L   BUN 46 (H) 9 - 23 mg/dL   Creatinine 9.05 9.26 - 1.18 mg/dL   BUN/Creatinine Ratio 49    eGFR CKD-EPI (2021) Male 85 >=60 mL/min/1.72m2   Glucose 210 (H) 70 - 179 mg/dL   Calcium  9.6 8.7 - 10.4 mg/dL  CBC   Collection Time: 11/19/23  9:05 AM  Result Value Ref Range   WBC 15.7 (H) 3.6 - 11.2 10*9/L  RBC 4.15 (L) 4.26 - 5.60 10*12/L   HGB 9.8 (L) 12.9 - 16.5 g/dL   HCT 68.4 (L) 60.9 - 51.9 %   MCV 75.8 (L) 77.6 - 95.7 fL   MCH 23.6 (L) 25.9 - 32.4 pg   MCHC 31.1 (L) 32.0 - 36.0 g/dL   RDW 77.7 (H) 87.7 - 84.7 %   MPV 7.5 6.8 - 10.7 fL   Platelet 589 (H) 150 - 450 10*9/L  Magnesium  Level   Collection Time: 11/19/23  9:05 AM  Result Value Ref Range    Magnesium  2.0 1.6 - 2.6 mg/dL    Relevant Studies/Radiology: ECG 12 Lead Result Date: 11/18/2023 ATRIAL FLUTTER WITH VARIABLE A-V BLOCK NONSPECIFIC ST AND T WAVE ABNORMALITY ABNORMAL ECG WHEN COMPARED WITH ECG OF 14-Nov-2023 01:57, PROBABLY NO SIGNIFICANT CHANGE SINCE LAST TRACING Confirmed by Lennie Cough [phone]) on 11/18/2023 10:08:02 PM  XR Chest Portable Result Date: 11/18/2023 EXAM: XR CHEST PORTABLE DATE: 11/18/2023 11:06 AM ACCESSION: [id number] UN DICTATED: 11/18/2023 11:11 AM INTERPRETATION LOCATION: MAIN CAMPUS CLINICAL INDICATION: 78 years old Male with HYPOXEMIA  COMPARISON: Comparison is made to multiple prior chest radiographs, the most recent from November 16, 2023. TECHNIQUE: Portable chest radiograph FINDINGS: HARDWARE: No indwelling support hardware is evident. HEART &  MEDIASTINUM:  Similar prominence of the cardiac silhouette. Postoperative changes associated coronary artery bypass grafting. PULMONARY/PLEURAL SPACE:  Opacification in the bases of the chest likely relate to pleural effusions ( small ) and associated atelectasis. Diffuse interstitial prominence likely reflects edema. Pneumonia is considered less likely, though not excluded. No pneumothorax. OTHER:  Sternal wires are midline and intact.   Minimal change in portable imaging of the chest relative to recent prior imaging. See comments above.   XR Chest Portable Result Date: 11/16/2023 EXAM: XR CHEST PORTABLE ACCESSION: [id number] UN REPORT DATE: 11/16/2023 2:49 PM CLINICAL INDICATION: SHORTNESS OF BREATH  TECHNIQUE: Single View AP Chest Radiograph. COMPARISON: Prior day chest radiograph FINDINGS: S/p sternotomy and CABG. Lungs hypoinflated. Moderate pulmonary edema and small bilateral pleural effusions with bibasilar atelectasis, slightly improved since prior. No pneumothorax.. Stable cardiomegaly.   Moderate pulmonary edema and small bilateral pleural effusions with bibasilar atelectasis, slightly improved since  prior.  XR Chest Portable Result Date: 11/15/2023 EXAM: XR CHEST PORTABLE ACCESSION: [id number] UN REPORT DATE: 11/15/2023 11:06 AM CLINICAL INDICATION: DYSPNEA  TECHNIQUE: Single View AP Chest Radiograph. COMPARISON: 11/15/2023, XR CHEST PORTABLE FINDINGS: Essentially unchanged bilateral interstitial and airspace opacities. Small right pleural effusion is unchanged. No left pleural effusion. No pneumothorax. Stable enlarged cardiac silhouette with surgical changes of CABG. Partially visualized left shoulder arthroplasty.   Unchanged moderate to severe pulmonary edema and small right pleural effusion.  XR Chest Portable Result Date: 11/15/2023 EXAM: XR CHEST PORTABLE ACCESSION: [id number] UN REPORT DATE: 11/15/2023 8:42 AM CLINICAL INDICATION: Dyspnea. TECHNIQUE: Single View AP Chest Radiograph. COMPARISON: Previous day 2:18. FINDINGS: Persistent diffuse pulmonary edema without much change. Small right pleural effusion. Unchanged cardiomediastinal silhouette post CABG and TAVR.   Pulmonary edema without change.  ECG 12 Lead Result Date: 11/14/2023 ATRIAL FIBRILLATION ST & T WAVE ABNORMALITY, CONSIDER ANTERIOR ISCHEMIA ABNORMAL ECG WHEN COMPARED WITH ECG OF 11-Nov-2023 10:40, VENT. RATE HAS DECREASED by  33 bpm NON-SPECIFIC CHANGE IN ST SEGMENT IN INFERIOR LEADS NONSPECIFIC T WAVE ABNORMALITY NOW EVIDENT IN INFERIOR LEADS T WAVE INVERSION NO LONGER EVIDENT IN LATERAL LEADS Confirmed by Antonetta Gull ([id number]) on 11/14/2023 9:38:46 AM  XR Chest Portable Result Date: 11/14/2023 EXAM: XR CHEST PORTABLE ACCESSION: [id number] UN REPORT DATE: 11/14/2023 3:34 AM CLINICAL INDICATION: dyspnea ;  OTHER  TECHNIQUE: Single View AP Chest Radiograph. COMPARISON: 11/12/2023 FINDINGS: Moderate-severe pulmonary edema. Small bilateral pleural effusions with associated atelectasis. No pneumothorax. Post-surgical changes of coronary artery bypass grafting. Cardiac silhouette is enlarged.   Worsening moderate-severe pulmonary edema with  small bilateral pleural effusions.  Cath/Vascular Procedure Result Date: 11/13/2023 FINAL CARDIAC CATHETERIZATION REPORT CONCLUSIONS: - Severe multivessel CAD. Notably, progression of LMCA stenosis to 70-80%, but this does not appear to be acute. - Patent LIMA to LAD - 2/4 bypass grafts patent: patent LIMA to LAD, patent SVG to OM, SVG to diagonal and PDA are known to be occluded. RECOMMENDATIONS: - TR band protocol. - Favor medical management with anti-anginal therapies and reassess for risks/benefits of high-risk PCI. No obvious plaque rupture to explain troponin elevation, this more likely represents post-operative demand ischemia from above-knee amputation on 11/08/23. - Atherosclerotic cardiovascular disease risk factor modification. - Further management per inpatient team. PATIENT NAME: Watt [place] INDICATION: 78 year old male with NSTEMI (non-ST elevated myocardial infarction) (CMS-HCC) [I21.4] PAST MEDICAL HISTORY INCLUDES: hyperlipidemia, hypertension, diabetes mellitus, peripheral arterial disease, prior CABG, prior TAVR, prior PCI PROCEDURE DATE: 2023-11-11 ACCESS SITE: left radial artery PHYSICIANS: Franky Cutting (Attending), Sayyad Kyazimzade (Fellow - Interventional) REFERRING: Lawayne Patience Diagnostic procedures: coronary angiography, LIMA angiogram, graft angiography Contrast Used (ml): 150 DIAGNOSTIC FINDINGS: Coronary Angiography: Coronary Dominance: RCA Left Main: - Large caliber vessel that bifurcates into the LAD and LCx. 70-80% stenosis of the body of the LMCA. Left Anterior Descending: - Large caliber vessel that gives off two diagonal branches before becoming completely occluded in the mid-vessel portion. Severe diffuse proximal disease. Left Circumflex: - Large caliber vessel with moderate diffuse disease and a 100% occlusion in OM1. Right coronary artery: - RCA was very difficult to engage. Non-selective angiography performed using an AR1 diagnostic catheter. Large caliber vessel that  gives off an RV marginal before a 100% chronic occlusion in the mid segment. Grafts: - LIMA to the LAD is patent. - SVG to the first OM is patent. - SVG to the first diagonal is known to be occluded. - SVG to the PDA is known to be occluded. Left Ventriculogram: - Left ventriculogram was not performed.  Did not cross AoV given presence of TAVR prosthesis. Hemodynamics: BP / Ao (mmHg): 128/67  Mean: 90  Technique: The left radial artery was accessed with an angiocath and a 69F sheath was placed. 3mg  of verapamil  was administered via the sheath. After accessing the ascending aorta, heparin  was administered. At the end of the procedure, a TR band was placed to achieve hemostasis. LESION ASSESSMENT / INTERVENTION: No interventions performed Catheters used: 69F JL 3.5, 69F JR 4 Complications: none reported Estimated blood loss: < 30 cc Radiation: Fluoro time (min): 14.7, Air Kerma Dose (mGy): 1035.5, DAP (Gy-cm2): 124.0 I have reviewed the recent history physical and documentation. I personally spent 40 minutes continuously monitoring the patient face to face during the administration of moderate sedation. Independent observer RN was present for the duration of the procedure to assist in patient monitoring. Pre and post sedation activities have been reviewed. I (Dr. Franky Cutting) was present for the entire procedure. Franky FELIX Cutting, MD, [facility] Assistant Professor of Medicine Division of Cardiology [facility]   Echocardiogram Follow Up/Limited Echo Result Date: 11/12/2023 Patient Info Name:     Dylan Bruce Age:     77 years DOB:     03-26-1946 Gender:     Male MRN:     [id number] Accession #:  [id number] UN Account #:     [id number] Ht:     188 cm Wt:     90 kg BSA:     2.17 m2 BP:     125 /     67 mmHg Exam Date:     11/12/2023 3:08 PM Admit Date:     10/26/2023 Exam Type:     ECHOCARDIOGRAM FOLLOW UP/LIMITED ECHO Technical Quality:     Fair Staff Sonographer:     Inge Meline  Reading Fellow:     Ezra JINNY Horner MD Study Info Indications      - nstemi Procedure(s)   Limited 2D, color flow and Doppler transthoracic echocardiogram is performed. Summary   1. Limited study to assess ventricular function.   2. The left ventricle is normal in size with normal wall thickness.   3. The left ventricular systolic function is mildly decreased, LVEF is visually estimated at 40-45%.   4. Aortic valve replacement (29 mm Edwards SAPIEN TAVR, implantation date: 02/21/2023).   5. Aortic valve Doppler indices are consistent with normal prosthetic valve function.   6. The right ventricle is not well visualized but probably normal in size, with probably normal systolic function.   7. There is mild pulmonary hypertension.   8. IVC size and inspiratory change suggest elevated right atrial pressure. (10-20 mmHg). Left Ventricle   The left ventricle is normal in size with normal wall thickness. The left ventricular systolic function is mildly decreased, LVEF is visually estimated at 40-45%. Right Ventricle   The right ventricle is not well visualized but probably normal in size, with probably normal systolic function. Aortic Valve   Aortic valve replacement (29 mm Edwards SAPIEN TAVR, implantation date: 02/21/2023). 02/21/2023 The prosthetic aortic valve is well seated. The prosthetic aortic valve leaflets are not well visualized. Mean gradient: 7 mmHg. There is no regurgitation of the prosthetic aortic valve. Aortic valve Doppler indices are consistent with normal prosthetic valve function. Tricuspid Valve   The tricuspid valve leaflets are normal, with normal leaflet mobility. There is mild tricuspid regurgitation. There is mild pulmonary hypertension. TR maximum velocity: 2.7 m/s  Estimated PASP: 43 mmHg. Aorta   The aorta is not well visualized. Inferior Vena Cava   IVC size and inspiratory change suggest elevated right atrial pressure. (10-20 mmHg). Pericardium/Pleural   There is no pericardial effusion.  Other Findings   Rhythm: Sinus Rhythm. Ventricles ---------------------------------------------------------------------- Name                                 Value        Normal ---------------------------------------------------------------------- LV Dimensions 2D/MM ----------------------------------------------------------------------  IVS Diastolic Thickness (2D)                                1.0 cm       0.6-1.0 LVID Diastole (2D)                  5.3 cm       4.2-5.8  LVPW Diastolic Thickness (2D)                                1.0 cm       0.6-1.0 LVID Systole (2D)  4.1 cm       2.5-4.0 LV Mass Index (2D Cubed)           92 g/m2        49-115  Relative Wall Thickness (2D)                                  0.38        <=0.42 RV Dimensions 2D/MM ---------------------------------------------------------------------- TAPSE                               1.4 cm         >=1.7 Left Ventricular Outflow Tract ---------------------------------------------------------------------- Name                                 Value        Normal ---------------------------------------------------------------------- LVOT Doppler ---------------------------------------------------------------------- LVOT Peak Velocity                 0.9 m/s               LVOT VTI                             15 cm Aortic Valve ---------------------------------------------------------------------- Name                                 Value        Normal ---------------------------------------------------------------------- AV Doppler ---------------------------------------------------------------------- AV Mean Gradient                    7 mmHg               AV VTI                               32 cm               AV DI (VTI)                           0.47 Mitral Valve ---------------------------------------------------------------------- Name                                 Value        Normal  ---------------------------------------------------------------------- MV Diastolic Function ---------------------------------------------------------------------- MV E Peak Velocity                115 cm/s               MV Annular TDI ---------------------------------------------------------------------- MV Septal e' Velocity             4.1 cm/s         >=8.0 MV E/e' (Septal)                      27.8               MV Lateral e' Velocity            7.0 cm/s        >=10.0 MV E/e' (Lateral)  16.5               MV e' Average                     5.5 cm/s               MV E/e' (Average)                     22.2 Tricuspid Valve ---------------------------------------------------------------------- Name                                 Value        Normal ---------------------------------------------------------------------- TV Regurgitation Doppler ---------------------------------------------------------------------- TR Peak Velocity                   2.7 m/s               Estimated PAP/RSVP ---------------------------------------------------------------------- RA Pressure                        15 mmHg           <=5 RV Systolic Pressure               43 mmHg           <36 Report Signatures Finalized by Nunzio Sherrod Ann  MD on 11/12/2023 04:32 PM Resident Ezra JINNY Horner  MD on 11/12/2023 04:01 PM  XR Chest Portable Result Date: 11/12/2023 EXAM: XR CHEST PORTABLE ACCESSION: [id number] UN REPORT DATE: 11/12/2023 8:14 AM CLINICAL INDICATION: POSTSURGICAL STATUS  TECHNIQUE: Single View AP Chest Radiograph. COMPARISON: 11/11/2023 FINDINGS: Post left shoulder arthroplasty. Unchanged moderate pulmonary edema. Small right pleural effusion. No pneumothorax. Post-surgical changes of coronary artery bypass grafting. Cardiac silhouette is enlarged.   Stable chest  ECG 12 Lead Result Date: 11/11/2023 ATRIAL FIBRILLATION VERSUS ATRIAL FLUTTER NONSPECIFIC ST-T WAVE ABNORMALITIES WHEN COMPARED WITH ECG OF  11-Nov-2023 02:46, NO SIGNIFICANT CHANGE WAS FOUND Confirmed by Vinie Dunnings [phone]) on 11/11/2023 2:58:05 PM  ECG 12 Lead Result Date: 11/11/2023 ATRIAL FLUTTER WITH VARIABLE AV CONDUCTION NONSPECIFIC ST-T WAVE ABNORMALITIES WHEN COMPARED WITH ECG OF 09-Nov-2023 08:05, RATE HAS INCREASED AND ST-T WAVE ABNORMALITIES ARE MORE EVIDENT Confirmed by Vinie Dunnings [phone]) on 11/11/2023 2:31:59 PM  XR Chest Portable Result Date: 11/11/2023 EXAM: XR CHEST PORTABLE ACCESSION: [id number] UN REPORT DATE: 11/11/2023 9:04 AM CLINICAL INDICATION: DYSPNEA  TECHNIQUE: Single View AP Chest Radiograph. COMPARISON: 11/10/2023, XR CHEST PORTABLE FINDINGS: Essentially unchanged bilateral hazy airspace and interstitial opacities. Small right pleural effusion. No left pleural effusion. No pneumothorax. Stable enlarged cardiac silhouette with surgical changes of CABG.   Moderate pulmonary edema with a small right pleural effusion.  XR Chest Portable Result Date: 11/11/2023 EXAM: XR CHEST PORTABLE ACCESSION: [id number] UN REPORT DATE: 11/11/2023 8:02 AM CLINICAL INDICATION: SHORTNESS OF BREATH  TECHNIQUE: Single View AP Chest Radiograph. COMPARISON: Same day prior chest radiograph FINDINGS: Unchanged support devices. Stable extensive pulmonary edema and small bilateral pleural effusions (right greater than left). No pneumothorax. Stable enlarged cardiac silhouette. S/p sternotomy and CABG.   Stable extensive pulmonary edema and small bilateral pleural effusions (right greater than left).  XR Chest Portable Result Date: 11/10/2023 EXAM: XR CHEST PORTABLE ACCESSION: [id number] UN REPORT DATE: 11/10/2023 12:17 PM CLINICAL INDICATION: DYSPNEA  TECHNIQUE: Single View AP Chest Radiograph. COMPARISON: 07/23/2023, XR CHEST PORTABLE FINDINGS: Increasing hazy perihilar opacities and diffuse interstitial opacities. No pleural effusion or pneumothorax. Stable enlarged  cardiac silhouette with surgical changes.   Moderate pulmonary edema.  ECG 12  Lead Result Date: 11/09/2023 ATRIAL FLUTTER WITH VARIABLE A-V BLOCK ST & T WAVE ABNORMALITY, CONSIDER LATERAL ISCHEMIA ABNORMAL ECG WHEN COMPARED WITH ECG OF 29-Aug-2023 11:18, T WAVE INVERSION NOW EVIDENT IN LATERAL LEADS Confirmed by Von Shawl [phone]) on 11/09/2023 11:12:16 PM  CT Pelvis W  Contrast Result Date: 10/30/2023 EXAM: CT PELVIS W  CONTRAST ACCESSION: [id number] UN REPORT DATE: 10/30/2023 3:26 PM CLINICAL INDICATION: rule out prostatitis. Per discussion with urology consult, concern for prostatic abscess. COMPARISON: CT abdomen and pelvis 11/23/2018 TECHNIQUE: A helical CT scan of the pelvis was obtained with IV contrast from the iliac crests through the pubic symphysis. Images were reconstructed in the axial plane. Coronal and sagittal reformatted images were also provided for further evaluation. FINDINGS: BLADDER: Moderate bladder wall thickening, more prominent at the dome of the bladder (4:68) and mild perivesicular stranding. PELVIC/REPRODUCTIVE ORGANS: Normal size prostate. Limit evaluation for subtle inflammatory change secondary to modality and streak artifact from the left femur fixation hardware. No discrete organizing fluid collection in the prostate. GI TRACT: No dilated or thick walled loops of bowel. Normal appendix (2:35). PERITONEUM/RETROPERITONEUM AND MESENTERY: No free air or fluid. LYMPH NODES: Mildly prominent 1.1 cm left external iliac node (2:53), and mildly prominent subcentimeter right external iliac nodes. VESSELS: The aorta is normal in caliber with advanced calcified atherosclerosis.  BONES AND SOFT TISSUES: Left femoral neck fracture with partially imaged left femoral fixation hardware. L5-S1 fusion.   -No organizing fluid collection in the prostate as clinically questioned, though evaluation for subtle phlegmonous change is limited secondary to streak artifact from patient's hip hardware. -Moderate bladder wall thickening most prominent at the bladder dome,  indeterminate. Recommend correlation with urinalysis and clinical symptoms for acute cystitis. Underlying mass cannot be excluded. -Mildly prominent bilateral external iliac nodes, indeterminate.  Echocardiogram W Colorflow Spectral Doppler Result Date: 10/29/2023 Patient Info Name:     Dylan Bruce Age:     77 years DOB:     11-03-1946 Gender:     Male MRN:     [id number] Accession #:     [id number] UN Account #:     [id number] Ht:     188 cm Wt:     79 kg BSA:     2.02 m2 BP:     128 /     69 mmHg Exam Date:     10/29/2023 2:19 PM Admit Date:     10/26/2023 Exam Type:     ECHOCARDIOGRAM W COLORFLOW SPECTRAL DOPPLER Technical Quality:     Fair Staff Sonographer:     Burnard Brasil Reading Fellow:     Arthea GORMAN Ehrich Study Info Indications      - eval endocarditis Procedure(s)   Complete two-dimensional, color flow and Doppler transthoracic echocardiogram is performed. Summary   1. Technically difficult study due to chest wall/lung interference.   2. The left ventricle is normal in size with normal wall thickness.   3. The left ventricular systolic function is normal, LVEF is visually estimated at > 55%.   4. Aortic valve replacement (29 mm Edwards SAPIEN TAVR, implantation date: 02/21/2023).   5. Aortic valve Doppler indices are consistent with normal prosthetic valve function.   6. The right ventricle is normal in size, with normal systolic function. Recommendations   * Consider additional imaging, such as TEE, if clinical concern remains for endocarditis. Left Ventricle   The left ventricle is normal in size with  normal wall thickness. The left ventricular systolic function is normal, LVEF is visually estimated at > 55%. Left ventricular diastolic function cannot be accurately assessed. Right Ventricle   The right ventricle is normal in size, with normal systolic function. Left Atrium   The left atrium is upper normal in size. Right Atrium   The right atrium is normal in size. Aortic Valve   Aortic  valve replacement (29 mm Edwards SAPIEN TAVR, implantation date: 02/21/2023). 02/21/2023 The prosthetic aortic valve is well seated. The prosthetic aortic valve leaflets are not well visualized. Peak AV transvalvular velocity:  2.1 m/s. Mean gradient: 11 mmHg. Doppler velocity index: 0.61. LVOT diameter:  2.1 cm. LVOT stroke volume index: 44 ml/m2. AV estimated effective orifice area (by continuity equation):  1.9 cm2. There is no regurgitation of the prosthetic aortic valve. Aortic valve Doppler indices are consistent with normal prosthetic valve function. AV Doppler velocity ratio (dimensionless index):  0.61. Mitral Valve   The mitral valve leaflets are mildly thickened with normal leaflet mobility. There is mild mitral valve regurgitation. Tricuspid Valve   The tricuspid valve leaflets are normal, with normal leaflet mobility. There is no significant tricuspid regurgitation. The pulmonary systolic pressure cannot be estimated due to insufficient TR signal. Pulmonic Valve   The pulmonic valve is normal. There is no significant pulmonic regurgitation. There is no evidence of a significant transvalvular gradient. Aorta   The aorta is normal in size in the visualized segments. Inferior Vena Cava   The IVC is not well visualized precluding the ability to accurate assess right atrial pressure. Pericardium/Pleural   There is no pericardial effusion. Other Findings   Rhythm: Atrial Fibrillation. Ventricles ---------------------------------------------------------------------- Name                                 Value        Normal ---------------------------------------------------------------------- LV Dimensions 2D/MM ----------------------------------------------------------------------  IVS Diastolic Thickness (2D)                                1.2 cm       0.6-1.0 LVID Diastole (2D)                  4.6 cm       4.2-5.8  LVPW Diastolic Thickness (2D)                                1.1 cm       0.6-1.0 LVID Systole  (2D)                   2.4 cm       2.5-4.0 LVOT Diameter                       2.1 cm               LV Mass Index (2D Cubed)           95 g/m2        49-115  Relative Wall Thickness (2D)                                  0.48        <=0.42 RV Dimensions 2D/MM ----------------------------------------------------------------------  RV Basal Diastolic Dimension                           4.0 cm       2.5-4.1 Atria ---------------------------------------------------------------------- Name                                 Value        Normal ---------------------------------------------------------------------- LA Dimensions ---------------------------------------------------------------------- LA Dimension (2D)                   6.5 cm       3.0-4.1 LA Volume Index (4C A-L)        38.26 ml/m2               RA Dimensions ---------------------------------------------------------------------- RA Area (4C)                      18.9 cm2        <=18.0 RA Area (4C) Index              9.4 cm2/m2               RA ESV Index (4C MOD)             23 ml/m2         18-32 Left Ventricular Outflow Tract ---------------------------------------------------------------------- Name                                 Value        Normal ---------------------------------------------------------------------- LVOT 2D ---------------------------------------------------------------------- LVOT Diameter                       2.1 cm               LVOT Area                          3.5 cm2               LVOT Doppler ---------------------------------------------------------------------- LVOT Peak Velocity                 1.3 m/s               LVOT VTI                             26 cm               LVOT Stroke Volume                   89 ml               LVOT SI                           44 ml/m2 Aortic Valve ---------------------------------------------------------------------- Name                                 Value        Normal  ---------------------------------------------------------------------- AV Doppler ---------------------------------------------------------------------- AV Peak Velocity                   2.1  m/s               AV Peak Gradient                   18 mmHg               AV Mean Gradient                   11 mmHg               AV VTI                               46 cm               AV Area (Cont Eq VTI)              1.9 cm2         >=3.0 AV Area Index (Cont Eq VTI)     1.0 cm2/m2               AV Area (Cont Eq Vel)              2.1 cm2               AV Area Index (Cont Eq Vel)     1.0 cm2/m2               AV DI (Vel)                           0.61               AV DI (VTI)                           0.56 Pulmonic Valve ---------------------------------------------------------------------- Name                                 Value        Normal ---------------------------------------------------------------------- PV Doppler ---------------------------------------------------------------------- PV Peak Velocity                   1.2 m/s Aorta ---------------------------------------------------------------------- Name                                 Value        Normal ---------------------------------------------------------------------- Ascending Aorta ---------------------------------------------------------------------- Ao Root Diameter (2D)               3.7 cm               Ao Root Diam Index (2D)          1.8 cm/m2 Report Signatures Finalized by Fairy Beverley Seton  MD on 10/29/2023 05:50 PM Resident Arthea GORMAN Ehrich on 10/29/2023 03:44 PM  MRI Lower Extremity Non-Joint Right W Wo Contrast Result Date: 10/29/2023 EXAM: MRI LOWER EXTREMITY NON-JOINT RIGHT W WO CONTRAST DATE: 10/29/2023 10:05 AM ACCESSION: [id number] UN DICTATED: 10/29/2023 12:14 PM INTERPRETATION LOCATION: MAIN CAMPUS CLINICAL INDICATION: 78 years old 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 MRI right foot  09/01/2023. TECHNIQUE: MRI of the right forefoot and midfoot was performed before and after administration of IV contrast using a local coil.  Multisequence, multiplanar images were obtained. FINDINGS:  Cortical thinning and destruction, T1 hypointensity, patchy bone marrow edema and enhancement is seen involving the fifth metatarsal base and the shaft (series 8 image 17 and series 4 image 6). Peripherally enhancing fluid collection present at the lateral plantar soft tissues of the midfoot and extends up to the overlying soft tissue wound at the lateral foot, measures about 4.5 x 1 x 2.1 cm (Series 12 image 44 and series 11 image 9). This collection surrounds the abductor digiti minimi tendon (series 12 image 39). Severe first MTP joint osteoarthrosis with joint space loss, cortical irregularity, subchondral bone marrow edema and enhancement and osteophytes. Mild subchondral bone marrow edema at the tarsometatarsal, naviculocuneiform and talonavicular joints compatible with overlying cartilage loss. Diffuse foot muscle edema and atrophy diffuse subcutaneous and soft tissue edema.   MRI of the right forefoot without and with IV contrast- 1.  Cortical thinning and destruction with bone marrow edema and enhancement involving the fifth metatarsal consistent with osteomyelitis. Adjacent soft tissue irregularity, edema and enhancement present. Peripherally enhancing fluid collection present communicating with the lateral soft tissue ulceration to the lateral plantar aspect of the midfoot. 2.  Severe first MTP joint osteoarthrosis. 3.  Mild midfoot osteoarthrosis. Diffuse foot muscle edema and atrophy. Findings may be related to chronic neuropathic changes.   ______________________________________________________________________ Discharge Instructions:  Activity Instructions     Activity as tolerated              Follow Up instructions and Outpatient Referrals    Call MD for:  difficulty breathing,  headache or visual disturbances     Call MD for:  persistent nausea or vomiting     Call MD for:  severe uncontrolled pain     Call MD for:  temperature >38.5 Celsius     Discharge instructions       Appointments which have been scheduled for you    Nov 23, 2023 11:00 AM (Arrive by 10:45 AM) RETURN WOUND with Andrez Beverley Mains, PA [facility] WOUND [address] [place] [facility]) [address] [place] [address] [phone]     Dec 07, 2023 1:00 PM (Arrive by 12:45 PM) POST OP with Augustine Lose, MD [facility] SURGERY [address] ([place]) [address] [place] [place] [address] [phone]        ______________________________________________________________________ Discharge Day Services: BP 113/62   Pulse 68   Temp 36.7 C (98.1 F) (Oral)   Resp 16   Ht 188 cm (6' 2)   Wt 76.2 kg (167 lb 15.9 oz)   SpO2 100%   BMI 21.57 kg/m   Pt seen on the day of discharge and determined appropriate for discharge.  Condition at Discharge: fair  Length of Discharge: I spent greater than 30 mins in the discharge of this patient.

## 2023-12-11 ENCOUNTER — Ambulatory Visit: Payer: Commercial Managed Care - PPO | Attending: Internal Medicine | Admitting: Internal Medicine

## 2023-12-11 NOTE — Progress Notes (Deleted)
  Cardiology Office Note:  .   Date:  12/11/2023  ID:  Dylan Bruce, DOB Apr 30, 1946, MRN [id number] PCP: Rosan Mix, MD  [facility] Providers Cardiologist:  Alvan Ronal BRAVO, MD Structural Heart:  Lonni Cash, MD{ Click to update primary MD,subspecialty MD or APP then REFRESH:1}   History of Present Illness: .   Dylan Bruce is a 78 y.o. male CAD s/p 4V CABG 2003 with PCI of SVG 2010, chronic diastolic CHF, HTN, hyperlipidemia, diabetes, sleep apnea, anxiety, depression, gout, PAD, neuropathy, psoriatic arthritis, chronic kidney disease stage III, GERD and severe aortic stenosis s/p TAVR 02/21/2023 referral for FU. I have not seen him in the outpatient setting, only briefly in the hospital. He had CHF symptoms post TAVR. He also developed atrial flutter. He was admitted 02/19/2023 ***  ROS: ***  Studies Reviewed: .        *** Risk Assessment/Calculations:   {Does this patient have ATRIAL FIBRILLATION?:[phone]} No BP recorded.  {Refresh Note OR Click here to enter BP  :1}***       Physical Exam:   VS:  There were no vitals taken for this visit.   Wt Readings from Last 3 Encounters:  08/13/23 184 lb (83.5 kg)  07/08/23 214 lb 15.2 oz (97.5 kg)  06/09/23 198 lb 13.7 oz (90.2 kg)    GEN: Well nourished, well developed in no acute distress NECK: No JVD; No carotid bruits CARDIAC: ***RRR, no murmurs, rubs, gallops RESPIRATORY:  Clear to auscultation without rales, wheezing or rhonchi  ABDOMEN: Soft, non-tender, non-distended EXTREMITIES:  No edema; No deformity   ASSESSMENT AND PLAN: .   ***    {Are you ordering a CV Procedure (e.g. stress test, cath, DCCV, TEE, etc)?   Press F2        :[id number]}  Dispo: ***  Signed, Alvan Ronal BRAVO, MD

## 2024-02-07 NOTE — Progress Notes (Deleted)
 [facility]   MULTIDISCIPLINARY HEART VALVE CLINIC                                     Cardiology Office Note:    Date:  02/07/2024   ID:  Dylan Bruce, DOB [birth year], MRN [id number]  PCP:  Dylan Qua, MD  [facility] Cardiologist:  Dylan Fus, MD  [facility] Structural heart: Dylan Carrow, MD [facility] Electrophysiologist:  None   Referring MD: Dylan Qua, MD   No chief complaint on file. ***  History of Present Illness:    Dylan Bruce is a 78 y.o. 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    Patient was remotely followed by Dr. Jens Bruce and then [facility] and now follows with Dr. Carolan Bruce. He has a long history of CAD s/p remote CABG x4 with LIMA to LAD, SVG to OM, SVG to 1st Diag, and SVG to distal RCA  in 2003 and subsequent PCI with DES of SVG to PDA in 2010. He also has a history of PAD with self reported severe lower extremity disease bilaterally. He follows with Vascular Surgery in [place], [place] and reportedly has had multiple interventions (last one was about 1 year ago). Patient was recently admitted to [facility] in 01/2023 for NSTEMI and acute on chronic diastolic CHF. High-sensitivity troponin peaked at 11,393. Echo showed LVEF of 55% with hypokinesis of mid/distal lateral walls and mild LVH, normal RV, mild left atrial enlargement, mild MR, and severe aortic stenosis with mean gradient of 32 mmHg and AVA (VTI) of 0.8 cm^2. R/LHC showed 2/4 patent grafts (LIMA to LAD and SVG to OM) with known occlusion of SVG to distal RCA and SVG to 1st Diag. There were no  focal targets for PCI and continued medical therapy was recommended for CAD. He was aggressively diuresed with high dose IV Lasix and Metolazone and then transitioned to Torsemide. He was noted to have intermittent 2nd degree Mobitz type I and II during admission and beta-blocker was held. He was referred to Dr. Clifton Bruce for his severe aortic stenosis and TAVR was recommended. This was scheduled for 02/21/2023. However, prior to he started having signs of decompensated CHF again with shortness of breath and weight gain as well as elevated Pro-BNP so he was admitted on 02/19/2023 for IV diuresis prior to procedure. He was also noted to be in new onset atrial flutter and was started on IV Heparin. He underwent successful TAVR with a 29 mm Edwards Sapien 3 THV on 02/21/2023. He was started on Eliquis given new atrial flutter after procedure and continued on Plavix given underlying vascular disease but Aspirin was stopped. Home Torsemide was increased at discharge.   Patient was seen in the [facility] ED on 02/23/2023 for further evaluation of hematuria. Urinalysis showed large blood, >30 RBC, 4 WBC, few bacteria, 100 mg/dL of protein, and 027 mg/dL of glucose.  Hematuria was felt to be due to irritation from Foley during recent admission. Head CT was also performed due to reports of a  brief episode of confusion and was unremarkable. Patient reported this was not new for him and he has had confusion for years. He was advised to follow-up with his PCP.   Last seen in our office on 02/26/23 with Dylan Skiff PA-C and doing well.   Today the patient presents to clinic for follow up. He has had a marked clinical improvement since TAVR with resolution of dyspnea on exertion and orthopnea since surgery. No more hematuria. Been working out in heat which makes him a little tired. Has some mild swelling in left leg. No CP or SOB. No dizziness or syncope. No blood in stool or urine. No palpitations.     Past Medical History:   Diagnosis Date   Aortic stenosis    ARTHRITIS    ASTHMA    CAD (coronary artery disease)    CHF (congestive heart failure) (HCC)    DM    GERD    HYPERLIPIDEMIA    Hypertension    PSORIASIS    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 Clifton Bruce and Dr. Delia Chimes   SLEEP APNEA    SPINA BIFIDA      Current Medications: No outpatient medications have been marked as taking for the 02/08/24 encounter (Appointment) with [facility].      ROS:   Please see the history of present illness.    All other systems reviewed and are negative.  EKGs       Risk Assessment/Calculations:   {Does this patient have ATRIAL FIBRILLATION?:[phone]}   {This patient may be at risk for Amyloid. He has one or more dx on the problem list or PMH from the following list - Abnormal EKG, CHF, Aortic Stenosis, Proteinuria, LVH, Carpal Tunnel Syndrome, Biceps Tendon Rupture, Syncope. See list below or review PMH.  Diagnoses From Problem List           Noted     Acute CHF (congestive heart failure) (HCC) 02/19/2023     Acute on chronic diastolic CHF (congestive heart failure) (HCC) 01/07/2023     Acute on chronic systolic heart failure (HCC) 01/11/2023     Severe aortic stenosis 08/14/2012    Click HERE to open Cardiac Amyloid Screening SmartSet to order screening OR Click HERE to defer testing for 1 year or permanently :1}    Physical Exam:    VS:  There were no vitals taken for this visit.    Wt Readings from Last 3 Encounters:  08/13/23 184 lb (83.5 kg)  07/08/23 214 lb 15.2 oz (97.5 kg)  06/09/23 198 lb 13.7 oz (90.2 kg)     GEN: Well nourished, well developed in no acute distress NECK: No JVD CARDIAC: ***RRR, no murmurs, rubs, gallops RESPIRATORY:  Clear to auscultation without rales, wheezing or rhonchi  ABDOMEN: Soft, non-tender, non-distended EXTREMITIES:  No edema; No deformity.  Groin sites clear without  hematoma or ecchymosis. ****  ASSESSMENT:    No diagnosis found.  PLAN:    In order of problems listed above:  Severe AS s/p TAVR:  -- Echo today shows EF ***, normally functioning TAVR with a mean gradient of *** mm hg and *** PVL.  -- NYHA class *** symptoms.  -- Continue on Eliquis and plavix -- He has Amoxicillin 2g for SBE prophylaxis  CAD s/p CABG:   -- Last R/LHC in 01/2023 showed 2/4 patent grafts (LIMA to LAD and SVG to OM) with known occlusion of SVG  to distal RCA and SVG to 1st Diag.  -- There were no focal targets for PCI and continued medical therapy was recommended.  -- Continue Imdur 30mg  daily, Plavix monotherapy, statin/ Zetia.   Chronic Diastolic CHF:  -- Appears euvolemic.  -- Continue on Torsemide 40mg  daily and Jardiance 10mg  daily.    Persistent Atrial Flutter:  -- This was newly diagnosed after TAVR.  -- Rate well controlled off AV nodal blocking agents.  -- Continue Eliquis 5 twice daily. Remains in atrial flutter today.  -- I offered to set up DCCV in the past but he had declined.   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  -- Continue Plavix monotherapy.  --Continue statin/ Zetia.   HTN: BP well controlled. No changes made   HLD: lipid panel in 01/2023: Total Cholesterol 91, Triglycerides 149, HDL 24, LDL 37. Continue Lipitor 80mg  daily and Zetia 10mg  daily.   CKD Stage III: baseline creatinine around 1.7. Creatinine stable at 1.30 on 04/10/23   Hematuria: this resolved.     Medication Adjustments/Labs and Tests Ordered: Current medicines are reviewed at length with the patient today.  Concerns regarding medicines are outlined above.  No orders of the defined types were placed in this encounter.  No orders of the defined types were placed in this  encounter.   There are no Patient Instructions on file for this visit.   Signed, Cline Crock, PA-C  02/07/2024 4:18 PM    [facility]

## 2024-02-08 ENCOUNTER — Other Ambulatory Visit (HOSPITAL_COMMUNITY): Payer: Self-pay | Admitting: Physician Assistant

## 2024-02-08 ENCOUNTER — Ambulatory Visit: Payer: Medicare Other | Attending: Internal Medicine

## 2024-02-08 ENCOUNTER — Ambulatory Visit: Payer: Commercial Managed Care - PPO

## 2024-02-08 DIAGNOSIS — Z952 Presence of prosthetic heart valve: Secondary | ICD-10-CM

## 2024-02-13 ENCOUNTER — Telehealth: Payer: Self-pay | Admitting: Physician Assistant

## 2024-02-13 NOTE — Telephone Encounter (Signed)
  [facility]ASCULAR [facility]ENTER   MULTIDISCIPLINARY HEART VALVE TEAM  Patient due for 1 year TAVR evaluation and echo. 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 He is followed by the wound center and followed by [facility] cardiology. Thankfully, he is doing well from a cardiac standpoint with NYHA class I symptoms, although not very active. Echo will be deferred at this time. Continue follow up with [facility].   [place] Cardiomyopathy Questionnaire     02/13/2024   11:56 AM 05/04/2023    9:47 AM 04/09/2023   11:24 AM  KCCQ-12  1 a. Ability to shower/bathe Not at all limited Not at all limited Not at all limited  1 b. Ability to walk 1 block Not at all limited Not at all limited Not at all limited  1 c. Ability to hurry/jog Other, Did not do Other, Did not do Other, Did not do  2. Edema feet/ankles/legs Never over the past 2 weeks Never over the past 2 weeks Never over the past 2 weeks  3. Limited by fatigue At least once a day Never over the past 2 weeks Never over the past 2 weeks  4. Limited by dyspnea Never over the past 2 weeks Never over the past 2 weeks Never over the past 2 weeks  5. Sitting up / on 3+ pillows Never over the past 2 weeks Never over the past 2 weeks Never over the past 2 weeks  6. Limited enjoyment of life Slightly limited Slightly limited Not limited at all  7. Rest of life w/ symptoms Mostly satisfied Mostly satisfied Completely satisfied  8 a. Participation in hobbies N/A, did not do for other reasons Slightly limited Did not limit at all  8 b. Participation in chores N/A, did not do for other reasons Did not limit at all Did not limit at all  8 c. Visiting family/friends N/A, did not do for other reasons Did not limit at all Did not limit at all      Cline Crock PA-C  [facility]

## 2024-04-25 ENCOUNTER — Encounter (HOSPITAL_COMMUNITY): Payer: Self-pay

## 2024-04-25 ENCOUNTER — Inpatient Hospital Stay (HOSPITAL_COMMUNITY): Admit: 2024-04-25 | Admitting: Family Medicine

## 2024-04-28 ENCOUNTER — Inpatient Hospital Stay: Admit: 2024-04-28 | Admitting: Internal Medicine

## 2024-04-28 ENCOUNTER — Encounter (HOSPITAL_COMMUNITY): Payer: Self-pay

## 2024-05-16 NOTE — Discharge Summary (Signed)
 " Physician Discharge Summary [facility] [address] [place] [address] Dept: [phone] Loc: [phone]   Identifying Information:  Dylan Bruce [birth year] [id number]  Primary Care Physician: Rosan Cheryal Heinz, MD  Code Status: DNR and DNI  Admit Date: 05/09/2024  Discharge Date: 05/16/2024   Discharge To: Home Hospice  Discharge Service: [facility] - Hospitalists   Discharge Attending Physician: Lindia Graves Myrex, MD  Discharge Diagnoses: Principal Problem:   Acute encephalopathy Active Problems:   Mild intermittent asthma without complication (HHS-HCC)   Ischemic heart disease due to coronary artery obstruction      Gastroesophageal reflux disease without esophagitis   Type 2 diabetes mellitus with vascular disease      Essential hypertension   OSA on CPAP   Type 2 diabetes mellitus with diabetic nephropathy, with long-term current use of insulin       Diabetic polyneuropathy associated with type 2 diabetes mellitus      Psoriasis   Hypertensive heart disease with chronic diastolic congestive heart failure      Grade II diastolic dysfunction   Anemia   Chronic gout of foot   Atrial flutter      Adjustment disorder   Osteomyelitis      COVID-19   Outpatient Provider Follow Up Issues:  [ ]  Patient discharged to home hospice  Hospital Course:  Dylan Bruce is a 78 y.o. male with PMHX of CAD s/p CABG and TAVR, PAD with right AKA, insulin -dependent diabetes mellitus, paroxysmal atrial fibrillation on anticoagulation, and chronic wounds with recent diagnosis of left heel osteomyelitis currently on IV antibiotic who presented with altered mental status.    ## Goals of care: Lengthy goals of care discussion with patient, wife, daughter and son.  During the time of the conversation, patient was much more clear, alert and oriented and able to participate.  Family is all in agreement that based on his current trajectory, recent  hospitalization and strong desire to maximize his quality of life at home with those he loves that home hospice would be the best option for him. We had a long discussion regarding continuing antibiotics based on his side effects of them and considering that he had a debridement with < 1 growth and already completed 2.5 weeks of antibiotics. I also discussed with ID who agreed it was very reasonable to discontinue them at this junction based on his goals. Family decided to stop antibiotics and continue wound care for the heel. It was also noted that patient's renal function was starting to significantly decline. I discussed this with the wife at how we could keep him in the hospital and treat him aggressively to try and support his kidneys but again this did not align to their goals and they would absolutely not want to pursue and invasive measures such as dialysis.    ## Acute Encephalopathy Presented with encephalopathy after recent discharge from OSH for osteo. Was initially thought to be due to Covid-19 infection; however, most likely multifactorial with COVID, cefepime toxicity, constipation, delirium.  He significantly improved after having a large bowel movement and stopping cefepime but on day of discharge was more confused, likely due to uremia in setting of his acute renal failure.    ## Depression  Has long history of depression which he has prescribed Vilazodone  40 mg daily. However, Vilazodone  is not on formulary. Asked wife to bring home Vilazodone  but rx bottle had expired and have not received refilled medication. Psychiatry virtually consulted due to  passive SI, thought likely 2/2 delirium.  He was started on Lexapro to help with serotonin withdrawal and resumed on his home med at DC.   ## AKI: Cr slowly trended up during hospitalization, likely prerenal but also ATN due to his nephrotoxic meds, infection, antibiotics, etc. He was intermittently bolused with IVF without improvement. See GOC  discussion as above.    ## Scrotal Swelling: Scrotal US  with extensive scrotal edema which may be infectious or inflammatory but no sonographic features of Fournier's gangrene. Concern for fungal infection. He was treated with diflucan and nystatin  as well as scrotal sling.    ## R Arm Swelling  thrombophlebitis: PVL without DVT, continued supportive care   ## Covid-19 Infection: S/p Remdesivir x 4 days (7/5-7/7). Discharged on RA.   ## Constipation   Stool Burden: Found to have rectal stool ball measuring 8.4 cm on KUB. S/p enema x2 and was having regular bowel movements on bowel regimen.    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  We are holding cefepime and his mental status has improved, I had a very long discussion with family regarding options with his listed fluoroquinolone allergy.  See discussion above, stopped antibiotics and removed PICC line. Discussed with Podiatry who coordinated with Hospice about his wound care / staple removal.   ## Acute on Chronic anemia Hemoglobin 6.9 upon admission. Baseline ~7.3. S/p 1u of pRBCs with improvement of Hgb to 7.9.  Likely anemia of chronic disease in the setting of chronic osteomyelitis. No evidence of active bleeding.    ## HFimpEF  HTN Most recent TTE 04/30/2024 with LVEF 40-45%, grade 3 diastolic dysfunction, mild-moderate MV regurgitation. His meds were held in setting of low BP and AKI.  - Continued home metoprolol  25 mg daily - Held home losartan 25 mg daily - Held home Spirolactone 25 mg daily iso soft BP's - Held home Torsemide  20 mg daily iso soft BP's   ## Insulin -dependent type 2 diabetes mellitus Last A1c 7.5% (04/29/24).  Medication adjusted frequently due to bouts of hypo and hyperglycemia. Discharged  on Lantus  15 units and SS.    ## Recent Type 2 MI  H/o TAVR/CABG, history of prior MI: Recent hospitalization for NSTEMI and underwent cath. Had no chest pain here. Continued statin, eliquis  and plavix .    ## Paroxysmal atrial fibrillation/flutter: Rate controlled, continued metoprolol  and eliquis .    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  HLD: Continued statin, ezetimibe , febuxostat    ## OSA: CPAP nightly    ## GERD: Continued home Pantoprazole  40 mg daily   Procedures:  PICC removal ______________________________________________________________________ Discharge Medications:   Your Medication List     STOP taking these medications    ascorbic acid (vitamin C) 500 MG tablet Commonly known as: VITAMIN C   cefepime 2 g in sodium chloride  0.9 % 0.9 % 100 mL IVPB   DAPTOmycin 600 mg   diphenhydrAMINE  25 mg capsule Commonly known as: BENADRYL    FeroSul 325 mg (65 mg iron ) tablet Generic drug: ferrous sulfate    hydrocortisone  2.5 % cream   loperamide 2 mg capsule Commonly known as: IMODIUM   losartan 25 MG tablet Commonly known as: COZAAR   multivitamin with folic acid 400 mcg Tab tablet   OZEMPIC 1 mg/dose (4 mg/3 mL) Pnij injection Generic  drug: semaglutide   spironolactone 25 MG tablet Commonly known as: ALDACTONE   zinc  sulfate 220 mg (50 mg elemental zinc ) capsule Commonly known as: ZINCATE       START taking these medications    clotrimazole 1 % cream Commonly known as: LOTRIMIN Apply topically two (2) times a day.   QUEtiapine 25 MG tablet Commonly known as: SEROQUEL Take 0.5 tablets (12.5 mg total) by mouth two (2) times a day as needed.       CHANGE how you take these medications    HumaLOG  KwikPen Insulin  100 unit/mL injection pen Generic drug: insulin  lispro Inject under the skin Four (4) times a day with a meal and nightly. sliding scale; 151-200= 2 units; 201-250= 4 units; 251-300= 6 units; 301-350= 8 units; 351-400= 10  units.  If >400 give 10 units and re check in 1 hour, if still > 400 notify MD.  Before meals and at bedtime What changed: Another medication with the same name was removed. Continue taking this medication, and follow the directions you see here.   tamsulosin  0.4 mg capsule Commonly known as: FLOMAX  Take 1 capsule (0.4 mg total) by mouth daily. What changed: when to take this   torsemide  20 MG tablet Commonly known as: DEMADEX  Take 1 tablet (20 mg total) by mouth daily as needed. What changed:  when to take this reasons to take this       CONTINUE taking these medications    acetaminophen  325 MG tablet Commonly known as: TYLENOL  Take 3 tablets (975 mg total) by mouth every eight (8) hours.   apixaban  5 mg Tab Commonly known as: ELIQUIS  Take 1 tablet (5 mg total) by mouth two (2) times a day.   atorvastatin  80 MG tablet Commonly known as: LIPITOR TAKE 1 TABLET DAILY   azelaic acid 15 % gel Apply 1 Application topically in the morning. Apply small amount to affected area on face.   calcium  polycarbophil 625 mg tablet Commonly known as: FIBERCON Take 1 tablet (625 mg total) by mouth daily.   carboxymethylcellulose sodium 0.25 % Drop Commonly known as: THERATEARS Administer 1 drop to both eyes four (4) times a day as needed.   clobetasol  0.05 % Gel Commonly known as: TEMOVATE  Apply 1 Application topically daily. Apply a small amount to scalp   clopidogrel  75 mg tablet Commonly known as: PLAVIX  Take 1 tablet (75 mg total) by mouth daily.   desonide  0.05 % cream Commonly known as: DESOWEN  Apply topically Two (2) times a day.   docusate sodium  100 MG capsule Commonly known as: COLACE Take 1 capsule (100 mg total) by mouth at bedtime.   esomeprazole 40 MG capsule Commonly known as: NEXIUM TAKE 1 CAPSULE DAILY   ezetimibe  10 mg tablet Commonly known as: ZETIA  Take 1 tablet (10 mg total) by mouth daily.   febuxostat  40 mg tablet Commonly known as:  ULORIC  Take 1 tablet (40 mg total) by mouth daily.   folic acid 400 MCG tablet Commonly known as: FOLVITE Take 1 tablet (400 mcg total) by mouth daily.   FREESTYLE LIBRE 2 SENSOR kit by Other route every fourteen (14) days.   insulin  glargine 100 unit/mL injection Commonly known as: LANTUS  Inject 0.15 mL (15 Units total) under the skin nightly.   loratadine 10 mg tablet Commonly known as: CLARITIN Take 1 tablet (10 mg total) by mouth daily as needed (Itching).   magnesium  oxide 400 mg magnesium  Tab Take 1 tablet by mouth two (2) times  a day.   MEDICAL SUPPLY ITEM CPAP at 14 cm H2O with heated humidifier with full face mask large, tubing and supplies. See sleep study report 02/12/2015   melatonin 3 mg Tab Take 1 tablet (3 mg total) by mouth every evening.   metoPROLOL  succinate 25 MG 24 hr tablet Commonly known as: TOPROL  XL Take 1 tablet (25 mg total) by mouth in the morning.   miscellaneous medical supply Misc 1/2 inch lift right shoe   miscellaneous medical supply Misc CPAP at 17 to 18 cmH2O Mask: Vitera FFM size large with heated humidifier, tubing and supplies   miscellaneous medical supply Misc Nebulizer to administer rX   NON FORMULARY Dispense 1 DH offloading shoe to offload the foot when working with physical therapy or sitting in his chair with his feet flat on the floor.  Wear as directed.   NON FORMULARY Dispense #2 offloading boots (Prevalon offloading boot) for offloading when in bed.  Wear as directed.  This is not to be used for walking.   nystatin  100,000 unit/gram cream Commonly known as: MYCOSTATIN  MIX WITH TRIAMCINOLONE  AND APPLY TO THE AFFECTED AREA TWICE DAILY   polyethylene glycol 17 gram packet Commonly known as: MIRALAX  Take 17 g by mouth daily.   pregabalin  100 MG capsule Commonly known as: LYRICA  Take 1 capsule (100 mg total) by mouth daily.   senna 8.6 mg tablet Commonly known as: SENOKOT Take 1 tablet by mouth daily.   vilazodone   40 mg Tab Take 1 tablet (40 mg total) by mouth daily.       ASK your doctor about these medications    oxyCODONE  5 MG immediate release tablet Commonly known as: ROXICODONE  Take 2 tablets (10 mg total) by mouth every six (6) hours as needed for pain Ask about: Should I take this medication?        Allergies: Ceftin [cefuroxime axetil], Ibuprofen, Fluocinolone, Allopurinol, Probenecid, Allopurinol analogues, Ciprofloxacin, Doxycycline, Septra [sulfamethoxazole-trimethoprim], Silver-calcium  alginate, Vantin [cefpodoxime], and Zithromax [azithromycin] ______________________________________________________________________ Pending Test Results (if blank, then none):   Relevant Studies/Radiology (if blank, then none): PVL Venous Duplex Upper Extremity Right Result Date: 05/13/2024 EXAM: PVL VENOUS DUPLEX UPPER EXTREMITY RIGHT DATE: 05/13/2024 ACCESSION: [id number] [facility] DICTATED: 05/13/2024 4:18 PM INTERPRETATION LOCATION: MAIN CAMPUS CLINICAL INDICATION: 78 years old Male with edema, r/o DVT  COMPARISON: Chest radiograph 05/09/2024 TECHNIQUE: The right internal jugular, subclavian, axillary, brachial, basilic and cephalic veins were evaluated for the presence of intraluminal obstruction.  Transverse views were used to assess venous compressibility. Longitudinal orientation was used for Doppler assessment of hemodynamic flow characteristics.  FINDINGS: The right internal jugular, subclavian, axillary, brachial, proximal basilic and cephalic veins were compressible. Doppler signals were obtained noting normal spontaneity and phasicity. Intraluminal echogenic focus in the right distal basilic vein, which is incompletely compressible. There is adjacent subcutaneous/soft tissue edema. There is loss of physiologic phasicity in the distal basilic vein on spectral Doppler. The contralateral left subclavian vein demonstrated normal spontaneity and phasicity. Partially imaged left upper extremity PICC in the left  subclavian vein.   -No deep vein thrombosis in the right upper extremity. -Superficial venous partial thrombosis of the right distal basilic vein with adjacent soft tissue edema.  CT Head Wo Contrast Result Date: 05/13/2024 EXAM: Computed tomography, head or brain without contrast material. ACCESSION: [id number] [facility] CLINICAL INDICATION: 78 years old Male with AMS  COMPARISON: CT head 08/27/2023 TECHNIQUE: Axial CT images of the head from skull base to vertex without contrast. FINDINGS: There is no midline shift. No mass  lesion. There is no evidence of acute infarct.  The sinuses are pneumatized. Unchanged left sphenoid and maxillary sinus mucous retention cysts. No intracranial hemorrhage or skull fractures. Bilateral carotid siphon and V4 vertebral artery calcifications.   No acute intracranial abnormality.   US  Scrotum Result Date: 05/11/2024 EXAM: US  SCROTUM ACCESSION: [id number] [facility] REPORT DATE: 05/11/2024 3:12 PM CLINICAL INDICATION: 78 years old with testicular swelling/pain COMPARISON: October 30, 2023 CT pelvis otherwise no prior scrotal ultrasound TECHNIQUE:  Ultrasound views of the scrotum were obtained using gray scale and color Doppler imaging. Spectral Doppler imaging was also performed. FINDINGS: TESTICLES: The testes were uniform in echotexture. No focal masses were seen. Appropriate arterial inflow and venous outflow of the testicles was documented with color and spectral Doppler imaging.       Right testicle: Sagittal 3.4 cm; AP 2.9 cm; Transverse 2.4 cm      Left testicle: Sagittal 3.5 cm; AP 3.0 cm; Transverse 2.8 cm EPIDIDYMIS:  Appropriate flow was seen in the epididymal heads.      Right epididymis head: 2.5 cm      Left epididymis head: 3.6 cm OTHER: Small bilateral hydrocele was noted. Mild-moderate scrotal edema withoutRingdown artifact to suggest gas-forming organism.   Extensive scrotal edema which may be infectious or inflammatory. No sonographic features of Fournier's gangrene.  Small hydrocele bilaterally   XR Abdomen 1 View Result Date: 05/11/2024 EXAM: XR ABDOMEN 1 VIEW ACCESSION: [id number] [facility] REPORT DATE: 05/11/2024 12:32 PM CLINICAL INDICATION: 78 years old with DIARRHEA  COMPARISON: May 04, 2024 TECHNIQUE: Supine view of the abdomen, 2 image(s) FINDINGS: Large volume stool burden is seen to the level of the rectum. A rectal stool ball and distention measures up to 8.4 cm in transverse dimension. No free air. Surgical hardware overlies the cardiac vasculature and lower lumbar spine. Fixation screw and intramedullary rod overlies the left hip.   Large volume stool burden with rectal stool ball measuring 8.4 cm. 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 COMPARISON: Chest radiograph 04/26/2024 FINDINGS: Lungs are low in volume with bibasilar atelectasis. Similar degree of mild diffuse bronchial wall and septal thickening, now with fissural thickening. Both costophrenic sulci are blunted. No pneumothorax. Post-surgical changes of coronary artery bypass grafting. Cardiac silhouette is normal in size. Partially imaged left shoulder arthroplasty.   Worsening interstitial pulmonary edema with probable small bilateral pleural effusions.   ______________________________________________________________________ Discharge Instructions:      Other Instructions     Discharge instructions     You were seen in the hospital for confusion which was likely secondary to several different things including COVID, a side effect of your antibiotic cefepime as well as severe constipation.   Your confusion improved as we addressed each of these things.  After discussion with you and your family, it was felt that the best next step for you is to go home with hospice and we have arranged that.  We are recommending could continue the medications listed here but note that your hospice doctor may change these over time  It was a pleasure caring for you at [facility]!          Follow Up instructions  and Outpatient Referrals    Ambulatory Referral to Hospice     Reason for referral: End of life care   Facility Type: Home-based   Did you call Hospice before placing this order?: Yes   Requested follow up plan: You would evaluate and manage.   Discharge instructions       Appointments which have been scheduled for you    May 28, 2024 9:00 AM (Arrive by 8:45 AM) NEW  GENERAL with Roetta Gatha Said, MD [facility] NEPHROLOGY [facility] REGION) [address] [place] [address] [phone]     May 28, 2024 1:30 PM (Arrive by 1:15 PM) HOSPITAL FOLLOW UP with Garnette Lonni Craven, [facility] PODIATRY [place] [facility] REGION) [address] [place] [address] [phone]     May 30, 2024 1:00 PM (Arrive by 12:45 PM) OFFICE VISIT with Delvin LILLETTE Gunner, FNP [facility] INFECTIOUS DISEASES [place] [facility] REGION) [address] [facility] [address] [place] [address] [phone]     Jun 09, 2024 1:00 PM (Arrive by 12:45 PM) VASCULAR ULTRASOUND ARTERIAL DUPLEX LOWER EXTREMITY LEFT with MM PVL RM 3 IMG PVL [place] [facility]) [address] [place] [address] [phone]     Jun 09, 2024 2:45 PM (Arrive by 2:30 PM) RETURN VASCULAR with Augustine Lose, MD [facility] HEART VASCULAR CTR SURGERY [place] [facility] [place]) [address]  [address] [place] [address] [phone]        ______________________________________________________________________ Discharge Day Services: BP 127/62   Pulse 64   Temp 36.3 C (97.3 F)   Resp 19   Ht 188 cm (6' 2)   Wt 82.4 kg (181 lb 10.5 oz)   SpO2 98%   BMI 23.32 kg/m   Pt seen on the day of discharge and determined appropriate for discharge.   Condition at Discharge: stable  Length of Discharge: I spent greater than 30 mins in the discharge of this patient. "

## 2024-06-06 DEATH — deceased
